# Patient Record
Sex: Male | Born: 1980
Health system: Southern US, Community
[De-identification: ages and names within clinical notes are randomized; demographics above are authoritative.]

## PROBLEM LIST (undated history)

## (undated) DIAGNOSIS — M5020 Other cervical disc displacement, unspecified cervical region: Secondary | ICD-10-CM

## (undated) DIAGNOSIS — E119 Type 2 diabetes mellitus without complications: Secondary | ICD-10-CM

## (undated) DIAGNOSIS — G959 Disease of spinal cord, unspecified: Secondary | ICD-10-CM

## (undated) DIAGNOSIS — L409 Psoriasis, unspecified: Secondary | ICD-10-CM

## (undated) DIAGNOSIS — E782 Mixed hyperlipidemia: Secondary | ICD-10-CM

## (undated) DIAGNOSIS — K219 Gastro-esophageal reflux disease without esophagitis: Secondary | ICD-10-CM

## (undated) DIAGNOSIS — J329 Chronic sinusitis, unspecified: Secondary | ICD-10-CM

## (undated) DIAGNOSIS — I10 Essential (primary) hypertension: Secondary | ICD-10-CM

## (undated) DIAGNOSIS — R519 Headache, unspecified: Secondary | ICD-10-CM

## (undated) DIAGNOSIS — M1990 Unspecified osteoarthritis, unspecified site: Secondary | ICD-10-CM

## (undated) DIAGNOSIS — J189 Pneumonia, unspecified organism: Secondary | ICD-10-CM

## (undated) DIAGNOSIS — A159 Respiratory tuberculosis unspecified: Secondary | ICD-10-CM

## (undated) DIAGNOSIS — F1010 Alcohol abuse, uncomplicated: Secondary | ICD-10-CM

## (undated) DIAGNOSIS — E1165 Type 2 diabetes mellitus with hyperglycemia: Secondary | ICD-10-CM

## (undated) DIAGNOSIS — R51 Headache: Secondary | ICD-10-CM

## (undated) DIAGNOSIS — I152 Hypertension secondary to endocrine disorders: Secondary | ICD-10-CM

## (undated) DIAGNOSIS — E1159 Type 2 diabetes mellitus with other circulatory complications: Secondary | ICD-10-CM

## (undated) HISTORY — DX: Type 2 diabetes mellitus without complications: E11.9

## (undated) HISTORY — PX: NECK SURGERY: SHX720

## (undated) HISTORY — DX: Essential (primary) hypertension: I10

## (undated) HISTORY — PX: CERVICAL SPINE SURGERY: SHX589

---

## 2017-08-26 ENCOUNTER — Ambulatory Visit (INDEPENDENT_AMBULATORY_CARE_PROVIDER_SITE_OTHER): Payer: Medicare Other | Admitting: Family Medicine

## 2017-08-26 ENCOUNTER — Encounter: Payer: Self-pay | Admitting: Family Medicine

## 2017-08-26 VITALS — BP 152/98 | HR 84 | Ht 68.0 in | Wt 213.0 lb

## 2017-08-26 DIAGNOSIS — I10 Essential (primary) hypertension: Secondary | ICD-10-CM | POA: Diagnosis not present

## 2017-08-26 DIAGNOSIS — Z789 Other specified health status: Secondary | ICD-10-CM | POA: Insufficient documentation

## 2017-08-26 DIAGNOSIS — Z9109 Other allergy status, other than to drugs and biological substances: Secondary | ICD-10-CM | POA: Insufficient documentation

## 2017-08-26 DIAGNOSIS — F1599 Other stimulant use, unspecified with unspecified stimulant-induced disorder: Secondary | ICD-10-CM | POA: Diagnosis not present

## 2017-08-26 DIAGNOSIS — G9050 Complex regional pain syndrome I, unspecified: Secondary | ICD-10-CM | POA: Insufficient documentation

## 2017-08-26 DIAGNOSIS — N529 Male erectile dysfunction, unspecified: Secondary | ICD-10-CM | POA: Insufficient documentation

## 2017-08-26 DIAGNOSIS — E669 Obesity, unspecified: Secondary | ICD-10-CM

## 2017-08-26 DIAGNOSIS — H1013 Acute atopic conjunctivitis, bilateral: Secondary | ICD-10-CM

## 2017-08-26 DIAGNOSIS — Z9889 Other specified postprocedural states: Secondary | ICD-10-CM | POA: Insufficient documentation

## 2017-08-26 DIAGNOSIS — E781 Pure hyperglyceridemia: Secondary | ICD-10-CM | POA: Diagnosis not present

## 2017-08-26 DIAGNOSIS — M5416 Radiculopathy, lumbar region: Secondary | ICD-10-CM

## 2017-08-26 DIAGNOSIS — G8929 Other chronic pain: Secondary | ICD-10-CM | POA: Insufficient documentation

## 2017-08-26 DIAGNOSIS — L409 Psoriasis, unspecified: Secondary | ICD-10-CM

## 2017-08-26 DIAGNOSIS — Z791 Long term (current) use of non-steroidal anti-inflammatories (NSAID): Secondary | ICD-10-CM | POA: Insufficient documentation

## 2017-08-26 DIAGNOSIS — L400 Psoriasis vulgaris: Secondary | ICD-10-CM

## 2017-08-26 DIAGNOSIS — J3089 Other allergic rhinitis: Secondary | ICD-10-CM | POA: Insufficient documentation

## 2017-08-26 DIAGNOSIS — F39 Unspecified mood [affective] disorder: Secondary | ICD-10-CM

## 2017-08-26 DIAGNOSIS — F17210 Nicotine dependence, cigarettes, uncomplicated: Secondary | ICD-10-CM

## 2017-08-26 DIAGNOSIS — R5382 Chronic fatigue, unspecified: Secondary | ICD-10-CM

## 2017-08-26 DIAGNOSIS — G4700 Insomnia, unspecified: Secondary | ICD-10-CM | POA: Insufficient documentation

## 2017-08-26 DIAGNOSIS — E1159 Type 2 diabetes mellitus with other circulatory complications: Secondary | ICD-10-CM | POA: Insufficient documentation

## 2017-08-26 DIAGNOSIS — E66811 Obesity, class 1: Secondary | ICD-10-CM | POA: Insufficient documentation

## 2017-08-26 DIAGNOSIS — G63 Polyneuropathy in diseases classified elsewhere: Secondary | ICD-10-CM | POA: Diagnosis not present

## 2017-08-26 DIAGNOSIS — R5383 Other fatigue: Secondary | ICD-10-CM | POA: Insufficient documentation

## 2017-08-26 DIAGNOSIS — J309 Allergic rhinitis, unspecified: Secondary | ICD-10-CM

## 2017-08-26 MED ORDER — FEXOFENADINE HCL 180 MG PO TABS: 180.0000 mg | ORAL_TABLET | Freq: Every day | ORAL | 3 refills | Status: DC

## 2017-08-26 MED ORDER — OLOPATADINE HCL 0.1 % OP SOLN: 1.0000 [drp] | Freq: Two times a day (BID) | OPHTHALMIC | 11 refills | Status: DC

## 2017-08-26 MED ORDER — SILDENAFIL CITRATE 100 MG PO TABS: ORAL_TABLET | ORAL | 1 refills | Status: DC

## 2017-08-26 NOTE — Patient Instructions (Addendum)
-Please check your blood pressure every morning after sitting quietly 1520 minutes and possibly one more time during the day.  Bring that in so I can see what her blood pressures been running at home over the next several weeks so we can go over it.  If it remains above goal of 140/90 or less then you will need medicines next office visit  -Please stop the over-the-counter medicines you are taking to help with your erections please try to wean down off of the excess caffeine as this could be.  Causing exacerbation of your pain symptoms as well as the hyperreflexia etc.  -Today we sent you to dermatology as well as neurosurgery.  If you have not heard from either of them within a week please call and speak with Dorothea Ogle our front desk as he is our referral clerk.  --- I will go over the labs with you in person at our next follow-up office visit in 3 to 4 weeks or so.  I want you to have enough time that you get your blood pressures from home and have adequate amounts of time to monitor that so we know exactly what is running     You can use over-the-counter afrin nasal spray for up to 3 days (NO longer than that) which will help acutely with nasal drainage/ congestion short term.   Those pills you are taking for ED are also probably alpha constrictors and are probably contributing to your sinus problems.  It is very important you try to stop those.  And although we discussed the risks of Viagra I think it is a better option for you.  Also, sterile saline nasal rinses, such as Milta Deiters med or AYR sinus rinses, can be very helpful and should be done twice daily- especially throughout the allergy season.   Remember you should use distilled water or previously boiled water to do this.  Then you may use over-the-counter Flonase 1 spray each nostril twice daily after sinus rinses.  You can do this in addition to taking any Allegra or Claritin or Zyrtec etc. that you may be taking daily.  If your eyes tend to get an  itchy or irritated feeling when your seasonal allergies get bad, you can use Naphcon-A over-the-counter eyedrops as needed     Please realize, EXERCISE IS MEDICINE!  -  American Heart Association [facility]) guidelines for exercise : If you are in good health, without any medical conditions, you should engage in 150 minutes of moderate intensity aerobic activity per week.  This means you should be huffing and puffing throughout your workout.   Engaging in regular exercise will improve brain function and memory, as well as improve mood, boost immune system and help with weight management.  As well as the other, more well-known effects of exercise such as decreasing blood sugar levels, decreasing blood pressure,  and decreasing bad cholesterol levels/ increasing good cholesterol levels.     -  The AHA strongly endorses consumption of a diet that contains a variety of foods from all the food categories with an emphasis on fruits and vegetables; fat-free and low-fat dairy products; cereal and grain products; legumes and nuts; and fish, poultry, and/or extra lean meats.    Excessive food intake, especially of foods high in saturated and trans fats, sugar, and salt, should be avoided.    Adequate water intake of roughly 1/2 of your weight in pounds, should equal the ounces of water per day you should drink.  So for instance, if you're 200 pounds, that would be 100 ounces of water per day.         Mediterranean Diet  Why follow it? Research shows. . Those who follow the Mediterranean diet have a reduced risk of heart disease  . The diet is associated with a reduced incidence of Parkinson's and Alzheimer's diseases . People following the diet may have longer life expectancies and lower rates of chronic diseases  . The Dietary Guidelines for Americans recommends the Mediterranean diet as an eating plan to promote health and prevent disease  What Is the Mediterranean Diet?  . Healthy eating plan based on  typical foods and recipes of Mediterranean-style cooking . The diet is primarily a plant based diet; these foods should make up a majority of meals   Starches - Plant based foods should make up a majority of meals - They are an important sources of vitamins, minerals, energy, antioxidants, and fiber - Choose whole grains, foods high in fiber and minimally processed items  - Typical grain sources include wheat, oats, barley, corn, brown rice, bulgar, farro, millet, polenta, couscous  - Various types of beans include chickpeas, lentils, fava beans, black beans, white beans   Fruits  Veggies - Large quantities of antioxidant rich fruits & veggies; 6 or more servings  - Vegetables can be eaten raw or lightly drizzled with oil and cooked  - Vegetables common to the traditional Mediterranean Diet include: artichokes, arugula, beets, broccoli, brussel sprouts, cabbage, carrots, celery, collard greens, cucumbers, eggplant, kale, leeks, lemons, lettuce, mushrooms, okra, onions, peas, peppers, potatoes, pumpkin, radishes, rutabaga, shallots, spinach, sweet potatoes, turnips, zucchini - Fruits common to the Mediterranean Diet include: apples, apricots, avocados, cherries, clementines, dates, figs, grapefruits, grapes, melons, nectarines, oranges, peaches, pears, pomegranates, strawberries, tangerines  Fats - Replace butter and margarine with healthy oils, such as olive oil, canola oil, and tahini  - Limit nuts to no more than a handful a day  - Nuts include walnuts, almonds, pecans, pistachios, pine nuts  - Limit or avoid candied, honey roasted or heavily salted nuts - Olives are central to the [employer] - can be eaten whole or used in a variety of dishes   Meats Protein - Limiting red meat: no more than a few times a month - When eating red meat: choose lean cuts and keep the portion to the size of deck of cards - Eggs: approx. 0 to 4 times a week  - Fish and lean poultry: at least 2 a week  -  Healthy protein sources include, chicken, [place], lean beef, lamb - Increase intake of seafood such as tuna, salmon, trout, mackerel, shrimp, scallops - Avoid or limit high fat processed meats such as sausage and bacon  Dairy - Include moderate amounts of low fat dairy products  - Focus on healthy dairy such as fat free yogurt, skim milk, low or reduced fat cheese - Limit dairy products higher in fat such as whole or 2% milk, cheese, ice cream  Alcohol - Moderate amounts of red wine is ok  - No more than 5 oz daily for women (all ages) and men older than age 14  - No more than 10 oz of wine daily for men younger than 64  Other - Limit sweets and other desserts  - Use herbs and spices instead of salt to flavor foods  - Herbs and spices common to the traditional Mediterranean Diet include: basil, bay leaves, chives, cloves, cumin, fennel, garlic,  lavender, marjoram, mint, oregano, parsley, pepper, rosemary, sage, savory, sumac, tarragon, thyme   It's not just a diet, it's a lifestyle:  . The Mediterranean diet includes lifestyle factors typical of those in the region  . Foods, drinks and meals are best eaten with others and savored . Daily physical activity is important for overall good health . This could be strenuous exercise like running and aerobics . This could also be more leisurely activities such as walking, housework, yard-work, or taking the stairs . Moderation is the key; a balanced and healthy diet accommodates most foods and drinks . Consider portion sizes and frequency of consumption of certain foods   Meal Ideas & Options:  . Breakfast:  o Whole wheat toast or whole wheat English muffins with peanut butter & hard boiled egg o Steel cut oats topped with apples & cinnamon and skim milk  o Fresh fruit: banana, strawberries, melon, berries, peaches  o Smoothies: strawberries, bananas, greek yogurt, peanut butter o Low fat greek yogurt with blueberries and granola  o Egg white  omelet with spinach and mushrooms o Breakfast couscous: whole wheat couscous, apricots, skim milk, cranberries  . Sandwiches:  o Hummus and grilled vegetables (peppers, zucchini, squash) on whole wheat bread   o Grilled chicken on whole wheat pita with lettuce, tomatoes, cucumbers or tzatziki  o Tuna salad on whole wheat bread: tuna salad made with greek yogurt, olives, red peppers, capers, green onions o Garlic rosemary lamb pita: lamb sauted with garlic, rosemary, salt & pepper; add lettuce, cucumber, greek yogurt to pita - flavor with lemon juice and black pepper  . Seafood:  o Mediterranean grilled salmon, seasoned with garlic, basil, parsley, lemon juice and black pepper o Shrimp, lemon, and spinach whole-grain pasta salad made with low fat greek yogurt  o Seared scallops with lemon orzo  o Seared tuna steaks seasoned salt, pepper, coriander topped with tomato mixture of olives, tomatoes, olive oil, minced garlic, parsley, green onions and cappers  . Meats:  o Herbed greek chicken salad with kalamata olives, cucumber, feta  o Red bell peppers stuffed with spinach, bulgur, lean ground beef (or lentils) & topped with feta   o Kebabs: skewers of chicken, tomatoes, onions, zucchini, squash  o [place] burgers: made with red onions, mint, dill, lemon juice, feta cheese topped with roasted red peppers . Vegetarian o Cucumber salad: cucumbers, artichoke hearts, celery, red onion, feta cheese, tossed in olive oil & lemon juice  o Hummus and whole grain pita points with a greek salad (lettuce, tomato, feta, olives, cucumbers, red onion) o Lentil soup with celery, carrots made with vegetable broth, garlic, salt and pepper  o Tabouli salad: parsley, bulgur, mint, scallions, cucumbers, tomato, radishes, lemon juice, olive oil, salt and pepper.

## 2017-08-26 NOTE — Progress Notes (Signed)
New patient office visit note:  Impression and Recommendations:    1. HTN   2. Plaque psoriasis- onset age 36 has needed Cosentyx in past has failed Enbrel and Humira   3. Psoriasis- onset age 3 has needed Cosentyx in past has failed Enbrel and Humira   4. Chronic radicular low back pain- R sided   5. 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 Neuropathy due to medical condition ([place])   7. RSD (reflex sympathetic dystrophy)- right entire leg painful to non-noxious stimuli   8. Cigarette smoker- 3 ppd for 6 yrs, then 1ppd for 9 yrs- 27 pk yr hx   9. Drinks beer- 6-8 beers/d on ave ( 10 Yrs or so)   10. NSAID long-term use-4 ibuprofen 3 times daily times at least 5 years   11. 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 Chronic fatigue   13. Mood disorder ([place])- secondary to chronic pain syndrome   14. Insomnia, unspecified type- drinks 5-6 beers just to fall asleep and calm nerves   15. Environmental and seasonal allergies   16. Allergic conjunctivitis of both eyes and rhinitis-  failed OTC Naphcon-A and others   17. Erectile dysfunction, unspecified erectile dysfunction type   18. Obesity, Class I, BMI 30-34.9    1. Hypertension - Between now and next OV, ambulatory BP monitoring encouraged.  Keep log and bring in next OV. - If it remains above goal of 140/90 or less, patient will need medicine next visit.  2. Ambulatory Referral to Dermatology - Plaque Psoriasis Plaque psoriasis- onset age 9. - Treatment with Cosentyx in past; has failed Enbrel and Humira . - Currently untreated; no treatment over 3 years.  3. Ambulatory Referral to Spine Specialist - Chronic radicular low back pain - R sided  - History of excision of lamina of cervical vertebra for decompression of sp- C4-6inal cord  - Neuropathy due to medical condition (HCC)  - RSD (reflex sympathetic dystrophy)- right entire leg painful to  non-noxious stimuli   - Encouraged patient to send in records from his old providers, especially as he reports history of surgery, various CT and MRI's obtained in the past.  - Patient knows to ask neurosurgery if seeing a pain specialist would be beneficial for management.  - Patient should wean down off of excess caffeine as this can exacerbate hyperreflexia.  4. Seasonal & Environmental Allergies Allergic conjunctivitis of both eyes and rhinitis-  failed OTC Naphcon-A and others   - Prescription provided today for allergic conjunctivitis.  One drop in the morning and one drop in the evening, in both eyes.  - Continue taking one 24-hour Allegra tablet daily.   - Advised patient to begin Flonase.  5. Erectile Dysfunction - STRONGLY advised patient to discontinue taking assorted OTC "gas station pills" for ED as they are vasoconstrictors and could be exacerbating his other health conditions.  Noted that associated side effects could be adversely affecting his sinuses, causing his dizziness, and possibly other undesirable effects.  - Sildenafil prescribed today.  Risks and benefits of use discussed with the patient at length.  6. General Health Maintenance Obesity, Class I, BMI 30-34.9   Explained to patient what BMI refers to, and what it means medically.    Told patient to think about it as a "medical risk stratification measurement" and how increasing BMI is associated with increasing risk/ or worsening state of  various diseases such as hypertension, hyperlipidemia, diabetes, premature OA, depression etc.  American Heart Association guidelines for healthy diet, basically Mediterranean diet, and exercise guidelines of 30 minutes 5 days per week or more discussed in detail.  Health counseling performed.  All questions answered.  - Advised patient to continue working toward exercising to improve health.    - Patient will begin with 15 minutes of physical activity daily as he can  tolerate.  Recommended that the patient eventually strive for at least 150 minutes of cardio per week according to the [facility].   - Healthy dietary habits encouraged, including low-carb, and high amounts of lean protein in diet.   - Patient should also consume adequate amounts of water - half of body weight in oz of water per day.  Smoking Cessation Counseling Cigarette smoker- 3 ppd for 6 yrs, then 1ppd for 9 yrs- 27 pk yr hx  - Smoking cessation counseling and materials provided today. - Advised patient that smoking has also exacerbated his health problems and stopping is essential.  EtOH Cessation Counseling Drinks beer- 6-8 beers/d on ave (10 years or so)  Drinks 5-6 beers nightly just to fall asleep and calm nerves  - Advised patient on importance of cutting back his alcohol consumption.  Chronic NSAID Use  - NSAID long-term use-4 ibuprofen 3 times daily times at least 5 years   Caffeine Disorder 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  - Advised patient on the importance of weaning down his caffeine use.  Mood - Mood disorder ([place])- secondary to chronic pain syndrome. - Patient knows that he can contact us if he desires additional assistance with his mood.  7. Follow-Up - Several labs ordered today to establish baseline. - Patient will return at his convenience to obtain FBW.  Discuss at next OV in 3-4 weeks.   - If he has not heard from dermatology or neurosurgery in a week, he will call Dorothea Ogle at our front desk for follow-up.   Education and routine counseling performed. Handouts provided.  Orders Placed This Encounter  Procedures  . CBC with Differential/Platelet  . Comprehensive metabolic panel  . Hemoglobin A1c  . TSH  . VITAMIN D 25 Hydroxy (Vit-D Deficiency, Fractures)  . Lipid panel  . T4, free  . B12 and Folate Panel  . Ambulatory referral to Dermatology  . Ambulatory referral to Neurosurgery     Meds ordered this encounter  Medications    . olopatadine (PATANOL) 0.1 % ophthalmic solution    Sig: Place 1 drop into both eyes 2 (two) times daily.    Dispense:  5 mL    Refill:  11  . fexofenadine (ALLEGRA) 180 MG tablet    Sig: Take 1 tablet (180 mg total) by mouth daily.    Dispense:  90 tablet    Refill:  3  . sildenafil (VIAGRA) 100 MG tablet    Sig: 1/2-1 tablet 30 minutes prior to intercourse as needed    Dispense:  30 tablet    Refill:  1    Gross side effects, risk and benefits, and alternatives of medications discussed with patient.  Patient is aware that all medications have potential side effects and we are unable to predict every side effect or drug-drug interaction that may occur.  Expresses verbal understanding and consents to current therapy plan and treatment regimen.  Return for 3-4 wks, f/up BP, new meds- discuss labs.  Please see AVS handed out to patient at the end of  our visit for further patient instructions/ counseling done pertaining to today's office visit.    Note: This document was prepared using Dragon voice recognition software and may include unintentional dictation errors.  This document serves as a record of services personally performed by Mellody Dance, DO. It was created on her behalf by Toni Amend, a trained medical scribe. The creation of this record is based on the scribe's personal observations and the provider's statements to them.   I have reviewed the above medical documentation for accuracy and completeness and I concur.  Mellody Dance 08/26/17 5:27 PM    -------------------------------------------------------------------------------------------------------------------   Subjective:    Chief complaint:   Chief Complaint  Patient presents with  . Establish Care    HPI: Louis Carney is a pleasant 37 y.o. male who presents to [place] at [facility] today to review their medical history with me and establish care.   I asked the patient to  review their chronic problem list with me to ensure everything was updated and accurate.    All recent office visits with other providers, any medical records that patient brought in etc  - I reviewed today.     We asked pt to get [place] their medical records from [facility] providers/ specialists that they had seen within the past 3-5 years- if they are in private practice and/or do not work for [employer], [facility], [place], [place] or [employer] owned practice.  Told them to call their specialists to clarify this if they are not sure.   Patient hasn't seen a doctor in 3-4 years.  Social History Married to wife Brittney who we also see. Is sexually active with wife.  Has three kids, age 21, 64 and 25.  - Tobacco Use - 14 years. Currently smokes one (1) pack per day. Used to smoke 3 ppd for 6-7 years, plus one (1) ppd for about nine years. 27 pack-year history  - EtOH Use Drinks 6-8 beers per day on average for about 10 years. Uses this to help numb his pain, and help with his insomnia.  - Activity Level No exercise - can't walk, can't get up and down the steps. Notes that his pain prevents him from much activity.  - Chronic NSAID Use Takes 4 ibuprofen 3 times daily, for the past 5 years. Takes 2 antacids with them every time too (6 daily).  - Caffeine Use Takes two 5-hour energy shots per day.  Admits mood disorder due to chronic pain syndrome.  Does not know his biological father.  Family History Unspecified alcoholism, cancer, high cholesterol, and high blood pressure.  Surgical History Neck surgery without fusion - maybe spinal decompression. Surgery on C4, C5, and C6.  Past Medical History Never been told he has HLD, DM, or Prediabetes.  Has been told he has HTN "every time he visits the doctor."  Patient notes that he has chronic fatigue.  Has been on disability for 2 years due to severe back pain.  - Chronic Back Pain & Associated Neuropathy  Lower back, all  right-sided. Radiates down right leg.  Right entire leg painful to non-noxious stimuli. Starts at his buttock and radiates down to his toes. Patient notes bilateral foot numbness but emphasizes the numbness in his right lower extremity. Notes numbness also in ring and pinky fingers bilaterally.   - History of Spinal Decompression Saw neurology in the past.  3 years ago did CT's, MRI's. Has been to pain management, did cortisone shots. Had  neck surgery on C4, C5, and C6.  - Psoriasis Onset at age 72.  Failed Enbrel and Humira. Notes that cosentyx worked until he lost his job due to his back, and then lost his insurance.  Psoriasis won't stop - currently untreated for 3 years.  - Mood Disorder Secondary to chronic pain.  - Insomnia Sleeps on the couch on his side because he can't sleep with his wife or he'd keep her awake.  - Seasonal & Environmental Allergies Notes that his allergies are bad.  Takes nothing for this. Tried Zyrtec, Claritin, Benadryl.  "You name it, I've tried it; nothing works." Currently taking Allegra. Failed eye allergy drops OTC.  - Sexual Performance Issues Secondary to Back/Nerve Pain Gets sexual performance pills at the gas station. Alpha Man, Spanish Fly, etc., cannot remember all the names.  Notes that he has some dizziness about an hour after taking these tablets. Also notes that these pills "really aggravate his sinuses."   Wt Readings from Last 3 Encounters:  08/26/17 213 lb (96.6 kg)   BP Readings from Last 3 Encounters:  08/26/17 (!) 152/98   Pulse Readings from Last 3 Encounters:  08/26/17 84   BMI Readings from Last 3 Encounters:  08/26/17 32.39 kg/m    Patient Care Team    Relationship Specialty Notifications Start End  Mellody Dance, DO PCP - General Family Medicine  08/26/17     Patient Active Problem List   Diagnosis Date Noted  . Psoriasis-onset age 47 has needed Cosentyx in past has failed Enbrel and Humira 08/26/2017   . Chronic radicular low back pain- R sided 08/26/2017  . History of excision of lamina of cervical vertebra for decompression of sp- C4-6inal cord 08/26/2017  . 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 RSD (reflex sympathetic dystrophy)- right entire leg painful to non-noxious stimuli 08/26/2017  . Cigarette smoker- 3 ppd for 6 yrs, then 1ppd for 9 yrs- 27 pk yr hx 08/26/2017  . Drinks beer- 6-8 beers/d on ave ( 10 Yrs or so) 08/26/2017  . HTN 08/26/2017  . NSAID long-term use-4 ibuprofen 3 times daily times at least 5 years 08/26/2017  . 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 Chronic fatigue 08/26/2017  . Mood disorder ([place])- secondary to chronic pain syndrome 08/26/2017  . Insomnia 08/26/2017  . Environmental and seasonal allergies 08/26/2017  . ED (erectile dysfunction) 08/26/2017  . Allergic conjunctivitis of both eyes and rhinitis 08/26/2017  . Obesity, Class I, BMI 30-34.9 08/26/2017  . Plaque psoriasis 08/26/2017     Past Medical History:  Diagnosis Date  . Hypertension      Past Medical History:  Diagnosis Date  . Hypertension      Family History  Problem Relation Age of Onset  . Cancer Mother        LUNG  . Alcohol abuse Mother   . Hypertension Mother   . Alcohol abuse Father   . Hypertension Father      Social History   Substance and Sexual Activity  Drug Use Never     Social History   Substance and Sexual Activity  Alcohol Use Yes  . Alcohol/week: 14.4 oz  . Types: 24 Standard drinks or equivalent per week     Social History   Tobacco Use  Smoking Status Current Every Day Smoker  . Packs/day: 1.00  . Years: 15.00  . Pack years: 15.00  . Types: Cigarettes  Smokeless  Tobacco Never Used     Current Meds  Medication Sig  . Cimetidine (EQ ACID REDUCER PO) Take 6 tablets by mouth 3 (three) times daily.  Marland Kitchen ibuprofen (ADVIL,MOTRIN) 200 MG  tablet Take 800 mg by mouth 3 (three) times daily.    Allergies: Patient has no known allergies.   Review of Systems  Constitutional: Positive for malaise/fatigue (chronic). Negative for chills, diaphoresis, fever and weight loss.  HENT: Negative for congestion, sore throat and tinnitus.   Eyes: Negative for blurred vision, double vision and photophobia.  Respiratory: Positive for cough (chronic) and wheezing (chronic).   Cardiovascular: Negative for chest pain and palpitations.  Gastrointestinal: Positive for heartburn (acid reflux, chronic). Negative for blood in stool, diarrhea, nausea and vomiting.  Genitourinary: Negative for dysuria, frequency and urgency.  Musculoskeletal: Positive for back pain (chronic), joint pain (chronic) and myalgias (chronic).  Skin: Positive for rash (chronic, psoriasis). Negative for itching.       Chronic changes due to psoriasis  Neurological: Positive for headaches (chronic). Negative for dizziness, focal weakness and weakness.  Endo/Heme/Allergies: Negative for environmental allergies and polydipsia. Does not bruise/bleed easily.  Psychiatric/Behavioral: Positive for depression (chronic). Negative for memory loss. The patient has insomnia (chronic). The patient is not nervous/anxious.     Objective:   Blood pressure (!) 152/98, pulse 84, height 5\' 8"  (1.727 m), weight 213 lb (96.6 kg), SpO2 98 %. Body mass index is 32.39 kg/m. General: Well Developed, well nourished, and in no acute distress.  Neuro: Alert and oriented x3, extra-ocular muscles intact, sensation grossly intact.  HEENT:[place]/AT, PERRLA, neck supple, No carotid bruits Skin: Patches of thickened lichenification of the skin on extensor surfaces of knees, elbows, even large patches on thighs, back, chest, and small patches along periorbital regions. Cardiac: Regular rate and rhythm Respiratory: Essentially clear to auscultation bilaterally. Not using accessory muscles, speaking in full  sentences.  Abdominal: not grossly distended Musculoskeletal: Ambulates w/o diff, FROM * 4 ext.  Hyper reflexive bilaterally more so on the right.  Causalgia and hyperalgesia right LE. Vasc: less 2 sec cap RF, warm and pink  Psych:  No HI/SI, judgement and insight good, Euthymic mood. Full Affect.   No results found for this or any previous visit (from the past 2160 hour(s)).

## 2017-08-27 LAB — CBC WITH DIFFERENTIAL/PLATELET
BASOS: 1 %
Basophils Absolute: 0 10*3/uL (ref 0.0–0.2)
EOS (ABSOLUTE): 0.3 10*3/uL (ref 0.0–0.4)
EOS: 3 %
HEMATOCRIT: 45.7 % (ref 37.5–51.0)
HEMOGLOBIN: 16.4 g/dL (ref 13.0–17.7)
IMMATURE GRANS (ABS): 0 10*3/uL (ref 0.0–0.1)
IMMATURE GRANULOCYTES: 0 %
LYMPHS: 31 %
Lymphocytes Absolute: 2.6 10*3/uL (ref 0.7–3.1)
MCH: 32.5 pg (ref 26.6–33.0)
MCHC: 35.9 g/dL — ABNORMAL HIGH (ref 31.5–35.7)
MCV: 91 fL (ref 79–97)
MONOCYTES: 8 %
Monocytes Absolute: 0.7 10*3/uL (ref 0.1–0.9)
NEUTROS PCT: 57 %
Neutrophils Absolute: 5 10*3/uL (ref 1.4–7.0)
PLATELETS: 279 10*3/uL (ref 150–379)
RBC: 5.04 x10E6/uL (ref 4.14–5.80)
RDW: 12.8 % (ref 12.3–15.4)
WBC: 8.6 10*3/uL (ref 3.4–10.8)

## 2017-08-27 LAB — COMPREHENSIVE METABOLIC PANEL
A/G RATIO: 1.7 (ref 1.2–2.2)
ALBUMIN: 4.6 g/dL (ref 3.5–5.5)
ALT: 37 IU/L (ref 0–44)
AST: 28 IU/L (ref 0–40)
Alkaline Phosphatase: 48 IU/L (ref 39–117)
BILIRUBIN TOTAL: 0.3 mg/dL (ref 0.0–1.2)
BUN / CREAT RATIO: 15 (ref 9–20)
BUN: 10 mg/dL (ref 6–20)
CALCIUM: 9.9 mg/dL (ref 8.7–10.2)
CHLORIDE: 100 mmol/L (ref 96–106)
CO2: 22 mmol/L (ref 20–29)
Creatinine, Ser: 0.68 mg/dL — ABNORMAL LOW (ref 0.76–1.27)
GFR, EST AFRICAN AMERICAN: 141 mL/min/{1.73_m2} (ref >59)
GFR, EST NON AFRICAN AMERICAN: 122 mL/min/{1.73_m2} (ref >59)
GLOBULIN, TOTAL: 2.7 g/dL (ref 1.5–4.5)
Glucose: 86 mg/dL (ref 65–99)
POTASSIUM: 4.7 mmol/L (ref 3.5–5.2)
Sodium: 140 mmol/L (ref 134–144)
TOTAL PROTEIN: 7.3 g/dL (ref 6.0–8.5)

## 2017-08-27 LAB — HEMOGLOBIN A1C
Est. average glucose Bld gHb Est-mCnc: 105 mg/dL
Hgb A1c MFr Bld: 5.3 % (ref 4.8–5.6)

## 2017-08-27 LAB — LIPID PANEL
CHOL/HDL RATIO: 9.2 ratio — AB (ref 0.0–5.0)
Cholesterol, Total: 267 mg/dL — ABNORMAL HIGH (ref 100–199)
HDL: 29 mg/dL — AB (ref >39)
Triglycerides: 845 mg/dL (ref 0–149)

## 2017-08-27 LAB — VITAMIN D 25 HYDROXY (VIT D DEFICIENCY, FRACTURES): Vit D, 25-Hydroxy: 21.3 ng/mL — ABNORMAL LOW (ref 30.0–100.0)

## 2017-08-27 LAB — T4, FREE: Free T4: 0.88 ng/dL (ref 0.82–1.77)

## 2017-08-27 LAB — B12 AND FOLATE PANEL
Folate: 5.7 ng/mL (ref >3.0)
Vitamin B-12: 1023 pg/mL (ref 232–1245)

## 2017-08-27 LAB — TSH: TSH: 1.35 u[IU]/mL (ref 0.450–4.500)

## 2017-08-28 ENCOUNTER — Telehealth: Payer: Self-pay | Admitting: Family Medicine

## 2017-08-28 ENCOUNTER — Other Ambulatory Visit: Payer: Self-pay

## 2017-08-28 DIAGNOSIS — E781 Pure hyperglyceridemia: Secondary | ICD-10-CM

## 2017-08-28 NOTE — Telephone Encounter (Signed)
Pt informed of results.  Pt expressed understanding and is agreeable.  T. Kelsay Haggard, CMA 

## 2017-08-28 NOTE — Telephone Encounter (Signed)
Patient states someone called him left message to call office.  --Forwarding message to medical assistant to contact patient w/ lab results @ [phone].  --glh

## 2017-09-02 LAB — SPECIMEN STATUS REPORT

## 2017-09-02 LAB — LDL CHOLESTEROL, DIRECT: LDL DIRECT: 137 mg/dL — AB (ref 0–99)

## 2017-09-12 ENCOUNTER — Encounter: Payer: Self-pay | Admitting: Family Medicine

## 2017-09-12 ENCOUNTER — Ambulatory Visit (INDEPENDENT_AMBULATORY_CARE_PROVIDER_SITE_OTHER): Payer: Medicare Other | Admitting: Family Medicine

## 2017-09-12 VITALS — BP 159/94 | HR 79 | Ht 68.0 in | Wt 231.0 lb

## 2017-09-12 DIAGNOSIS — E7800 Pure hypercholesterolemia, unspecified: Secondary | ICD-10-CM

## 2017-09-12 DIAGNOSIS — E669 Obesity, unspecified: Secondary | ICD-10-CM | POA: Diagnosis not present

## 2017-09-12 DIAGNOSIS — F1599 Other stimulant use, unspecified with unspecified stimulant-induced disorder: Secondary | ICD-10-CM

## 2017-09-12 DIAGNOSIS — F39 Unspecified mood [affective] disorder: Secondary | ICD-10-CM

## 2017-09-12 DIAGNOSIS — R5382 Chronic fatigue, unspecified: Secondary | ICD-10-CM

## 2017-09-12 DIAGNOSIS — E786 Lipoprotein deficiency: Secondary | ICD-10-CM | POA: Insufficient documentation

## 2017-09-12 DIAGNOSIS — E781 Pure hyperglyceridemia: Secondary | ICD-10-CM | POA: Diagnosis not present

## 2017-09-12 DIAGNOSIS — E559 Vitamin D deficiency, unspecified: Secondary | ICD-10-CM

## 2017-09-12 DIAGNOSIS — Z833 Family history of diabetes mellitus: Secondary | ICD-10-CM | POA: Insufficient documentation

## 2017-09-12 DIAGNOSIS — Z79899 Other long term (current) drug therapy: Secondary | ICD-10-CM | POA: Diagnosis not present

## 2017-09-12 DIAGNOSIS — F17210 Nicotine dependence, cigarettes, uncomplicated: Secondary | ICD-10-CM | POA: Diagnosis not present

## 2017-09-12 DIAGNOSIS — I10 Essential (primary) hypertension: Secondary | ICD-10-CM

## 2017-09-12 DIAGNOSIS — E1169 Type 2 diabetes mellitus with other specified complication: Secondary | ICD-10-CM | POA: Insufficient documentation

## 2017-09-12 DIAGNOSIS — E785 Hyperlipidemia, unspecified: Secondary | ICD-10-CM | POA: Insufficient documentation

## 2017-09-12 MED ORDER — VITAMIN D (ERGOCALCIFEROL) 1.25 MG (50000 UNIT) PO CAPS: ORAL_CAPSULE | ORAL | 10 refills | Status: DC

## 2017-09-12 MED ORDER — OMEGA-3-ACID ETHYL ESTERS 1 G PO CAPS: 2.0000 g | ORAL_CAPSULE | Freq: Two times a day (BID) | ORAL | 3 refills | Status: DC

## 2017-09-12 MED ORDER — OLMESARTAN-AMLODIPINE-HCTZ 20-5-12.5 MG PO TABS: ORAL_TABLET | ORAL | 0 refills | Status: DC

## 2017-09-12 MED ORDER — ATORVASTATIN CALCIUM 80 MG PO TABS: 80.0000 mg | ORAL_TABLET | Freq: Every day | ORAL | 3 refills | Status: DC

## 2017-09-12 NOTE — Patient Instructions (Signed)
Please understand Louis Carney that you cannot continue to take the energy drinks, use over-the-counter erectile dysfunction meds and drink as much caffeine as you drink and think you are going to feel well.  It is extremely important you eat better, do not put any foreign substances in your body and try to move more and exercise daily.  You are very physically active around the house but it is important that you do things for your heart and cardiovascular health and well-being.    Please realize, EXERCISE IS MEDICINE!  -  American Heart Association [facility]) guidelines for exercise : If you are in good health, without any medical conditions, you should engage in 150 minutes of moderate intensity aerobic activity per week.  This means you should be huffing and puffing throughout your workout.   Engaging in regular exercise will improve brain function and memory, as well as improve mood, boost immune system and help with weight management.  As well as the other, more well-known effects of exercise such as decreasing blood sugar levels, decreasing blood pressure,  and decreasing bad cholesterol levels/ increasing good cholesterol levels.     -  The AHA strongly endorses consumption of a diet that contains a variety of foods from all the food categories with an emphasis on fruits and vegetables; fat-free and low-fat dairy products; cereal and grain products; legumes and nuts; and fish, poultry, and/or extra lean meats.    Excessive food intake, especially of foods high in saturated and trans fats, sugar, and salt, should be avoided.    Adequate water intake of roughly 1/2 of your weight in pounds, should equal the ounces of water per day you should drink.  So for instance, if you're 200 pounds, that would be 100 ounces of water per day.         Mediterranean Diet  Why follow it? Research shows. . Those who follow the Mediterranean diet have a reduced risk of heart disease  . The diet is associated with a  reduced incidence of Parkinson's and Alzheimer's diseases . People following the diet may have longer life expectancies and lower rates of chronic diseases  . The Dietary Guidelines for Americans recommends the Mediterranean diet as an eating plan to promote health and prevent disease  What Is the Mediterranean Diet?  . Healthy eating plan based on typical foods and recipes of Mediterranean-style cooking . The diet is primarily a plant based diet; these foods should make up a majority of meals   Starches - Plant based foods should make up a majority of meals - They are an important sources of vitamins, minerals, energy, antioxidants, and fiber - Choose whole grains, foods high in fiber and minimally processed items  - Typical grain sources include wheat, oats, barley, corn, brown rice, bulgar, farro, millet, polenta, couscous  - Various types of beans include chickpeas, lentils, fava beans, black beans, white beans   Fruits  Veggies - Large quantities of antioxidant rich fruits & veggies; 6 or more servings  - Vegetables can be eaten raw or lightly drizzled with oil and cooked  - Vegetables common to the traditional Mediterranean Diet include: artichokes, arugula, beets, broccoli, brussel sprouts, cabbage, carrots, celery, collard greens, cucumbers, eggplant, kale, leeks, lemons, lettuce, mushrooms, okra, onions, peas, peppers, potatoes, pumpkin, radishes, rutabaga, shallots, spinach, sweet potatoes, turnips, zucchini - Fruits common to the Mediterranean Diet include: apples, apricots, avocados, cherries, clementines, dates, figs, grapefruits, grapes, melons, nectarines, oranges, peaches, pears, pomegranates, strawberries, tangerines  Fats -  Replace butter and margarine with healthy oils, such as olive oil, canola oil, and tahini  - Limit nuts to no more than a handful a day  - Nuts include walnuts, almonds, pecans, pistachios, pine nuts  - Limit or avoid candied, honey roasted or heavily salted  nuts - Olives are central to the Mediterranean diet - can be eaten whole or used in a variety of dishes   Meats Protein - Limiting red meat: no more than a few times a month - When eating red meat: choose lean cuts and keep the portion to the size of deck of cards - Eggs: approx. 0 to 4 times a week  - Fish and lean poultry: at least 2 a week  - Healthy protein sources include, chicken, [place], lean beef, lamb - Increase intake of seafood such as tuna, salmon, trout, mackerel, shrimp, scallops - Avoid or limit high fat processed meats such as sausage and bacon  Dairy - Include moderate amounts of low fat dairy products  - Focus on healthy dairy such as fat free yogurt, skim milk, low or reduced fat cheese - Limit dairy products higher in fat such as whole or 2% milk, cheese, ice cream  Alcohol - Moderate amounts of red wine is ok  - No more than 5 oz daily for women (all ages) and men older than age 54  - No more than 10 oz of wine daily for men younger than 40  Other - Limit sweets and other desserts  - Use herbs and spices instead of salt to flavor foods  - Herbs and spices common to the traditional Mediterranean Diet include: basil, bay leaves, chives, cloves, cumin, fennel, garlic, lavender, marjoram, mint, oregano, parsley, pepper, rosemary, sage, savory, sumac, tarragon, thyme   It's not just a diet, it's a lifestyle:  . The Mediterranean diet includes lifestyle factors typical of those in the region  . Foods, drinks and meals are best eaten with others and savored . Daily physical activity is important for overall good health . This could be strenuous exercise like running and aerobics . This could also be more leisurely activities such as walking, housework, yard-work, or taking the stairs . Moderation is the key; a balanced and healthy diet accommodates most foods and drinks . Consider portion sizes and frequency of consumption of certain foods   Meal Ideas & Options:   . Breakfast:  o Whole wheat toast or whole wheat English muffins with peanut butter & hard boiled egg o Steel cut oats topped with apples & cinnamon and skim milk  o Fresh fruit: banana, strawberries, melon, berries, peaches  o Smoothies: strawberries, bananas, greek yogurt, peanut butter o Low fat greek yogurt with blueberries and granola  o Egg white omelet with spinach and mushrooms o Breakfast couscous: whole wheat couscous, apricots, skim milk, cranberries  . Sandwiches:  o Hummus and grilled vegetables (peppers, zucchini, squash) on whole wheat bread   o Grilled chicken on whole wheat pita with lettuce, tomatoes, cucumbers or tzatziki  o Tuna salad on whole wheat bread: tuna salad made with greek yogurt, olives, red peppers, capers, green onions o Garlic rosemary lamb pita: lamb sauted with garlic, rosemary, salt & pepper; add lettuce, cucumber, greek yogurt to pita - flavor with lemon juice and black pepper  . Seafood:  o Mediterranean grilled salmon, seasoned with garlic, basil, parsley, lemon juice and black pepper o Shrimp, lemon, and spinach whole-grain pasta salad made with low fat greek yogurt  o  Seared scallops with lemon orzo  o Seared tuna steaks seasoned salt, pepper, coriander topped with tomato mixture of olives, tomatoes, olive oil, minced garlic, parsley, green onions and cappers  . Meats:  o Herbed greek chicken salad with kalamata olives, cucumber, feta  o Red bell peppers stuffed with spinach, bulgur, lean ground beef (or lentils) & topped with feta   o Kebabs: skewers of chicken, tomatoes, onions, zucchini, squash  o [place] burgers: made with red onions, mint, dill, lemon juice, feta cheese topped with roasted red peppers . Vegetarian o Cucumber salad: cucumbers, artichoke hearts, celery, red onion, feta cheese, tossed in olive oil & lemon juice  o Hummus and whole grain pita points with a greek salad (lettuce, tomato, feta, olives, cucumbers, red onion) o Lentil  soup with celery, carrots made with vegetable broth, garlic, salt and pepper  o Tabouli salad: parsley, bulgur, mint, scallions, cucumbers, tomato, radishes, lemon juice, olive oil, salt and pepper.      You can use over-the-counter afrin nasal spray for up to 3 days (NO longer than that) which will help acutely with nasal drainage/ congestion short term.     Also, sterile saline nasal rinses, such as Milta Deiters med or AYR sinus rinses, can be very helpful and should be done twice daily- especially throughout the allergy season.   Remember you should use distilled water or previously boiled water to do this.  Then you may use over-the-counter Flonase 1 spray each nostril twice daily after sinus rinses.  You can do this in addition to taking any Allegra or Claritin or Zyrtec etc. that you may be taking daily.  If your eyes tend to get an itchy or irritated feeling when your seasonal allergies get bad, you can use Naphcon-A over-the-counter eyedrops as needed

## 2017-09-12 NOTE — Progress Notes (Signed)
Assessment and plan:  1. HTN   2. Cigarette smoker- 3 ppd for 6 yrs, then 1ppd for 9 yrs- 27 pk yr hx   3. Mood disorder ([name])- secondary to chronic pain syndrome   4. Obesity, Class I, BMI 30-34.9   5. Chronic fatigue   6. 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 Hypertriglyceridemia   8. Low level of high density lipoprotein (HDL)   9. Elevated LDL cholesterol level   10. Vitamin D deficiency   11. High risk medication use   12. Family history of diabetes mellitus in father     73. HTN -Start amlodipine. BP is elevated and not at goal.  -check your BP at home and keep a log. Bring this into next OV.   2. Smoking cessation -Encouraged pt to stop smoking. Pt feels ready to quit at this time.   3. Mood -Pt is requesting referral to psychiatry. Referral given.  4. Obesity -Prudent diet and exercise discussed. Reduce intake of fast food.  -encouraged pt to change his lifestyle through health diet and exercise. Pt is defiant and resistant to change, especially his diet.  -discussed using the Lose It or My Fitness Pal apps.  -Referral given to nutritionist.  5. Caffeine -reduce intake of caffeinated beverages, especially 5 hour energy drinks.  -stop spanish fly.   6. HLD/HTG/low HDL -Start lipitor and lovaza.  -reduce intake of saturated and trans fats, and fatty carbohydrates. Handouts and information provided. -Encouraged pt to exercise regularly. AHA exercise and dietary guidelines discussed.   7. Vit D deficiency/Chronic fatigue -Start supplements.   8. High risk medicines -will monitor labs   -Stop spanish fly. Restart viagra.  -use Neti pot or AYR sinus rinses, followed by 1 spray of flonase in each nostril.   -Continue drinking adequate amounts of water per day, equal to half of your wt in oz.      Education and routine counseling performed. Handouts provided.  Orders  Placed This Encounter  Procedures  . ALT  . Ambulatory referral to Psychiatry  . Amb Referral to Nutrition and Diabetic E    Meds ordered this encounter  Medications  . atorvastatin (LIPITOR) 80 MG tablet    Sig: Take 1 tablet (80 mg total) by mouth at bedtime.    Dispense:  90 tablet    Refill:  3  . omega-3 acid ethyl esters (LOVAZA) 1 g capsule    Sig: Take 2 capsules (2 g total) by mouth 2 (two) times daily.    Dispense:  180 capsule    Refill:  3  . Vitamin D, Ergocalciferol, (DRISDOL) 50000 units CAPS capsule    Sig: Take 1 tab Wednesday and Sunday each week.    Dispense:  24 capsule    Refill:  10  . Olmesartan-amLODIPine-HCTZ 20-5-12.5 MG TABS    Sig: Start with one half tab daily for 4 days then increase to 1 tab daily    Dispense:  90 tablet    Refill:  0     Return for Follow-up 6 weeks-started BP meds, Lipitor and Lovaza, needs lab- ALT.   Anticipatory guidance and routine counseling done re: condition, txmnt options and need for follow up. All questions of patient's were answered.   Gross side effects, risk and benefits, and alternatives of medications discussed with patient.  Patient is aware that all medications have potential side effects and we are unable to predict every sideeffect  or drug-drug interaction that may occur.  Expresses verbal understanding and consents to current therapy plan and treatment regiment.  Please see AVS handed out to patient at the end of our visit for additional patient instructions/ counseling done pertaining to today's office visit.  Note: This document was prepared using Dragon voice recognition software and may include unintentional dictation errors.  Pt was in the office today for 36+ minutes, with over 50% time spent in face to face counseling of patients various medical conditions, treatment plans of those medical conditions including medicine management and lifestyle modification, strategies to improve health and well being;  and in coordination of care. SEE ABOVE FOR DETAILS  This document serves as a record of services personally performed by Louis Dance, DO. It was created on her behalf by Louis Carney, a trained medical scribe. The creation of this record is based on the scribe's personal observations and the provider's statements to them.   I have reviewed the above medical documentation for accuracy and completeness and I concur.  Louis Carney 09/12/17 9:18 AM   ----------------------------------------------------------------------------------------------------------------------  Subjective:   CC:   Louis Carney is a 37 y.o. male who presents to [place] [facility] today for review and discussion of recent bloodwork that was done.  1. All recent blood work that we ordered was reviewed with patient today.  Patient was counseled on all abnormalities and we discussed dietary and lifestyle changes that could help those values (also medications when appropriate).  Extensive health counseling performed and all patient's concerns/ questions were addressed.   He reports his father has DM2 and HLD.  Mood Per pt, he has self-diagnosed bipolar and has mood changes regularly. He will have highs and lows frequently. He has never been evaluated for this and never been on meds. He is requesting a referral to psychiatry   Leaky eyes He states he has discharge in his eyes. He has not been using patanol q 12 hours and he cannot put these drops in his eyes.   ED He has been using [employer], natural substances, for his erectile dysfunction. He states this helps his ED symptoms, but "mess his sinuses up".   Diet/exercise He eats a lot of fast foods, burgers, bratwursts, etc. But he only eats once a day. He cannot walk long distances due to a chronic back injury. Per his wife, he is active throughout the day but does not exercise regularly.   He drinks a lot of juice and a fair amount of  water. He does not drink soda.  Vit D He states one week he took 2 of his wife's ergocalciferol which helped his energy and mood tremendously. Otherwise he has not been taking any supplements.  Back He has not been able to schedule an appointment with neurology and needs xrays of his back. He has a previous back injury.  He takes 12 ibuprofen tablets a day for many years.   HTN HPI:  -  His blood pressure has not been controlled at home.  Pt is checking it at home.   - Patient reports good compliance with blood pressure medications  Bps at home have been 154/110.   - Denies medication S-E   - Smoking Status noted- he smokes 0.7-1 ppd.   - He denies new onset of: chest pain, exercise intolerance, shortness of breath, dizziness, visual changes, headache, lower extremity swelling or claudication.  Last 3 blood pressure readings in our office are as follows: BP  Readings from Last 3 Encounters:  09/12/17 (!) 160/100  08/26/17 (!) 152/98    Filed Weights   09/12/17 0819  Weight: 231 lb (104.8 kg)    Wt Readings from Last 3 Encounters:  09/12/17 231 lb (104.8 kg)  08/26/17 213 lb (96.6 kg)   BP Readings from Last 3 Encounters:  09/12/17 (!) 160/100  08/26/17 (!) 152/98   Pulse Readings from Last 3 Encounters:  09/12/17 79  08/26/17 84   BMI Readings from Last 3 Encounters:  09/12/17 35.12 kg/m  08/26/17 32.39 kg/m     Patient Care Team    Relationship Specialty Notifications Start End  Louis Dance, DO PCP - General Family Medicine  08/26/17     Full medical history updated and reviewed in the office today  Patient Active Problem List   Diagnosis Date Noted  . Cigarette smoker- 3 ppd for 6 yrs, then 1ppd for 9 yrs- 27 pk yr hx 08/26/2017    Priority: High  . HTN 08/26/2017    Priority: High  . NSAID long-term use-4 ibuprofen 3 times daily times at least 5 years 08/26/2017    Priority: Medium  . Mood disorder (Louis Carney)- secondary to chronic pain syndrome  08/26/2017    Priority: Medium  . Family history of diabetes mellitus in father 09/12/2017  . High risk medication use 09/12/2017  . Vitamin D deficiency 09/12/2017  . Elevated LDL cholesterol level 09/12/2017  . Low level of high density lipoprotein (HDL) 09/12/2017  . Hypertriglyceridemia 09/12/2017  . Psoriasis-onset age 8 has needed Cosentyx in past has failed Enbrel and Humira 08/26/2017  . Chronic radicular low back pain- R sided 08/26/2017  . History of excision of lamina of cervical vertebra for decompression of sp- C4-6inal cord 08/26/2017  . 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 RSD (reflex sympathetic dystrophy)- right entire leg painful to non-noxious stimuli 08/26/2017  . Drinks beer- 6-8 beers/d on ave ( 10 Yrs or so) 08/26/2017  . 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 Chronic fatigue 08/26/2017  . Insomnia 08/26/2017  . Environmental and seasonal allergies 08/26/2017  . ED (erectile dysfunction) 08/26/2017  . Allergic conjunctivitis of both eyes and rhinitis 08/26/2017  . Obesity, Class I, BMI 30-34.9 08/26/2017  . Plaque psoriasis 08/26/2017    Past Medical History:  Diagnosis Date  . Hypertension      Social History   Tobacco Use  . Smoking status: Current Every Day Smoker    Packs/day: 1.00    Years: 15.00    Pack years: 15.00    Types: Cigarettes  . Smokeless tobacco: Never Used  Substance Use Topics  . Alcohol use: Yes    Alcohol/week: 14.4 oz    Types: 24 Standard drinks or equivalent per week    Family Hx: Family History  Problem Relation Age of Onset  . Cancer Mother        LUNG  . Alcohol abuse Mother   . Hypertension Mother   . Alcohol abuse Father   . Hypertension Father      Medications: Current Outpatient Medications  Medication Sig Dispense Refill  . Cimetidine (EQ ACID REDUCER PO) Take 6 tablets by mouth 3 (three) times  daily.    . fexofenadine (ALLEGRA) 180 MG tablet Take 1 tablet (180 mg total) by mouth daily. 90 tablet 3  . ibuprofen (ADVIL,MOTRIN) 200 MG tablet Take 800 mg by mouth 3 (three) times daily.    Marland Kitchen  olopatadine (PATANOL) 0.1 % ophthalmic solution Place 1 drop into both eyes 2 (two) times daily. 5 mL 11  . atorvastatin (LIPITOR) 80 MG tablet Take 1 tablet (80 mg total) by mouth at bedtime. 90 tablet 3  . Olmesartan-amLODIPine-HCTZ 20-5-12.5 MG TABS Start with one half tab daily for 4 days then increase to 1 tab daily 90 tablet 0  . omega-3 acid ethyl esters (LOVAZA) 1 g capsule Take 2 capsules (2 g total) by mouth 2 (two) times daily. 180 capsule 3  . Vitamin D, Ergocalciferol, (DRISDOL) 50000 units CAPS capsule Take 1 tab Wednesday and Sunday each week. 24 capsule 10   No current facility-administered medications for this visit.     Allergies:  No Known Allergies   Review of Systems: General:   No F/C, wt loss Pulm:   No DIB, SOB, pleuritic chest pain Card:  No CP, palpitations Abd:  No n/v/d or pain Ext:  No inc edema from baseline  Objective:  Blood pressure (!) 160/100, pulse 79, height 5\' 8"  (1.727 m), weight 231 lb (104.8 kg), SpO2 97 %. Body mass index is 35.12 kg/m. Gen:   Well NAD, A and O *3 HEENT:    [place]/AT, EOMI,  MMM Lungs:   Normal work of breathing. CTA B/L, no Wh, rhonchi Heart:   RRR, S1, S2 WNL's, no MRG Abd:   No gross distention Exts:    warm, pink,  Brisk capillary refill, warm and well perfused.  Psych:    No HI/SI, judgement and insight good, Euthymic mood. Full Affect.   Recent Results (from the past 2160 hour(s))  CBC with Differential/Platelet     Status: Abnormal   Collection Time: 08/26/17 12:18 PM  Result Value Ref Range   WBC 8.6 3.4 - 10.8 x10E3/uL   RBC 5.04 4.14 - 5.80 x10E6/uL   Hemoglobin 16.4 13.0 - 17.7 g/dL   Hematocrit 45.7 37.5 - 51.0 %   MCV 91 79 - 97 fL   MCH 32.5 26.6 - 33.0 pg   MCHC 35.9 (H) 31.5 - 35.7 g/dL   RDW 12.8 12.3 -  15.4 %   Platelets 279 150 - 379 x10E3/uL   Neutrophils 57 Not Estab. %   Lymphs 31 Not Estab. %   Monocytes 8 Not Estab. %   Eos 3 Not Estab. %   Basos 1 Not Estab. %   Neutrophils Absolute 5.0 1.4 - 7.0 x10E3/uL   Lymphocytes Absolute 2.6 0.7 - 3.1 x10E3/uL   Monocytes Absolute 0.7 0.1 - 0.9 x10E3/uL   EOS (ABSOLUTE) 0.3 0.0 - 0.4 x10E3/uL   Basophils Absolute 0.0 0.0 - 0.2 x10E3/uL   Immature Granulocytes 0 Not Estab. %   Immature Grans (Abs) 0.0 0.0 - 0.1 x10E3/uL  Comprehensive metabolic panel     Status: Abnormal   Collection Time: 08/26/17 12:18 PM  Result Value Ref Range   Glucose 86 65 - 99 mg/dL   BUN 10 6 - 20 mg/dL   Creatinine, Ser 0.68 (L) 0.76 - 1.27 mg/dL   GFR calc non Af Amer 122 >59 mL/min/1.73   GFR calc Af Amer 141 >59 mL/min/1.73   BUN/Creatinine Ratio 15 9 - 20   Sodium 140 134 - 144 mmol/L   Potassium 4.7 3.5 - 5.2 mmol/L   Chloride 100 96 - 106 mmol/L   CO2 22 20 - 29 mmol/L   Calcium 9.9 8.7 - 10.2 mg/dL   Total Protein 7.3 6.0 - 8.5 g/dL   Albumin 4.6 3.5 -  5.5 g/dL   Globulin, Total 2.7 1.5 - 4.5 g/dL   Albumin/Globulin Ratio 1.7 1.2 - 2.2   Bilirubin Total 0.3 0.0 - 1.2 mg/dL   Alkaline Phosphatase 48 39 - 117 IU/L   AST 28 0 - 40 IU/L   ALT 37 0 - 44 IU/L  Hemoglobin A1c     Status: None   Collection Time: 08/26/17 12:18 PM  Result Value Ref Range   Hgb A1c MFr Bld 5.3 4.8 - 5.6 %    Comment:          Prediabetes: 5.7 - 6.4          Diabetes: >6.4          Glycemic control for adults with diabetes: <7.0    Est. average glucose Bld gHb Est-mCnc 105 mg/dL  TSH     Status: None   Collection Time: 08/26/17 12:18 PM  Result Value Ref Range   TSH 1.350 0.450 - 4.500 uIU/mL  VITAMIN D 25 Hydroxy (Vit-D Deficiency, Fractures)     Status: Abnormal   Collection Time: 08/26/17 12:18 PM  Result Value Ref Range   Vit D, 25-Hydroxy 21.3 (L) 30.0 - 100.0 ng/mL    Comment: Vitamin D deficiency has been defined by the [place] and an  Endocrine Society practice guideline as a level of serum 25-OH vitamin D less than 20 ng/mL (1,2). The Endocrine Society went on to further define vitamin D insufficiency as a level between 21 and 29 ng/mL (2). 1. IOM (Institute of Medicine). 2010. Dietary reference    intakes for calcium and D. [place]: The    [employer]. 2. Holick MF, Binkley Wilmore, Bischoff-Ferrari HA, et al.    Evaluation, treatment, and prevention of vitamin D    deficiency: an Endocrine Society clinical practice    guideline. JCEM. 2011 Jul; 96(7):1911-30.   Lipid panel     Status: Abnormal   Collection Time: 08/26/17 12:18 PM  Result Value Ref Range   Cholesterol, Total 267 (H) 100 - 199 mg/dL   Triglycerides 845 (HH) 0 - 149 mg/dL   HDL 29 (L) >39 mg/dL   VLDL Cholesterol Cal Comment 5 - 40 mg/dL    Comment: The calculation for the VLDL cholesterol is not valid when triglyceride level is >400 mg/dL.    LDL Calculated Comment 0 - 99 mg/dL    Comment: Triglyceride result indicated is too high for an accurate LDL cholesterol estimation.    Chol/HDL Ratio 9.2 (H) 0.0 - 5.0 ratio    Comment:                                   T. Chol/HDL Ratio                                             Men  Women                               1/2 Avg.Risk  3.4    3.3                                   Avg.Risk  5.0  4.4                                2X Avg.Risk  9.6    7.1                                3X Avg.Risk 23.4   11.0   T4, free     Status: None   Collection Time: 08/26/17 12:18 PM  Result Value Ref Range   Free T4 0.88 0.82 - 1.77 ng/dL  B12 and Folate Panel     Status: None   Collection Time: 08/26/17 12:18 PM  Result Value Ref Range   Vitamin B-12 1,023 232 - 1,245 pg/mL   Folate 5.7 >3.0 ng/mL    Comment: A serum folate concentration of less than 3.1 ng/mL is considered to represent clinical deficiency.   LDL cholesterol, direct     Status: Abnormal   Collection Time: 08/26/17 12:18 PM   Result Value Ref Range   LDL Direct 137 (H) 0 - 99 mg/dL  Specimen status report     Status: None   Collection Time: 08/26/17 12:18 PM  Result Value Ref Range   specimen status report Comment     Comment: Written Authorization Written Authorization Written Authorization Received. Authorization received from Rembert 09-02-2017 Logged by Benita Stabile

## 2017-09-19 DIAGNOSIS — D229 Melanocytic nevi, unspecified: Secondary | ICD-10-CM | POA: Diagnosis not present

## 2017-09-19 DIAGNOSIS — L400 Psoriasis vulgaris: Secondary | ICD-10-CM | POA: Diagnosis not present

## 2017-09-19 DIAGNOSIS — Z79899 Other long term (current) drug therapy: Secondary | ICD-10-CM | POA: Diagnosis not present

## 2017-09-19 DIAGNOSIS — L409 Psoriasis, unspecified: Secondary | ICD-10-CM | POA: Diagnosis not present

## 2017-10-01 DIAGNOSIS — I10 Essential (primary) hypertension: Secondary | ICD-10-CM | POA: Diagnosis not present

## 2017-10-01 DIAGNOSIS — Z6834 Body mass index (BMI) 34.0-34.9, adult: Secondary | ICD-10-CM | POA: Diagnosis not present

## 2017-10-01 DIAGNOSIS — M4712 Other spondylosis with myelopathy, cervical region: Secondary | ICD-10-CM | POA: Insufficient documentation

## 2017-10-01 DIAGNOSIS — M48062 Spinal stenosis, lumbar region with neurogenic claudication: Secondary | ICD-10-CM | POA: Diagnosis not present

## 2017-10-01 DIAGNOSIS — G5601 Carpal tunnel syndrome, right upper limb: Secondary | ICD-10-CM | POA: Diagnosis not present

## 2017-10-01 DIAGNOSIS — M48061 Spinal stenosis, lumbar region without neurogenic claudication: Secondary | ICD-10-CM | POA: Insufficient documentation

## 2017-10-01 DIAGNOSIS — G5602 Carpal tunnel syndrome, left upper limb: Secondary | ICD-10-CM | POA: Diagnosis not present

## 2017-10-03 ENCOUNTER — Ambulatory Visit: Payer: Medicare Other | Admitting: Family Medicine

## 2017-10-04 ENCOUNTER — Ambulatory Visit: Payer: Self-pay | Admitting: Infectious Diseases

## 2017-10-09 DIAGNOSIS — M50221 Other cervical disc displacement at C4-C5 level: Secondary | ICD-10-CM | POA: Diagnosis not present

## 2017-10-09 DIAGNOSIS — M4712 Other spondylosis with myelopathy, cervical region: Secondary | ICD-10-CM | POA: Diagnosis not present

## 2017-10-09 DIAGNOSIS — M50223 Other cervical disc displacement at C6-C7 level: Secondary | ICD-10-CM | POA: Diagnosis not present

## 2017-10-09 DIAGNOSIS — M48062 Spinal stenosis, lumbar region with neurogenic claudication: Secondary | ICD-10-CM | POA: Diagnosis not present

## 2017-10-09 DIAGNOSIS — M545 Low back pain: Secondary | ICD-10-CM | POA: Diagnosis not present

## 2017-10-17 ENCOUNTER — Ambulatory Visit: Payer: Self-pay | Admitting: Internal Medicine

## 2017-10-28 ENCOUNTER — Ambulatory Visit: Payer: Medicare Other | Admitting: Family Medicine

## 2017-11-04 DIAGNOSIS — M48062 Spinal stenosis, lumbar region with neurogenic claudication: Secondary | ICD-10-CM | POA: Diagnosis not present

## 2017-11-04 DIAGNOSIS — M4712 Other spondylosis with myelopathy, cervical region: Secondary | ICD-10-CM | POA: Diagnosis not present

## 2017-11-04 DIAGNOSIS — M5127 Other intervertebral disc displacement, lumbosacral region: Secondary | ICD-10-CM | POA: Diagnosis not present

## 2017-11-04 DIAGNOSIS — G5601 Carpal tunnel syndrome, right upper limb: Secondary | ICD-10-CM | POA: Diagnosis not present

## 2017-11-04 DIAGNOSIS — Z6834 Body mass index (BMI) 34.0-34.9, adult: Secondary | ICD-10-CM | POA: Diagnosis not present

## 2017-11-04 DIAGNOSIS — G5602 Carpal tunnel syndrome, left upper limb: Secondary | ICD-10-CM | POA: Diagnosis not present

## 2017-11-04 DIAGNOSIS — I10 Essential (primary) hypertension: Secondary | ICD-10-CM | POA: Diagnosis not present

## 2017-11-06 ENCOUNTER — Ambulatory Visit (INDEPENDENT_AMBULATORY_CARE_PROVIDER_SITE_OTHER): Payer: Medicare Other | Admitting: Internal Medicine

## 2017-11-06 ENCOUNTER — Other Ambulatory Visit: Payer: Self-pay

## 2017-11-06 ENCOUNTER — Ambulatory Visit
Admission: RE | Admit: 2017-11-06 | Discharge: 2017-11-06 | Disposition: A | Discharge status: Home / Self Care | Payer: Medicare Other | Source: Ambulatory Visit | Attending: Internal Medicine | Admitting: Internal Medicine

## 2017-11-06 ENCOUNTER — Encounter: Payer: Self-pay | Admitting: Internal Medicine

## 2017-11-06 DIAGNOSIS — R7611 Nonspecific reaction to tuberculin skin test without active tuberculosis: Secondary | ICD-10-CM | POA: Diagnosis not present

## 2017-11-06 DIAGNOSIS — Z227 Latent tuberculosis: Secondary | ICD-10-CM | POA: Insufficient documentation

## 2017-11-06 HISTORY — DX: Latent tuberculosis: Z22.7

## 2017-11-06 MED ORDER — RIFAPENTINE 150 MG PO TABS: 900.0000 mg | ORAL_TABLET | ORAL | 2 refills | Status: DC

## 2017-11-06 MED ORDER — VITAMIN B-6 25 MG PO TABS: 25.0000 mg | ORAL_TABLET | Freq: Every day | ORAL | 2 refills | Status: DC

## 2017-11-06 MED ORDER — ISONIAZID 300 MG PO TABS: 900.0000 mg | ORAL_TABLET | ORAL | 2 refills | Status: DC

## 2017-11-06 NOTE — Progress Notes (Signed)
[facility]      Reason for Consult: latent Tb    Referring Physician: Crissie Sickles, PA    Patient ID: Louis Carney, male    DOB: [birth year], 37 y.o.   MRN: [id number]  HPI:   Here for evaluation for latent Tb.  He was originally referred in May but no showed x 2 but here today for evaluation.  He has psoriasis and previously was being followed in [place] and had been on humira and enbrel without relief and was on Cosentyx and did well but has not had recent treatment.  Was evaluated by [facility] with intention to restart but Quantiferon Gold now positive.  Previously had been checked regularly per the patient and was always negative.  No history of significant jail stay, no known exposure to someone with Tb, no foreign travel.  No current sob, cough, fever, weight loss.  Previous record reviewed from dermatology as above.   Past Medical History:  Diagnosis Date  . Hypertension   psoriatsis Hyperlipidemia Seasonal allergies ED Chronic pain  Prior to Admission medications   Medication Sig Start Date End Date Taking? Authorizing Provider  atorvastatin (LIPITOR) 80 MG tablet Take 1 tablet (80 mg total) by mouth at bedtime. 09/12/17  Yes Opalski, Deborah, DO  Cholecalciferol (VITAMIN D PO) Take by mouth.   Yes [provider]  Cimetidine (EQ ACID REDUCER PO) Take 6 tablets by mouth 3 (three) times daily.   Yes [provider]  fexofenadine (ALLEGRA) 180 MG tablet Take 1 tablet (180 mg total) by mouth daily. 08/26/17  Yes Opalski, Neoma Laming, DO  ibuprofen (ADVIL,MOTRIN) 200 MG tablet Take 800 mg by mouth 3 (three) times daily.   Yes [provider]  Olmesartan-amLODIPine-HCTZ 20-5-12.5 MG TABS Start with one half tab daily for 4 days then increase to 1 tab daily 09/12/17  Yes Opalski, Deborah, DO  olopatadine (PATANOL) 0.1 % ophthalmic solution Place 1 drop into both eyes 2 (two) times daily. 08/26/17  Yes Opalski, Neoma Laming, DO    omega-3 acid ethyl esters (LOVAZA) 1 g capsule Take 2 capsules (2 g total) by mouth 2 (two) times daily. 09/12/17  Yes Opalski, Neoma Laming, DO  Vitamin D, Ergocalciferol, (DRISDOL) 50000 units CAPS capsule Take 1 tab Wednesday and Sunday each week. 09/12/17  Yes Opalski, Neoma Laming, DO  isoniazid (NYDRAZID) 300 MG tablet Take 3 tablets (900 mg total) by mouth once a week. 11/06/17   Aeron Lheureux, Okey Regal, MD  Rifapentine 150 MG TABS Take 6 tablets (900 mg total) by mouth once a week. 11/06/17   ComerOkey Regal, MD  vitamin B-6 (PYRIDOXINE) 25 MG tablet Take 1 tablet (25 mg total) by mouth daily. 11/06/17   Sherisa Gilvin, Okey Regal, MD    No Known Allergies  Social History   Tobacco Use  . Smoking status: Current Every Day Smoker    Packs/day: 1.00    Years: 15.00    Pack years: 15.00    Types: Cigarettes  . Smokeless tobacco: Never Used  Substance Use Topics  . Alcohol use: Yes    Alcohol/week: 14.4 oz    Types: 24 Standard drinks or equivalent per week  . Drug use: Never    Family History  Problem Relation Age of Onset  . Cancer Mother        LUNG  . Alcohol abuse Mother   . Hypertension Mother   . Alcohol abuse Father   . Hypertension Father     Review  of Systems  Constitutional: negative for fevers, chills, sweats and weight loss Respiratory: negative for cough or sputum All other systems reviewed and are negative    Constitutional: in no apparent distress and well developed and well nourished  Vitals:   11/06/17 1433 11/06/17 1452  BP: (!) 182/132 (!) 183/132  Pulse: 88    EYES: anicteric ENMT: no thrush Cardiovascular: Cor RRR Respiratory: CTA B; normal respiratory effort GI: Bowel sounds are normal, liver is not enlarged, spleen is not enlarged Musculoskeletal: no pedal edema noted Skin: + psoriatic plaques Hematologic: no cervical lad  Labs: Lab Results  Component Value Date   WBC 8.6 08/26/2017   HGB 16.4 08/26/2017   HCT 45.7 08/26/2017   MCV 91 08/26/2017   PLT 279  08/26/2017    Lab Results  Component Value Date   CREATININE 0.68 (L) 08/26/2017   BUN 10 08/26/2017   NA 140 08/26/2017   K 4.7 08/26/2017   CL 100 08/26/2017   CO2 22 08/26/2017    Lab Results  Component Value Date   ALT 37 08/26/2017   AST 28 08/26/2017   ALKPHOS 48 08/26/2017   BILITOT 0.3 08/26/2017     Assessment: latent Tb with need for biologic therapy.  Since he is not on any biologics until LTBI treatment completed, will start him on weekly INH and rifapentin for 3 months for more rapid completion.  B6 daily   He knows not to drink any alcohol during treatment.   Will check CXR to be sure no active opacity. Recent LFTs wnl.   Plan: 1) inh 900 mg weekly 2) rifapentin 900 mg weekly 3) b6 daily 4) cmp, HIV in 4 weeks He will follow up with Pharm D

## 2017-11-22 DIAGNOSIS — G5601 Carpal tunnel syndrome, right upper limb: Secondary | ICD-10-CM | POA: Diagnosis not present

## 2017-11-22 DIAGNOSIS — G5602 Carpal tunnel syndrome, left upper limb: Secondary | ICD-10-CM | POA: Diagnosis not present

## 2017-11-25 DIAGNOSIS — Z6833 Body mass index (BMI) 33.0-33.9, adult: Secondary | ICD-10-CM | POA: Diagnosis not present

## 2017-11-25 DIAGNOSIS — G5601 Carpal tunnel syndrome, right upper limb: Secondary | ICD-10-CM | POA: Diagnosis not present

## 2017-11-25 DIAGNOSIS — I10 Essential (primary) hypertension: Secondary | ICD-10-CM | POA: Diagnosis not present

## 2017-11-29 ENCOUNTER — Telehealth: Payer: Self-pay | Admitting: Pharmacist

## 2017-11-29 ENCOUNTER — Ambulatory Visit (INDEPENDENT_AMBULATORY_CARE_PROVIDER_SITE_OTHER): Payer: Medicare Other | Admitting: Internal Medicine

## 2017-11-29 ENCOUNTER — Encounter: Payer: Self-pay | Admitting: Internal Medicine

## 2017-11-29 VITALS — BP 148/100 | HR 88 | Temp 98.3°F | Ht 68.0 in | Wt 214.0 lb

## 2017-11-29 DIAGNOSIS — L409 Psoriasis, unspecified: Secondary | ICD-10-CM

## 2017-11-29 DIAGNOSIS — Z5181 Encounter for therapeutic drug level monitoring: Secondary | ICD-10-CM | POA: Diagnosis not present

## 2017-11-29 DIAGNOSIS — Z227 Latent tuberculosis: Secondary | ICD-10-CM

## 2017-11-29 DIAGNOSIS — R7611 Nonspecific reaction to tuberculin skin test without active tuberculosis: Secondary | ICD-10-CM | POA: Diagnosis not present

## 2017-11-29 LAB — COMPLETE METABOLIC PANEL WITH GFR
AG Ratio: 1.8 (calc) (ref 1.0–2.5)
ALBUMIN MSPROF: 4.7 g/dL (ref 3.6–5.1)
ALT: 29 U/L (ref 9–46)
AST: 27 U/L (ref 10–40)
Alkaline phosphatase (APISO): 49 U/L (ref 40–115)
BUN: 16 mg/dL (ref 7–25)
CALCIUM: 9.6 mg/dL (ref 8.6–10.3)
CO2: 27 mmol/L (ref 20–32)
CREATININE: 0.79 mg/dL (ref 0.60–1.35)
Chloride: 98 mmol/L (ref 98–110)
GFR, EST NON AFRICAN AMERICAN: 114 mL/min/{1.73_m2} (ref >=60)
GFR, Est African American: 132 mL/min/{1.73_m2} (ref >=60)
Globulin: 2.6 g/dL (calc) (ref 1.9–3.7)
Glucose, Bld: 109 mg/dL — ABNORMAL HIGH (ref 65–99)
Potassium: 4.1 mmol/L (ref 3.5–5.3)
SODIUM: 136 mmol/L (ref 135–146)
Total Bilirubin: 0.7 mg/dL (ref 0.2–1.2)
Total Protein: 7.3 g/dL (ref 6.1–8.1)

## 2017-11-29 LAB — HIV ANTIBODY (ROUTINE TESTING W REFLEX): HIV 1&2 Ab, 4th Generation: NONREACTIVE

## 2017-11-29 NOTE — Progress Notes (Signed)
Patient ID: Louis Carney, male   DOB: [birth year], 37 y.o.   MRN: [id number]

## 2017-11-29 NOTE — Progress Notes (Signed)
   Subjective:    Patient ID: Claudius Mich, male    DOB: May 16, 1980, 37 y.o.   MRN: [id number]  HPI Here for follow up of latent Tb He had a positive Quantiferon as part of evaluation for Cosentyx for psoriasis.  Previously was negative.  CXR negative for active disease and I had him start on weekly therapy with rifapentin and INH.  Unfortunately, the pharmacy did not provide him with rifapentin and he only took Cayce weekly now 4 weeks.  He though is having no side effects including no associated n/v/d.  No rashes.  No new sob or cough.    Review of Systems  Constitutional: Negative for fatigue.  Gastrointestinal: Negative for diarrhea and nausea.  Skin: Negative for rash.  Neurological: Negative for dizziness.       Objective:   Physical Exam  Constitutional: He appears well-developed and well-nourished. No distress.  HENT:  Mouth/Throat: No oropharyngeal exudate.  Eyes: No scleral icterus.  Cardiovascular: Normal rate, regular rhythm and normal heart sounds.  No murmur heard. Pulmonary/Chest: Effort normal and breath sounds normal. No respiratory distress.  Skin: No rash noted.   SH: + tobacco       Assessment & Plan:

## 2017-11-29 NOTE — Assessment & Plan Note (Signed)
>>  ASSESSMENT AND PLAN FOR PSORIASIS-ONSET AGE 37 HAS NEEDED COSENTYX IN PAST HAS FAILED ENBREL AND HUMIRA WRITTEN ON 11/29/2017 11:24 AM BY COMER, Belia Heman, MD  Continuing treatment for latent Tb and will continue with 12 weeks prior to starting medication.

## 2017-11-29 NOTE — Assessment & Plan Note (Signed)
Continuing treatment for latent Tb and will continue with 12 weeks prior to starting medication.

## 2017-11-29 NOTE — Assessment & Plan Note (Signed)
Will check cmp on medication

## 2017-11-29 NOTE — Assessment & Plan Note (Signed)
Tolerating therapy so far. He will start rifampentin today and then continue with weekly INH + rifapentin and B6 daily.   HIV rtc 4 weeks

## 2017-11-29 NOTE — Telephone Encounter (Signed)
Patient only received isoniazid and not rifapentine from [facility] to treat his latent TB. Called [facility] to see what the issue was and they could not tell me why he received one without the other. The only thing I could think of is if they had to order the rifapentine and had INH in stock and the patient had to come back.  Patient has been taking INH alone x 3 weeks. He will start taking the rifapentine with the INH today.

## 2017-12-03 ENCOUNTER — Other Ambulatory Visit: Payer: Self-pay | Admitting: Neurosurgery

## 2017-12-05 ENCOUNTER — Encounter (HOSPITAL_COMMUNITY): Admission: RE | Payer: Self-pay | Source: Ambulatory Visit

## 2017-12-05 ENCOUNTER — Ambulatory Visit (HOSPITAL_COMMUNITY): Admission: RE | Admit: 2017-12-05 | Payer: Medicare Other | Source: Ambulatory Visit | Admitting: Neurosurgery

## 2017-12-05 SURGERY — CARPAL TUNNEL RELEASE
Anesthesia: Monitor Anesthesia Care | Laterality: Right

## 2017-12-11 ENCOUNTER — Other Ambulatory Visit: Payer: Self-pay | Admitting: Neurosurgery

## 2017-12-29 DIAGNOSIS — G5601 Carpal tunnel syndrome, right upper limb: Secondary | ICD-10-CM

## 2017-12-29 HISTORY — DX: Carpal tunnel syndrome, right upper limb: G56.01

## 2017-12-31 ENCOUNTER — Ambulatory Visit (INDEPENDENT_AMBULATORY_CARE_PROVIDER_SITE_OTHER): Payer: Medicare Other | Admitting: Internal Medicine

## 2017-12-31 ENCOUNTER — Encounter: Payer: Self-pay | Admitting: Internal Medicine

## 2017-12-31 DIAGNOSIS — Z79899 Other long term (current) drug therapy: Secondary | ICD-10-CM

## 2017-12-31 DIAGNOSIS — L409 Psoriasis, unspecified: Secondary | ICD-10-CM

## 2017-12-31 DIAGNOSIS — Z227 Latent tuberculosis: Secondary | ICD-10-CM

## 2017-12-31 DIAGNOSIS — R7611 Nonspecific reaction to tuberculin skin test without active tuberculosis: Secondary | ICD-10-CM | POA: Diagnosis not present

## 2017-12-31 MED ORDER — VITAMIN B-6 25 MG PO TABS: 25.0000 mg | ORAL_TABLET | Freq: Every day | ORAL | 1 refills | Status: DC

## 2017-12-31 MED ORDER — RIFAPENTINE 150 MG PO TABS: 900.0000 mg | ORAL_TABLET | ORAL | 1 refills | Status: DC

## 2017-12-31 MED ORDER — ISONIAZID 300 MG PO TABS: 900.0000 mg | ORAL_TABLET | ORAL | 1 refills | Status: DC

## 2017-12-31 NOTE — Assessment & Plan Note (Signed)
>>  ASSESSMENT AND PLAN FOR PSORIASIS-ONSET AGE 37 HAS NEEDED COSENTYX IN PAST HAS FAILED ENBREL AND HUMIRA WRITTEN ON 12/31/2017  9:48 AM BY COMER, Belia Heman, MD  Will complete 8 weeks more for latent Tb as above and if successful will then be cleared from an ID standpoint for other biologics.

## 2017-12-31 NOTE — Assessment & Plan Note (Signed)
Will complete 8 weeks more for latent Tb as above and if successful will then be cleared from an ID standpoint for other biologics.

## 2017-12-31 NOTE — Assessment & Plan Note (Signed)
Will check lfts next visit on medication He is aware to take B6 daily and is on his AVS as a reminder

## 2017-12-31 NOTE — Assessment & Plan Note (Signed)
I again discussed the treatment and at this point, I feel 8 more weeks of the INH + rifapentine is indicated.  I discussed this at length, AVS given to patient with the medications listed.

## 2017-12-31 NOTE — Progress Notes (Signed)
   Subjective:    Patient ID: Louis Carney, male    DOB: [birth year], 37 y.o.   MRN: [id number]  HPI Here for follow up of latent Tb. He initially was prescribed INH + rifapentin with B6 but only took Haugen alone for 4 weeks.  I then saw him and he started INH + rifapentin for 4 weeks but did not return for the refill and also never took the B6.  He has been off over 4 weeks.  No new issues otherwise.  Tolerated the medication well.     Review of Systems  Gastrointestinal: Negative for diarrhea and nausea.  Skin: Negative for rash.       Objective:   Physical Exam  Constitutional: He appears well-developed and well-nourished. No distress.  Eyes: No scleral icterus.  Cardiovascular: Normal rate, regular rhythm and normal heart sounds.  No murmur heard. Pulmonary/Chest: Effort normal and breath sounds normal. No respiratory distress.  Skin: No rash noted.   SH: + tobacco       Assessment & Plan:

## 2018-01-06 NOTE — Pre-Procedure Instructions (Signed)
Louis Carney  01/06/2018      [place], [place] - [address] [place] [address] Phone: [phone] Fax: [phone]    Your procedure is scheduled on September 18  Report to [facility] Admitting at 0700 A.M.  Call this number if you have problems the morning of surgery:  [phone]   Remember:  Do not eat or drink after midnight.    Take these medicines the morning of surgery with A SIP OF WATER  Cimetidine isoniazid (NYDRAZID) Rifapentine  7 days prior to surgery STOP taking any Aspirin(unless otherwise instructed by your surgeon), Aleve, Naproxen, Ibuprofen, Motrin, Advil, Goody's, BC's, all herbal medications, fish oil, and all vitamins    Do not wear jewelry  Do not wear lotions, powders, or cologne, or deodorant.  Men may shave face and neck.  Do not bring valuables to the hospital.  [facility] is not responsible for any belongings or valuables.  Contacts, dentures or bridgework may not be worn into surgery.  Leave your suitcase in the car.  After surgery it may be brought to your room.  For patients admitted to the hospital, discharge time will be determined by your treatment team.  Patients discharged the day of surgery will not be allowed to drive home.    Special instructions:   [facility]- Preparing For Surgery  Before surgery, you can play an important role. Because skin is not sterile, your skin needs to be as free of germs as possible. You can reduce the number of germs on your skin by washing with CHG (chlorahexidine gluconate) Soap before surgery.  CHG is an antiseptic cleaner which kills germs and bonds with the skin to continue killing germs even after washing.    Oral Hygiene is also important to reduce your risk of infection.  Remember - BRUSH YOUR TEETH THE MORNING OF SURGERY WITH YOUR REGULAR TOOTHPASTE  Please do not use if you have an allergy to CHG or antibacterial soaps. If your  skin becomes reddened/irritated stop using the CHG.  Do not shave (including legs and underarms) for at least 48 hours prior to first CHG shower. It is OK to shave your face.  Please follow these instructions carefully.   1. Shower the NIGHT BEFORE SURGERY and the MORNING OF SURGERY with CHG.   2. If you chose to wash your hair, wash your hair first as usual with your normal shampoo.  3. After you shampoo, rinse your hair and body thoroughly to remove the shampoo.  4. Use CHG as you would any other liquid soap. You can apply CHG directly to the skin and wash gently with a scrungie or a clean washcloth.   5. Apply the CHG Soap to your body ONLY FROM THE NECK DOWN.  Do not use on open wounds or open sores. Avoid contact with your eyes, ears, mouth and genitals (private parts). Wash Face and genitals (private parts)  with your normal soap.  6. Wash thoroughly, paying special attention to the area where your surgery will be performed.  7. Thoroughly rinse your body with warm water from the neck down.  8. DO NOT shower/wash with your normal soap after using and rinsing off the CHG Soap.  9. Pat yourself dry with a CLEAN TOWEL.  10. Wear CLEAN PAJAMAS to bed the night before surgery, wear comfortable clothes the morning of surgery  11. Place CLEAN SHEETS on your bed the night of your  first shower and DO NOT SLEEP WITH PETS.    Day of Surgery:  Do not apply any deodorants/lotions.  Please wear clean clothes to the hospital/surgery center.   Remember to brush your teeth WITH YOUR REGULAR TOOTHPASTE.    Please read over the following fact sheets that you were given.

## 2018-01-07 ENCOUNTER — Other Ambulatory Visit: Payer: Self-pay

## 2018-01-07 ENCOUNTER — Encounter (HOSPITAL_COMMUNITY)
Admission: RE | Admit: 2018-01-07 | Discharge: 2018-01-07 | Disposition: A | Discharge status: Home / Self Care | Payer: Medicare Other | Source: Ambulatory Visit | Attending: Neurosurgery | Admitting: Neurosurgery

## 2018-01-07 ENCOUNTER — Encounter (HOSPITAL_COMMUNITY): Payer: Self-pay

## 2018-01-07 DIAGNOSIS — I10 Essential (primary) hypertension: Secondary | ICD-10-CM | POA: Insufficient documentation

## 2018-01-07 DIAGNOSIS — Z79899 Other long term (current) drug therapy: Secondary | ICD-10-CM | POA: Insufficient documentation

## 2018-01-07 DIAGNOSIS — G5601 Carpal tunnel syndrome, right upper limb: Secondary | ICD-10-CM | POA: Diagnosis not present

## 2018-01-07 DIAGNOSIS — Z01818 Encounter for other preprocedural examination: Secondary | ICD-10-CM | POA: Insufficient documentation

## 2018-01-07 HISTORY — DX: Headache: R51

## 2018-01-07 HISTORY — DX: Headache, unspecified: R51.9

## 2018-01-07 HISTORY — DX: Respiratory tuberculosis unspecified: A15.9

## 2018-01-07 HISTORY — DX: Unspecified osteoarthritis, unspecified site: M19.90

## 2018-01-07 HISTORY — DX: Gastro-esophageal reflux disease without esophagitis: K21.9

## 2018-01-07 HISTORY — DX: Psoriasis, unspecified: L40.9

## 2018-01-07 LAB — CBC
HCT: 46 % (ref 39.0–52.0)
Hemoglobin: 15.9 g/dL (ref 13.0–17.0)
MCH: 31.6 pg (ref 26.0–34.0)
MCHC: 34.6 g/dL (ref 30.0–36.0)
MCV: 91.5 fL (ref 78.0–100.0)
PLATELETS: 254 10*3/uL (ref 150–400)
RBC: 5.03 MIL/uL (ref 4.22–5.81)
RDW: 12.1 % (ref 11.5–15.5)
WBC: 9.8 10*3/uL (ref 4.0–10.5)

## 2018-01-07 LAB — COMPREHENSIVE METABOLIC PANEL
ALBUMIN: 4.3 g/dL (ref 3.5–5.0)
ALK PHOS: 41 U/L (ref 38–126)
ALT: 40 U/L (ref 0–44)
ANION GAP: 12 (ref 5–15)
AST: 44 U/L — ABNORMAL HIGH (ref 15–41)
BILIRUBIN TOTAL: 0.8 mg/dL (ref 0.3–1.2)
BUN: 10 mg/dL (ref 6–20)
CALCIUM: 9.9 mg/dL (ref 8.9–10.3)
CO2: 23 mmol/L (ref 22–32)
Chloride: 102 mmol/L (ref 98–111)
Creatinine, Ser: 0.77 mg/dL (ref 0.61–1.24)
GFR calc Af Amer: >60 mL/min (ref >60)
GFR calc non Af Amer: >60 mL/min (ref >60)
GLUCOSE: 109 mg/dL — AB (ref 70–99)
Potassium: 4.7 mmol/L (ref 3.5–5.1)
Sodium: 137 mmol/L (ref 135–145)
TOTAL PROTEIN: 7.6 g/dL (ref 6.5–8.1)

## 2018-01-07 NOTE — Progress Notes (Addendum)
Anesthesia PAT Evaluation:  Case:  [id number] Date/Time:  01/15/18 0845   Procedure:  Right Carpal tunnel release (Right ) - Right Carpal tunnel release   Anesthesia type:  Monitor Anesthesia Care   Pre-op diagnosis:  Carpal tunnel syndrome of right wrist   Location:  [facility] OR ROOM 85 / [place] OR   Surgeon:  Ashok Pall, MD      DISCUSSION: Patient is a 37 year old male scheduled for the above procedure. Procedure was initially scheduled at an out-patient surgical center, but was postponed due to a tooth abscess which was treated and resolved. He was also diagnosed with latent TB by Valma Cava per [facility] (as part of on-going screening for any biologics for psoriasis management) and is currently on treatment which needs to be completed before being cleared to start Cosentyx.   Of note, I spoke with Infection Prevention RN Cheral Marker and have communicated with ID Dr. Linus Salmons regarding upcoming surgery. Dr. Linus Salmons confirmed "Not active disease so no precautions indicated. OK from ID standpoint to proceed with surgery." Negative CXR 10/2017.   Other history includes smoking, HTN, psoriasis (onset age 7), latent TB (current treatment X ~ 7 more weeks), RSD (right leg). BMI is consistent with obesity.   Patient seen at PAT due to elevated BP (161/115) with current treatment for HTN. Contributing factors include: - 1 5-hour energy drinks per day - drinks 6+ beers/day history  - smoking 1ppd - NSAID long-term use (4 ibuprofen TID for at least 5 years) - Chronic pain  He did take Tribenzor and four ibuprofen this morning. He also smoked a cigarette just before PAT. He had chronic back and RLE pain, exacerbated by walking into PAT today.  He denied chest pain, SOB, edema, syncope. He continues to deny cough, hemoptysis, weight loss, night sweats, fever/chills. He will be holding NSAIDS for surgery.   He does check home BP readings, but only before bed and reports results are typically ~  140-150's/90-100. He does not tend to check it in the morning, but reports it is often much higher that his evening readings. He believes his PCP discussed adding another agent if his BP remained elevated. I advised him to contact his PCP to discuss elevated readings and concern for possible case cancellation if BP on the day of surgery is significantly elevated (a DBP of > 100 may lead to case cancellation; most of his SBP have been < 180). We also discussed lifestyle changes that could help improve his HTN control, which has already been reviewed with him by his PCP.   I will leave chart for follow-up regarding additional input from his PCP. I will plan to follow-up with him prior to his surgery to confirm which anti-hypertensives he should take on the day of surgery. I have updated Jessica at Dr. Lacy Duverney office.  ADDENDUM 01/14/18 3:45 PM:  Patient did contact Dr. Hershal Coria office regarding elevated BP readings, but it doesn't look like his message was received until 01/13/18. No specific BP medication changes made until he can get back in to be seen. His reported home BP readings: 01/13/18 2:10 PM 139/93, 01/14/18 1:41 PM 138/89. I discussed with anesthesiologist Suzette Battiest, MD who agrees that patient should take his olmesartan-amlodipine-HCTZ in the morning with sips of water in hopes he will have an acceptable preoperative BP reading. I notified patient. I updated Jessica at Dr. Lacy Duverney office on 01/13/18.   VS: BP (!) 161/115   Pulse 93   Temp 36.7  C (Oral)   Resp 18   Wt 96.4 kg   SpO2 99%   BMI 32.32 kg/m   01/07/18: BP 162/115, repeat 165/111  BP Readings from Last 3 Encounters:  01/07/18 (!) 161/115  12/31/17 (!) 144/100  11/29/17 (!) 148/100  11/06/17 183/132 (ID initial visit) 09/12/17 160/100 (started on antihypertensive medication) 08/26/17 152/98 Exam: Heart RRR, no murmur. Lungs with scattered wheezes bilaterally that mainly cleared with coughing except faint still in  LUL. No recent URI, fever, chills. No LE edema.   PROVIDERSMellody Dance, DO is PCP, newly established 08/26/17 (moved from [place]). BP 152/98 then. Last visit 09/12/17, and patient was started on amlodipine. Smoking cessation and reduction of caffeine recommended.   LABS: Labs reviewed: Acceptable for surgery. (all labs ordered are listed, but only abnormal results are displayed)  Labs Reviewed  COMPREHENSIVE METABOLIC PANEL - Abnormal; Notable for the following components:      Result Value   Glucose, Bld 109 (*)    AST 44 (*)    All other components within normal limits  CBC    IMAGES: CXR 11/06/17: IMPRESSION: No acute cardiopulmonary process.   EKG: 01/07/18: NSR. Insignificant Q waves in inferolateral leads.    CV: N/A  Past Medical History:  Diagnosis Date  . Hypertension   . Tuberculosis    latent TB infection on treatment    Past Surgical History:  Procedure Laterality Date  . NECK SURGERY      MEDICATIONS: . atorvastatin (LIPITOR) 80 MG tablet  . fexofenadine (ALLEGRA) 180 MG tablet  . ibuprofen (ADVIL,MOTRIN) 200 MG tablet  . isoniazid (NYDRAZID) 300 MG tablet  . Olmesartan-amLODIPine-HCTZ 20-5-12.5 MG TABS  . olopatadine (PATANOL) 0.1 % ophthalmic solution  . omega-3 acid ethyl esters (LOVAZA) 1 g capsule  . pyridOXINE (VITAMIN B-6) 100 MG tablet  . ranitidine (ZANTAC) 150 MG tablet  . Rifapentine 150 MG TABS  . vitamin B-6 (PYRIDOXINE) 25 MG tablet  . Vitamin D, Ergocalciferol, (DRISDOL) 50000 units CAPS capsule   No current facility-administered medications for this encounter.    Taking Olmesartan-amlodipine-HCTZ 20.5-12.5 mg 1 tablet daily.   George Hugh [facility] Short Stay Center/Anesthesiology Phone [phone] 01/07/2018 5:26 PM

## 2018-01-07 NOTE — Progress Notes (Addendum)
PCP  Mellody Dance  DO  Pt. Has latent TB on medication the patient. With latent TB are not infectious and cannot spread TB to others.  See article in chart.  Pt. Has no symptoms no cough,nightsweats or coughing up blood.   BP elevated  161/115  Recheck 165/111  Pt. States he is taking a lot of Ibuprofen 4 tablets 4 times a day.He also drinks at least one 5 hr. Energy drink a day.Also has chronic back pain which is between 10-6 daily.  Anesthesia PA-C called to see patient.  Denies any cardiac history or testing,has never seen a cardiologist.

## 2018-01-07 NOTE — Pre-Procedure Instructions (Signed)
Louis Carney  01/07/2018      [place] [address] [place], [place] - [place] [address] [place] [address] Phone: [phone] Fax: [phone]    Your procedure is scheduled on September 18  Report to [facility] Admitting at 0700 A.M.  Call this number if you have problems the morning of surgery:  [phone]   Remember:  Do not eat or drink after midnight.    Take these medicines the morning of surgery with A SIP OF WATER  Cimetidine isoniazid (NYDRAZID) Rifapentine  7 days prior to surgery STOP taking any Aspirin(unless otherwise instructed by your surgeon), Aleve, Naproxen, Ibuprofen, Motrin, Advil, Goody's, BC's, all herbal medications, fish oil, and all vitamins    Do not wear jewelry  Do not wear lotions, powders, or cologne, or deodorant.  Men may shave face and neck.  Do not bring valuables to the hospital.  [facility] is not responsible for any belongings or valuables.  Contacts, dentures or bridgework may not be worn into surgery.  Leave your suitcase in the car.  After surgery it may be brought to your room.  For patients admitted to the hospital, discharge time will be determined by your treatment team.  Patients discharged the day of surgery will not be allowed to drive home.    Special instructions:   [place]- Preparing For Surgery  Before surgery, you can play an important role. Because skin is not sterile, your skin needs to be as free of germs as possible. You can reduce the number of germs on your skin by washing with CHG (chlorahexidine gluconate) Soap before surgery.  CHG is an antiseptic cleaner which kills germs and bonds with the skin to continue killing germs even after washing.    Oral Hygiene is also important to reduce your risk of infection.  Remember - BRUSH YOUR TEETH THE MORNING OF SURGERY WITH YOUR REGULAR TOOTHPASTE  Please do not use if you have an allergy to CHG or antibacterial soaps. If your  skin becomes reddened/irritated stop using the CHG.  Do not shave (including legs and underarms) for at least 48 hours prior to first CHG shower. It is OK to shave your face.  Please follow these instructions carefully.   1. Shower the NIGHT BEFORE SURGERY and the MORNING OF SURGERY with CHG.   2. If you chose to wash your hair, wash your hair first as usual with your normal shampoo.  3. After you shampoo, rinse your hair and body thoroughly to remove the shampoo.  4. Use CHG as you would any other liquid soap. You can apply CHG directly to the skin and wash gently with a scrungie or a clean washcloth.   5. Apply the CHG Soap to your body ONLY FROM THE NECK DOWN.  Do not use on open wounds or open sores. Avoid contact with your eyes, ears, mouth and genitals (private parts). Wash Face and genitals (private parts)  with your normal soap.  6. Wash thoroughly, paying special attention to the area where your surgery will be performed.  7. Thoroughly rinse your body with warm water from the neck down.  8. DO NOT shower/wash with your normal soap after using and rinsing off the CHG Soap.  9. Pat yourself dry with a CLEAN TOWEL.  10. Wear CLEAN PAJAMAS to bed the night before surgery, wear comfortable clothes the morning of surgery  11. Place CLEAN SHEETS on your bed the night of your  first shower and DO NOT SLEEP WITH PETS.    Day of Surgery:  Do not apply any deodorants/lotions.  Please wear clean clothes to the hospital/surgery center.   Remember to brush your teeth WITH YOUR REGULAR TOOTHPASTE.    Please read over the following fact sheets that you were given.

## 2018-01-13 ENCOUNTER — Telehealth: Payer: Self-pay | Admitting: Family Medicine

## 2018-01-13 NOTE — Telephone Encounter (Signed)
Patient called states having surgery Wednesday, Sept 18@7am  ---had pre-op Friday & was told to contact PCP regarding BP-- Patient states normally run 137/90 & it was elevated--- Per patient notes were sent to PCP for review thru EPIC on Friday by Pre-op, they are concerned & wish Dr. Raliegh Scarlet to review & contact them .  -- Forwarding message to medical assistant to share w/ provider. --glh

## 2018-01-14 NOTE — Telephone Encounter (Signed)
Patient was contacted offered first available appointment for clearance and BP follow up - 01/14/2018 @ 3 pm.  Patient states that surgery is in the morning and the surgeon had advised him to come on in for the procedure and that will determine at that time if they need to reschedule due to BP. MPulliam, CMA/RT(R)

## 2018-01-14 NOTE — Telephone Encounter (Signed)
I was not able to find any notes in epic in regards to pre-op testing results or blood pressure elevation at that time. MPulliam, CMA/RT(R)

## 2018-01-14 NOTE — Anesthesia Preprocedure Evaluation (Addendum)
Anesthesia Evaluation  Patient identified by MRN, date of birth, ID band Patient awake    Reviewed: Allergy & Precautions, H&P , NPO status , Patient's Chart, lab work & pertinent test results  Airway Mallampati: II  TM Distance: >3 FB Neck ROM: Full    Dental no notable dental hx. (+) Teeth Intact, Dental Advisory Given   Pulmonary Current Smoker,    Pulmonary exam normal breath sounds clear to auscultation       Cardiovascular Exercise Tolerance: Good hypertension, Pt. on medications  Rhythm:Regular Rate:Normal     Neuro/Psych  Headaches, negative psych ROS   GI/Hepatic Neg liver ROS, GERD  Medicated,  Endo/Other  negative endocrine ROS  Renal/GU negative Renal ROS  negative genitourinary   Musculoskeletal  (+) Arthritis , Osteoarthritis,    Abdominal   Peds  Hematology negative hematology ROS (+)   Anesthesia Other Findings   Reproductive/Obstetrics negative OB ROS                            Anesthesia Physical Anesthesia Plan  ASA: II  Anesthesia Plan: MAC   Post-op Pain Management:    Induction: Intravenous  PONV Risk Score and Plan: 1 and Propofol infusion, Midazolam and Ondansetron  Airway Management Planned: Simple Face Mask  Additional Equipment:   Intra-op Plan:   Post-operative Plan:   Informed Consent: I have reviewed the patients History and Physical, chart, labs and discussed the procedure including the risks, benefits and alternatives for the proposed anesthesia with the patient or authorized representative who has indicated his/her understanding and acceptance.   Dental advisory given  Plan Discussed with: CRNA  Anesthesia Plan Comments:         Anesthesia Quick Evaluation

## 2018-01-14 NOTE — Telephone Encounter (Signed)
° °  Patient called states having surgery Wednesday, Sept 18@7am  ---had pre-op Friday & was told to contact PCP regarding BP-- Patient states normally run 137/90 & it was elevated--- Per patient notes were sent to PCP for review thru EPIC on Friday by Pre-op, they are concerned & wish Dr. Raliegh Scarlet to review & contact them .  -- Forwarding message to medical assistant to share w/ provider. --glh

## 2018-01-15 ENCOUNTER — Ambulatory Visit (HOSPITAL_COMMUNITY)
Admission: RE | Admit: 2018-01-15 | Discharge: 2018-01-15 | Disposition: A | Discharge status: Home / Self Care | Payer: Medicare Other | Source: Ambulatory Visit | Attending: Neurosurgery | Admitting: Neurosurgery

## 2018-01-15 ENCOUNTER — Encounter (HOSPITAL_COMMUNITY): Payer: Self-pay | Admitting: Anesthesiology

## 2018-01-15 ENCOUNTER — Ambulatory Visit (HOSPITAL_COMMUNITY): Payer: Medicare Other | Admitting: Vascular Surgery

## 2018-01-15 ENCOUNTER — Encounter (HOSPITAL_COMMUNITY)
Admission: RE | Disposition: A | Discharge status: Home / Self Care | Payer: Self-pay | Source: Ambulatory Visit | Attending: Neurosurgery

## 2018-01-15 ENCOUNTER — Other Ambulatory Visit: Payer: Self-pay

## 2018-01-15 DIAGNOSIS — F17210 Nicotine dependence, cigarettes, uncomplicated: Secondary | ICD-10-CM | POA: Diagnosis not present

## 2018-01-15 DIAGNOSIS — Z801 Family history of malignant neoplasm of trachea, bronchus and lung: Secondary | ICD-10-CM | POA: Insufficient documentation

## 2018-01-15 DIAGNOSIS — Z811 Family history of alcohol abuse and dependence: Secondary | ICD-10-CM | POA: Insufficient documentation

## 2018-01-15 DIAGNOSIS — G5603 Carpal tunnel syndrome, bilateral upper limbs: Secondary | ICD-10-CM | POA: Insufficient documentation

## 2018-01-15 DIAGNOSIS — I10 Essential (primary) hypertension: Secondary | ICD-10-CM | POA: Diagnosis not present

## 2018-01-15 DIAGNOSIS — M1990 Unspecified osteoarthritis, unspecified site: Secondary | ICD-10-CM | POA: Insufficient documentation

## 2018-01-15 DIAGNOSIS — K219 Gastro-esophageal reflux disease without esophagitis: Secondary | ICD-10-CM | POA: Diagnosis not present

## 2018-01-15 DIAGNOSIS — Z8249 Family history of ischemic heart disease and other diseases of the circulatory system: Secondary | ICD-10-CM | POA: Diagnosis not present

## 2018-01-15 DIAGNOSIS — G5601 Carpal tunnel syndrome, right upper limb: Secondary | ICD-10-CM | POA: Diagnosis not present

## 2018-01-15 DIAGNOSIS — Z79899 Other long term (current) drug therapy: Secondary | ICD-10-CM | POA: Insufficient documentation

## 2018-01-15 DIAGNOSIS — G4700 Insomnia, unspecified: Secondary | ICD-10-CM | POA: Diagnosis not present

## 2018-01-15 DIAGNOSIS — L409 Psoriasis, unspecified: Secondary | ICD-10-CM | POA: Diagnosis not present

## 2018-01-15 DIAGNOSIS — G43909 Migraine, unspecified, not intractable, without status migrainosus: Secondary | ICD-10-CM | POA: Diagnosis not present

## 2018-01-15 DIAGNOSIS — G894 Chronic pain syndrome: Secondary | ICD-10-CM | POA: Diagnosis not present

## 2018-01-15 DIAGNOSIS — Z8611 Personal history of tuberculosis: Secondary | ICD-10-CM | POA: Insufficient documentation

## 2018-01-15 HISTORY — PX: CARPAL TUNNEL RELEASE: SHX101

## 2018-01-15 SURGERY — CARPAL TUNNEL RELEASE
Anesthesia: Monitor Anesthesia Care | Laterality: Right

## 2018-01-15 MED ORDER — FENTANYL CITRATE (PF) 250 MCG/5ML IJ SOLN
INTRAMUSCULAR | Status: DC | PRN
  Administered 2018-01-15 (×5): 50 ug via INTRAVENOUS

## 2018-01-15 MED ORDER — HYDROMORPHONE HCL 1 MG/ML IJ SOLN: 0.2500 mg | INTRAMUSCULAR | Status: DC | PRN

## 2018-01-15 MED ORDER — LACTATED RINGERS IV SOLN
INTRAVENOUS | Status: DC
  Administered 2018-01-15: 08:00:00 via INTRAVENOUS

## 2018-01-15 MED ORDER — PROPOFOL 10 MG/ML IV BOLUS
INTRAVENOUS | Status: DC | PRN
  Administered 2018-01-15 (×2): 30 mg via INTRAVENOUS

## 2018-01-15 MED ORDER — LIDOCAINE-EPINEPHRINE 0.5 %-1:200000 IJ SOLN
INTRAMUSCULAR | Status: AC
  Filled 2018-01-15: qty 1

## 2018-01-15 MED ORDER — CHLORHEXIDINE GLUCONATE CLOTH 2 % EX PADS: 6.0000 | MEDICATED_PAD | Freq: Once | CUTANEOUS | Status: DC

## 2018-01-15 MED ORDER — PROPOFOL 500 MG/50ML IV EMUL
INTRAVENOUS | Status: DC | PRN
  Administered 2018-01-15: 125 ug/kg/min via INTRAVENOUS

## 2018-01-15 MED ORDER — LIDOCAINE HCL (CARDIAC) PF 100 MG/5ML IV SOSY
PREFILLED_SYRINGE | INTRAVENOUS | Status: DC | PRN
  Administered 2018-01-15: 60 mg via INTRATRACHEAL

## 2018-01-15 MED ORDER — PROPOFOL 1000 MG/100ML IV EMUL
INTRAVENOUS | Status: AC
  Filled 2018-01-15: qty 100

## 2018-01-15 MED ORDER — LIDOCAINE 2% (20 MG/ML) 5 ML SYRINGE
INTRAMUSCULAR | Status: AC
  Filled 2018-01-15: qty 5

## 2018-01-15 MED ORDER — CEFAZOLIN SODIUM-DEXTROSE 2-4 GM/100ML-% IV SOLN
2.0000 g | INTRAVENOUS | Status: AC
  Administered 2018-01-15: 2 g via INTRAVENOUS
  Filled 2018-01-15: qty 100

## 2018-01-15 MED ORDER — MIDAZOLAM HCL 2 MG/2ML IJ SOLN
INTRAMUSCULAR | Status: AC
  Filled 2018-01-15: qty 2

## 2018-01-15 MED ORDER — MIDAZOLAM HCL 5 MG/5ML IJ SOLN
INTRAMUSCULAR | Status: DC | PRN
  Administered 2018-01-15 (×2): 2 mg via INTRAVENOUS

## 2018-01-15 MED ORDER — 0.9 % SODIUM CHLORIDE (POUR BTL) OPTIME
TOPICAL | Status: DC | PRN
  Administered 2018-01-15: 1000 mL

## 2018-01-15 MED ORDER — ACETAMINOPHEN-CODEINE #3 300-30 MG PO TABS: 1.0000 | ORAL_TABLET | Freq: Four times a day (QID) | ORAL | 0 refills | Status: DC | PRN

## 2018-01-15 MED ORDER — ROCURONIUM BROMIDE 50 MG/5ML IV SOSY
PREFILLED_SYRINGE | INTRAVENOUS | Status: AC
  Filled 2018-01-15: qty 5

## 2018-01-15 MED ORDER — FENTANYL CITRATE (PF) 250 MCG/5ML IJ SOLN
INTRAMUSCULAR | Status: AC
  Filled 2018-01-15: qty 5

## 2018-01-15 MED ORDER — PROPOFOL 10 MG/ML IV BOLUS
INTRAVENOUS | Status: AC
  Filled 2018-01-15: qty 20

## 2018-01-15 MED ORDER — BACITRACIN ZINC 500 UNIT/GM EX OINT
TOPICAL_OINTMENT | CUTANEOUS | Status: DC | PRN
  Administered 2018-01-15: 1 via TOPICAL

## 2018-01-15 MED ORDER — ONDANSETRON HCL 4 MG/2ML IJ SOLN
INTRAMUSCULAR | Status: DC | PRN
  Administered 2018-01-15: 4 mg via INTRAVENOUS

## 2018-01-15 MED ORDER — LIDOCAINE-EPINEPHRINE 0.5 %-1:200000 IJ SOLN
INTRAMUSCULAR | Status: DC | PRN
  Administered 2018-01-15: 7 mL

## 2018-01-15 SURGICAL SUPPLY — 37 items
BANDAGE ACE 3X5.8 VEL STRL LF (GAUZE/BANDAGES/DRESSINGS) ×3 IMPLANT
BLADE SURG 15 STRL LF DISP TIS (BLADE) ×1 IMPLANT
BLADE SURG 15 STRL SS (BLADE) ×2
CABLE BIPOLOR RESECTION CORD (MISCELLANEOUS) ×3 IMPLANT
DRAPE EXTREMITY T 121X128X90 (DRAPE) ×3 IMPLANT
DRAPE HALF SHEET 40X57 (DRAPES) ×3 IMPLANT
DURAPREP 26ML APPLICATOR (WOUND CARE) ×3 IMPLANT
GAUZE 4X4 16PLY RFD (DISPOSABLE) ×3 IMPLANT
GAUZE SPONGE 4X4 12PLY STRL (GAUZE/BANDAGES/DRESSINGS) ×3 IMPLANT
GLOVE BIOGEL PI IND STRL 6.5 (GLOVE) ×3 IMPLANT
GLOVE BIOGEL PI IND STRL 7.5 (GLOVE) ×1 IMPLANT
GLOVE BIOGEL PI INDICATOR 6.5 (GLOVE) ×6
GLOVE BIOGEL PI INDICATOR 7.5 (GLOVE) ×2
GLOVE ECLIPSE 6.5 STRL STRAW (GLOVE) ×6 IMPLANT
GLOVE SS N UNI LF 6.5 STRL (GLOVE) ×3 IMPLANT
GLOVE SS N UNI LF 7.0 STRL (GLOVE) ×3 IMPLANT
GOWN STRL REUS W/ TWL LRG LVL3 (GOWN DISPOSABLE) ×3 IMPLANT
GOWN STRL REUS W/ TWL XL LVL3 (GOWN DISPOSABLE) ×1 IMPLANT
GOWN STRL REUS W/TWL 2XL LVL3 (GOWN DISPOSABLE) IMPLANT
GOWN STRL REUS W/TWL LRG LVL3 (GOWN DISPOSABLE) ×6
GOWN STRL REUS W/TWL XL LVL3 (GOWN DISPOSABLE) ×2
KIT BASIN OR (CUSTOM PROCEDURE TRAY) ×3 IMPLANT
KIT TURNOVER KIT B (KITS) ×3 IMPLANT
NEEDLE HYPO 25X1 1.5 SAFETY (NEEDLE) ×3 IMPLANT
NS IRRIG 1000ML POUR BTL (IV SOLUTION) ×3 IMPLANT
PACK SURGICAL SETUP 50X90 (CUSTOM PROCEDURE TRAY) ×3 IMPLANT
PAD ARMBOARD 7.5X6 YLW CONV (MISCELLANEOUS) ×9 IMPLANT
STOCKINETTE 4X48 STRL (DRAPES) ×3 IMPLANT
SUT ETHILON 3 0 PS 1 (SUTURE) ×3 IMPLANT
SYR BULB 3OZ (MISCELLANEOUS) ×3 IMPLANT
SYR CONTROL 10ML LL (SYRINGE) ×3 IMPLANT
TOWEL GREEN STERILE (TOWEL DISPOSABLE) ×3 IMPLANT
TOWEL GREEN STERILE FF (TOWEL DISPOSABLE) ×3 IMPLANT
TUBE CONNECTING 12'X1/4 (SUCTIONS) ×1
TUBE CONNECTING 12X1/4 (SUCTIONS) ×2 IMPLANT
UNDERPAD 30X30 (UNDERPADS AND DIAPERS) ×3 IMPLANT
WATER STERILE IRR 1000ML POUR (IV SOLUTION) ×3 IMPLANT

## 2018-01-15 NOTE — Telephone Encounter (Signed)
Tell patient he can go to anesthesiology for medical preop clearance if he prefers that.

## 2018-01-15 NOTE — Op Note (Signed)
   11:12 AM  PATIENT:  Louis Carney  37 y.o. male with bilateral carpal tunnel syndrome. He chose to decompress the right side first as it is more painful.   PRE-OPERATIVE DIAGNOSIS:  right Carpal Tunnel Syndrome  POST-OPERATIVE DIAGNOSIS:  right Carpal Tunnel Syndrome  PROCEDURE:  Procedure(s): Right Carpal tunnel release  SURGEON: Surgeon(s): Ashok Pall, MD  ANESTHESIA:   local and IV sedation  EBL:  Total I/O In: 1000 [I.V.:900; IV Piggyback:100] Out: 10 [Blood:10]  COUNT:per nursing  DICTATION: Emad Brechtel was taken to the operating room, given IV sedation, and positioned on the operating room table. He had his right upper extremity prepped and draped in a sterile manner. I infiltrated Licodcaine  0/9% 3/818,299 strength epinephrine into the planned incision starting at the proximal palmar crease extending into the hand ~ 1.5cm. I opened the skin with a 15 blade and extended the incision through the skin into the subcutaneous tissue. I used the bipolar cautery to control the subcutaneous bleeding. I dissected sharply through the tissue using forceps also to expose the transverse carpal ligament. I divided the transverse carpal ligament sharply with the 15 blade using the forceps to protect the contents of the carpal tunnel. With the scissors I divided the ligament both proximally and distally to decompress the entire carpal tunnel. I used the scissors to dissect into the forearm to create space to divide the ligament to the proximal palmar crease, and distally into the palm.  I irrigated the wound then closed the incision with vertical interrupted vertical mattress sutures. I placed a sterile dressing, then wrapped the proximal hand and distal forearm with an ace wrap.  PLAN OF CARE: Discharge to home after PACU  PATIENT DISPOSITION:  PACU - hemodynamically stable.   Delay start of Pharmacological VTE agent (>24hrs) due to surgical blood loss or risk of bleeding:  yes

## 2018-01-15 NOTE — Anesthesia Postprocedure Evaluation (Signed)
Anesthesia Post Note  Patient: Louis Carney  Procedure(s) Performed: Right Carpal tunnel release (Right )     Patient location during evaluation: PACU Anesthesia Type: MAC Level of consciousness: awake and alert Pain management: pain level controlled Vital Signs Assessment: post-procedure vital signs reviewed and stable Respiratory status: spontaneous breathing, nonlabored ventilation and respiratory function stable Cardiovascular status: stable and blood pressure returned to baseline Postop Assessment: no apparent nausea or vomiting Anesthetic complications: no    Last Vitals:  Vitals:   01/15/18 1055 01/15/18 1110  BP: 128/79 129/78  Pulse: 94 87  Resp: 15 16  Temp: (!) 36.4 C 36.7 C  SpO2: 96% 99%    Last Pain:  Vitals:   01/15/18 1110  TempSrc:   PainSc: 0-No pain                 Damion Kant,W. EDMOND

## 2018-01-15 NOTE — H&P (Signed)
BP (!) 152/90   Pulse 89   Temp 97.6 F (36.4 C) (Oral)   Resp 20   Ht 5\' 8"  (1.727 m)   Wt 96.4 kg   SpO2 100%   BMI 32.31 kg/m  Louis Carney is a 37 y.o. male Whom presents with emg proven carpal tunnel syndrome bilaterally. He has opted for operative decompression of the right carpal tunnel. No Known Allergies Past Medical History:  Diagnosis Date  . Arthritis   . GERD (gastroesophageal reflux disease)   . Headache    migraines  . Hypertension   . Psoriasis    since age 45 years  . Tuberculosis    latent TB infection on treatment   Past Surgical History:  Procedure Laterality Date  . CERVICAL SPINE SURGERY    . NECK SURGERY     Family History  Problem Relation Age of Onset  . Cancer Mother        LUNG  . Alcohol abuse Mother   . Hypertension Mother   . Alcohol abuse Father   . Hypertension Father    Social History   Socioeconomic History  . Marital status: Married    Spouse name: Not on file  . Number of children: Not on file  . Years of education: Not on file  . Highest education level: Not on file  Occupational History  . Not on file  Social Needs  . Financial resource strain: Not on file  . Food insecurity:    Worry: Not on file    Inability: Not on file  . Transportation needs:    Medical: Not on file    Non-medical: Not on file  Tobacco Use  . Smoking status: Current Every Day Smoker    Packs/day: 1.00    Years: 15.00    Pack years: 15.00    Types: Cigarettes  . Smokeless tobacco: Never Used  Substance and Sexual Activity  . Alcohol use: Yes    Alcohol/week: 24.0 standard drinks    Types: 24 Standard drinks or equivalent per week  . Drug use: Never  . Sexual activity: Yes    Birth control/protection: None  Lifestyle  . Physical activity:    Days per week: Not on file    Minutes per session: Not on file  . Stress: Not on file  Relationships  . Social connections:    Talks on phone: Not on file    Gets together: Not on file   Attends religious service: Not on file    Active member of club or organization: Not on file    Attends meetings of clubs or organizations: Not on file    Relationship status: Not on file  . Intimate partner violence:    Fear of current or ex partner: Not on file    Emotionally abused: Not on file    Physically abused: Not on file    Forced sexual activity: Not on file  Other Topics Concern  . Not on file  Social History Narrative  . Not on file   Prior to Admission medications   Medication Sig Start Date End Date Taking? Authorizing Provider  atorvastatin (LIPITOR) 80 MG tablet Take 1 tablet (80 mg total) by mouth at bedtime. 09/12/17  Yes Opalski, Neoma Laming, DO  fexofenadine (ALLEGRA) 180 MG tablet Take 1 tablet (180 mg total) by mouth daily. Patient taking differently: Take 180 mg by mouth daily as needed for allergies.  08/26/17  Yes Opalski, Deborah, DO  ibuprofen (ADVIL,MOTRIN) 200 MG  tablet Take 800 mg by mouth 2 (two) times daily as needed for moderate pain.    Yes [provider]  isoniazid (NYDRAZID) 300 MG tablet Take 3 tablets (900 mg total) by mouth once a week. 12/31/17  Yes Comer, Okey Regal, MD  Olmesartan-amLODIPine-HCTZ 20-5-12.5 MG TABS Start with one half tab daily for 4 days then increase to 1 tab daily Patient taking differently: Take 1 tablet by mouth daily.  09/12/17  Yes Opalski, Neoma Laming, DO  olopatadine (PATANOL) 0.1 % ophthalmic solution Place 1 drop into both eyes 2 (two) times daily. Patient taking differently: Place 2 drops into the left eye 2 (two) times daily.  08/26/17  Yes Opalski, Deborah, DO  pyridOXINE (VITAMIN B-6) 100 MG tablet Take 100 mg by mouth daily.   Yes [provider]  ranitidine (ZANTAC) 150 MG tablet Take 300 mg by mouth 2 (two) times daily.   Yes [provider]  Rifapentine 150 MG TABS Take 6 tablets (900 mg total) by mouth once a week. 12/31/17  Yes Comer, Okey Regal, MD  omega-3 acid ethyl esters (LOVAZA) 1 g capsule Take  2 capsules (2 g total) by mouth 2 (two) times daily. Patient not taking: Reported on 12/31/2017 09/12/17   Mellody Dance, DO  vitamin B-6 (PYRIDOXINE) 25 MG tablet Take 1 tablet (25 mg total) by mouth daily. Patient not taking: Reported on 01/06/2018 12/31/17 02/25/18  Thayer Headings, MD  Vitamin D, Ergocalciferol, (DRISDOL) 50000 units CAPS capsule Take 1 tab Wednesday and Sunday each week. Patient not taking: Reported on 12/31/2017 09/12/17   Mellody Dance, DO   Physical Exam  Constitutional: He is oriented to person, place, and time. He appears well-developed and well-nourished. No distress.  HENT:  Head: Normocephalic and atraumatic.  Right Ear: External ear normal.  Left Ear: External ear normal.  Mouth/Throat: Oropharynx is clear and moist.  Eyes: Pupils are equal, round, and reactive to light. Conjunctivae and EOM are normal.  Neck: Normal range of motion. Neck supple.  Cardiovascular: Normal rate, regular rhythm, normal heart sounds and intact distal pulses.  Pulmonary/Chest: Effort normal.  Abdominal: Soft.  Musculoskeletal: Normal range of motion.  Neurological: He is alert and oriented to person, place, and time. He displays normal reflexes. A sensory deficit is present. No cranial nerve deficit. He exhibits normal muscle tone. Coordination normal.  Positive tinels sign bilaterally over transverse carpal ligament   A/P Or for right carpal tunnel release. Risks including nerve damage, weakness, inability to use thumb, infection, bleeding, were discussed. He understands and wishes to proceed with carpal tunnel release on the right side.

## 2018-01-15 NOTE — Telephone Encounter (Signed)
Patient has surgery this morning, he had to arrive early for them to access his BP.  Procedure was done and patient called office to make follow and states that he is doing well. MPulliam, CMA/RT(R)

## 2018-01-15 NOTE — Transfer of Care (Signed)
Immediate Anesthesia Transfer of Care Note  Patient: Louis Carney  Procedure(s) Performed: Right Carpal tunnel release (Right )  Patient Location: PACU  Anesthesia Type:MAC  Level of Consciousness: awake, alert , oriented and patient cooperative  Airway & Oxygen Therapy: Patient Spontanous Breathing and Patient connected to nasal cannula oxygen  Post-op Assessment: Report given to RN and Post -op Vital signs reviewed and stable  Post vital signs: Reviewed and stable  Last Vitals:  Vitals Value Taken Time  BP    Temp    Pulse    Resp    SpO2      Last Pain:  Vitals:   01/15/18 0755  TempSrc:   PainSc: 6       Patients Stated Pain Goal: 4 (57/97/28 2060)  Complications: No apparent anesthesia complications

## 2018-01-16 ENCOUNTER — Encounter (HOSPITAL_COMMUNITY): Payer: Self-pay | Admitting: Neurosurgery

## 2018-01-27 ENCOUNTER — Ambulatory Visit (INDEPENDENT_AMBULATORY_CARE_PROVIDER_SITE_OTHER): Payer: Medicare Other | Admitting: Family Medicine

## 2018-01-27 ENCOUNTER — Encounter: Payer: Self-pay | Admitting: Family Medicine

## 2018-01-27 VITALS — BP 164/119 | HR 96 | Ht 68.0 in | Wt 216.3 lb

## 2018-01-27 DIAGNOSIS — E7800 Pure hypercholesterolemia, unspecified: Secondary | ICD-10-CM

## 2018-01-27 DIAGNOSIS — F1599 Other stimulant use, unspecified with unspecified stimulant-induced disorder: Secondary | ICD-10-CM

## 2018-01-27 DIAGNOSIS — G4701 Insomnia due to medical condition: Secondary | ICD-10-CM

## 2018-01-27 DIAGNOSIS — E786 Lipoprotein deficiency: Secondary | ICD-10-CM

## 2018-01-27 DIAGNOSIS — F17210 Nicotine dependence, cigarettes, uncomplicated: Secondary | ICD-10-CM

## 2018-01-27 DIAGNOSIS — I10 Essential (primary) hypertension: Secondary | ICD-10-CM

## 2018-01-27 DIAGNOSIS — Z79899 Other long term (current) drug therapy: Secondary | ICD-10-CM

## 2018-01-27 DIAGNOSIS — E559 Vitamin D deficiency, unspecified: Secondary | ICD-10-CM

## 2018-01-27 DIAGNOSIS — E781 Pure hyperglyceridemia: Secondary | ICD-10-CM

## 2018-01-27 DIAGNOSIS — Z789 Other specified health status: Secondary | ICD-10-CM | POA: Diagnosis not present

## 2018-01-27 MED ORDER — OLMESARTAN-AMLODIPINE-HCTZ 20-5-12.5 MG PO TABS: ORAL_TABLET | ORAL | 0 refills | Status: DC

## 2018-01-27 MED ORDER — AMITRIPTYLINE HCL 25 MG PO TABS: 50.0000 mg | ORAL_TABLET | Freq: Every day | ORAL | 0 refills | Status: DC

## 2018-01-27 NOTE — Progress Notes (Signed)
Impression and Recommendations:    1. HTN   2. 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 Drinks beer- 6-8 beers/d on ave ( 10 Yrs or so)   4. Elevated LDL cholesterol level   5. Low level of high density lipoprotein (HDL)   6. Hypertriglyceridemia   7. Cigarette smoker- 3 ppd for 6 yrs, then 1ppd for 9 yrs- 27 pk yr hx   8. Insomnia due to medical condition   9. Vitamin D deficiency   10. High risk medication use    HTN -Doubled olmesartan dosage -Bp poorly controlled at home -Instructed pt to call with bp logs within the next week -Strongly encouraged pt to make lifestyle and dietary changes to help decrease his bp -Reviewed medication instructions from last appointment -Instructed pt to bring in a bp log with next appointment -Discussed negative impact of excessive drinking on blood pressure -Discussed healthy goal bp levels for patients at his age -Discussed the negative long term impacts of uncontrolled bp on the heart including CHF -Discussed possible further treatment if bp can not come under control with increasing medications -Explained how metoprolol can help with pulse rate as well HTN  Caffeine Abuse -Discussed the negative impact of caffeine on HTN  -Encouraged pt to substantially decrease his daily intake of caffeine due to negative impacts on HTN  Alcohol Abuse -Strongly encouraged pt to decrease the amount of alcohol consumption -Encouraged pt to decrease to 1-2 beers per night -Discussed the negative impact of excessive alcohol use on HTN  HLD -Described positive impact of lovaza on management of cholesterol -Discussed negative impact of elevated cholesterol levels in conjunction with HTN -Discussed negative impact of low HLD on cardiovascular health -Discussed what foods impact triglycerides -Discussed how fish oil can help reduce triglycerides  -Encouraged pt to consider making dietary changes and increasing daily exercise to help  with controlling cholesterol levels  Tobacco Abuse -Discussed negative impact of tobacco abuse on overall health -Encouraged pt to quit or decrease daily tobacco consumption -Discussed negative impact on bp and cardiovascular health  Insomnia -Prescribed elovil -Encouraged pt to stop sleeping on the couch and move back into his bed -Discussed how poor sleep environment can contribute to poor sleep quality at night -Discussed how poor sleep surface can trigger and worsen chronic back pain -Discussed possible sleep medications to help with struggles  Follow up -Return for cholesterol and blood work  -Instructed pt to return with Bp log   Education and routine counseling performed. Handouts provided.   Medications Discontinued During This Encounter  Medication Reason  . Olmesartan-amLODIPine-HCTZ 20-5-12.5 MG TABS      The patient was counseled, risk factors were discussed, anticipatory guidance given.  Gross side effects, risk and benefits, and alternatives of medications discussed with patient.  Patient is aware that all medications have potential side effects and we are unable to predict every side effect or drug-drug interaction that may occur.  Expresses verbal understanding and consents to current therapy plan and treatment regimen.  Return for Follow-up 6 weeks with fasting blood work 5 days prior.  Please see AVS handed out to patient at the end of our visit for further patient instructions/ counseling done pertaining to today's office visit.    Note:  This document was prepared using Dragon voice recognition software and may include unintentional dictation errors.  This document serves as a record of services personally performed by Mellody Dance, MD. It was created on  her behalf by Georga Bora, a trained medical scribe. The creation of this record is based on the scribe's personal observations and the provider's statements to them.   I have reviewed the above  medical documentation for accuracy and completeness and I concur.  Mellody Dance, D.O.      Subjective:    HPI: Louis Carney is a 37 y.o. male who presents to [place] [facility] today for follow up of Louis Carney.    Alcohol Abuse -Pt still drinks roughly 6-8 beers a night -States he no longer drinks shots or liquor -States he's noticed now he is super tired after a 6 pack and just wants to go to sleep -Pt states he drinks because he cannot sleep without the alcohol  -States it takes him over 2 hrs to fall asleep if he is not drinking -States he's tried OTC medications like tylenol PM but he is very groggy in the morning -States his wife gets upset when he won't get alcohol or drink a lot with her  Tobacco abuse -Pt states he uses slightly less than a pack a day  Back Pain -Pt states his neurologist wants to do his carpal tunnel surgery next instead of his back surgery -Pt says he wants to exercise and used to be very active but he can't do anything due to his back pain -States he "can't even walk through the grocery store much less work out" with the amount of pain he's in -Pt says he misses working out, cardio and lifting weights and feels he uses alcohol and tobacco to cope with his situation -Feels guilt about smoking and drinking and wants to get surgery "I can be a better example to my kids"  Sleep -Pt states he drinks in order to sleep at night -States he cannot sleep without alcohol and has difficulty sleeping because he is on the couch -Pt says he may need a medication to help with sleeping so he can decrease his alcohol intake  HTN: -157/96 today in OV -  His blood pressure has not been controlled at home.   -Pt states he is taking his bp medication as directed   -Pt has been checking it regularly and states his blood pressure has still been high  -States he's quit drinking energy drinks as well -Says his diastolic pressure is usually  in the 100's  -States he has never been on metoprolol and has never been on bp medication aside from what's prescribed -Pt drinks 4-5 cups of coffee each morning  - Patient reports good compliance with blood pressure medications - Denies medication S-E   - Smoking Status noted  - He denies new onset of: chest pain, exercise intolerance, shortness of breath, dizziness, visual changes, headache, lower extremity swelling or claudication.   Last 3 blood pressure readings in our office are as follows: BP Readings from Last 3 Encounters:  03/07/18 105/85  02/27/18 138/90  02/26/18 (!) 147/108    Pulse Readings from Last 3 Encounters:  03/07/18 80  02/27/18 100  02/26/18 99    CHOL HPI:  -  He  is currently managed with lipitor and lovaza -Pt states he hasn't been taking the lovaza because it makes his stomach hurt even if he takes the medication on a full stomach  Patient reports very little compliance with low chol/ saturated and trans fat diet.  No exercise  RUQ pain- no  Muscle aches- no No other s-e  Last lipid panel  as follows:  Lab Results  Component Value Date   CHOL 345 (H) 03/06/2018   HDL 31 (L) 03/06/2018   LDLCALC Comment 03/06/2018   LDLDIRECT 137 (H) 08/26/2017   TRIG 1,099 (HH) 03/06/2018   CHOLHDL 11.1 (H) 03/06/2018    Hepatic Function Latest Ref Rng & Units 03/06/2018 02/27/2018 01/29/2018  Total Protein 6.0 - 8.5 g/dL 6.8 6.9 7.3  Albumin 3.5 - 5.5 g/dL 4.5 4.3 -  AST 0 - 40 IU/L 40 80(H) 49(H)  ALT 0 - 44 IU/L 57(H) 35 36  Alk Phosphatase 39 - 117 IU/L 39 39 -  Total Bilirubin 0.0 - 1.2 mg/dL 0.2 2.3(H) 0.5    Latent TB -States he's been dealing with      Patient Care Team    Relationship Specialty Notifications Start End  Mellody Dance, DO PCP - General Family Medicine  08/26/17      Lab Results  Component Value Date   CREATININE 0.80 03/06/2018   BUN 8 03/06/2018   NA 135 03/06/2018   K 4.6 03/06/2018   CL 96 03/06/2018   CO2 22  03/06/2018    Lab Results  Component Value Date   CHOL 345 (H) 03/06/2018   CHOL 267 (H) 08/26/2017    Lab Results  Component Value Date   HDL 31 (L) 03/06/2018   HDL 29 (L) 08/26/2017    Lab Results  Component Value Date   LDLCALC Comment 03/06/2018   [place] Comment 08/26/2017    Lab Results  Component Value Date   TRIG 1,099 ([place]) 03/06/2018   TRIG 845 (HH) 08/26/2017    Lab Results  Component Value Date   CHOLHDL 11.1 (H) 03/06/2018   CHOLHDL 9.2 (H) 08/26/2017    Lab Results  Component Value Date   LDLDIRECT 137 (H) 08/26/2017   ===================================================================   Patient Active Problem List   Diagnosis Date Noted  . Cigarette smoker- 3 ppd for 6 yrs, then 1ppd for 9 yrs- 27 pk yr hx 08/26/2017    Priority: High  . HTN 08/26/2017    Priority: High  . NSAID long-term use-4 ibuprofen 3 times daily times at least 5 years 08/26/2017    Priority: Medium  . Mood disorder ([place])- secondary to chronic pain syndrome 08/26/2017    Priority: Medium  . HNP (herniated nucleus pulposus), lumbar 03/07/2018  . Transaminitis 01/29/2018  . Medication monitoring encounter 11/29/2017  . TB lung, latent 11/06/2017  . Family history of diabetes mellitus in father 09/12/2017  . High risk medication use 09/12/2017  . Vitamin D deficiency 09/12/2017  . Elevated LDL cholesterol level 09/12/2017  . Low level of high density lipoprotein (HDL) 09/12/2017  . Hypertriglyceridemia 09/12/2017  . Psoriasis-onset age 49 has needed Cosentyx in past has failed Enbrel and Humira 08/26/2017  . Chronic radicular low back pain- R sided 08/26/2017  . History of excision of lamina of cervical vertebra for decompression of sp- C4-6inal cord 08/26/2017  . 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 RSD (reflex sympathetic dystrophy)- right entire leg painful to non-noxious stimuli 08/26/2017  .  Drinks beer- 6-8 beers/d on ave ( 10 Yrs or so) 08/26/2017  . 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 Chronic fatigue 08/26/2017  . Insomnia 08/26/2017  . Environmental and seasonal allergies 08/26/2017  . ED (erectile dysfunction) 08/26/2017  . Allergic conjunctivitis of both eyes and rhinitis 08/26/2017  . Obesity, Class I, BMI 30-34.9 08/26/2017  .  Plaque psoriasis 08/26/2017     Past Medical History:  Diagnosis Date  . Arthritis   . GERD (gastroesophageal reflux disease)   . Headache    migraines  . Hypertension   . Psoriasis    since age 42 years  . Tuberculosis    latent TB infection on treatment     Past Surgical History:  Procedure Laterality Date  . CARPAL TUNNEL RELEASE Right 01/15/2018   Procedure: Right Carpal tunnel release;  Surgeon: Ashok Pall, MD;  Location: [place];  Service: Neurosurgery;  Laterality: Right;  Right Carpal tunnel release  . CERVICAL SPINE SURGERY    . NECK SURGERY       Family History  Problem Relation Age of Onset  . Cancer Mother        LUNG  . Alcohol abuse Mother   . Hypertension Mother   . Alcohol abuse Father   . Hypertension Father      Social History   Substance and Sexual Activity  Drug Use Never  ,  Social History   Substance and Sexual Activity  Alcohol Use Yes  . Alcohol/week: 24.0 standard drinks  . Types: 24 Standard drinks or equivalent per week  ,  Social History   Tobacco Use  Smoking Status Current Every Day Smoker  . Packs/day: 1.00  . Years: 15.00  . Pack years: 15.00  . Types: Cigarettes  Smokeless Tobacco Never Used  ,    No current facility-administered medications on file prior to visit.    Current Outpatient Medications on File Prior to Visit  Medication Sig Dispense Refill  . acetaminophen-codeine (TYLENOL #3) 300-30 MG tablet Take 1 tablet by mouth every 6 (six) hours as needed for moderate pain. 30 tablet 0  . atorvastatin (LIPITOR) 80 MG tablet Take  1 tablet (80 mg total) by mouth at bedtime. 90 tablet 3  . fexofenadine (ALLEGRA) 180 MG tablet Take 1 tablet (180 mg total) by mouth daily. (Patient taking differently: Take 180 mg by mouth daily as needed for allergies. ) 90 tablet 3  . ibuprofen (ADVIL,MOTRIN) 200 MG tablet Take 800 mg by mouth 3 (three) times daily as needed for moderate pain.     Marland Kitchen isoniazid (NYDRAZID) 300 MG tablet Take 3 tablets (900 mg total) by mouth once a week. (Patient taking differently: Take 900 mg by mouth every Wednesday. ) 12 tablet 1  . olopatadine (PATANOL) 0.1 % ophthalmic solution Place 1 drop into both eyes 2 (two) times daily. 5 mL 11  . omega-3 acid ethyl esters (LOVAZA) 1 g capsule Take 2 capsules (2 g total) by mouth 2 (two) times daily. 180 capsule 3  . pyridOXINE (VITAMIN B-6) 100 MG tablet Take 100 mg by mouth daily.    . ranitidine (ZANTAC) 150 MG tablet Take 300 mg by mouth 2 (two) times daily.    . Rifapentine 150 MG TABS Take 6 tablets (900 mg total) by mouth once a week. (Patient taking differently: Take 900 mg by mouth every Wednesday. ) 24 each 1  . Vitamin D, Ergocalciferol, (DRISDOL) 50000 units CAPS capsule Take 1 tab Wednesday and Sunday each week. 24 capsule 10     No Known Allergies   Review of Systems:   General:  Denies fever, chills Optho/Auditory:   Denies visual changes, blurred vision Respiratory:   Denies SOB, cough, wheeze, DIB  Cardiovascular:   Denies chest pain, palpitations, painful respirations Gastrointestinal:   Denies nausea, vomiting, diarrhea.  Endocrine:  Denies new hot or cold intolerance Musculoskeletal:  Denies joint swelling, gait issues, or new unexplained myalgias/ arthralgias Skin:  Denies rash, suspicious lesions  Neurological:    Denies dizziness, unexplained weakness, numbness  Psychiatric/Behavioral:   Denies mood changes  Objective:    Blood pressure (!) 164/119, pulse 96, height '5\' 8"'  (1.727 m), weight 216 lb 4.8 oz (98.1 kg), SpO2 98  %.  Body mass index is 32.89 kg/m.  General: Well Developed, well nourished, and in no acute distress.  HEENT: Normocephalic, atraumatic, pupils equal round reactive to light, neck supple, No carotid bruits, no JVD Skin: Warm and dry, cap RF less 2 sec Cardiac: Regular rate and rhythm, S1, S2 WNL's, no murmurs rubs or gallops Respiratory: ECTA B/L, Not using accessory muscles, speaking in full sentences. NeuroM-Sk: Ambulates w/o assistance, moves ext * 4 w/o difficulty, sensation grossly intact.  Ext: scant edema b/l lower ext Psych: No HI/SI, judgement and insight good, Euthymic mood. Full Affect.

## 2018-01-27 NOTE — Patient Instructions (Addendum)
-Bring in Blood pressure log to next appointment -please record blood pressures as well as heart rates.  -You are going to double your current blood pressure medication to 2 pills daily.  We will need to obtain blood work 5 days prior to a follow-up office visit with me in 6 weeks to see how you are doing on the new medicines.  Also am starting you on amitriptyline\Elavil at night to help with your sleep  -Please, please cut back on the cigarettes and/or quit.  Also no more than 1-2 caffeinated beverages per day and no more than 1-2 beers max per day.  These are all negatively affecting your blood pressure, and your well-being and you just need to be better to your body.      Hypertension Hypertension, commonly called high blood pressure, is when the force of blood pumping through the arteries is too strong. The arteries are the blood vessels that carry blood from the heart throughout the body. Hypertension forces the heart to work harder to pump blood and may cause arteries to become narrow or stiff. Having untreated or uncontrolled hypertension can cause heart attacks, strokes, kidney disease, and other problems. A blood pressure reading consists of a higher number over a lower number. Ideally, your blood pressure should be below 120/80. The first ("top") number is called the systolic pressure. It is a measure of the pressure in your arteries as your heart beats. The second ("bottom") number is called the diastolic pressure. It is a measure of the pressure in your arteries as the heart relaxes. What are the causes? The cause of this condition is not known. What increases the risk? Some risk factors for high blood pressure are under your control. Others are not. Factors you can change  Smoking.  Having type 2 diabetes mellitus, high cholesterol, or both.  Not getting enough exercise or physical activity.  Being overweight.  Having too much fat, sugar, calories, or salt (sodium) in your  diet.  Drinking too much alcohol. Factors that are difficult or impossible to change  Having chronic kidney disease.  Having a family history of high blood pressure.  Age. Risk increases with age.  Race. You may be at higher risk if you are African-American.  Gender. Men are at higher risk than women before age 39. After age 51, women are at higher risk than men.  Having obstructive sleep apnea.  Stress. What are the signs or symptoms? Extremely high blood pressure (hypertensive crisis) may cause:  Headache.  Anxiety.  Shortness of breath.  Nosebleed.  Nausea and vomiting.  Severe chest pain.  Jerky movements you cannot control (seizures).  How is this diagnosed? This condition is diagnosed by measuring your blood pressure while you are seated, with your arm resting on a surface. The cuff of the blood pressure monitor will be placed directly against the skin of your upper arm at the level of your heart. It should be measured at least twice using the same arm. Certain conditions can cause a difference in blood pressure between your right and left arms. Certain factors can cause blood pressure readings to be lower or higher than normal (elevated) for a short period of time:  When your blood pressure is higher when you are in a health care provider's office than when you are at home, this is called white coat hypertension. Most people with this condition do not need medicines.  When your blood pressure is higher at home than when you are in  a health care provider's office, this is called masked hypertension. Most people with this condition may need medicines to control blood pressure.  If you have a high blood pressure reading during one visit or you have normal blood pressure with other risk factors:  You may be asked to return on a different day to have your blood pressure checked again.  You may be asked to monitor your blood pressure at home for 1 week or longer.  If  you are diagnosed with hypertension, you may have other blood or imaging tests to help your health care provider understand your overall risk for other conditions. How is this treated? This condition is treated by making healthy lifestyle changes, such as eating healthy foods, exercising more, and reducing your alcohol intake. Your health care provider may prescribe medicine if lifestyle changes are not enough to get your blood pressure under control, and if:  Your systolic blood pressure is above 130.  Your diastolic blood pressure is above 80.  Your personal target blood pressure may vary depending on your medical conditions, your age, and other factors. Follow these instructions at home: Eating and drinking  Eat a diet that is high in fiber and potassium, and low in sodium, added sugar, and fat. An example eating plan is called the DASH (Dietary Approaches to Stop Hypertension) diet. To eat this way: ? Eat plenty of fresh fruits and vegetables. Try to fill half of your plate at each meal with fruits and vegetables. ? Eat whole grains, such as whole wheat pasta, brown rice, or whole grain bread. Fill about one quarter of your plate with whole grains. ? Eat or drink low-fat dairy products, such as skim milk or low-fat yogurt. ? Avoid fatty cuts of meat, processed or cured meats, and poultry with skin. Fill about one quarter of your plate with lean proteins, such as fish, chicken without skin, beans, eggs, and tofu. ? Avoid premade and processed foods. These tend to be higher in sodium, added sugar, and fat.  Reduce your daily sodium intake. Most people with hypertension should eat less than 1,500 mg of sodium a day.  Limit alcohol intake to no more than 1 drink a day for nonpregnant women and 2 drinks a day for men. One drink equals 12 oz of beer, 5 oz of wine, or 1 oz of hard liquor. Lifestyle  Work with your health care provider to maintain a healthy body weight or to lose weight. Ask  what an ideal weight is for you.  Get at least 30 minutes of exercise that causes your heart to beat faster (aerobic exercise) most days of the week. Activities may include walking, swimming, or biking.  Include exercise to strengthen your muscles (resistance exercise), such as pilates or lifting weights, as part of your weekly exercise routine. Try to do these types of exercises for 30 minutes at least 3 days a week.  Do not use any products that contain nicotine or tobacco, such as cigarettes and e-cigarettes. If you need help quitting, ask your health care provider.  Monitor your blood pressure at home as told by your health care provider.  Keep all follow-up visits as told by your health care provider. This is important. Medicines  Take over-the-counter and prescription medicines only as told by your health care provider. Follow directions carefully. Blood pressure medicines must be taken as prescribed.  Do not skip doses of blood pressure medicine. Doing this puts you at risk for problems and can make  the medicine less effective.  Ask your health care provider about side effects or reactions to medicines that you should watch for. Contact a health care provider if:  You think you are having a reaction to a medicine you are taking.  You have headaches that keep coming back (recurring).  You feel dizzy.  You have swelling in your ankles.  You have trouble with your vision. Get help right away if:  You develop a severe headache or confusion.  You have unusual weakness or numbness.  You feel faint.  You have severe pain in your chest or abdomen.  You vomit repeatedly.  You have trouble breathing. Summary  Hypertension is when the force of blood pumping through your arteries is too strong. If this condition is not controlled, it may put you at risk for serious complications.  Your personal target blood pressure may vary depending on your medical conditions, your age, and  other factors. For most people, a normal blood pressure is less than 120/80.  Hypertension is treated with lifestyle changes, medicines, or a combination of both. Lifestyle changes include weight loss, eating a healthy, low-sodium diet, exercising more, and limiting alcohol. This information is not intended to replace advice given to you by your health care provider. Make sure you discuss any questions you have with your health care provider. Document Released: 04/16/2005 Document Revised: 03/14/2016 Document Reviewed: 03/14/2016 Elsevier Interactive Patient Education  2018 [place] out the DASH diet = 1.5 Gram Low Sodium Diet   A 1.5 gram sodium diet restricts the amount of sodium in the diet to no more than 1.5 g or 1500 mg daily.  The American Heart Association recommends Americans over the age of 49 to consume no more than 1500 mg of sodium each day to reduce the risk of developing high blood pressure.  Research also shows that limiting sodium may reduce heart attack and stroke risk.  Many foods contain sodium for flavor and sometimes as a preservative.  When the amount of sodium in a diet needs to be low, it is important to know what to look for when choosing foods and drinks.  The following includes some information and guidelines to help make it easier for you to adapt to a low sodium diet.    QUICK TIPS  Do not add salt to food.  Avoid convenience items and fast food.  Choose unsalted snack foods.  Buy lower sodium products, often labeled as "lower sodium" or "no salt added."  Check food labels to learn how much sodium is in 1 serving.  When eating at a restaurant, ask that your food be prepared with less salt or none, if possible.    READING FOOD LABELS FOR SODIUM INFORMATION  The nutrition facts label is a good place to find how much sodium is in foods. Look for products with no more than 400 mg of sodium per serving.  Remember that 1.5 g = 1500 mg.  The food  label may also list foods as:  Sodium-free: Less than 5 mg in a serving.  Very low sodium: 35 mg or less in a serving.  Low-sodium: 140 mg or less in a serving.  Light in sodium: 50% less sodium in a serving. For example, if a food that usually has 300 mg of sodium is changed to become light in sodium, it will have 150 mg of sodium.  Reduced sodium: 25% less sodium in a serving. For example, if a  food that usually has 400 mg of sodium is changed to reduced sodium, it will have 300 mg of sodium.    CHOOSING FOODS  Grains  Avoid: Salted crackers and snack items. Some cereals, including instant hot cereals. Bread stuffing and biscuit mixes. Seasoned rice or pasta mixes.  Choose: Unsalted snack items. Low-sodium cereals, oats, puffed wheat and rice, shredded wheat. English muffins and bread. Pasta.  Meats  Avoid: Salted, canned, smoked, spiced, pickled meats, including fish and poultry. Bacon, ham, sausage, cold cuts, hot dogs, anchovies.  Choose: Low-sodium canned tuna and salmon. Fresh or frozen meat, poultry, and fish.  Dairy  Avoid: Processed cheese and spreads. Cottage cheese. Buttermilk and condensed milk. Regular cheese.  Choose: Milk. Low-sodium cottage cheese. Yogurt. Sour cream. Low-sodium cheese.  Fruits and Vegetables  Avoid: Regular canned vegetables. Regular canned tomato sauce and paste. Frozen vegetables in sauces. Olives. Angie Fava. Relishes. Sauerkraut.  Choose: Low-sodium canned vegetables. Low-sodium tomato sauce and paste. Frozen or fresh vegetables. Fresh and frozen fruit.  Condiments  Avoid: Canned and packaged gravies. Worcestershire sauce. Tartar sauce. Barbecue sauce. Soy sauce. Steak sauce. Ketchup. Onion, garlic, and table salt. Meat flavorings and tenderizers.  Choose: Fresh and dried herbs and spices. Low-sodium varieties of mustard and ketchup. Lemon juice. Tabasco sauce. Horseradish.    SAMPLE 1.5 GRAM SODIUM MEAL PLAN:   Breakfast / Sodium (mg)  1 cup low-fat  milk / 143 mg  1 whole-wheat English muffin / 240 mg  1 tbs heart-healthy margarine / 153 mg  1 hard-boiled egg / 139 mg  1 small orange / 0 mg  Lunch / Sodium (mg)  1 cup raw carrots / 76 mg  2 tbs no salt added peanut butter / 5 mg  2 slices whole-wheat bread / 270 mg  1 tbs jelly / 6 mg   cup red grapes / 2 mg  Dinner / Sodium (mg)  1 cup whole-wheat pasta / 2 mg  1 cup low-sodium tomato sauce / 73 mg  3 oz lean ground beef / 57 mg  1 small side salad (1 cup raw spinach leaves,  cup cucumber,  cup yellow bell pepper) with 1 tsp olive oil and 1 tsp red wine vinegar / 25 mg  Snack / Sodium (mg)  1 container low-fat vanilla yogurt / 107 mg  3 graham cracker squares / 127 mg  Nutrient Analysis  Calories: 1745  Protein: 75 g  Carbohydrate: 237 g  Fat: 57 g  Sodium: 1425 mg  Document Released: 04/16/2005 Document Revised: 12/27/2010 Document Reviewed: 07/18/2009  [facility] Patient Information 2012 [place], [place].

## 2018-01-29 ENCOUNTER — Ambulatory Visit (INDEPENDENT_AMBULATORY_CARE_PROVIDER_SITE_OTHER): Payer: Medicare Other | Admitting: Internal Medicine

## 2018-01-29 ENCOUNTER — Encounter: Payer: Self-pay | Admitting: Internal Medicine

## 2018-01-29 VITALS — BP 143/99 | HR 89 | Temp 98.0°F | Wt 215.8 lb

## 2018-01-29 DIAGNOSIS — R740 Nonspecific elevation of levels of transaminase and lactic acid dehydrogenase [LDH]: Secondary | ICD-10-CM | POA: Diagnosis not present

## 2018-01-29 DIAGNOSIS — R7401 Elevation of levels of liver transaminase levels: Secondary | ICD-10-CM

## 2018-01-29 DIAGNOSIS — R7611 Nonspecific reaction to tuberculin skin test without active tuberculosis: Secondary | ICD-10-CM

## 2018-01-29 DIAGNOSIS — Z227 Latent tuberculosis: Secondary | ICD-10-CM

## 2018-01-29 HISTORY — DX: Elevation of levels of liver transaminase levels: R74.01

## 2018-01-29 LAB — COMPLETE METABOLIC PANEL WITH GFR
AG Ratio: 1.8 (calc) (ref 1.0–2.5)
ALKALINE PHOSPHATASE (APISO): 39 U/L — AB (ref 40–115)
ALT: 36 U/L (ref 9–46)
AST: 49 U/L — ABNORMAL HIGH (ref 10–40)
Albumin: 4.7 g/dL (ref 3.6–5.1)
BUN: 10 mg/dL (ref 7–25)
CHLORIDE: 96 mmol/L — AB (ref 98–110)
CO2: 25 mmol/L (ref 20–32)
Calcium: 9.5 mg/dL (ref 8.6–10.3)
Creat: 0.72 mg/dL (ref 0.60–1.35)
GFR, Est African American: 137 mL/min/{1.73_m2} (ref >=60)
GFR, Est Non African American: 118 mL/min/{1.73_m2} (ref >=60)
GLUCOSE: 94 mg/dL (ref 65–99)
Globulin: 2.6 g/dL (calc) (ref 1.9–3.7)
Potassium: 4.4 mmol/L (ref 3.5–5.3)
Sodium: 133 mmol/L — ABNORMAL LOW (ref 135–146)
TOTAL PROTEIN: 7.3 g/dL (ref 6.1–8.1)
Total Bilirubin: 0.5 mg/dL (ref 0.2–1.2)

## 2018-01-29 NOTE — Assessment & Plan Note (Signed)
Mild AST elevation.  Will recheck today

## 2018-01-29 NOTE — Assessment & Plan Note (Signed)
Doing well and will complete 4 more weeks, getting refill soon.  rtc 4 weeks

## 2018-01-29 NOTE — Progress Notes (Signed)
   Subjective:    Patient ID: Louis Carney, male    DOB: 04/30/1981, 37 y.o.   MRN: [id number]  HPI Here for follow up of latent Tb. He initially was prescribed INH + rifapentin with B6 but only took [place] alone for 4 weeks.  I then saw him and he started INH + rifapentin for 4 weeks but did not return for the refill and also never took the B6.  He has been off over 4 weeks.  I then decided to put him back on for 8 more weeks to complete the course.  Now on his last refill.  Some mild transaminitis recently.    Review of Systems  Gastrointestinal: Negative for diarrhea and nausea.  Skin: Negative for rash.       Objective:   Physical Exam  Constitutional: He appears well-developed and well-nourished. No distress.  Eyes: No scleral icterus.  Cardiovascular: Normal rate, regular rhythm and normal heart sounds.  No murmur heard. Pulmonary/Chest: Effort normal and breath sounds normal. No respiratory distress.  Skin: No rash noted.   SH: + tobacco       Assessment & Plan:

## 2018-02-02 ENCOUNTER — Other Ambulatory Visit: Payer: Self-pay | Admitting: Family Medicine

## 2018-02-02 DIAGNOSIS — I10 Essential (primary) hypertension: Secondary | ICD-10-CM

## 2018-02-03 NOTE — Telephone Encounter (Signed)
Patient was seen 01/27/2018.  Per pharmacy insurance will not cover blood medication for 2 tablets daily.  Please review and advise. MPulliam, CMA/RT(R)

## 2018-02-04 MED ORDER — OLMESARTAN-AMLODIPINE-HCTZ 40-10-25 MG PO TABS: ORAL_TABLET | ORAL | 0 refills | Status: DC

## 2018-02-04 NOTE — Addendum Note (Signed)
Addended by: Lanier Prude D on: 02/04/2018 10:23 AM   Modules accepted: Orders

## 2018-02-11 ENCOUNTER — Other Ambulatory Visit: Payer: Self-pay | Admitting: Neurosurgery

## 2018-02-26 ENCOUNTER — Encounter (HOSPITAL_COMMUNITY): Payer: Self-pay

## 2018-02-26 ENCOUNTER — Ambulatory Visit (INDEPENDENT_AMBULATORY_CARE_PROVIDER_SITE_OTHER): Payer: Medicare Other | Admitting: Internal Medicine

## 2018-02-26 ENCOUNTER — Encounter: Payer: Self-pay | Admitting: Internal Medicine

## 2018-02-26 DIAGNOSIS — R740 Nonspecific elevation of levels of transaminase and lactic acid dehydrogenase [LDH]: Secondary | ICD-10-CM | POA: Diagnosis not present

## 2018-02-26 DIAGNOSIS — Z227 Latent tuberculosis: Secondary | ICD-10-CM | POA: Diagnosis not present

## 2018-02-26 DIAGNOSIS — R7401 Elevation of levels of liver transaminase levels: Secondary | ICD-10-CM

## 2018-02-26 NOTE — Progress Notes (Addendum)
PCP: Mellody Dance, DO  Cardiologist: pt denies  EKG: 01/07/18 in EPIC  Stress test: pt denies  ECHO: pt denies  Cardiac Cath:  Pt denies  Chest x-ray: 7/04/30/17 in Epic  Patient has psoriasis and has been unable to use Cosentyx while receiving treatment for latent TB.  His treatment is complete and he has some psoriasis on his back.  He is concerned as it is close to surgical site.  I advised patient to call Dr. Lacy Duverney office and inform him of this ASAP as I am uncertain if it could interfere with his surgery but I know his surgeon needs to know. He verbalized understanding and said he would call Dr. Lacy Duverney office today.

## 2018-02-26 NOTE — Assessment & Plan Note (Signed)
Mild elevation from the treatment.  Now off of medications so no further issues.

## 2018-02-26 NOTE — Progress Notes (Signed)
   Subjective:    Patient ID: Houa Ackert, male    DOB: [birth year], 37 y.o.   MRN: [id number]  HPI Here for follow up of latent Tb. He initially was prescribed INH + rifapentin with B6 but only took [place] alone for 4 weeks.  I then saw him and he started INH + rifapentin for 4 weeks but did not return for the refill and also never took the B6.  I then decided to put him back on for 8 more weeks to complete the course which he has completed.  Some mild transaminitis recently.    Review of Systems  Gastrointestinal: Negative for diarrhea and nausea.  Skin: Negative for rash.       Objective:   Physical Exam  Constitutional: He appears well-developed and well-nourished. No distress.  Eyes: No scleral icterus.  Cardiovascular: Normal rate, regular rhythm and normal heart sounds.  No murmur heard. Pulmonary/Chest: Effort normal and breath sounds normal. No respiratory distress.  Skin: No rash noted.   SH: + tobacco       Assessment & Plan:

## 2018-02-26 NOTE — Assessment & Plan Note (Addendum)
He has now completed treatment.  OK from ID standpoint to proceed with appropriate biologic/Cosyntx treatment as indicated.   Return as needed

## 2018-02-26 NOTE — Pre-Procedure Instructions (Signed)
Mitzie Na  02/26/2018      [facility] [place], [place] - [address] [address] Phone: [phone] Fax: [phone]    Your procedure is scheduled on March 07, 2018.  Report to [facility] Admitting at 1000 AM.  Call this number if you have problems the morning of surgery:  [phone]   Remember:  Do not eat or drink after midnight.    Take these medicines the morning of surgery with A SIP OF WATER  Eye drops Tylenol with codeine-if needed for pain  7 days prior to surgery STOP taking any Aspirin (unless otherwise instructed by your surgeon), Aleve, Naproxen, Ibuprofen, Motrin, Advil, Goody's, BC's, all herbal medications, fish oil, and all vitamins    Do not wear jewelry  Do not wear lotions, powders, or colognes, or deodorant.  Men may shave face and neck.  Do not bring valuables to the hospital.  [facility] is not responsible for any belongings or valuables.  Contacts, dentures or bridgework may not be worn into surgery.  Leave your suitcase in the car.  After surgery it may be brought to your room.  For patients admitted to the hospital, discharge time will be determined by your treatment team.  Patients discharged the day of surgery will not be allowed to drive home.   - Preparing For Surgery  Before surgery, you can play an important role. Because skin is not sterile, your skin needs to be as free of germs as possible. You can reduce the number of germs on your skin by washing with CHG (chlorahexidine gluconate) Soap before surgery.  CHG is an antiseptic cleaner which kills germs and bonds with the skin to continue killing germs even after washing.    Oral Hygiene is also important to reduce your risk of infection.  Remember - BRUSH YOUR TEETH THE MORNING OF SURGERY WITH YOUR REGULAR TOOTHPASTE  Please do not use if you have an allergy to CHG or antibacterial soaps. If your skin becomes  reddened/irritated stop using the CHG.  Do not shave (including legs and underarms) for at least 48 hours prior to first CHG shower. It is OK to shave your face.  Please follow these instructions carefully.   1. Shower the NIGHT BEFORE SURGERY and the MORNING OF SURGERY with CHG.   2. If you chose to wash your hair, wash your hair first as usual with your normal shampoo.  3. After you shampoo, rinse your hair and body thoroughly to remove the shampoo.  4. Use CHG as you would any other liquid soap. You can apply CHG directly to the skin and wash gently with a scrungie or a clean washcloth.   5. Apply the CHG Soap to your body ONLY FROM THE NECK DOWN.  Do not use on open wounds or open sores. Avoid contact with your eyes, ears, mouth and genitals (private parts). Wash Face and genitals (private parts)  with your normal soap.  6. Wash thoroughly, paying special attention to the area where your surgery will be performed.  7. Thoroughly rinse your body with warm water from the neck down.  8. DO NOT shower/wash with your normal soap after using and rinsing off the CHG Soap.  9. Pat yourself dry with a CLEAN TOWEL.  10. Wear CLEAN PAJAMAS to bed the night before surgery, wear comfortable clothes the morning of surgery  11. Place CLEAN SHEETS on your bed the night of  your first shower and DO NOT SLEEP WITH PETS.    Day of Surgery:  Do not apply any deodorants/lotions.  Please wear clean clothes to the hospital/surgery center.   Remember to brush your teeth WITH YOUR REGULAR TOOTHPASTE.   Please read over the fact sheets that you were given.

## 2018-02-27 ENCOUNTER — Encounter (HOSPITAL_COMMUNITY): Payer: Self-pay

## 2018-02-27 ENCOUNTER — Encounter (HOSPITAL_COMMUNITY)
Admission: RE | Admit: 2018-02-27 | Discharge: 2018-02-27 | Disposition: A | Discharge status: Home / Self Care | Payer: Medicare Other | Source: Ambulatory Visit | Attending: Neurosurgery | Admitting: Neurosurgery

## 2018-02-27 ENCOUNTER — Other Ambulatory Visit: Payer: Self-pay

## 2018-02-27 DIAGNOSIS — Z01812 Encounter for preprocedural laboratory examination: Secondary | ICD-10-CM | POA: Diagnosis not present

## 2018-02-27 LAB — COMPREHENSIVE METABOLIC PANEL
ALT: 35 U/L (ref 0–44)
ANION GAP: 13 (ref 5–15)
AST: 80 U/L — ABNORMAL HIGH (ref 15–41)
Albumin: 4.3 g/dL (ref 3.5–5.0)
Alkaline Phosphatase: 39 U/L (ref 38–126)
BUN: 12 mg/dL (ref 6–20)
CO2: 20 mmol/L — AB (ref 22–32)
Calcium: 9 mg/dL (ref 8.9–10.3)
Chloride: 98 mmol/L (ref 98–111)
Creatinine, Ser: 0.82 mg/dL (ref 0.61–1.24)
Glucose, Bld: 108 mg/dL — ABNORMAL HIGH (ref 70–99)
Potassium: 5 mmol/L (ref 3.5–5.1)
Sodium: 131 mmol/L — ABNORMAL LOW (ref 135–145)
TOTAL PROTEIN: 6.9 g/dL (ref 6.5–8.1)
Total Bilirubin: 2.3 mg/dL — ABNORMAL HIGH (ref 0.3–1.2)

## 2018-02-27 LAB — CBC
HEMATOCRIT: 45.9 % (ref 39.0–52.0)
Hemoglobin: 15.9 g/dL (ref 13.0–17.0)
MCH: 30.9 pg (ref 26.0–34.0)
MCHC: 34.6 g/dL (ref 30.0–36.0)
MCV: 89.1 fL (ref 80.0–100.0)
NRBC: 0 % (ref 0.0–0.2)
PLATELETS: 300 10*3/uL (ref 150–400)
RBC: 5.15 MIL/uL (ref 4.22–5.81)
RDW: 11.6 % (ref 11.5–15.5)
WBC: 7.9 10*3/uL (ref 4.0–10.5)

## 2018-02-27 LAB — SURGICAL PCR SCREEN
MRSA, PCR: NEGATIVE
Staphylococcus aureus: NEGATIVE

## 2018-02-27 MED ORDER — CHLORHEXIDINE GLUCONATE CLOTH 2 % EX PADS: 6.0000 | MEDICATED_PAD | Freq: Once | CUTANEOUS | Status: DC

## 2018-02-28 DIAGNOSIS — M5126 Other intervertebral disc displacement, lumbar region: Secondary | ICD-10-CM

## 2018-02-28 HISTORY — DX: Other intervertebral disc displacement, lumbar region: M51.26

## 2018-02-28 NOTE — Anesthesia Preprocedure Evaluation (Addendum)
Anesthesia Evaluation  Patient identified by MRN, date of birth, ID band Patient awake    Reviewed: Allergy & Precautions, NPO status , Patient's Chart, lab work & pertinent test results  History of Anesthesia Complications Negative for: history of anesthetic complications  Airway Mallampati: IV  TM Distance: >3 FB Neck ROM: Full    Dental no notable dental hx.    Pulmonary neg pulmonary ROS, Current Smoker,    Pulmonary exam normal        Cardiovascular hypertension, Normal cardiovascular exam     Neuro/Psych negative neurological ROS  negative psych ROS   GI/Hepatic Neg liver ROS, GERD  Medicated and Controlled,  Endo/Other  negative endocrine ROS  Renal/GU negative Renal ROS  negative genitourinary   Musculoskeletal negative musculoskeletal ROS (+)   Abdominal (+) + obese,   Peds  Hematology negative hematology ROS (+)   Anesthesia Other Findings Latent TB infection- never had any symptoms. Now treated.  Reproductive/Obstetrics                            Anesthesia Physical Anesthesia Plan  ASA: II  Anesthesia Plan: General   Post-op Pain Management:    Induction: Intravenous  PONV Risk Score and Plan: 1 and Ondansetron, Dexamethasone, Midazolam and Treatment may vary due to age or medical condition  Airway Management Planned: Oral ETT  Additional Equipment: None  Intra-op Plan:   Post-operative Plan: Extubation in OR  Informed Consent: I have reviewed the patients History and Physical, chart, labs and discussed the procedure including the risks, benefits and alternatives for the proposed anesthesia with the patient or authorized representative who has indicated his/her understanding and acceptance.     Plan Discussed with:   Anesthesia Plan Comments: (Just completed treatment for latent TB. Mild transaminitis due to treatment. Dr. Linus Salmons following.)        Anesthesia Quick Evaluation

## 2018-03-06 ENCOUNTER — Other Ambulatory Visit: Payer: Medicare Other

## 2018-03-06 DIAGNOSIS — Z789 Other specified health status: Secondary | ICD-10-CM | POA: Diagnosis not present

## 2018-03-06 DIAGNOSIS — I10 Essential (primary) hypertension: Secondary | ICD-10-CM

## 2018-03-06 DIAGNOSIS — E781 Pure hyperglyceridemia: Secondary | ICD-10-CM

## 2018-03-06 DIAGNOSIS — E559 Vitamin D deficiency, unspecified: Secondary | ICD-10-CM | POA: Diagnosis not present

## 2018-03-06 DIAGNOSIS — E7800 Pure hypercholesterolemia, unspecified: Secondary | ICD-10-CM

## 2018-03-06 DIAGNOSIS — E786 Lipoprotein deficiency: Secondary | ICD-10-CM

## 2018-03-06 DIAGNOSIS — Z79899 Other long term (current) drug therapy: Secondary | ICD-10-CM

## 2018-03-07 ENCOUNTER — Other Ambulatory Visit: Payer: Self-pay

## 2018-03-07 ENCOUNTER — Ambulatory Visit (HOSPITAL_COMMUNITY)
Admission: RE | Admit: 2018-03-07 | Discharge: 2018-03-07 | Disposition: A | Discharge status: Home / Self Care | Payer: Medicare Other | Source: Ambulatory Visit | Attending: Neurosurgery | Admitting: Neurosurgery

## 2018-03-07 ENCOUNTER — Encounter (HOSPITAL_COMMUNITY)
Admission: RE | Disposition: A | Discharge status: Home / Self Care | Payer: Self-pay | Source: Ambulatory Visit | Attending: Neurosurgery

## 2018-03-07 ENCOUNTER — Ambulatory Visit (HOSPITAL_COMMUNITY): Payer: Medicare Other | Admitting: Physician Assistant

## 2018-03-07 ENCOUNTER — Ambulatory Visit (HOSPITAL_COMMUNITY): Payer: Medicare Other | Admitting: Certified Registered Nurse Anesthetist

## 2018-03-07 ENCOUNTER — Ambulatory Visit (HOSPITAL_COMMUNITY): Payer: Medicare Other

## 2018-03-07 ENCOUNTER — Encounter (HOSPITAL_COMMUNITY): Payer: Self-pay

## 2018-03-07 DIAGNOSIS — Z79899 Other long term (current) drug therapy: Secondary | ICD-10-CM | POA: Insufficient documentation

## 2018-03-07 DIAGNOSIS — M5127 Other intervertebral disc displacement, lumbosacral region: Secondary | ICD-10-CM | POA: Diagnosis not present

## 2018-03-07 DIAGNOSIS — F17210 Nicotine dependence, cigarettes, uncomplicated: Secondary | ICD-10-CM | POA: Insufficient documentation

## 2018-03-07 DIAGNOSIS — Z8249 Family history of ischemic heart disease and other diseases of the circulatory system: Secondary | ICD-10-CM | POA: Insufficient documentation

## 2018-03-07 DIAGNOSIS — G5603 Carpal tunnel syndrome, bilateral upper limbs: Secondary | ICD-10-CM | POA: Diagnosis not present

## 2018-03-07 DIAGNOSIS — K219 Gastro-esophageal reflux disease without esophagitis: Secondary | ICD-10-CM | POA: Diagnosis not present

## 2018-03-07 DIAGNOSIS — M5126 Other intervertebral disc displacement, lumbar region: Secondary | ICD-10-CM | POA: Diagnosis not present

## 2018-03-07 DIAGNOSIS — I10 Essential (primary) hypertension: Secondary | ICD-10-CM | POA: Insufficient documentation

## 2018-03-07 HISTORY — PX: LUMBAR LAMINECTOMY/DECOMPRESSION MICRODISCECTOMY: SHX5026

## 2018-03-07 LAB — LIPID PANEL
CHOLESTEROL TOTAL: 345 mg/dL — AB (ref 100–199)
Chol/HDL Ratio: 11.1 ratio — ABNORMAL HIGH (ref 0.0–5.0)
HDL: 31 mg/dL — ABNORMAL LOW (ref >39)
TRIGLYCERIDES: 1099 mg/dL — AB (ref 0–149)

## 2018-03-07 LAB — COMPREHENSIVE METABOLIC PANEL
A/G RATIO: 2 (ref 1.2–2.2)
ALT: 57 IU/L — ABNORMAL HIGH (ref 0–44)
AST: 40 IU/L (ref 0–40)
Albumin: 4.5 g/dL (ref 3.5–5.5)
Alkaline Phosphatase: 39 IU/L (ref 39–117)
BUN/Creatinine Ratio: 10 (ref 9–20)
BUN: 8 mg/dL (ref 6–20)
Bilirubin Total: 0.2 mg/dL (ref 0.0–1.2)
CALCIUM: 9.3 mg/dL (ref 8.7–10.2)
CO2: 22 mmol/L (ref 20–29)
Chloride: 96 mmol/L (ref 96–106)
Creatinine, Ser: 0.8 mg/dL (ref 0.76–1.27)
GFR, EST AFRICAN AMERICAN: 131 mL/min/{1.73_m2} (ref >59)
GFR, EST NON AFRICAN AMERICAN: 113 mL/min/{1.73_m2} (ref >59)
Globulin, Total: 2.3 g/dL (ref 1.5–4.5)
Glucose: 104 mg/dL — ABNORMAL HIGH (ref 65–99)
POTASSIUM: 4.6 mmol/L (ref 3.5–5.2)
Sodium: 135 mmol/L (ref 134–144)
TOTAL PROTEIN: 6.8 g/dL (ref 6.0–8.5)

## 2018-03-07 LAB — TSH: TSH: 1.09 u[IU]/mL (ref 0.450–4.500)

## 2018-03-07 LAB — VITAMIN D 25 HYDROXY (VIT D DEFICIENCY, FRACTURES): Vit D, 25-Hydroxy: 15.1 ng/mL — ABNORMAL LOW (ref 30.0–100.0)

## 2018-03-07 LAB — MAGNESIUM: MAGNESIUM: 1.9 mg/dL (ref 1.6–2.3)

## 2018-03-07 LAB — HEMOGLOBIN A1C
Est. average glucose Bld gHb Est-mCnc: 114 mg/dL
Hgb A1c MFr Bld: 5.6 % (ref 4.8–5.6)

## 2018-03-07 LAB — T4, FREE: Free T4: 0.95 ng/dL (ref 0.82–1.77)

## 2018-03-07 LAB — VITAMIN B12: Vitamin B-12: 861 pg/mL (ref 232–1245)

## 2018-03-07 SURGERY — LUMBAR LAMINECTOMY/DECOMPRESSION MICRODISCECTOMY 1 LEVEL
Anesthesia: General | Laterality: Right

## 2018-03-07 MED ORDER — ONDANSETRON HCL 4 MG/2ML IJ SOLN
INTRAMUSCULAR | Status: DC | PRN
  Administered 2018-03-07: 4 mg via INTRAVENOUS

## 2018-03-07 MED ORDER — MENTHOL 3 MG MT LOZG: 1.0000 | LOZENGE | OROMUCOSAL | Status: DC | PRN

## 2018-03-07 MED ORDER — AMLODIPINE BESYLATE 5 MG PO TABS: 10.0000 mg | ORAL_TABLET | Freq: Every day | ORAL | Status: DC

## 2018-03-07 MED ORDER — MIDAZOLAM HCL 2 MG/2ML IJ SOLN
INTRAMUSCULAR | Status: AC
  Filled 2018-03-07: qty 2

## 2018-03-07 MED ORDER — PHENOL 1.4 % MT LIQD: 1.0000 | OROMUCOSAL | Status: DC | PRN

## 2018-03-07 MED ORDER — THROMBIN 5000 UNITS EX SOLR
CUTANEOUS | Status: DC | PRN
  Administered 2018-03-07 (×2): 5000 [IU] via TOPICAL

## 2018-03-07 MED ORDER — KETOROLAC TROMETHAMINE 15 MG/ML IJ SOLN: 15.0000 mg | Freq: Four times a day (QID) | INTRAMUSCULAR | Status: DC

## 2018-03-07 MED ORDER — LIDOCAINE 2% (20 MG/ML) 5 ML SYRINGE
INTRAMUSCULAR | Status: AC
  Filled 2018-03-07: qty 5

## 2018-03-07 MED ORDER — DIAZEPAM 5 MG PO TABS
5.0000 mg | ORAL_TABLET | Freq: Four times a day (QID) | ORAL | Status: DC | PRN
  Administered 2018-03-07: 5 mg via ORAL

## 2018-03-07 MED ORDER — IRBESARTAN 300 MG PO TABS: 300.0000 mg | ORAL_TABLET | Freq: Every day | ORAL | Status: DC

## 2018-03-07 MED ORDER — OMEGA-3-ACID ETHYL ESTERS 1 G PO CAPS
2.0000 g | ORAL_CAPSULE | Freq: Two times a day (BID) | ORAL | Status: DC
  Filled 2018-03-07: qty 2

## 2018-03-07 MED ORDER — ACETAMINOPHEN 325 MG PO TABS: 650.0000 mg | ORAL_TABLET | ORAL | Status: DC | PRN

## 2018-03-07 MED ORDER — HYDROCODONE-ACETAMINOPHEN 7.5-325 MG PO TABS: 1.0000 | ORAL_TABLET | Freq: Four times a day (QID) | ORAL | Status: DC

## 2018-03-07 MED ORDER — POTASSIUM CHLORIDE IN NACL 20-0.9 MEQ/L-% IV SOLN: INTRAVENOUS | Status: DC

## 2018-03-07 MED ORDER — HEMOSTATIC AGENTS (NO CHARGE) OPTIME
TOPICAL | Status: DC | PRN
  Administered 2018-03-07: 1 via TOPICAL

## 2018-03-07 MED ORDER — 0.9 % SODIUM CHLORIDE (POUR BTL) OPTIME
TOPICAL | Status: DC | PRN
  Administered 2018-03-07: 1000 mL

## 2018-03-07 MED ORDER — THROMBIN 5000 UNITS EX SOLR
CUTANEOUS | Status: AC
  Filled 2018-03-07: qty 10000

## 2018-03-07 MED ORDER — HYDROCODONE-ACETAMINOPHEN 5-325 MG PO TABS: 1.0000 | ORAL_TABLET | Freq: Four times a day (QID) | ORAL | 0 refills | Status: DC | PRN

## 2018-03-07 MED ORDER — TIZANIDINE HCL 4 MG PO TABS: 4.0000 mg | ORAL_TABLET | Freq: Four times a day (QID) | ORAL | 0 refills | Status: DC | PRN

## 2018-03-07 MED ORDER — ONDANSETRON HCL 4 MG PO TABS: 4.0000 mg | ORAL_TABLET | Freq: Four times a day (QID) | ORAL | Status: DC | PRN

## 2018-03-07 MED ORDER — SUGAMMADEX SODIUM 200 MG/2ML IV SOLN
INTRAVENOUS | Status: AC
  Filled 2018-03-07: qty 2

## 2018-03-07 MED ORDER — OLMESARTAN-AMLODIPINE-HCTZ 40-10-25 MG PO TABS: 1.0000 | ORAL_TABLET | Freq: Every day | ORAL | Status: DC

## 2018-03-07 MED ORDER — LORATADINE 10 MG PO TABS: 10.0000 mg | ORAL_TABLET | Freq: Every day | ORAL | Status: DC

## 2018-03-07 MED ORDER — DEXAMETHASONE SODIUM PHOSPHATE 10 MG/ML IJ SOLN
INTRAMUSCULAR | Status: AC
  Filled 2018-03-07: qty 1

## 2018-03-07 MED ORDER — ONDANSETRON HCL 4 MG/2ML IJ SOLN
INTRAMUSCULAR | Status: AC
  Filled 2018-03-07: qty 2

## 2018-03-07 MED ORDER — PROPOFOL 10 MG/ML IV BOLUS
INTRAVENOUS | Status: DC | PRN
  Administered 2018-03-07: 200 mg via INTRAVENOUS
  Administered 2018-03-07: 20 mg via INTRAVENOUS
  Administered 2018-03-07: 30 mg via INTRAVENOUS

## 2018-03-07 MED ORDER — LIDOCAINE-EPINEPHRINE 0.5 %-1:200000 IJ SOLN
INTRAMUSCULAR | Status: DC | PRN
  Administered 2018-03-07: 10 mL

## 2018-03-07 MED ORDER — SODIUM CHLORIDE 0.9 % IV SOLN
INTRAVENOUS | Status: DC | PRN
  Administered 2018-03-07: 25 ug/min via INTRAVENOUS

## 2018-03-07 MED ORDER — ROCURONIUM BROMIDE 50 MG/5ML IV SOSY
PREFILLED_SYRINGE | INTRAVENOUS | Status: AC
  Filled 2018-03-07: qty 20

## 2018-03-07 MED ORDER — OXYCODONE HCL 5 MG PO TABS
5.0000 mg | ORAL_TABLET | Freq: Once | ORAL | Status: AC | PRN
  Administered 2018-03-07: 5 mg via ORAL

## 2018-03-07 MED ORDER — MIDAZOLAM HCL 5 MG/5ML IJ SOLN
INTRAMUSCULAR | Status: DC | PRN
  Administered 2018-03-07 (×2): 2 mg via INTRAVENOUS

## 2018-03-07 MED ORDER — VITAMIN B-6 100 MG PO TABS: 100.0000 mg | ORAL_TABLET | Freq: Every day | ORAL | Status: DC

## 2018-03-07 MED ORDER — ATORVASTATIN CALCIUM 20 MG PO TABS: 80.0000 mg | ORAL_TABLET | Freq: Every day | ORAL | Status: DC

## 2018-03-07 MED ORDER — LACTATED RINGERS IV SOLN
INTRAVENOUS | Status: DC
  Administered 2018-03-07 (×2): via INTRAVENOUS

## 2018-03-07 MED ORDER — HYDROCODONE-ACETAMINOPHEN 10-325 MG PO TABS: 2.0000 | ORAL_TABLET | ORAL | Status: DC | PRN

## 2018-03-07 MED ORDER — CEFAZOLIN SODIUM-DEXTROSE 2-4 GM/100ML-% IV SOLN
2.0000 g | INTRAVENOUS | Status: AC
  Administered 2018-03-07: 2 g via INTRAVENOUS
  Filled 2018-03-07: qty 100

## 2018-03-07 MED ORDER — HYDROCODONE-ACETAMINOPHEN 5-325 MG PO TABS: 1.0000 | ORAL_TABLET | ORAL | Status: DC | PRN

## 2018-03-07 MED ORDER — HYDROCHLOROTHIAZIDE 25 MG PO TABS: 25.0000 mg | ORAL_TABLET | Freq: Every day | ORAL | Status: DC

## 2018-03-07 MED ORDER — SUGAMMADEX SODIUM 200 MG/2ML IV SOLN
INTRAVENOUS | Status: DC | PRN
  Administered 2018-03-07: 150 mg via INTRAVENOUS

## 2018-03-07 MED ORDER — FENTANYL CITRATE (PF) 100 MCG/2ML IJ SOLN
INTRAMUSCULAR | Status: AC
  Filled 2018-03-07: qty 2

## 2018-03-07 MED ORDER — PROPOFOL 10 MG/ML IV BOLUS
INTRAVENOUS | Status: AC
  Filled 2018-03-07: qty 20

## 2018-03-07 MED ORDER — SODIUM CHLORIDE 0.9 % IV SOLN: 250.0000 mL | INTRAVENOUS | Status: DC

## 2018-03-07 MED ORDER — ISONIAZID 300 MG PO TABS: 900.0000 mg | ORAL_TABLET | ORAL | Status: DC

## 2018-03-07 MED ORDER — LIDOCAINE-EPINEPHRINE 0.5 %-1:200000 IJ SOLN
INTRAMUSCULAR | Status: AC
  Filled 2018-03-07: qty 1

## 2018-03-07 MED ORDER — ONDANSETRON HCL 4 MG/2ML IJ SOLN: 4.0000 mg | Freq: Four times a day (QID) | INTRAMUSCULAR | Status: DC | PRN

## 2018-03-07 MED ORDER — FENTANYL CITRATE (PF) 100 MCG/2ML IJ SOLN
INTRAMUSCULAR | Status: DC | PRN
  Administered 2018-03-07: 50 ug via INTRAVENOUS
  Administered 2018-03-07 (×2): 100 ug via INTRAVENOUS
  Administered 2018-03-07: 50 ug via INTRAVENOUS
  Administered 2018-03-07: 100 ug via INTRAVENOUS

## 2018-03-07 MED ORDER — ONDANSETRON HCL 4 MG/2ML IJ SOLN: 4.0000 mg | Freq: Once | INTRAMUSCULAR | Status: DC | PRN

## 2018-03-07 MED ORDER — ROCURONIUM BROMIDE 10 MG/ML (PF) SYRINGE
PREFILLED_SYRINGE | INTRAVENOUS | Status: DC | PRN
  Administered 2018-03-07: 20 mg via INTRAVENOUS
  Administered 2018-03-07: 30 mg via INTRAVENOUS
  Administered 2018-03-07: 50 mg via INTRAVENOUS

## 2018-03-07 MED ORDER — SODIUM CHLORIDE 0.9% FLUSH: 3.0000 mL | INTRAVENOUS | Status: DC | PRN

## 2018-03-07 MED ORDER — SODIUM CHLORIDE 0.9% FLUSH: 3.0000 mL | Freq: Two times a day (BID) | INTRAVENOUS | Status: DC

## 2018-03-07 MED ORDER — OXYCODONE HCL 5 MG PO TABS
ORAL_TABLET | ORAL | Status: AC
  Filled 2018-03-07: qty 1

## 2018-03-07 MED ORDER — ACETAMINOPHEN 650 MG RE SUPP: 650.0000 mg | RECTAL | Status: DC | PRN

## 2018-03-07 MED ORDER — FENTANYL CITRATE (PF) 100 MCG/2ML IJ SOLN: 25.0000 ug | INTRAMUSCULAR | Status: DC | PRN

## 2018-03-07 MED ORDER — DIAZEPAM 5 MG PO TABS
ORAL_TABLET | ORAL | Status: AC
  Filled 2018-03-07: qty 1

## 2018-03-07 MED ORDER — DEXAMETHASONE SODIUM PHOSPHATE 10 MG/ML IJ SOLN
INTRAMUSCULAR | Status: DC | PRN
  Administered 2018-03-07: 10 mg via INTRAVENOUS

## 2018-03-07 MED ORDER — EPHEDRINE SULFATE-NACL 50-0.9 MG/10ML-% IV SOSY
PREFILLED_SYRINGE | INTRAVENOUS | Status: DC | PRN
  Administered 2018-03-07: 5 mg via INTRAVENOUS

## 2018-03-07 MED ORDER — VITAMIN D (ERGOCALCIFEROL) 1.25 MG (50000 UNIT) PO CAPS: 50000.0000 [IU] | ORAL_CAPSULE | ORAL | Status: DC

## 2018-03-07 MED ORDER — FENTANYL CITRATE (PF) 250 MCG/5ML IJ SOLN
INTRAMUSCULAR | Status: AC
  Filled 2018-03-07: qty 5

## 2018-03-07 MED ORDER — OXYCODONE HCL 5 MG/5ML PO SOLN: 5.0000 mg | Freq: Once | ORAL | Status: AC | PRN

## 2018-03-07 MED ORDER — DEXMEDETOMIDINE HCL 200 MCG/2ML IV SOLN
INTRAVENOUS | Status: DC | PRN
  Administered 2018-03-07: 8 ug via INTRAVENOUS
  Administered 2018-03-07 (×3): 4 ug via INTRAVENOUS

## 2018-03-07 MED ORDER — RIFAPENTINE 150 MG PO TABS: 900.0000 mg | ORAL_TABLET | ORAL | Status: DC

## 2018-03-07 MED ORDER — LIDOCAINE 20MG/ML (2%) 15 ML SYRINGE OPTIME
INTRAMUSCULAR | Status: DC | PRN
  Administered 2018-03-07: 100 mg via INTRAVENOUS

## 2018-03-07 MED ORDER — GABAPENTIN 300 MG PO CAPS: 300.0000 mg | ORAL_CAPSULE | Freq: Three times a day (TID) | ORAL | Status: DC

## 2018-03-07 MED ORDER — OLOPATADINE HCL 0.1 % OP SOLN
1.0000 [drp] | Freq: Two times a day (BID) | OPHTHALMIC | Status: DC
  Filled 2018-03-07: qty 5

## 2018-03-07 MED ORDER — AMITRIPTYLINE HCL 50 MG PO TABS
50.0000 mg | ORAL_TABLET | Freq: Every day | ORAL | Status: DC
  Filled 2018-03-07: qty 1

## 2018-03-07 SURGICAL SUPPLY — 46 items
BENZOIN TINCTURE PRP APPL 2/3 (GAUZE/BANDAGES/DRESSINGS) IMPLANT
BLADE CLIPPER SURG (BLADE) IMPLANT
BUR MATCHSTICK NEURO 3.0 LAGG (BURR) ×3 IMPLANT
BUR PRECISION FLUTE 5.0 (BURR) IMPLANT
CANISTER SUCT 3000ML PPV (MISCELLANEOUS) ×3 IMPLANT
CARTRIDGE OIL MAESTRO DRILL (MISCELLANEOUS) ×1 IMPLANT
CLOSURE WOUND 1/2 X4 (GAUZE/BANDAGES/DRESSINGS)
COVER WAND RF STERILE (DRAPES) ×3 IMPLANT
DECANTER SPIKE VIAL GLASS SM (MISCELLANEOUS) ×3 IMPLANT
DERMABOND ADVANCED (GAUZE/BANDAGES/DRESSINGS) ×2
DERMABOND ADVANCED .7 DNX12 (GAUZE/BANDAGES/DRESSINGS) ×1 IMPLANT
DIFFUSER DRILL AIR PNEUMATIC (MISCELLANEOUS) ×3 IMPLANT
DRAPE LAPAROTOMY 100X72X124 (DRAPES) ×3 IMPLANT
DRAPE MICROSCOPE LEICA (MISCELLANEOUS) ×3 IMPLANT
DRAPE SURG 17X23 STRL (DRAPES) ×3 IMPLANT
DURAPREP 26ML APPLICATOR (WOUND CARE) ×3 IMPLANT
ELECT REM PT RETURN 9FT ADLT (ELECTROSURGICAL) ×3
ELECTRODE REM PT RTRN 9FT ADLT (ELECTROSURGICAL) ×1 IMPLANT
GAUZE 4X4 16PLY RFD (DISPOSABLE) IMPLANT
GAUZE SPONGE 4X4 12PLY STRL (GAUZE/BANDAGES/DRESSINGS) IMPLANT
GLOVE ECLIPSE 6.5 STRL STRAW (GLOVE) ×3 IMPLANT
GLOVE EXAM NITRILE XL STR (GLOVE) IMPLANT
GOWN STRL REUS W/ TWL LRG LVL3 (GOWN DISPOSABLE) ×2 IMPLANT
GOWN STRL REUS W/ TWL XL LVL3 (GOWN DISPOSABLE) IMPLANT
GOWN STRL REUS W/TWL 2XL LVL3 (GOWN DISPOSABLE) IMPLANT
GOWN STRL REUS W/TWL LRG LVL3 (GOWN DISPOSABLE) ×4
GOWN STRL REUS W/TWL XL LVL3 (GOWN DISPOSABLE)
KIT BASIN OR (CUSTOM PROCEDURE TRAY) ×3 IMPLANT
KIT TURNOVER KIT B (KITS) ×3 IMPLANT
NEEDLE HYPO 25X1 1.5 SAFETY (NEEDLE) ×3 IMPLANT
NEEDLE SPNL 18GX3.5 QUINCKE PK (NEEDLE) IMPLANT
NS IRRIG 1000ML POUR BTL (IV SOLUTION) ×3 IMPLANT
OIL CARTRIDGE MAESTRO DRILL (MISCELLANEOUS) ×3
PACK LAMINECTOMY NEURO (CUSTOM PROCEDURE TRAY) ×3 IMPLANT
PAD ARMBOARD 7.5X6 YLW CONV (MISCELLANEOUS) ×9 IMPLANT
RUBBERBAND STERILE (MISCELLANEOUS) ×6 IMPLANT
SPONGE LAP 4X18 RFD (DISPOSABLE) IMPLANT
SPONGE SURGIFOAM ABS GEL SZ50 (HEMOSTASIS) ×3 IMPLANT
STRIP CLOSURE SKIN 1/2X4 (GAUZE/BANDAGES/DRESSINGS) IMPLANT
SUT VIC AB 0 CT1 18XCR BRD8 (SUTURE) ×1 IMPLANT
SUT VIC AB 0 CT1 8-18 (SUTURE) ×2
SUT VIC AB 2-0 CT1 18 (SUTURE) ×3 IMPLANT
SUT VIC AB 3-0 SH 8-18 (SUTURE) ×3 IMPLANT
TOWEL GREEN STERILE (TOWEL DISPOSABLE) ×3 IMPLANT
TOWEL GREEN STERILE FF (TOWEL DISPOSABLE) ×3 IMPLANT
WATER STERILE IRR 1000ML POUR (IV SOLUTION) ×3 IMPLANT

## 2018-03-07 NOTE — Discharge Summary (Signed)
Physician Discharge Summary  Patient ID: Louis Carney MRN: [id number] DOB/AGE: 1981/04/27 37 y.o.  Admit date: 03/07/2018 Discharge date: 03/07/2018  Admission Diagnoses:HNP Right L5/S!  Discharge Diagnoses: same Active Problems:   HNP (herniated nucleus pulposus), lumbar   Discharged Condition: good  Hospital Course: Louis Carney was taken to the operating room and underwent an uncomplicated lumbar discetomy at L5/S1 on the right side. Post op he is ambulating, voiding, and tolerating a regular diet. His wound is clean, dry, and without signs of infection at discharge  Treatments: surgery: as above  Discharge Exam: Blood pressure 105/85, pulse 80, temperature (!) 97.4 F (36.3 C), temperature source Oral, resp. rate 18, height 5\' 8"  (1.727 m), weight 98.1 kg, SpO2 97 %. General appearance: alert, cooperative, appears stated age and no distress Neurologic: Alert and oriented X 3, normal strength and tone. Normal symmetric reflexes. Normal coordination and gait  Disposition: Discharge disposition: 01-Home or Self Care      Displacement of lumbosacral intervertebral disc  Allergies as of 03/07/2018   No Known Allergies     Medication List    TAKE these medications   acetaminophen-codeine 300-30 MG tablet Commonly known as:  TYLENOL #3 Take 1 tablet by mouth every 6 (six) hours as needed for moderate pain.   amitriptyline 25 MG tablet Commonly known as:  ELAVIL Take 2 tablets (50 mg total) by mouth at bedtime. For sleep as needed.   atorvastatin 80 MG tablet Commonly known as:  LIPITOR Take 1 tablet (80 mg total) by mouth at bedtime.   fexofenadine 180 MG tablet Commonly known as:  ALLEGRA Take 1 tablet (180 mg total) by mouth daily. What changed:    when to take this  reasons to take this   HYDROcodone-acetaminophen 5-325 MG tablet Commonly known as:  NORCO/VICODIN Take 1 tablet by mouth every 6 (six) hours as needed for moderate pain.   ibuprofen 200 MG  tablet Commonly known as:  ADVIL,MOTRIN Take 800 mg by mouth 3 (three) times daily as needed for moderate pain.   isoniazid 300 MG tablet Commonly known as:  NYDRAZID Take 3 tablets (900 mg total) by mouth once a week. What changed:  when to take this   Olmesartan-amLODIPine-HCTZ 40-10-25 MG Tabs 1QD What changed:    how much to take  how to take this  when to take this  additional instructions   olopatadine 0.1 % ophthalmic solution Commonly known as:  PATANOL Place 1 drop into both eyes 2 (two) times daily.   omega-3 acid ethyl esters 1 g capsule Commonly known as:  LOVAZA Take 2 capsules (2 g total) by mouth 2 (two) times daily.   pyridOXINE 100 MG tablet Commonly known as:  VITAMIN B-6 Take 100 mg by mouth daily.   ranitidine 150 MG tablet Commonly known as:  ZANTAC Take 300 mg by mouth 2 (two) times daily.   Rifapentine 150 MG Tabs Take 6 tablets (900 mg total) by mouth once a week. What changed:  when to take this   tiZANidine 4 MG tablet Commonly known as:  ZANAFLEX Take 1 tablet (4 mg total) by mouth every 6 (six) hours as needed for muscle spasms.   Vitamin D (Ergocalciferol) 1.25 MG (50000 UT) Caps capsule Commonly known as:  DRISDOL Take 1 tab Wednesday and Sunday each week.      Follow-up Information    Ashok Pall, MD Follow up in 3 week(s).   Specialty:  Neurosurgery Why:  please call the office to  make an appointment Contact information: [place]. [address] [place] [address] [phone]           Signed: Winfield Cunas 03/07/2018, 4:45 PM

## 2018-03-07 NOTE — Progress Notes (Signed)
Patient is discharged from room 3C11 at this time. Alert and in stable condition. IV site d/c'd and instructions read to patient and spouse with understanding verbalized. Left unit via wheelchair with all belongings at side. 

## 2018-03-07 NOTE — Anesthesia Postprocedure Evaluation (Signed)
Anesthesia Post Note  Patient: Louis Carney  Procedure(s) Performed: Right Lumbar 5 Sacral 1 Microdiscectomy (Right )     Patient location during evaluation: PACU Anesthesia Type: General Level of consciousness: awake Pain management: pain level controlled Vital Signs Assessment: post-procedure vital signs reviewed and stable Respiratory status: spontaneous breathing Cardiovascular status: stable Postop Assessment: no apparent nausea or vomiting Anesthetic complications: no    Last Vitals:  Vitals:   03/07/18 1100 03/07/18 1456  BP: 132/87   Pulse: 80   Resp: 18   Temp: 37.1 C 37 C  SpO2: 97%     Last Pain:  Vitals:   03/07/18 1100  TempSrc: Oral  PainSc:                  Mukhtar Shams

## 2018-03-07 NOTE — Transfer of Care (Signed)
Immediate Anesthesia Transfer of Care Note  Patient: Louis Carney  Procedure(s) Performed: Right Lumbar 5 Sacral 1 Microdiscectomy (Right )  Patient Location: PACU  Anesthesia Type:General  Level of Consciousness: drowsy and patient cooperative  Airway & Oxygen Therapy: Patient Spontanous Breathing and Patient connected to face mask oxygen  Post-op Assessment: Report given to RN and Post -op Vital signs reviewed and stable  Post vital signs: Reviewed and stable  Last Vitals:  Vitals Value Taken Time  BP 99/68 03/07/2018  2:52 PM  Temp    Pulse 88 03/07/2018  2:55 PM  Resp 14 03/07/2018  2:55 PM  SpO2 92 % 03/07/2018  2:55 PM  Vitals shown include unvalidated device data.  Last Pain:  Vitals:   03/07/18 1100  TempSrc: Oral  PainSc:       Patients Stated Pain Goal: 3 (23/34/35 6861)  Complications: No apparent anesthesia complications

## 2018-03-07 NOTE — H&P (Signed)
BP 132/87   Pulse 80   Temp 98.7 F (37.1 C) (Oral)   Resp 18   Ht 5\' 8"  (1.727 m)   Wt 98.1 kg   SpO2 97%   BMI 32.87 kg/m  Louis Carney is a 37 year old gentleman, currently on disability, who has been treated for cervical myelopathy via cervical arthroplasty at C5-6 while in [place].  He had that operation approximately 3 years ago.  He says this symptomatic presentation started in January of 2016.  He has burning and numbness on the right side of his body.  He has profound numbness in his hands and he shakes them all the time.  He has very poor coordination in the lower extremities.  He is still able to button his shirt, though.  He does feel slightly weak.  He says he cannot run because his coordination is not good enough and he fell the last time he tried to run.  He is unable to work anymore.  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  He has plaque psoriasis, for which he is also being seen and evaluated.  He says that he cannot walk unassisted.  He has to be pushing on a shopping cart or leaning on something.  He can barely get through without extreme burning in the lower extremities bilaterally the longer he stands or walks.  He has urinary urgency.  He has sexual dysfunction; unable to maintain an erection.  These are all ongoing problems.   SOCIAL HISTORY: He does smoke; has a 27-pack-year history.  He does drink alcohol daily.  He says he has no history of substance abuse.   PAST MEDICAL HISTORY: He does have hypertension.   REVIEW OF SYSTEMS: Positive for tinnitus, balance problems, anosmia, sinus problem, hypercholesterolemia, swelling in the feet, leg pain with walking, difficulty with urinary stream, neck pain, arm weakness, leg weakness, back pain, arm pain, leg pain, joint pain, problems with coordination in the lower extremities.  He has headaches.  He has had multiple episodes of  urinary incontinence.   FAMILY HISTORY: Mother 54, is in good health. Father 19, is in good health.  Diabetes and hypertension present in the family history.   PHYSICAL EXAMINATION: Vital signs:  He is 5 feet 6 inches.  He weighs 214 pounds.  Blood pressure is 160/118, pulse is 78, temperature is 98.1.  Musculoskeletal:  On exam, he has 2+ reflexes at the knees and ankles.  Some mild clonus.  He has clonus detected in both lower extremities.  In the upper extremities, he does have spread from his finger flexors from brachioradialis reflex.  Biceps reflex, triceps reflex both intact about 2+.  Romberg is negative.  He is unable to tandem walk.    Neurologic:  Pupils equal, round, react to light.  Full extraocular movements.  Full visual fields.  Hearing intact to voice.  Uvula elevates midline.  Shoulder shrug is normal.  Tongue protrudes in midline.  He has multiple plaques all over his hands, knees and elbows.  He has been seen by a dermatologist for that.   DATA: He has absolutely no studies.   ASSESSMENT AND PLAN: I will send him for an MRI of the cervical spine, lumbar spine, plain x-rays of the cervical spine and lumbar spine, an EMG of both the upper and lower extremities.  I also believe he has  carpal tunnel bilaterally.  He has had markedly positive Tinel sign in both wrists.  Louis Carney returns today for an EMG nerve conduction study.  He has severe carpal tunnel syndrome in his right hand.  I do believe this is the reason for the ongoing problem he is having there.  He did not have an ulnar neuropathy.  He would like to proceed with carpal tunnel release.  We went over the risks of the operation, damage to the bone, damage to the hand, no relief of pain.  I showed him a diagram and explained the procedure.  He understands and wishes to proceed.  We will try to get him scheduled as quickly as possible.      Vital signs:  He weighs 212 pounds, temperature is 98.6, blood pressure 152/89,  pulse 78, pain is 6/10.

## 2018-03-07 NOTE — Anesthesia Procedure Notes (Signed)
Procedure Name: Intubation Date/Time: 03/07/2018 1:05 PM Performed by: Gwyndolyn Saxon, CRNA Pre-anesthesia Checklist: Patient identified, Emergency Drugs available, Suction available and Patient being monitored Patient Re-evaluated:Patient Re-evaluated prior to induction Oxygen Delivery Method: Circle system utilized Preoxygenation: Pre-oxygenation with 100% oxygen Induction Type: IV induction Ventilation: Mask ventilation without difficulty and Oral airway inserted - appropriate to patient size Laryngoscope Size: Mac and 3 Grade View: Grade I Tube type: Oral Tube size: 7.5 mm Number of attempts: 1 Airway Equipment and Method: Stylet Placement Confirmation: ETT inserted through vocal cords under direct vision,  positive ETCO2 and breath sounds checked- equal and bilateral Secured at: 23 cm Tube secured with: Tape Dental Injury: Teeth and Oropharynx as per pre-operative assessment

## 2018-03-07 NOTE — Op Note (Signed)
03/07/2018  3:00 PM  PATIENT:  Louis Carney  37 y.o. male  PRE-OPERATIVE DIAGNOSIS:  Displacement of lumbosacral intervertebral disc L5/S1, right  POST-OPERATIVE DIAGNOSIS:  displacement of lumbosacral intervertebral disc L5/S1, right  PROCEDURE:  Procedure(s): Right Lumbar 5 Sacral 1 Microdiscectomy  SURGEON:   Surgeon(s): Ashok Pall, MD  ASSISTANTS:none  ANESTHESIA:   general  EBL:  Total I/O In: 1400 [I.V.:1400] Out: 25 [Blood:25]  BLOOD ADMINISTERED:none    COUNT:per nursing  DRAINS: none   SPECIMEN:  No Specimen  DICTATION: [name] was taken to the operating room, intubated and placed under a general anesthetic without difficulty. He  was positioned prone on a Wilson frame with all pressure points padded. His back was prepped and draped in a sterile manner. I opened the skin with a 10 blade and carried the dissection down to the thoracolumbar fascia. I used both sharp dissection and the monopolar cautery to expose the lamina of L5, and S1. I confirmed my location with an intraoperative xray.  I used the drill, Kerrison punches, and curettes to perform a semihemilaminectomy of L5. I used the punches to remove the ligamentum flavum to expose the thecal sac. I brought the microscope into the operative field and  started the decompression of the spinal canal, thecal sac and S1 root(s). I cauterized epidural veins overlying the disc space then divided them sharply. I opened the disc space with a 15 blade and proceeded with the discectomy. I used pituitary rongeurs, curettes, and other instruments to remove disc material. After the discectomy was completed i inspected the S1 nerve root and felt it was well decompressed. I explored rostrally, laterally, medially, and caudally and was satisfied with the decompression. I irrigated the wound, then closed in layers. I approximated the thoracolumbar fascia, subcutaneous, and subcuticular planes with vicryl sutures. I used dermabond  for a sterile dressing.   PLAN OF CARE: Admit for overnight observation  PATIENT DISPOSITION:  PACU - hemodynamically stable.   Delay start of Pharmacological VTE agent (>24hrs) due to surgical blood loss or risk of bleeding:  yes

## 2018-03-08 ENCOUNTER — Encounter (HOSPITAL_COMMUNITY): Payer: Self-pay | Admitting: Neurosurgery

## 2018-03-10 ENCOUNTER — Telehealth: Payer: Self-pay

## 2018-03-10 MED FILL — Thrombin For Soln 5000 Unit: CUTANEOUS | Qty: 2 | Status: AC

## 2018-03-10 NOTE — Telephone Encounter (Signed)
Zantac was only recalled at certain doses and certain lot numbers etc.  -I recommend, if patient agrees to it, we send in ranitidine 300 mg prescription.  Sig: Take 1/2 tablet twice daily.  Dispense 90, 1 refill  -Also, tell patient to speak with the pharmacist about which medicines were affected by the recall of Zantac.  Please let me know if this is not okay with patient otherwise have him follow-up as previously discussed

## 2018-03-10 NOTE — Telephone Encounter (Signed)
Patient has been using Zantac for his reflux until the recall.  Patient has switched to [facility] patient states that using this has caused abd bloating and would like to know what else he should try or if there is anything to take to decrease the bloating. LOV 01/27/2018.  Please review and advise. MPulliam, CMA/RT(R)

## 2018-03-11 MED ORDER — RANITIDINE HCL 300 MG PO TABS: 150.0000 mg | ORAL_TABLET | Freq: Two times a day (BID) | ORAL | 1 refills | Status: DC

## 2018-03-11 NOTE — Telephone Encounter (Signed)
Called left message for patient to call the office. MPulliam, CMA/RT(R)  

## 2018-03-11 NOTE — Telephone Encounter (Signed)
Sent in RX per note and patient notified. MPulliam, CMA/RT(R)

## 2018-03-12 ENCOUNTER — Telehealth: Payer: Self-pay

## 2018-03-12 ENCOUNTER — Ambulatory Visit: Payer: Medicare Other | Admitting: Family Medicine

## 2018-03-12 NOTE — Telephone Encounter (Signed)
Pharmacy is unable to fill Zantac 300 mg per backorder due to the recall.  Requesting a change in medication. Please review and advise. MPulliam, CMA/RT(R)

## 2018-03-13 NOTE — Telephone Encounter (Signed)
Recommend pt take OTC pepcid. Take 1 tab 45 min prior to two largest meals of the day.  Max 2 tabs/ day

## 2018-03-13 NOTE — Telephone Encounter (Signed)
Patient notified. MPulliam, CMA/RT(R)  

## 2018-03-18 LAB — LDL CHOLESTEROL, DIRECT: LDL Direct: 137 mg/dL — ABNORMAL HIGH (ref 0–99)

## 2018-03-18 LAB — SPECIMEN STATUS REPORT

## 2018-03-31 ENCOUNTER — Ambulatory Visit (INDEPENDENT_AMBULATORY_CARE_PROVIDER_SITE_OTHER): Payer: Medicare Other | Admitting: Family Medicine

## 2018-03-31 ENCOUNTER — Encounter: Payer: Self-pay | Admitting: Family Medicine

## 2018-03-31 VITALS — BP 156/100 | HR 84 | Temp 98.6°F | Ht 68.0 in | Wt 224.0 lb

## 2018-03-31 DIAGNOSIS — E781 Pure hyperglyceridemia: Secondary | ICD-10-CM

## 2018-03-31 DIAGNOSIS — M5416 Radiculopathy, lumbar region: Secondary | ICD-10-CM

## 2018-03-31 DIAGNOSIS — E559 Vitamin D deficiency, unspecified: Secondary | ICD-10-CM

## 2018-03-31 DIAGNOSIS — E7800 Pure hypercholesterolemia, unspecified: Secondary | ICD-10-CM | POA: Diagnosis not present

## 2018-03-31 DIAGNOSIS — Z789 Other specified health status: Secondary | ICD-10-CM

## 2018-03-31 DIAGNOSIS — Z791 Long term (current) use of non-steroidal anti-inflammatories (NSAID): Secondary | ICD-10-CM

## 2018-03-31 DIAGNOSIS — F39 Unspecified mood [affective] disorder: Secondary | ICD-10-CM

## 2018-03-31 DIAGNOSIS — G9050 Complex regional pain syndrome I, unspecified: Secondary | ICD-10-CM

## 2018-03-31 DIAGNOSIS — G4700 Insomnia, unspecified: Secondary | ICD-10-CM

## 2018-03-31 DIAGNOSIS — E786 Lipoprotein deficiency: Secondary | ICD-10-CM

## 2018-03-31 DIAGNOSIS — R748 Abnormal levels of other serum enzymes: Secondary | ICD-10-CM

## 2018-03-31 DIAGNOSIS — F1599 Other stimulant use, unspecified with unspecified stimulant-induced disorder: Secondary | ICD-10-CM

## 2018-03-31 DIAGNOSIS — I10 Essential (primary) hypertension: Secondary | ICD-10-CM

## 2018-03-31 DIAGNOSIS — G8929 Other chronic pain: Secondary | ICD-10-CM

## 2018-03-31 MED ORDER — OMEGA-3-ACID ETHYL ESTERS 1 G PO CAPS: 2.0000 g | ORAL_CAPSULE | Freq: Two times a day (BID) | ORAL | 3 refills | Status: DC

## 2018-03-31 MED ORDER — VITAMIN D (ERGOCALCIFEROL) 1.25 MG (50000 UNIT) PO CAPS: ORAL_CAPSULE | ORAL | 10 refills | Status: DC

## 2018-03-31 NOTE — Patient Instructions (Addendum)
Louis Carney, please make sure you are checking your blood pressure at home, bring your BP log\what your blood pressures are running at home next office visit for our review.    -Also, if you are taking medications differently than what is printed on your medication list at the end of this summary, please call us and let us know so we can make the appropriate changes in your chart.  -Also it is important you try to cut back on the cigarettes, caffeine and spicy foods etc. to help with your reflux.    Steps to Quit Smoking Smoking tobacco can be bad for your health. It can also affect almost every organ in your body. Smoking puts you and people around you at risk for many serious long-lasting (chronic) diseases. Quitting smoking is hard, but it is one of the best things that you can do for your health. It is never too late to quit. What are the benefits of quitting smoking? When you quit smoking, you lower your risk for getting serious diseases and conditions. They can include:  Lung cancer or lung disease.  Heart disease.  Stroke.  Heart attack.  Not being able to have children (infertility).  Weak bones (osteoporosis) and broken bones (fractures).  If you have coughing, wheezing, and shortness of breath, those symptoms may get better when you quit. You may also get sick less often. If you are pregnant, quitting smoking can help to lower your chances of having a baby of low birth weight. What can I do to help me quit smoking? Talk with your doctor about what can help you quit smoking. Some things you can do (strategies) include:  Quitting smoking totally, instead of slowly cutting back how much you smoke over a period of time.  Going to in-person counseling. You are more likely to quit if you go to many counseling sessions.  Using resources and support systems, such as: ? Database administrator with a Social worker. ? Phone quitlines. ? Careers information officer. ? Support groups or group  counseling. ? Text messaging programs. ? Mobile phone apps or applications.  Taking medicines. Some of these medicines may have nicotine in them. If you are pregnant or breastfeeding, do not take any medicines to quit smoking unless your doctor says it is okay. Talk with your doctor about counseling or other things that can help you.  Talk with your doctor about using more than one strategy at the same time, such as taking medicines while you are also going to in-person counseling. This can help make quitting easier. What things can I do to make it easier to quit? Quitting smoking might feel very hard at first, but there is a lot that you can do to make it easier. Take these steps:  Talk to your family and friends. Ask them to support and encourage you.  Call phone quitlines, reach out to support groups, or work with a Social worker.  Ask people who smoke to not smoke around you.  Avoid places that make you want (trigger) to smoke, such as: ? Bars. ? Parties. ? Smoke-break areas at work.  Spend time with people who do not smoke.  Lower the stress in your life. Stress can make you want to smoke. Try these things to help your stress: ? Getting regular exercise. ? Deep-breathing exercises. ? Yoga. ? Meditating. ? Doing a body scan. To do this, close your eyes, focus on one area of your body at a time from head to toe, and notice  which parts of your body are tense. Try to relax the muscles in those areas.  Download or buy apps on your mobile phone or tablet that can help you stick to your quit plan. There are many free apps, such as QuitGuide from the [employer] Office manager for Disease Control and Prevention). You can find more support from smokefree.gov and other websites.  This information is not intended to replace advice given to you by your health care provider. Make sure you discuss any questions you have with your health care provider. Document Released: 02/10/2009 Document Revised: 12/13/2015  Document Reviewed: 08/31/2014 Elsevier Interactive Patient Education  2018 [place] for a Low Cholesterol, Low Saturated Fat Diet  Fats - Limit total intake of fats and oils. - Avoid butter, stick margarine, shortening, lard, palm and coconut oils. - Limit mayonnaise, salad dressings, gravies and sauces, unless they are homemade with low-fat ingredients. - Limit chocolate. - Choose low-fat and nonfat products, such as low-fat mayonnaise, low-fat or non-hydrogenated peanut butter, low-fat or fat-free salad dressings and nonfat gravy. - Use vegetable oil, such as canola or olive oil. - Look for margarine that does not contain trans fatty acids. - Use nuts in moderate amounts. - Read ingredient labels carefully to determine both amount and type of fat present in foods. Limit saturated and trans fats! - Avoid high-fat processed and convenience foods.  Meats and Meat Alternatives - Choose fish, chicken, [place] and lean meats. - Use dried beans, peas, lentils and tofu. - Limit egg yolks to three to four per week. - If you eat red meat, limit to no more than three servings per week and choose loin or round cuts. - Avoid fatty meats, such as bacon, sausage, franks, luncheon meats and ribs. - Avoid all organ meats, including liver.  Dairy - Choose nonfat or low-fat milk, yogurt and cottage cheese. - Most cheeses are high in fat. Choose cheeses made from non-fat milk, such as mozzarella and ricotta cheese. - Choose light or fat-free cream cheese and sour cream. - Avoid cream and sauces made with cream.  Fruits and Vegetables - Eat a wide variety of fruits and vegetables. - Use lemon juice, vinegar or "mist" olive oil on vegetables. - Avoid adding sauces, fat or oil to vegetables.  Breads, Cereals and Grains - Choose whole-grain breads, cereals, pastas and rice. - Avoid high-fat snack foods, such as granola, cookies, pies, pastries, doughnuts and  croissants.  Cooking Tips - Avoid deep fried foods. - Trim visible fat off meats and remove skin from poultry before cooking. - Bake, broil, boil, poach or roast poultry, fish and lean meats. - Drain and discard fat that drains out of meat as you cook it. - Add little or no fat to foods. - Use vegetable oil sprays to grease pans for cooking or baking. - Steam vegetables. - Use herbs or no-oil marinades to flavor foods.

## 2018-03-31 NOTE — Progress Notes (Signed)
Assessment and plan:  1. poorly controlled HTN   2. Elevated LDL cholesterol level   3. Hypertriglyceridemia   4. Mood disorder ([place])- secondary to chronic pain syndrome   5. 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 NSAID long-term use-4 ibuprofen 3 times daily times at least 5 years   7. Chronic radicular low back pain- R sided   8. Drinks beer- 6-8 beers/d on ave ( 10 Yrs or so)   9. Insomnia, unspecified type   10. RSD (reflex sympathetic dystrophy)- right entire leg painful to non-noxious stimuli   11. Elevated liver enzymes   12. Vitamin D deficiency   13. Low level of high density lipoprotein (HDL)     1. Reviewed recent lab work (03/06/2018) in depth with patient today.  All lab work within normal limits unless otherwise noted.  Educated patient extensively today.  - Patient has not been compliant with his Lipitor or Vitamin D supplementation.  He never filled his Lovaza prescription.  - Educated patient extensively about critical importance of taking all medications as prescribed.  - Encouraged patient to begin multivitamin.  2. Vitamin D Deficiency = 15.1 last check - Please begin supplementation as prescribed.  - Discussed that Vitamin D deficiency can contribute to general fatigue and feelings of aching in the joints.  - Reviewed that patient needs to take Vitamin D daily. - Re-check in six months, after taking medication every day.  3. Elevated Liver Enzymes - AST down to 40 from 80 last check. - ALT elevated to 57. - Need for re-check as recommended.  Will continue to monitor.  4. Poorly Controlled HTN - Blood pressure elevated on intake today. - Per patient, his blood pressure is always elevated when he is in pain, but he has been compliant with BP meds.  - Continue treatment plan as prescribed.  See med list below. - Patient tolerating meds well without complication.   Denies S-E  - Lifestyle changes such as dash diet and engaging in a regular exercise program discussed with patient.  Educational handouts provided  - Ambulatory BP monitoring encouraged. Keep log and bring in next OV.  5. Insomnia, Mood disorder ([place])- secondary to chronic pain syndrome - Managed well on Elavil.  Stable at this time. - Continue treatment plan as prescribed, one tablet of Elavil per night.  See med list below.  - Discussed that patient may take his Elavil slightly earlier if needed to prevent grogginess.  - Patient tolerating meds well without complication.  Denies S-E.  - Reviewed the "spokes of the wheel" of mood and health management.  Stressed the importance of ongoing prudent habits, including regular exercise, appropriate sleep hygiene, healthful dietary habits, and prayer/meditation to relax.  6. Hyperlipidemia Lipid Panel: Triglycerides = 1,099, extremely elevated. HDL = 31, low. LDL = 137, elevated.  - LDL is not at goal, remains elevated at 137. - Discussed critical need to resume medication as prescribed.  See med list below. - Take Lipitor and Lovaza as prescribed.  - Discussed critical importance of compliance with treatment plan, along with improved lifestyle habits and prudent dietary choices.  - Dietary changes such as low saturated & trans fat and low carb/ ketogenic diets discussed with patient.  Encouraged regular exercise and weight loss when appropriate.   - Educational handouts provided at patient's desire.  7. GERD - Formerly managed on Zantac. - Advised patient to use famotidine instead of ranitidine, as  recommended.  - Encouraged patient to discontinue omeprazole and begin Pepcid OTC.  8. Constipation (due to pain meds) - Encouraged patient to continue metamucil for relief.  9. Specialty Care - Dermatology, Lumbar Surgery - Discussed need for patient to follow up with specialists as prescribed and recommended.  - Advised patient to  make sure that he informs his specialists about the results of his recent lab work.  10. BMI Counseling Explained to patient what BMI refers to, and what it means medically.    Told patient to think about it as a "medical risk stratification measurement" and how increasing BMI is associated with increasing risk/ or worsening state of various diseases such as hypertension, hyperlipidemia, diabetes, premature OA, depression etc.  American Heart Association guidelines for healthy diet, basically Mediterranean diet, and exercise guidelines of 30 minutes 5 days per week or more discussed in detail.  Health counseling performed.  All questions answered.  11. Lifestyle & Preventative Health Maintenance - Advised patient to continue working toward exercising to improve overall mental, physical, and emotional health.    - Encouraged patient to engage in daily physical activity, especially a formal exercise routine.  Recommended that the patient eventually strive for at least 150 minutes of moderate cardiovascular activity per week according to guidelines established by the [facility].   - Healthy dietary habits encouraged, including low-carb, and high amounts of lean protein in diet.   - Patient should also consume adequate amounts of water.   Education and routine counseling performed. Handouts provided.  Pt was in the office today for 32.5+ minutes, with over 50% time spent in face to face counseling of patients various medical conditions, treatment plans of those medical conditions including medicine management and lifestyle modification, strategies to improve health and well being; and in coordination of care. SEE ABOVE TREATMENT PLAN FOR DETAILS   Orders Placed This Encounter  Procedures  . Lipid panel  . VITAMIN D 25 Hydroxy (Vit-D Deficiency, Fractures)  . ALT  . AST    Meds ordered this encounter  Medications  . omega-3 acid ethyl esters (LOVAZA) 1 g capsule    Sig: Take 2 capsules (2 g  total) by mouth 2 (two) times daily.    Dispense:  180 capsule    Refill:  3  . Vitamin D, Ergocalciferol, (DRISDOL) 1.25 MG (50000 UT) CAPS capsule    Sig: Take 1 tab Wednesday and Sunday each week.    Dispense:  24 capsule    Refill:  10    Medications Discontinued During This Encounter  Medication Reason  . acetaminophen-codeine (TYLENOL #3) 300-30 MG tablet Completed Course  . omega-3 acid ethyl esters (LOVAZA) 1 g capsule Reorder  . Vitamin D, Ergocalciferol, (DRISDOL) 50000 units CAPS capsule Reorder      Return for f/up 72mo FBW, then 2) OV with me 3-5d later; BRING BP LOG.   Anticipatory guidance and routine counseling done re: condition, txmnt options and need for follow up. All questions of patient's were answered.   Gross side effects, risk and benefits, and alternatives of medications discussed with patient.  Patient is aware that all medications have potential side effects and we are unable to predict every sideeffect or drug-drug interaction that may occur.  Expresses verbal understanding and consents to current therapy plan and treatment regiment.  Please see AVS handed out to patient at the end of our visit for additional patient instructions/ counseling done pertaining to today's office visit.  Note:  This document was prepared  using Systems analyst and may include unintentional dictation errors.    This document serves as a record of services personally performed by Mellody Dance, DO. It was created on her behalf by Toni Amend, a trained medical scribe. The creation of this record is based on the scribe's personal observations and the provider's statements to them.   I have reviewed the above medical documentation for accuracy and completeness and I concur.  Mellody Dance, DO 04/09/2018 6:42 AM      -----------------------------------------------------------------------------------  Subjective:   CC:   Louis Carney is a 37  y.o. male who presents to [place] at [facility] today for review and discussion of recent bloodwork that was done in addition to f/up on chronic conditions we are managing for pt.  1. All recent blood work that we ordered was reviewed with patient today.  Patient was counseled on all abnormalities and we discussed dietary and lifestyle changes that could help those values (also medications when appropriate).  Extensive health counseling performed and all patient's concerns/ questions were addressed.  See labs below and also plan for more details of these abnormalities  H/o Vitamin B Supplementation Patient was taking B12 and B6 in the past, but discontinued due to stomach upset.  Decreased Alcohol Consumption Is now drinking about 3 beers every other night.  Zantac Use Notes Zantac helps with his heartburn and "flushing him out."  He used to take six ranitidine and twelve ibuprofen daily.  Notes that he felt less bloated on the Zantac.  He is now taking omeprazole.  He has felt more constipated off of Zantac, and has been taking metamucil to help alleviate his constipation.  Feels his pain meds have been constipating him and remarks that he's gained 15 lbs since his last visit.  Caffeine Use/Energy Drinks He has cut out [employer].    Now he's using "Bangs."  Vitamin D Supplementation Patient has not been taking Vitamin D.  Insomnia Was started on Elavil for insomnia last appointment.  This helped with insomnia and chronic pain.  States that it helped "too much."  It kept him groggy until 12 the next day.  Notes he reduced to 1.  Still groggy in the morning, but sleeping through the night.  Was up every 3-4 hours before starting the Elavil.  Patient feels less groggy on one tablet nightly.  Lumbar Spine Surgery Recently had surgery.  Patient is still having radicular pain even though his specialist liked the way the surgery healed.  Patient states that he was put on  a "new prescription pill" to help with nerve pain.  Notes "it's brand new" but cannot remember the name of medication.  Takes one painkiller (Vicodin, hydrocodone-acetaminophen) every six hours.  States "it works to an extent, but I don't want to overdo it."  1. HTN HPI:  -  His blood pressure has been controlled at home, per patient's report when he is not in pain.  Pt is sometimes checking it at home.   States that his blood pressure was perfect a couple of days ago.  He was not in pain then, but now experiencing pain every day.  His BP was 124/83 the other night at 9 o'clock PM, prior to his surgery.  He was not having pain at that time.  Now with healing his pain has gotten worse.  He feels that his blood pressure is always worse when he is in pain.  - Patient reports good compliance  with blood pressure medications.  Notes he has been taking his blood pressure medication every morning.  - Denies medication S-E   - Smoking Status noted   - He denies new onset of: chest pain, exercise intolerance, shortness of breath, dizziness, visual changes, headache, lower extremity swelling or claudication.   Last 3 blood pressure readings in our office are as follows: BP Readings from Last 3 Encounters:  03/31/18 (!) 156/100  03/07/18 105/85  02/27/18 138/90    Filed Weights   03/31/18 1552  Weight: 224 lb (101.6 kg)    2. 37 y.o. male here for cholesterol follow-up.   Has been missing his Lipitor.  Reports that he takes them 3-4 times per week.  Feels he misses taking them about 50% of the time.  Denies medication S-E.  He never filled his Lovaza prescription and has not been taking it.   - Smoking Status noted   - He denies new onset of: chest pain, exercise intolerance, shortness of breath, dizziness, visual changes, headache, lower extremity swelling or claudication.   Continues having radicular neuropathy.  The cholesterol last visit was:  Lab Results  Component Value Date     CHOL 345 (H) 03/06/2018   HDL 31 (L) 03/06/2018   LDLCALC Comment 03/06/2018   LDLDIRECT 137 (H) 03/06/2018   TRIG 1,099 (HH) 03/06/2018   CHOLHDL 11.1 (H) 03/06/2018    Hepatic Function Latest Ref Rng & Units 03/06/2018 02/27/2018 01/29/2018  Total Protein 6.0 - 8.5 g/dL 6.8 6.9 7.3  Albumin 3.5 - 5.5 g/dL 4.5 4.3 -  AST 0 - 40 IU/L 40 80(H) 49(H)  ALT 0 - 44 IU/L 57(H) 35 36  Alk Phosphatase 39 - 117 IU/L 39 39 -  Total Bilirubin 0.0 - 1.2 mg/dL 0.2 2.3(H) 0.5     Wt Readings from Last 3 Encounters:  03/31/18 224 lb (101.6 kg)  03/07/18 216 lb 3.2 oz (98.1 kg)  02/27/18 216 lb 3.2 oz (98.1 kg)   BP Readings from Last 3 Encounters:  03/31/18 (!) 156/100  03/07/18 105/85  02/27/18 138/90   Pulse Readings from Last 3 Encounters:  03/31/18 84  03/07/18 80  02/27/18 100   BMI Readings from Last 3 Encounters:  03/31/18 34.06 kg/m  03/07/18 32.87 kg/m  02/27/18 32.87 kg/m     Patient Care Team    Relationship Specialty Notifications Start End  Mellody Dance, DO PCP - General Family Medicine  08/26/17     Full medical history updated and reviewed in the office today  Patient Active Problem List   Diagnosis Date Noted  . Elevated LDL cholesterol level 09/12/2017    Priority: High  . Hypertriglyceridemia 09/12/2017    Priority: High  . Cigarette smoker- 3 ppd for 6 yrs, then 1ppd for 9 yrs- 27 pk yr hx 08/26/2017    Priority: High  . HTN 08/26/2017    Priority: High  . RSD (reflex sympathetic dystrophy)- right entire leg painful to non-noxious stimuli 08/26/2017    Priority: Medium  . NSAID long-term use-4 ibuprofen 3 times daily times at least 5 years 08/26/2017    Priority: Medium  . Mood disorder ([place])- secondary to chronic pain syndrome 08/26/2017    Priority: Medium  . Insomnia 08/26/2017    Priority: Medium  . Family history of diabetes mellitus in father 09/12/2017    Priority: Low  . Psoriasis-onset age 6 has needed Cosentyx in past has failed  Enbrel and Humira 08/26/2017    Priority: Low  .  HNP (herniated nucleus pulposus), lumbar 03/07/2018  . Transaminitis 01/29/2018  . Medication monitoring encounter 11/29/2017  . TB lung, latent 11/06/2017  . High risk medication use 09/12/2017  . Vitamin D deficiency 09/12/2017  . Low level of high density lipoprotein (HDL) 09/12/2017  . Chronic radicular low back pain- R sided 08/26/2017  . History of excision of lamina of cervical vertebra for decompression of sp- C4-6inal cord 08/26/2017  . 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 Drinks beer- 6-8 beers/d on ave ( 10 Yrs or so) 08/26/2017  . 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 Chronic fatigue 08/26/2017  . Environmental and seasonal allergies 08/26/2017  . ED (erectile dysfunction) 08/26/2017  . Allergic conjunctivitis of both eyes and rhinitis 08/26/2017  . Obesity, Class I, BMI 30-34.9 08/26/2017  . Plaque psoriasis 08/26/2017    Past Medical History:  Diagnosis Date  . Arthritis   . GERD (gastroesophageal reflux disease)   . Headache    migraines  . Hypertension   . Psoriasis    since age 64 years  . Tuberculosis    latent TB infection on treatment    Past Surgical History:  Procedure Laterality Date  . CARPAL TUNNEL RELEASE Right 01/15/2018   Procedure: Right Carpal tunnel release;  Surgeon: Ashok Pall, MD;  Location: [facility];  Service: Neurosurgery;  Laterality: Right;  Right Carpal tunnel release  . CERVICAL SPINE SURGERY    . LUMBAR LAMINECTOMY/DECOMPRESSION MICRODISCECTOMY Right 03/07/2018   Procedure: Right Lumbar 5 Sacral 1 Microdiscectomy;  Surgeon: Ashok Pall, MD;  Location: [facility];  Service: Neurosurgery;  Laterality: Right;  Right Lumbar 5 Sacral 1 Microdiscectomy  . NECK SURGERY      Social History   Tobacco Use  . Smoking status: Current Every Day Smoker    Packs/day: 1.00    Years: 15.00    Pack  years: 15.00    Types: Cigarettes  . Smokeless tobacco: Never Used  Substance Use Topics  . Alcohol use: Yes    Alcohol/week: 24.0 standard drinks    Types: 24 Standard drinks or equivalent per week    Family Hx: Family History  Problem Relation Age of Onset  . Cancer Mother        LUNG  . Alcohol abuse Mother   . Hypertension Mother   . Alcohol abuse Father   . Hypertension Father      Medications: Current Outpatient Medications  Medication Sig Dispense Refill  . amitriptyline (ELAVIL) 25 MG tablet Take 2 tablets (50 mg total) by mouth at bedtime. For sleep as needed. (Patient taking differently: Take 25 mg by mouth at bedtime. For sleep as needed.) 180 tablet 0  . atorvastatin (LIPITOR) 80 MG tablet Take 1 tablet (80 mg total) by mouth at bedtime. 90 tablet 3  . fexofenadine (ALLEGRA) 180 MG tablet Take 1 tablet (180 mg total) by mouth daily. (Patient taking differently: Take 180 mg by mouth daily as needed for allergies. ) 90 tablet 3  . HYDROcodone-acetaminophen (NORCO/VICODIN) 5-325 MG tablet Take 1 tablet by mouth every 6 (six) hours as needed for moderate pain. 30 tablet 0  . ibuprofen (ADVIL,MOTRIN) 200 MG tablet Take 800 mg by mouth 3 (three) times daily as needed for moderate pain.     Marland Kitchen isoniazid (NYDRAZID) 300 MG tablet Take 3 tablets (900 mg total) by mouth once a week. (Patient taking differently: Take 900 mg by mouth every Wednesday. ) 12  tablet 1  . Olmesartan-amLODIPine-HCTZ 40-10-25 MG TABS 1QD (Patient taking differently: Take 1 tablet by mouth daily. ) 90 tablet 0  . olopatadine (PATANOL) 0.1 % ophthalmic solution Place 1 drop into both eyes 2 (two) times daily. 5 mL 11  . omeprazole (PRILOSEC) 20 MG capsule Take 20 mg by mouth daily.    Marland Kitchen pyridOXINE (VITAMIN B-6) 100 MG tablet Take 100 mg by mouth daily.    . ranitidine (ZANTAC) 300 MG tablet Take 0.5 tablets (150 mg total) by mouth 2 (two) times daily. 90 tablet 1  . Rifapentine 150 MG TABS Take 6 tablets (900  mg total) by mouth once a week. (Patient taking differently: Take 900 mg by mouth every Wednesday. ) 24 each 1  . tiZANidine (ZANAFLEX) 4 MG tablet Take 1 tablet (4 mg total) by mouth every 6 (six) hours as needed for muscle spasms. 60 tablet 0  . omega-3 acid ethyl esters (LOVAZA) 1 g capsule Take 2 capsules (2 g total) by mouth 2 (two) times daily. 180 capsule 3  . Vitamin D, Ergocalciferol, (DRISDOL) 1.25 MG (50000 UT) CAPS capsule Take 1 tab Wednesday and Sunday each week. 24 capsule 10   No current facility-administered medications for this visit.     Allergies:  No Known Allergies   Review of Systems: General:   No F/C, wt loss Pulm:   No DIB, SOB, pleuritic chest pain Card:  No CP, palpitations Abd:  No n/v/d or pain Ext:  No inc edema from baseline  Objective:  Blood pressure (!) 156/100, pulse 84, temperature 98.6 F (37 C), height _0  (1.727 m), weight 224 lb (101.6 kg), SpO2 100 %. Body mass index is 34.06 kg/m. Gen:   Well NAD, A and O *3 HEENT:    [place]/AT, EOMI,  MMM Lungs:   Normal work of breathing. CTA B/L, no Wh, rhonchi Heart:   RRR, S1, S2 WNL's, no MRG Abd:   No gross distention Exts:    warm, pink,  Brisk capillary refill, warm and well perfused.  Psych:    No HI/SI, judgement and insight good, Euthymic mood. Full Affect.   Recent Results (from the past 2160 hour(s))  COMPLETE METABOLIC PANEL WITH GFR     Status: Abnormal   Collection Time: 01/29/18  9:34 AM  Result Value Ref Range   Glucose, Bld 94 65 - 99 mg/dL    Comment: .            Fasting reference interval .    BUN 10 7 - 25 mg/dL   Creat 0.72 0.60 - 1.35 mg/dL   GFR, Est Non African American 118 > OR = 60 mL/min/1.78m   GFR, Est African American 137 > OR = 60 mL/min/1.786m  BUN/Creatinine Ratio NOT APPLICABLE 6 - 22 (calc)   Sodium 133 (L) 135 - 146 mmol/L   Potassium 4.4 3.5 - 5.3 mmol/L   Chloride 96 (L) 98 - 110 mmol/L   CO2 25 20 - 32 mmol/L   Calcium 9.5 8.6 - 10.3 mg/dL   Total  Protein 7.3 6.1 - 8.1 g/dL   Albumin 4.7 3.6 - 5.1 g/dL   Globulin 2.6 1.9 - 3.7 g/dL (calc)   AG Ratio 1.8 1.0 - 2.5 (calc)   Total Bilirubin 0.5 0.2 - 1.2 mg/dL   Alkaline phosphatase (APISO) 39 (L) 40 - 115 U/L   AST 49 (H) 10 - 40 U/L   ALT 36 9 - 46 U/L  Surgical pcr screen  Status: None   Collection Time: 02/27/18 10:55 AM  Result Value Ref Range   MRSA, PCR NEGATIVE NEGATIVE   Staphylococcus aureus NEGATIVE NEGATIVE    Comment: (NOTE) The Xpert SA Assay (FDA approved for NASAL specimens in patients 48 years of age and older), is one component of a comprehensive surveillance program. It is not intended to diagnose infection nor to guide or monitor treatment. Performed at [facility], [place] [address] [place] [address]   CBC     Status: None   Collection Time: 02/27/18 10:55 AM  Result Value Ref Range   WBC 7.9 4.0 - 10.5 K/uL   RBC 5.15 4.22 - 5.81 MIL/uL   Hemoglobin 15.9 13.0 - 17.0 g/dL   HCT 45.9 39.0 - 52.0 %   MCV 89.1 80.0 - 100.0 fL   MCH 30.9 26.0 - 34.0 pg   MCHC 34.6 30.0 - 36.0 g/dL   RDW 11.6 11.5 - 15.5 %   Platelets 300 150 - 400 K/uL   nRBC 0.0 0.0 - 0.2 %    Comment: Performed at [facility], [place] [address], [place], [place] [address]  Comprehensive metabolic panel     Status: Abnormal   Collection Time: 02/27/18 10:55 AM  Result Value Ref Range   Sodium 131 (L) 135 - 145 mmol/L    Comment: POST-ULTRACENTRIFUGATION HEMOLYSIS AT THIS LEVEL MAY AFFECT RESULT    Potassium 5.0 3.5 - 5.1 mmol/L    Comment: POST-ULTRACENTRIFUGATION HEMOLYSIS AT THIS LEVEL MAY AFFECT RESULT    Chloride 98 98 - 111 mmol/L    Comment: POST-ULTRACENTRIFUGATION HEMOLYSIS AT THIS LEVEL MAY AFFECT RESULT    CO2 20 (L) 22 - 32 mmol/L    Comment: POST-ULTRACENTRIFUGATION HEMOLYSIS AT THIS LEVEL MAY AFFECT RESULT    Glucose, Bld 108 (H) 70 - 99 mg/dL    Comment: POST-ULTRACENTRIFUGATION HEMOLYSIS AT THIS LEVEL MAY AFFECT RESULT    BUN 12 6 - 20  mg/dL   Creatinine, Ser 0.82 0.61 - 1.24 mg/dL    Comment: POST-ULTRACENTRIFUGATION HEMOLYSIS AT THIS LEVEL MAY AFFECT RESULT    Calcium 9.0 8.9 - 10.3 mg/dL    Comment: POST-ULTRACENTRIFUGATION   Total Protein 6.9 6.5 - 8.1 g/dL    Comment: POST-ULTRACENTRIFUGATION   Albumin 4.3 3.5 - 5.0 g/dL    Comment: POST-ULTRACENTRIFUGATION   AST 80 (H) 15 - 41 U/L    Comment: POST-ULTRACENTRIFUGATION HEMOLYSIS AT THIS LEVEL MAY AFFECT RESULT    ALT 35 0 - 44 U/L    Comment: POST-ULTRACENTRIFUGATION HEMOLYSIS AT THIS LEVEL MAY AFFECT RESULT    Alkaline Phosphatase 39 38 - 126 U/L    Comment: HEMOLYSIS AT THIS LEVEL MAY AFFECT RESULT   Total Bilirubin 2.3 (H) 0.3 - 1.2 mg/dL    Comment: POST-ULTRACENTRIFUGATION HEMOLYSIS AT THIS LEVEL MAY AFFECT RESULT    GFR calc non Af Amer >60 >60 mL/min   GFR calc Af Amer >60 >60 mL/min    Comment: (NOTE) The eGFR has been calculated using the CKD EPI equation. This calculation has not been validated in all clinical situations. eGFR's persistently <60 mL/min signify possible Chronic Kidney Disease.    Anion gap 13 5 - 15    Comment: Performed at [facility] [address], [place], [place] [address]  VITAMIN D 25 Hydroxy (Vit-D Deficiency, Fractures)     Status: Abnormal   Collection Time: 03/06/18  9:24 AM  Result Value Ref Range   Vit D, 25-Hydroxy 15.1 (  L) 30.0 - 100.0 ng/mL    Comment: Vitamin D deficiency has been defined by the [employer] practice guideline as a level of serum 25-OH vitamin D less than 20 ng/mL (1,2). The Endocrine Society went on to further define vitamin D insufficiency as a level between 21 and 29 ng/mL (2). 1. IOM (Institute of Medicine). 2010. Dietary reference    intakes for calcium and D. [place]: The    [employer]. 2. Holick MF, Binkley Ball, Bischoff-Ferrari HA, et al.    Evaluation, treatment, and prevention of vitamin D    deficiency: an Endocrine  Society clinical practice    guideline. JCEM. 2011 Jul; 96(7):1911-30.   TSH     Status: None   Collection Time: 03/06/18  9:24 AM  Result Value Ref Range   TSH 1.090 0.450 - 4.500 uIU/mL  T4, free     Status: None   Collection Time: 03/06/18  9:24 AM  Result Value Ref Range   Free T4 0.95 0.82 - 1.77 ng/dL  Vitamin B12     Status: None   Collection Time: 03/06/18  9:24 AM  Result Value Ref Range   Vitamin B-12 861 232 - 1,245 pg/mL  Magnesium     Status: None   Collection Time: 03/06/18  9:24 AM  Result Value Ref Range   Magnesium 1.9 1.6 - 2.3 mg/dL  Lipid panel     Status: Abnormal   Collection Time: 03/06/18  9:24 AM  Result Value Ref Range   Cholesterol, Total 345 (H) 100 - 199 mg/dL   Triglycerides 1,099 (HH) 0 - 149 mg/dL    Comment: Results confirmed on dilution.    HDL 31 (L) >39 mg/dL   VLDL Cholesterol Cal Comment 5 - 40 mg/dL    Comment: The calculation for the VLDL cholesterol is not valid when triglyceride level is >400 mg/dL.    LDL Calculated Comment 0 - 99 mg/dL    Comment: Triglyceride result indicated is too high for an accurate LDL cholesterol estimation.    Chol/HDL Ratio 11.1 (H) 0.0 - 5.0 ratio    Comment:                                   T. Chol/HDL Ratio                                             Men  Women                               1/2 Avg.Risk  3.4    3.3                                   Avg.Risk  5.0    4.4                                2X Avg.Risk  9.6    7.1  3X Avg.Risk 23.4   11.0   Hemoglobin A1c     Status: None   Collection Time: 03/06/18  9:24 AM  Result Value Ref Range   Hgb A1c MFr Bld 5.6 4.8 - 5.6 %    Comment:          Prediabetes: 5.7 - 6.4          Diabetes: >6.4          Glycemic control for adults with diabetes: <7.0    Est. average glucose Bld gHb Est-mCnc 114 mg/dL  Comprehensive metabolic panel     Status: Abnormal   Collection Time: 03/06/18  9:24 AM  Result Value Ref Range    Glucose 104 (H) 65 - 99 mg/dL   BUN 8 6 - 20 mg/dL   Creatinine, Ser 0.80 0.76 - 1.27 mg/dL   GFR calc non Af Amer 113 >59 mL/min/1.73   GFR calc Af Amer 131 >59 mL/min/1.73   BUN/Creatinine Ratio 10 9 - 20   Sodium 135 134 - 144 mmol/L   Potassium 4.6 3.5 - 5.2 mmol/L   Chloride 96 96 - 106 mmol/L   CO2 22 20 - 29 mmol/L   Calcium 9.3 8.7 - 10.2 mg/dL   Total Protein 6.8 6.0 - 8.5 g/dL   Albumin 4.5 3.5 - 5.5 g/dL   Globulin, Total 2.3 1.5 - 4.5 g/dL   Albumin/Globulin Ratio 2.0 1.2 - 2.2   Bilirubin Total 0.2 0.0 - 1.2 mg/dL   Alkaline Phosphatase 39 39 - 117 IU/L   AST 40 0 - 40 IU/L   ALT 57 (H) 0 - 44 IU/L  LDL cholesterol, direct     Status: Abnormal   Collection Time: 03/06/18  9:24 AM  Result Value Ref Range   LDL Direct 137 (H) 0 - 99 mg/dL  Specimen status report     Status: None   Collection Time: 03/06/18  9:24 AM  Result Value Ref Range   specimen status report Comment     Comment: Written Authorization Written Authorization Written Authorization Received. Authorization received from [place] 03-17-2018 Logged by Susie Cassette

## 2018-04-07 DIAGNOSIS — L409 Psoriasis, unspecified: Secondary | ICD-10-CM | POA: Diagnosis not present

## 2018-05-06 ENCOUNTER — Other Ambulatory Visit: Payer: Self-pay | Admitting: Neurosurgery

## 2018-05-06 DIAGNOSIS — M5127 Other intervertebral disc displacement, lumbosacral region: Secondary | ICD-10-CM

## 2018-05-15 ENCOUNTER — Ambulatory Visit
Admission: RE | Admit: 2018-05-15 | Discharge: 2018-05-15 | Disposition: A | Discharge status: Home / Self Care | Payer: Medicare Other | Source: Ambulatory Visit | Attending: Neurosurgery | Admitting: Neurosurgery

## 2018-05-15 DIAGNOSIS — M5127 Other intervertebral disc displacement, lumbosacral region: Secondary | ICD-10-CM

## 2018-05-15 DIAGNOSIS — M5126 Other intervertebral disc displacement, lumbar region: Secondary | ICD-10-CM | POA: Diagnosis not present

## 2018-05-15 DIAGNOSIS — M48061 Spinal stenosis, lumbar region without neurogenic claudication: Secondary | ICD-10-CM | POA: Diagnosis not present

## 2018-05-15 MED ORDER — GADOBENATE DIMEGLUMINE 529 MG/ML IV SOLN
20.0000 mL | Freq: Once | INTRAVENOUS | Status: AC | PRN
  Administered 2018-05-15: 20 mL via INTRAVENOUS

## 2018-06-03 DIAGNOSIS — G5621 Lesion of ulnar nerve, right upper limb: Secondary | ICD-10-CM | POA: Diagnosis not present

## 2018-06-03 DIAGNOSIS — G5601 Carpal tunnel syndrome, right upper limb: Secondary | ICD-10-CM | POA: Diagnosis not present

## 2018-06-03 DIAGNOSIS — G5620 Lesion of ulnar nerve, unspecified upper limb: Secondary | ICD-10-CM | POA: Insufficient documentation

## 2018-07-07 DIAGNOSIS — L409 Psoriasis, unspecified: Secondary | ICD-10-CM | POA: Diagnosis not present

## 2018-07-13 IMAGING — DX DG CHEST 2V
2 series · 2 of 2 positions shown · non-contrast
Comparison: None.

CLINICAL DATA: Patient with positive TB test.

EXAM:
CHEST - 2 VIEW

[dg chest 2 view (1 of 2)]
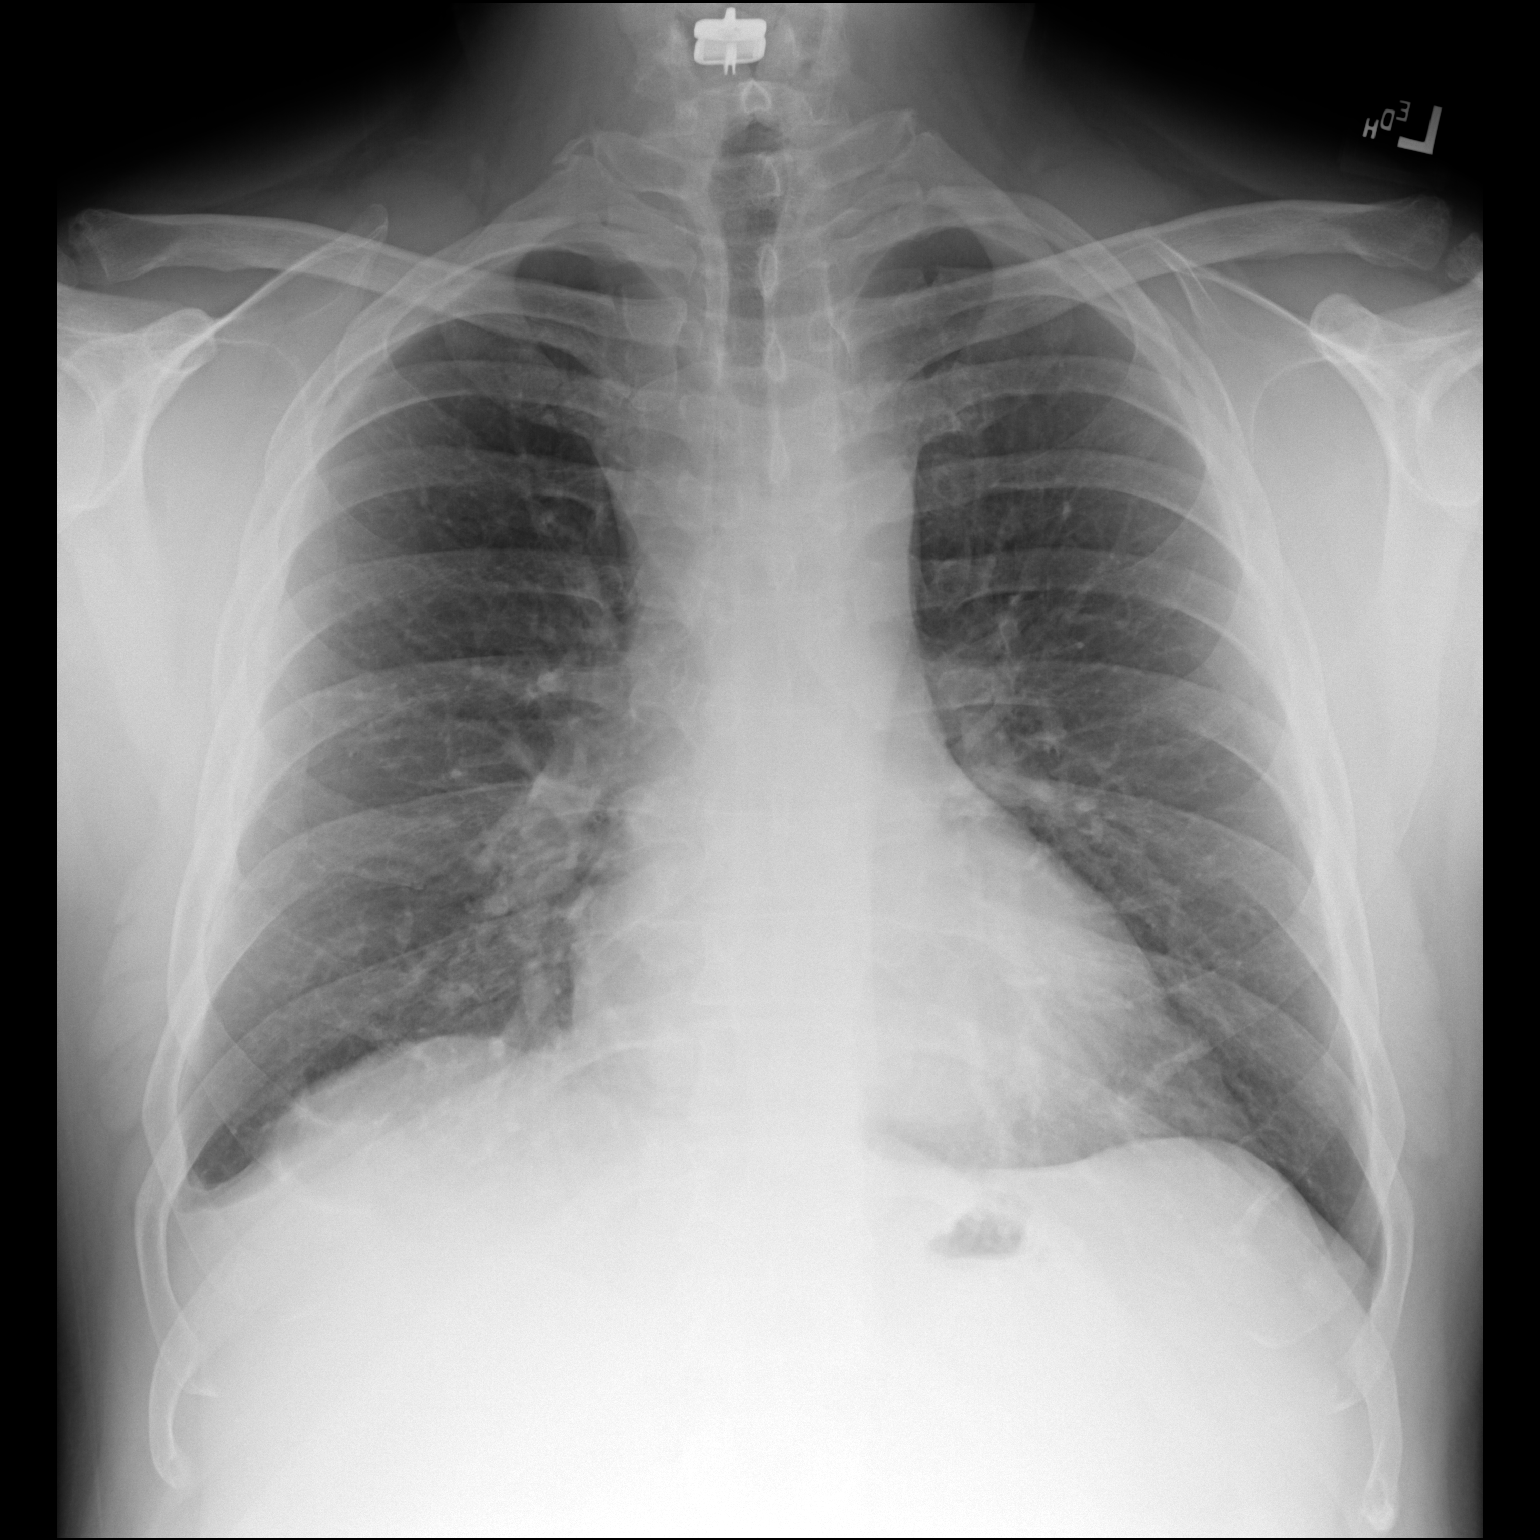

[dg chest 2 view (2 of 2)]
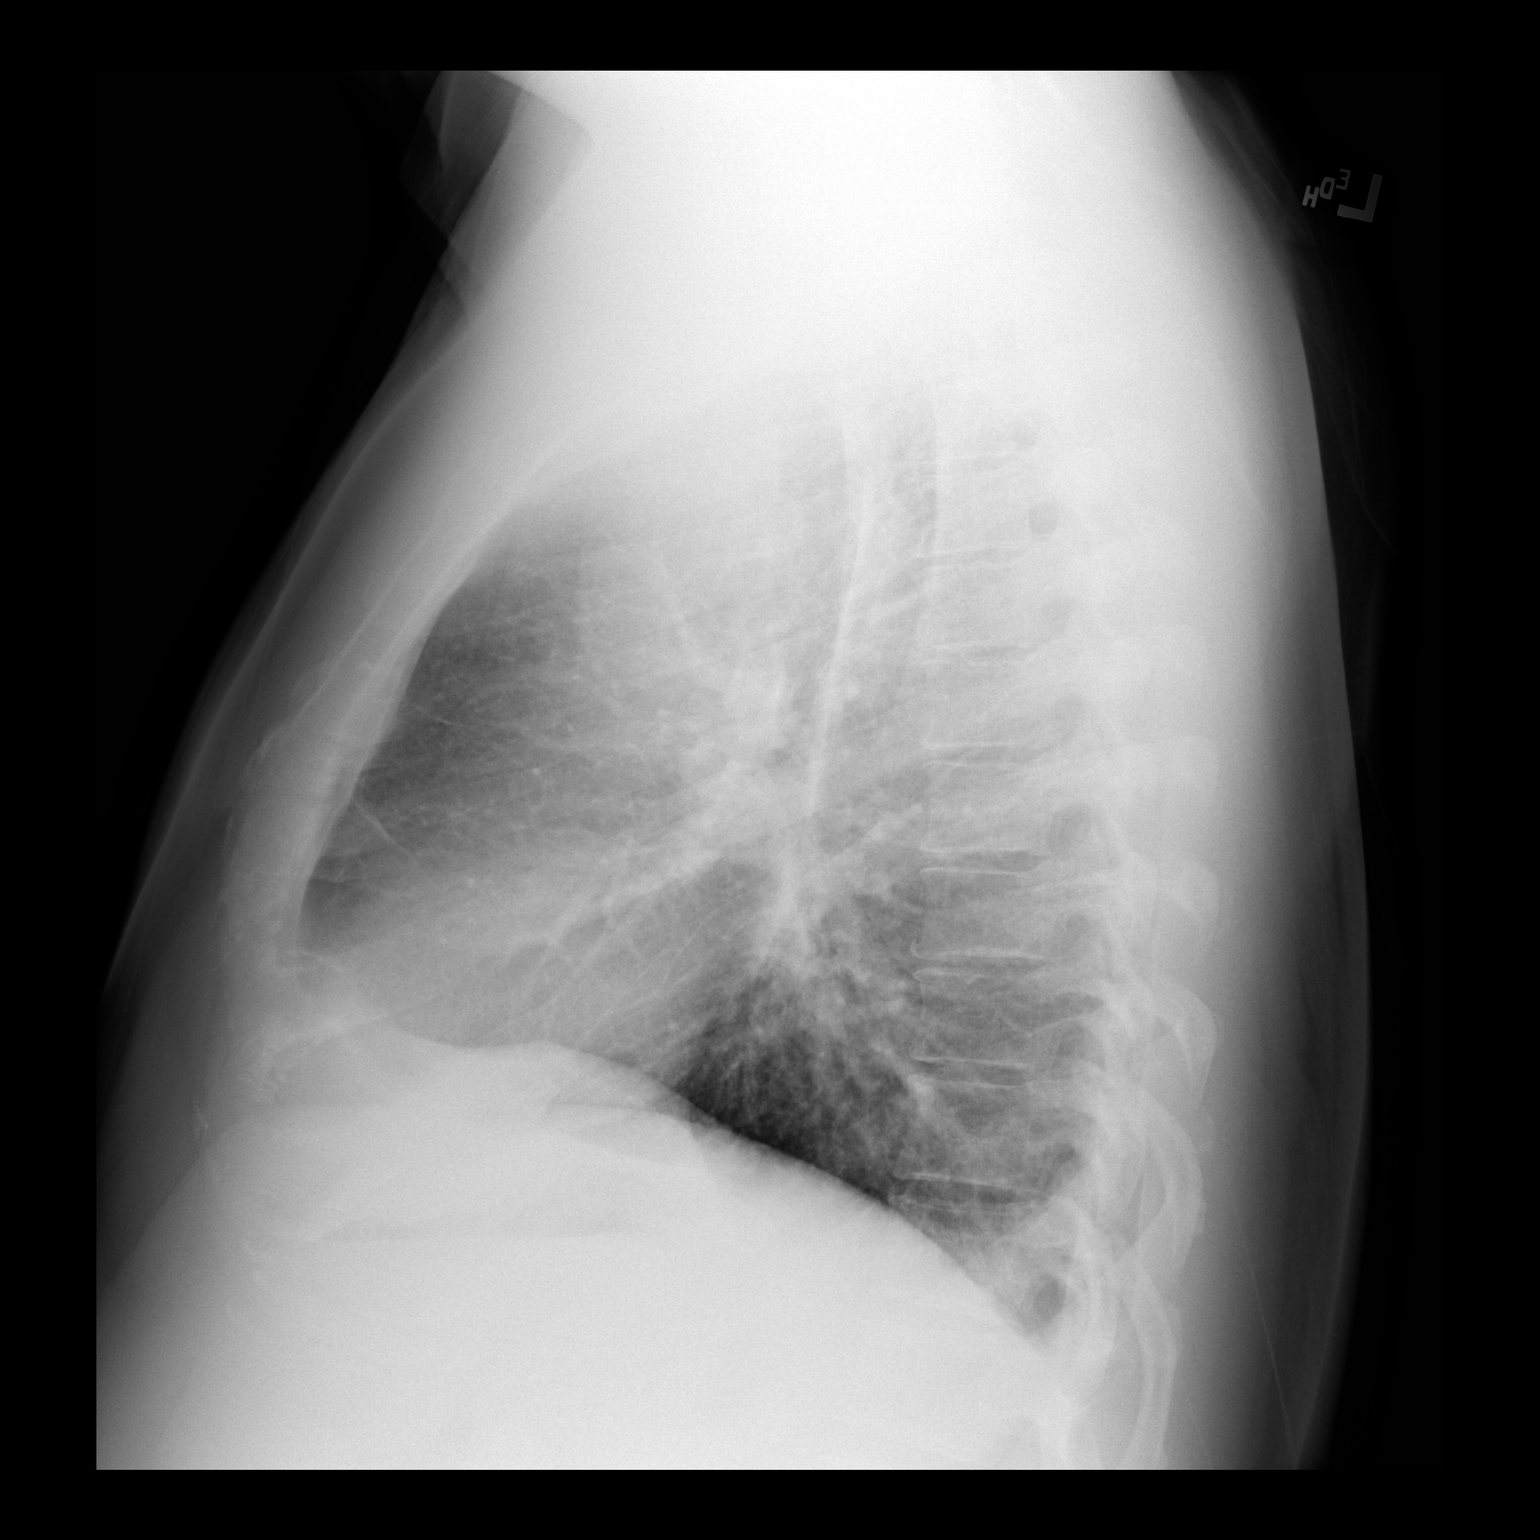

[2 of 2 positions shown; findings below may reference images not displayed]

FINDINGS: Normal cardiac and mediastinal contours. No consolidative pulmonary
opacities. No pleural effusion or pneumothorax. Regional skeleton is
unremarkable.
IMPRESSION: No acute cardiopulmonary process.

## 2018-07-28 ENCOUNTER — Other Ambulatory Visit: Payer: Self-pay

## 2018-07-28 ENCOUNTER — Other Ambulatory Visit: Payer: Medicare Other

## 2018-07-28 DIAGNOSIS — E559 Vitamin D deficiency, unspecified: Secondary | ICD-10-CM | POA: Diagnosis not present

## 2018-07-28 DIAGNOSIS — E7800 Pure hypercholesterolemia, unspecified: Secondary | ICD-10-CM

## 2018-07-28 DIAGNOSIS — Z789 Other specified health status: Secondary | ICD-10-CM | POA: Diagnosis not present

## 2018-07-28 DIAGNOSIS — R748 Abnormal levels of other serum enzymes: Secondary | ICD-10-CM | POA: Diagnosis not present

## 2018-07-28 DIAGNOSIS — E781 Pure hyperglyceridemia: Secondary | ICD-10-CM | POA: Diagnosis not present

## 2018-07-28 DIAGNOSIS — G5621 Lesion of ulnar nerve, right upper limb: Secondary | ICD-10-CM | POA: Diagnosis not present

## 2018-07-29 LAB — LIPID PANEL
Chol/HDL Ratio: 13.5 ratio — ABNORMAL HIGH (ref 0.0–5.0)
Cholesterol, Total: 324 mg/dL — ABNORMAL HIGH (ref 100–199)
HDL: 24 mg/dL — ABNORMAL LOW (ref >39)
TRIGLYCERIDES: 1363 mg/dL — AB (ref 0–149)

## 2018-07-29 LAB — AST: AST: 111 IU/L — AB (ref 0–40)

## 2018-07-29 LAB — VITAMIN D 25 HYDROXY (VIT D DEFICIENCY, FRACTURES): Vit D, 25-Hydroxy: 13.1 ng/mL — ABNORMAL LOW (ref 30.0–100.0)

## 2018-07-29 LAB — ALT: ALT: 103 IU/L — AB (ref 0–44)

## 2018-07-31 ENCOUNTER — Telehealth: Payer: Self-pay | Admitting: Family Medicine

## 2018-07-31 ENCOUNTER — Ambulatory Visit (INDEPENDENT_AMBULATORY_CARE_PROVIDER_SITE_OTHER): Payer: Medicare Other | Admitting: Family Medicine

## 2018-07-31 ENCOUNTER — Other Ambulatory Visit: Payer: Self-pay

## 2018-07-31 ENCOUNTER — Encounter: Payer: Self-pay | Admitting: Family Medicine

## 2018-07-31 VITALS — BP 176/99 | HR 110 | Temp 99.1°F | Ht 68.0 in | Wt 225.0 lb

## 2018-07-31 DIAGNOSIS — F1599 Other stimulant use, unspecified with unspecified stimulant-induced disorder: Secondary | ICD-10-CM

## 2018-07-31 DIAGNOSIS — Z91199 Patient's noncompliance with other medical treatment and regimen due to unspecified reason: Secondary | ICD-10-CM

## 2018-07-31 DIAGNOSIS — Z9119 Patient's noncompliance with other medical treatment and regimen: Secondary | ICD-10-CM

## 2018-07-31 DIAGNOSIS — G4700 Insomnia, unspecified: Secondary | ICD-10-CM

## 2018-07-31 DIAGNOSIS — F17210 Nicotine dependence, cigarettes, uncomplicated: Secondary | ICD-10-CM

## 2018-07-31 DIAGNOSIS — F339 Major depressive disorder, recurrent, unspecified: Secondary | ICD-10-CM | POA: Diagnosis not present

## 2018-07-31 DIAGNOSIS — F39 Unspecified mood [affective] disorder: Secondary | ICD-10-CM

## 2018-07-31 DIAGNOSIS — E781 Pure hyperglyceridemia: Secondary | ICD-10-CM

## 2018-07-31 DIAGNOSIS — R748 Abnormal levels of other serum enzymes: Secondary | ICD-10-CM

## 2018-07-31 DIAGNOSIS — M5416 Radiculopathy, lumbar region: Secondary | ICD-10-CM

## 2018-07-31 DIAGNOSIS — I10 Essential (primary) hypertension: Secondary | ICD-10-CM | POA: Diagnosis not present

## 2018-07-31 DIAGNOSIS — Z791 Long term (current) use of non-steroidal anti-inflammatories (NSAID): Secondary | ICD-10-CM

## 2018-07-31 DIAGNOSIS — G8929 Other chronic pain: Secondary | ICD-10-CM

## 2018-07-31 DIAGNOSIS — E7800 Pure hypercholesterolemia, unspecified: Secondary | ICD-10-CM

## 2018-07-31 DIAGNOSIS — Z79899 Other long term (current) drug therapy: Secondary | ICD-10-CM

## 2018-07-31 MED ORDER — FLUOXETINE HCL 20 MG PO CAPS: 20.0000 mg | ORAL_CAPSULE | Freq: Every day | ORAL | 0 refills | Status: DC

## 2018-07-31 NOTE — Telephone Encounter (Signed)
It is extrememly important that pt's demratologist---> WHICH I Bethany a copy of the most recent labs of this pt so they can be aware of his current liver fx tests since they have him on a med that can drastically effect it.   Please call to confirm their receipt and also write on the labs that pt was told by [place] / his pcp to stop lipitor and lovaza due to such a hike in LFT's   Ask then to please call us with further questions they may have

## 2018-07-31 NOTE — Telephone Encounter (Signed)
Per your verbal request to send copy of labs to GI, I contacted pt to obtain the physician's name.  Pt states that he DOES NOT have a GI specialist.  Charyl Bigger, CMA

## 2018-07-31 NOTE — Telephone Encounter (Signed)
Added Dr. Linus Salmons to team list. And labs faxed to them. MPulliam, CMA/RT(R)

## 2018-07-31 NOTE — Progress Notes (Signed)
Virtual Visit via Telephone Note for [employer], D.O- Primary Care Physician at [facility]   I connected with current patient today by telephone and verified that I am speaking with the correct person using two identifiers.   Because of federal recommendations of social distancing due to the current novel COVID-19 outbreak, an audio/video telehealth visit is felt to be most appropriate for this patient at this time.  My staff members also discussed with the patient that there may be a patient charge related to this service.   The patient expressed understanding, and agreed to proceed.     History of Present Illness:   Patient office visit today was extensive.  He is very upset as he says his neurosurgeon- Who is Dr. Ashok Pall, is not just doing back surgery but now he wants to do upper extremity carpal tunnel release.   He had done another release on his other wrist prior.  Patient is upset as his back is killing him and he does not feel he is getting any better and not getting the proper treatment.  He is upset his neurosurgeon keeps on focusing on other areas of his body other than his back.   Also states that his neurosurgeon told him that he will need to have surgery on his upper extremity at the elbow for impingement of a nerve as well.    Complete medical noncompliance: Secondly patient is not taking any of his medicines as prescribed.  He is rarely taking his blood pressure medicines, rarely taking his cholesterol medicines and as such these are both very poorly controlled.      -Hyperlipidemia: LDL went up from 137 6 months ago to 146 2 days ago, he has not been taking his Lipitor or Lovaza-maybe takes 1 every week or less.      Hypertension: Patient is not checking his blood pressures at home and whenever he is at another doctor's office it is running 150s-160s over 90s-100s.    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    Patient has terribly bad psoriasis which affects his mood and makes him feel very bad about himself.  He is treated by [place] [facility] apparently a Lars Pinks who is a Journalist, newspaper.  They started him on Skyrizi-and per patient the last time they checked any liver enzymes on him was over 6 months ago.  Last office visit no I have for them was back in September which I went to the chart and found again myself.  This was handwritten and difficult to make sense of.  There were no labs attached.    Also of note patient is a latent TB patient who was recently treated by Dr. Linus Salmons with gastroenterology.  Last seen 01/29/2018.  He was prescribed INH plus rifampin with B6.  He only took the Shaft alone for 4 weeks at that time.  Dr. Gerri Spore then saw him back and started INH plus rifampin for 4 weeks but did not return for refills so he was lost to follow-up.  Then, Dr. Everlene Balls saw him back and decided to have a heart-to-heart with him and patient did agree to INH plus rifampin plus B6 for 8 weeks and completed this course.  This did cause elevation in liver enzymes during that time.  Apparently, Dr. Arelia Longest patient's gastroenterologist did clear him to have the skyRisi due to his  liver though    He is drinking a lot of alcohol to help him sleep at night    Wt Readings from Last 3 Encounters:  07/31/18 225 lb (102.1 kg)  03/31/18 224 lb (101.6 kg)  03/07/18 216 lb 3.2 oz (98.1 kg)    BP Readings from Last 3 Encounters:  07/31/18 (!) 176/99  03/31/18 (!) 156/100  03/07/18 105/85    Pulse Readings from Last 3 Encounters:  07/31/18 (!) 110  03/31/18 84  03/07/18 80    BMI Readings from Last 3 Encounters:  07/31/18 34.21 kg/m  03/31/18 34.06 kg/m  03/07/18 32.87 kg/m      -Vitals obtained; Medications, allergies reconciled;  personal medical, social, Sx etc. etc. histories were updated by Lanier Prude the medical assistant today and  are reflected in below chart  Patient Care Team    Relationship Specialty Notifications Start End  Mellody Dance, DO PCP - General Family Medicine  08/26/17   Arlyss Gandy, PA-C  Dermatology  07/31/18    Comment: Rocco Serene, MD Consulting Physician Neurosurgery  07/31/18   Thayer Headings, MD Consulting Physician Infectious Diseases  07/31/18      Patient Active Problem List   Diagnosis Date Noted  . Elevated LDL cholesterol level 09/12/2017    Priority: High  . Hypertriglyceridemia 09/12/2017    Priority: High  . Cigarette smoker- 3 ppd for 6 yrs, then 1ppd for 9 yrs- 27 pk yr hx 08/26/2017    Priority: High  . HTN 08/26/2017    Priority: High  . RSD (reflex sympathetic dystrophy)- right entire leg painful to non-noxious stimuli 08/26/2017    Priority: Medium  . NSAID long-term use-4 ibuprofen 3 times daily times at least 5 years 08/26/2017    Priority: Medium  . Mood disorder ([place])- secondary to chronic pain syndrome 08/26/2017    Priority: Medium  . Insomnia 08/26/2017    Priority: Medium  . Family history of diabetes mellitus in father 09/12/2017    Priority: Low  . Psoriasis-onset age 4 has needed Cosentyx in past has failed Enbrel and Humira 08/26/2017    Priority: Low  . HNP (herniated nucleus pulposus), lumbar 03/07/2018  . Transaminitis 01/29/2018  . Medication monitoring encounter 11/29/2017  . TB lung, latent 11/06/2017  . High risk medication use 09/12/2017  . Vitamin D deficiency 09/12/2017  . Low level of high density lipoprotein (HDL) 09/12/2017  . Chronic radicular low back pain- R sided 08/26/2017  . History of excision of lamina of cervical vertebra for decompression of sp- C4-6inal cord 08/26/2017  . 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 Drinks beer- 6-8 beers/d on ave ( 10 Yrs or so) 08/26/2017  . 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 Chronic fatigue 08/26/2017  . Environmental and seasonal allergies 08/26/2017  . ED (erectile dysfunction) 08/26/2017  . Allergic conjunctivitis of both eyes and rhinitis 08/26/2017  . Obesity, Class I, BMI 30-34.9 08/26/2017  . Plaque psoriasis 08/26/2017     Current Meds  Medication Sig  . esomeprazole (NEXIUM) 20 MG capsule Take 20 mg by mouth daily at 12 noon.  . gabapentin (NEURONTIN) 300 MG capsule Take 2 capsules by mouth 2 (two) times daily.  Marland Kitchen isoniazid (NYDRAZID) 300 MG tablet Take 3 tablets (900 mg total) by mouth once a week. (Patient taking differently: Take 900 mg by mouth every Wednesday. )  . tiZANidine (ZANAFLEX) 4 MG tablet Take  1 tablet (4 mg total) by mouth every 6 (six) hours as needed for muscle spasms.  . [DISCONTINUED] ibuprofen (ADVIL,MOTRIN) 200 MG tablet Take 800 mg by mouth 3 (three) times daily as needed for moderate pain.      Allergies:  No Known Allergies   ROS:  See above HPI for pertinent positives and negatives   Objective:   Blood pressure (!) 176/99, pulse (!) 110, temperature 99.1 F (37.3 C), height 5\' 8"  (1.727 m), weight 225 lb (102.1 kg). General: sounds in no acute distress.  Skin: Pt confirms warm and dry  extremities and pink fingertips Respiratory: speaking in full sentences, no conversational dyspnea Psych: A and O *3, appears insight good, mood- full      Impression and Recommendations:    MOOD d/o/ depression- recurrent:  - Counseled patient on pathophysiology of disease and discussed various treatment options, which often includes dietary and lifestyle modifications as first line, in addition to discussing the risks and benefits of various medications.   - Importance of healthy eating, getting adequate sleep, daily exercise and seeking the help of a professional counselor discussed.  Pt encouraged to call one for an appt for counseling.   - Meditation and relaxation techniques discussed with patient.   Deep breathing/  Square breathing exercises reviewed.   - Encouraged daily meditation and regular exercise  - pt will start on prozac for depression/ MOOD d/o  - Anticipatory guidance given and pt was encouraged to return to clinic or call the office with any further questions or concerns.  Non-compliance:  Risks associated with noncompliant behavior with my treatment regimen or treatment plan reviewed with patient.   Pt denies significant barriers of education, money etc, did admit to depressed mood.  Explained many significant risks associated with these disease processes that if they remain poorly controlled, can even lead to death.  Advised pt that if he is going to continue to NOT follow my advice/ direction, it is best he stop coming to our clinic as to not waste my time or their money/time.    Caffeine abuse--> contributing to many conditions -->  tachycardia, elevated bld press, inc anxiety, insomnia  - ween down off all EXTENSIVE Caffeine/ energy drinks- SLOWLY; d/c pt w/d sx etc.   HTN:  ---> Restart BP meds; must check at homne  Vit D Def:   restart vit d   HLD:  DO not restart your lipitor or lovaza FOR NOW DUE TO ELEVATED LIVER ENZYMES- this is likely due to the meds given to you by your Dermatologist for your skin condition.  Compounding things is the recent txmnt you received from the specialist re: latent Tb.-->  He initially was prescribed INH + rifapentin with B6   High risk med use- mgt by [facility] --> I asked my CMA to please send [facility] all your most recent labs results and also get their notes for our review.   As part of my medical decision making, I reviewed the following data within the [facility] History obtained from pt/family, CMA notes reviewed and incorporated, Labs reviewed, Radiograph/ tests reviewed if applicable and OV notes from prior OV's with me, as well as other specialists he has seen since seeing me last, were all reviewed and used in my medical  decision making process today. Additionally, discussion had with patient regarding txmnt plan, their biases about that plan etc were used in my medical decision making today.  I discussed the assessment and treatment plan  with the patient. The patient was provided an opportunity to ask questions and all were answered.   - The patient agreed with the plan and demonstrated an understanding of the instructions.   No barriers to understanding were identified.  Red flag symptoms and signs discussed in detail.  Patient expressed understanding regarding what to do in case of emergency\urgent symptoms   The patient was advised to call back or seek an in-person evaluation if the symptoms worsen or if the condition fails to improve as anticipated.   Return for 6wks- f/up to see how BP is running, as well as started prozac, ween off caffeine.     Meds ordered this encounter  Medications  . FLUoxetine (PROZAC) 20 MG capsule    Sig: Take 1 capsule (20 mg total) by mouth daily.    Dispense:  90 capsule    Refill:  0     Gross side effects, risk and benefits, and alternatives of medications and treatment plan in general discussed with patient.  Patient is aware that all medications have potential side effects and we are unable to predict every side effect or drug-drug interaction that may occur.   Patient was strongly encouraged to call with any questions or concerns they may have concerns.     I provided 38+ minutes of non-face-to-face time during this encounter.   Mellody Dance, DO

## 2018-07-31 NOTE — Telephone Encounter (Signed)
Her honey- see my note- I wrote his name in there- maybe I misunderstood about the specific specialty sorry, but it was [facility] or something with a C- last seen pt for INH, rifampin and TB txmnt.Marland Kitchenswelling of the ankles might have been ID actually-  Sorry for confusion and thnx for f/up!!!

## 2018-08-02 ENCOUNTER — Other Ambulatory Visit: Payer: Self-pay | Admitting: Family Medicine

## 2018-08-05 LAB — SPECIMEN STATUS REPORT

## 2018-08-05 LAB — LDL CHOLESTEROL, DIRECT: LDL DIRECT: 146 mg/dL — AB (ref 0–99)

## 2018-09-09 ENCOUNTER — Other Ambulatory Visit: Payer: Self-pay

## 2018-09-09 ENCOUNTER — Encounter: Payer: Self-pay | Admitting: Family Medicine

## 2018-09-09 ENCOUNTER — Ambulatory Visit (INDEPENDENT_AMBULATORY_CARE_PROVIDER_SITE_OTHER): Payer: Medicare Other | Admitting: Family Medicine

## 2018-09-09 VITALS — BP 131/89 | Temp 98.9°F | Ht 68.0 in | Wt 230.0 lb

## 2018-09-09 DIAGNOSIS — F39 Unspecified mood [affective] disorder: Secondary | ICD-10-CM | POA: Diagnosis not present

## 2018-09-09 DIAGNOSIS — I10 Essential (primary) hypertension: Secondary | ICD-10-CM

## 2018-09-09 DIAGNOSIS — E786 Lipoprotein deficiency: Secondary | ICD-10-CM | POA: Diagnosis not present

## 2018-09-09 DIAGNOSIS — F1010 Alcohol abuse, uncomplicated: Secondary | ICD-10-CM

## 2018-09-09 DIAGNOSIS — R740 Nonspecific elevation of levels of transaminase and lactic acid dehydrogenase [LDH]: Secondary | ICD-10-CM

## 2018-09-09 DIAGNOSIS — E559 Vitamin D deficiency, unspecified: Secondary | ICD-10-CM

## 2018-09-09 DIAGNOSIS — E781 Pure hyperglyceridemia: Secondary | ICD-10-CM

## 2018-09-09 DIAGNOSIS — E7800 Pure hypercholesterolemia, unspecified: Secondary | ICD-10-CM

## 2018-09-09 DIAGNOSIS — M5126 Other intervertebral disc displacement, lumbar region: Secondary | ICD-10-CM

## 2018-09-09 DIAGNOSIS — R7401 Elevation of levels of liver transaminase levels: Secondary | ICD-10-CM

## 2018-09-09 DIAGNOSIS — G4700 Insomnia, unspecified: Secondary | ICD-10-CM

## 2018-09-09 MED ORDER — OMEGA-3-ACID ETHYL ESTERS 1 G PO CAPS: 2.0000 g | ORAL_CAPSULE | Freq: Two times a day (BID) | ORAL | 3 refills | Status: DC

## 2018-09-09 NOTE — Progress Notes (Signed)
Telehealth office visit note for Louis Carney, D.O- at [facility]   I connected with current patient today and verified that I am speaking with the correct person using two identifiers.    Location of the patient: Home  Location of the provider: Office Only the patient (+/- their family members at pt's discretion) and myself were participating in the encounter    - This visit type was conducted due to national recommendations for restrictions regarding the COVID-19 Pandemic (e.g. social distancing) in an effort to limit this patient's exposure and mitigate transmission in our community.  This format is felt to be most appropriate for this patient at this time.   - The patient did not have access to video technology or had technical difficulties with video requiring transitioning to audio format only. - No physical exam could be performed with this format, beyond that communicated to [place] by the patient/ family members as noted.   - Additionally my office staff/ schedulers discussed with the patient that there may be a monetary charge related to this service, depending on their medical insurance.   The patient expressed understanding, and agreed to proceed.       History of Present Illness:  Last seen on 07/31/18 for a VERY long OV: hpi and A/P below in blue  History of Present Illness:   Patient office visit today was extensive.  He is very upset as he says his neurosurgeon- Who is Dr. Ashok Pall, is not just doing back surgery but now he wants to do upper extremity carpal tunnel release.   He had done another release on his other wrist prior.  Patient is upset as his back is killing him and he does not feel he is getting any better and not getting the proper treatment.  He is upset his neurosurgeon keeps on focusing on other areas of his body other than his back.   Also states that his neurosurgeon told him that he will need to have surgery on his upper extremity  at the elbow for impingement of a nerve as well.    Complete medical noncompliance: Secondly patient is not taking any of his medicines as prescribed.  He is rarely taking his blood pressure medicines, rarely taking his cholesterol medicines and as such these are both very poorly controlled.      -Hyperlipidemia: LDL went up from 137 6 months ago to 146 2 days ago, he has not been taking his Lipitor or Lovaza-maybe takes 1 every week or less.      Hypertension: Patient is not checking his blood pressures at home and whenever he is at another doctor's office it is running 150s-160s over 90s-100s.    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    Patient has terribly bad psoriasis which affects his mood and makes him feel very bad about himself.  He is treated by [place] [facility] apparently a Lars Pinks who is a Journalist, [employer].  They started him on Skyrizi-and per patient the last time they checked any liver enzymes on him was over 6 months ago.  Last office visit no I have for them was back in September which I went to the chart and found again myself.  This was handwritten and difficult to make sense of.  There were no labs attached.    Also of note patient is a  latent TB patient who was recently treated by Dr. Linus Salmons with gastroenterology.  Last seen 01/29/2018.  He was prescribed INH plus rifampin with B6.  He only took the [place] alone for 4 weeks at that time.  Dr. Gerri Spore then saw him back and started INH plus rifampin for 4 weeks but did not return for refills so he was lost to follow-up.  Then, Dr. Everlene Balls saw him back and decided to have a heart-to-heart with him and patient did agree to INH plus rifampin plus B6 for 8 weeks and completed this course.  This did cause elevation in liver enzymes during that time.  Apparently, Dr. Arelia Longest patient's gastroenterologist did clear him to have the  skyRisi due to his liver though    He is drinking a lot of alcohol to help him sleep at night  A/P:  MOOD d/o/ depression- recurrent:  - Counseled patient on pathophysiology of disease and discussed various treatment options, which often includes dietary and lifestyle modifications as first line, in addition to discussing the risks and benefits of various medications.   - Importance of healthy eating, getting adequate sleep, daily exercise and seeking the help of a professional counselor discussed.  Pt encouraged to call one for an appt for counseling.   - Meditation and relaxation techniques discussed with patient.   Deep breathing/ Square breathing exercises reviewed.  - Encouraged daily meditation and regular exercise  - pt will start on prozac for depression/ MOOD d/o  - Anticipatory guidance given and pt was encouraged to return to clinic or call the office with any further questions or concerns.  Non-compliance:  Risks associated with noncompliant behavior with my treatment regimen or treatment plan reviewed with patient.   Pt denies significant barriers of education, money etc, did admit to depressed mood.  Explained many significant risks associated with these disease processes that if they remain poorly controlled, can even lead to death.  Advised pt that if he is going to continue to NOT follow my advice/ direction, it is best he stop coming to our clinic as to not waste my time or their money/time.    Caffeine abuse--> contributing to many conditions -->  tachycardia, elevated bld press, inc anxiety, insomnia  - ween down off all EXTENSIVE Caffeine/ energy drinks- SLOWLY; d/c pt w/d sx etc.   HTN:  ---> Restart BP meds; must check at homne  Vit D Def:   restart vit d   HLD:  DO not restart your lipitor or lovaza FOR NOW DUE TO ELEVATED LIVER ENZYMES- this is likely due to the meds given to you by your Dermatologist for your skin condition.   Compounding things is the recent txmnt you received from the specialist re: latent Tb.-->  He initially was prescribed INH + rifapentin with B6   High risk med use- mgt by DER[facility] --> I asked my CMA to please send [facility] all your most recent labs results and also get their notes for our review.  Return for 6wks- f/up to see how BP is running, as well as started prozac, ween off caffeine.    Today:  - Was told Return for 6wks- f/up to see how BP is running, as well as started prozac, ween off caffeine.   HTN:    - Checks it once or twice a week.   133/85 , 131/89- and anywhere in btwn.   When he has back pain in the morning he tries not to check his blood pressure as, this  is when his blood pressure is very high when his pain is the worst.  NO Sx at all.  - 2 wks ago- quit all the energy drinks!    Pt doesn't feel any different - so not sure why he took it.   - drinking a lot of POM juice, gatorade and water.   - sleeping a little better - Still drinking 6-8 beers and 2-3 shots per night- bud light and vodka.   Mood:  - started prozac last OV- made him jittery/ shakey and stomach was tuned up- -did not know if he had a do #1 or #2 and felt his stomach was always gurgling.  - emotionally doing much better.   With wife and kids at home now-  - [place] and he are working on conflicts now- better communication now - boys are home, helping with home chores.  He is no longer lonely during the day.  Ortho: -Initially he was scheduled to have surgery tomorrow for the pinched nerve at his elbow but they have rescheduled that.  Patient is afraid or leery about going into the hospital for a nonemergent surgery.   --> pt looking at establishing with Dr Jesse Fall for his chronic back issues.  He has called several   -Vitamin D deficiency:  Patient states that vitamin D was not covered by his insurance and hence it was too expensive.  He is taking 3000 IUs of vitamin D3  over-the-counter.  Hypertriglyceridemia:  Also patient has significantly elevated triglycerides over thousand as well as very poor HDL.  He drinks a ton every night.  We again discussed this today and patient wishes to take over-the-counter fish oil.  He says he felt better when taking the fish oil.  Hence, told him he can go get his Lovaza refilled and start taking that again.  He understands it can negatively affect his liver enzymes and we will need to monitor this closely.  -Heavy alcohol consumption -Patient still drinks over 6 drinks per night or more.  He understands how horrible this is for his liver and body.  I explained this is a depressant, slows the mind, alters the mood, interrupts sleep etc. etc.  Told him if he drinks this much is no way I can help him feel well.  Impression and Recommendations:    1. HTN   2. Mood disorder ([place])- secondary to chronic pain syndrome   3. Hypertriglyceridemia   4. Low level of high density lipoprotein (HDL)   5. Elevated LDL cholesterol level   6. Alcohol abuse   7. Transaminitis   8. Insomnia, unspecified type   9. Vitamin D deficiency   10. HNP (herniated nucleus pulposus), lumbar      -Significant hypertriglyceridemia over thousand:  Patient desires to restart fish oil since this made him feel well.  I advised if he is going to take fish oil he might as well take the prescription Lovaza since that was covered by his insurance plan.  We will recheck CMP next office visit in 3-4 months (since patient restarted his Lovaza),   - vitamin D deficiency:  He will start over-the-counter vitamin D3.  Advised 5000 IUs daily.  - alcohol abuse:    I explained alcohol is a depressant, slows the mind, alters the mood, interrupts sleep etc. etc.  Told him if he drinks this much is no way I can help him feel well.  He absolutely needs to cut back to no more than 2/day.  We  should recheck his CMP when he is next in the office in 3 to 4  months.  -Hypertension:  Continue to monitor blood pressure at home.  I would like him to check more often than once per week.  I advised him to not check during times when he is in bad pain since this will falsely elevate it.  Continue to write down his numbers.  -Mood: Patient states his mood is much better and relates that it is due to the loneliness when he is home alone and feels disjointed from his family.  Patient declines restarting another medicine since the Prozac bothered his stomach.  Explained to patient he will need to find other ways to manage his stress as once life post COVID gets back to normal, he will have the same stressors.   -Highly, highly encouraged to get a counselor once again.  Chronic back pain: -Patient will continue to try to contact Dr. Donnella Bi office so he can establish with a neurosurgeon who specializes in the back.  He will await any surgical activities until COVID is over  - As part of my medical decision making, I reviewed the following data within the [place] History obtained from pt /family, CMA notes reviewed and incorporated if applicable, Labs reviewed, Radiograph/ tests reviewed if applicable and OV notes from prior OV's with me, as well as other specialists she/he has seen since seeing me last, were all reviewed and used in my medical decision making process today.   - Additionally, discussion had with patient regarding txmnt plan, and their biases/concerns about that plan were used in my medical decision making today.   - The patient agreed with the plan and demonstrated an understanding of the instructions.   No barriers to understanding were identified.   - Red flag symptoms and signs discussed in detail.  Patient expressed understanding regarding what to do in case of emergency\ urgent symptoms.  The patient was advised to call back or seek an in-person evaluation if the symptoms worsen or if the condition fails to improve as  anticipated.   Return for 3-4 mo HTN, ETOH Abuse, Mood, Chol/TG, started lovaza back.    No orders of the defined types were placed in this encounter.   Meds ordered this encounter  Medications   omega-3 acid ethyl esters (LOVAZA) 1 g capsule    Sig: Take 2 capsules (2 g total) by mouth 2 (two) times daily.    Dispense:  180 capsule    Refill:  3    Medications Discontinued During This Encounter  Medication Reason   isoniazid (NYDRAZID) 300 MG tablet Completed Course      I provided 22 minutes of non-face-to-face time during this encounter,with over 50% of the time in direct counseling on patients medical conditions/ medical concerns.  Additional time was spent with charting and coordination of care after the actual visit commenced.   Note:  This note was prepared with assistance of Dragon voice recognition software. Occasional wrong-word or sound-a-like substitutions may have occurred due to the inherent limitations of voice recognition software.  Louis Dance, DO     Patient Care Team    Relationship Specialty Notifications Start End  Louis Dance, DO PCP - General Family Medicine  08/26/17   Arlyss Gandy, PA-C  Dermatology  07/31/18    Comment: Rocco Serene, MD Consulting Physician Neurosurgery  07/31/18   Thayer Headings, MD Consulting Physician Infectious Diseases  07/31/18      -  Vitals obtained; medications/ allergies reconciled;  personal medical, social, Sx etc.histories were updated by CMA, reviewed by me and are reflected in chart   Patient Active Problem List   Diagnosis Date Noted   Elevated LDL cholesterol level 09/12/2017    Priority: High   Hypertriglyceridemia 09/12/2017    Priority: High   Cigarette smoker- 3 ppd for 6 yrs, then 1ppd for 9 yrs- 27 pk yr hx 08/26/2017    Priority: High   HTN 08/26/2017    Priority: High   RSD (reflex sympathetic dystrophy)- right entire leg painful to non-noxious stimuli 08/26/2017     Priority: Medium   NSAID long-term use-4 ibuprofen 3 times daily times at least 5 years 08/26/2017    Priority: Medium   Mood disorder ([place])- secondary to chronic pain syndrome 08/26/2017    Priority: Medium   Insomnia 08/26/2017    Priority: Medium   Family history of diabetes mellitus in father 09/12/2017    Priority: Low   Psoriasis-onset age 39 has needed Cosentyx in past has failed Enbrel and Humira 08/26/2017    Priority: Low   Alcohol abuse 09/09/2018   HNP (herniated nucleus pulposus), lumbar 03/07/2018   Transaminitis 01/29/2018   Medication monitoring encounter 11/29/2017   TB lung, latent 11/06/2017   High risk medication use 09/12/2017   Vitamin D deficiency 09/12/2017   Low level of high density lipoprotein (HDL) 09/12/2017   Chronic radicular low back pain- R sided 08/26/2017   History of excision of lamina of cervical vertebra for decompression of sp- C4-6inal cord 08/26/2017   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   fexofenadine (ALLEGRA) 180 MG tablet Take 1 tablet (180 mg total) by mouth daily. (Patient taking differently: Take 180 mg by mouth daily as needed for allergies. )   gabapentin (NEURONTIN) 300 MG capsule Take 2 capsules by mouth 2 (two) times daily.   Olmesartan-amLODIPine-HCTZ 40-10-25 MG TABS 1QD (Patient taking differently: Take 1 tablet by mouth daily. )    olopatadine (PATANOL) 0.1 % ophthalmic solution Place 1 drop into both eyes 2 (two) times daily.   sildenafil (VIAGRA) 100 MG tablet TAKE ONE-HALF TO ONE TABLET BY MOUTH 30 MINUTES PRIOR TO INTERCOURSE AS NEEDED   tiZANidine (ZANAFLEX) 4 MG tablet Take 1 tablet (4 mg total) by mouth every 6 (six) hours as needed for muscle spasms.     Allergies:  No Known Allergies   ROS:  See above HPI for pertinent positives and negatives   Objective:   Blood pressure 131/89, temperature 98.9 F (37.2 C), height 5\' 8"  (1.727 m), weight 230 lb (104.3 kg).  (if some vitals are omitted, this means that patient was UNABLE to obtain them even though they were asked to get them prior to OV today.  They were asked to call us at their earliest convenience with these once obtained. )  General: A & O * 3; sounds in no acute distress; in  usual state of health.  Skin: Pt confirms warm and dry extremities and pink fingertips HEENT: Pt confirms lips non-cyanotic Chest: Patient confirms normal chest excursion and movement Respiratory: speaking in full sentences, no conversational dyspnea; patient confirms no use of accessory muscles Psych: insight appears good, mood- appears full

## 2018-10-25 ENCOUNTER — Emergency Department
Admission: EM | Admit: 2018-10-25 | Discharge: 2018-10-25 | Disposition: A | Discharge status: Home / Self Care | Payer: Medicare Other | Attending: Emergency Medicine | Admitting: Emergency Medicine

## 2018-10-25 DIAGNOSIS — Z5321 Procedure and treatment not carried out due to patient leaving prior to being seen by health care provider: Secondary | ICD-10-CM | POA: Insufficient documentation

## 2018-10-25 DIAGNOSIS — R000 Tachycardia, unspecified: Secondary | ICD-10-CM | POA: Diagnosis not present

## 2018-10-25 DIAGNOSIS — E1165 Type 2 diabetes mellitus with hyperglycemia: Secondary | ICD-10-CM | POA: Diagnosis not present

## 2018-10-25 DIAGNOSIS — R531 Weakness: Secondary | ICD-10-CM | POA: Diagnosis not present

## 2018-10-25 DIAGNOSIS — R55 Syncope and collapse: Secondary | ICD-10-CM | POA: Diagnosis not present

## 2018-10-25 DIAGNOSIS — R42 Dizziness and giddiness: Secondary | ICD-10-CM | POA: Diagnosis not present

## 2018-10-25 NOTE — ED Notes (Signed)
Patient to waiting room via wheelchair by EMS.  EMS reports dizziness, increased thirst and urination.  No known history of diabetes.  EMS meter was reading high for glucose. IV via 20 g angio cath to left antecub given 500 ml bolus.  BP - 130/90, pulse oxi - 94% on room air, HR - 106

## 2018-10-25 NOTE — ED Notes (Signed)
Brought pt into triage one to start the triage process; pt says he just moved here from [facility] and he's never waited any longer than 10 or 15 minutes at the ED; says he's not waiting any longer to see the doctor; explained to pt his blood sugar is really high; says he has an appt Monday with his new MD to be seen for they symptoms he's been having; no history of diabetes; says he's "undiagnosed" so he'll be good; has been having these symptoms for weeks "so what's a few more days"; explained the risks to pt, including the possibility of death, to which he replied "well if it's my time, it's my time"; pulled pt's IV and he ambulated out of the triage area without difficulty; says he's already called for a ride home

## 2018-10-27 ENCOUNTER — Ambulatory Visit (INDEPENDENT_AMBULATORY_CARE_PROVIDER_SITE_OTHER): Payer: Medicare Other | Admitting: Family Medicine

## 2018-10-27 ENCOUNTER — Other Ambulatory Visit: Payer: Self-pay

## 2018-10-27 ENCOUNTER — Encounter: Payer: Self-pay | Admitting: Family Medicine

## 2018-10-27 VITALS — BP 136/92 | HR 54 | Temp 97.9°F | Ht 68.0 in | Wt 222.8 lb

## 2018-10-27 DIAGNOSIS — E1049 Type 1 diabetes mellitus with other diabetic neurological complication: Secondary | ICD-10-CM

## 2018-10-27 DIAGNOSIS — R42 Dizziness and giddiness: Secondary | ICD-10-CM

## 2018-10-27 DIAGNOSIS — IMO0002 Reserved for concepts with insufficient information to code with codable children: Secondary | ICD-10-CM

## 2018-10-27 DIAGNOSIS — R110 Nausea: Secondary | ICD-10-CM | POA: Diagnosis not present

## 2018-10-27 DIAGNOSIS — E1065 Type 1 diabetes mellitus with hyperglycemia: Secondary | ICD-10-CM

## 2018-10-27 DIAGNOSIS — E1100 Type 2 diabetes mellitus with hyperosmolarity without nonketotic hyperglycemic-hyperosmolar coma (NKHHC): Secondary | ICD-10-CM | POA: Diagnosis not present

## 2018-10-27 LAB — POCT GLYCOSYLATED HEMOGLOBIN (HGB A1C): Hemoglobin A1C: 9.9 % — AB (ref 4.0–5.6)

## 2018-10-27 MED ORDER — LANTUS SOLOSTAR 100 UNIT/ML ~~LOC~~ SOPN: PEN_INJECTOR | SUBCUTANEOUS | 99 refills | Status: DC

## 2018-10-27 MED ORDER — BLOOD GLUCOSE METER KIT: PACK | 0 refills | Status: DC

## 2018-10-27 NOTE — Progress Notes (Signed)
° ° ° ° °Impression and Recommendations:   ° °1. New onset type 1 diabetes mellitus, uncontrolled (HCC)   °2. Type 1 diabetes mellitus with hyperglycemia (HCC)   °3. Dizziness   °4. Nausea   °5. Other diabetic neurological complication associated with type 1 diabetes mellitus (HCC)   ° ° °-Patient new onset diabetic- high risk- very recent onset; symptomatic with A1c over 9.    A1c almost doubled in a matter 6 months. °- Explained pathophysiology disease to patient that symptoms of nausea, abdominal pain, dizziness etc. are due to hyperglycemia. °-Needs endocrinology consult ASAP so he can have the support needed for new onset diabetic with A1c 9.9 °-Patient will cut back on sugary foods, drinks etc.; extensive counseling with pt done today   °- Diabetic education referral placed °-Explained visual changes are due to hypoglycemia.  -  Ophthalmology referral placed for diabetic eye exam °-Also explained that neuropathic symptoms he is having is likely due to hyperglycemia as well.  This is on top of his already pre-existing neuropathy due to his back. °-Told patient also to talk to his human otologist regarding his Skyrizi to see if this has somehow hasten the onset of diabetes °-Red flag symptoms discussed and told patient that hyperglycemia can cause him to feel sick and nauseous and if he develops this he is to dial 911 go to the emergency room for possible treatment DKA.  ° ° °New onset type 1 diabetes mellitus, uncontrolled (HCC) - Plan: Comprehensive metabolic panel, CBC with Differential/Platelet, Hemoglobin A1c, Insulin, random, Magnesium, Phosphorus, T4, free, TSH, Microalbumin / creatinine urine ratio, Insulin Glargine (LANTUS SOLOSTAR) 100 UNIT/ML Solostar Pen, Lipid Panel w/reflex Direct LDL, Ambulatory referral to diabetic education, Ambulatory referral to Ophthalmology, Ambulatory referral to Endocrinology, blood glucose meter kit and supplies ° °Type 1 diabetes mellitus with hyperglycemia (HCC) -  Plan: Comprehensive metabolic panel, CBC with Differential/Platelet, Hemoglobin A1c, Insulin, random, Magnesium, Phosphorus, T4, free, TSH, Microalbumin / creatinine urine ratio, Insulin Glargine (LANTUS SOLOSTAR) 100 UNIT/ML Solostar Pen, Lipid Panel w/reflex Direct LDL, Ambulatory referral to diabetic education, Ambulatory referral to Ophthalmology, Ambulatory referral to Endocrinology, blood glucose meter kit and supplies ° °Dizziness - Plan: Comprehensive metabolic panel, CBC with Differential/Platelet, Hemoglobin A1c, Insulin, random, Magnesium, Phosphorus, T4, free, TSH, Microalbumin / creatinine urine ratio, Lipid Panel w/reflex Direct LDL, Ambulatory referral to diabetic education, Ambulatory referral to Endocrinology ° °Nausea - Plan: Comprehensive metabolic panel, CBC with Differential/Platelet, Hemoglobin A1c, Insulin, random, Magnesium, Phosphorus, T4, free, TSH, Microalbumin / creatinine urine ratio, Lipid Panel w/reflex Direct LDL, Ambulatory referral to diabetic education, Ambulatory referral to Endocrinology ° °Other diabetic neurological complication associated with type 1 diabetes mellitus (HCC) - Plan: Comprehensive metabolic panel, CBC with Differential/Platelet, Hemoglobin A1c, Insulin, random, Magnesium, Phosphorus, T4, free, TSH, Microalbumin / creatinine urine ratio, Insulin Glargine (LANTUS SOLOSTAR) 100 UNIT/ML Solostar Pen, Lipid Panel w/reflex Direct LDL, Ambulatory referral to diabetic education, Ambulatory referral to Endocrinology ° ° °Meds ordered this encounter  °Medications  °• Insulin Glargine (LANTUS SOLOSTAR) 100 UNIT/ML Solostar Pen  °  Sig: Inject 15 units into the skin twice daily-12 hours apart.  °  Dispense:  5 pen  °  Refill:  PRN  °• blood glucose meter kit and supplies  °  Sig: Dispense based on patient and insurance preference. Use to check glucose level three times daily before meals and 3 times daily 2 hours after meals (6 times daily total). (FOR ICD-10 E10.9, E11.9).   °  Dispense:  1   each  °  Refill:  0  °  Order Specific Question:   Number of strips  °  Answer:   200  °  Order Specific Question:   Number of lancets  °  Answer:   200  ° ° °Medications Discontinued During This Encounter  °Medication Reason  °• Vitamin D, Ergocalciferol, (DRISDOL) 1.25 MG (50000 UT) CAPS capsule Cost of medication  °• olopatadine (PATANOL) 0.1 % ophthalmic solution Completed Course  °• FLUoxetine (PROZAC) 20 MG capsule Patient Preference  °• fexofenadine (ALLEGRA) 180 MG tablet Patient Preference  °  ° °Orders Placed This Encounter  °Procedures  °• Comprehensive metabolic panel  °• CBC with Differential/Platelet  °• Hemoglobin A1c  °• Insulin, random  °• Magnesium  °• Phosphorus  °• T4, free  °• TSH  °• Microalbumin / creatinine urine ratio  °• Lipid Panel w/reflex Direct LDL  °• Ambulatory referral to diabetic education  °• Ambulatory referral to Ophthalmology  °• Ambulatory referral to Endocrinology  ° ° °· Education and routine counseling performed. Handouts provided.  Pt was interviewed and evaluated by me in the clinic today for 32.5+ minutes, with over 50% time spent in face to face counseling of patients various medical conditions, treatment plans of those medical conditions including medicine management and lifestyle modification, strategies to improve health and well being; and in coordination of care. SEE ABOVE TREATMENT PLAN FOR DETAILS ° °Gross side effects, risk and benefits, and alternatives of medications and treatment plan in general discussed with patient.  Patient is aware that all medications have potential side effects and we are unable to predict every side effect or drug-drug interaction that may occur.   Patient will call with any questions prior to using medication if they have concerns.   ° °Expresses verbal understanding and consents to current therapy and treatment regimen.  No barriers to understanding were identified.  Red flag symptoms and signs discussed in detail.   Patient expressed understanding regarding what to do in case of emergency\urgent symptoms ° ° °Please see AVS handed out to patient at the end of our visit for further patient instructions/ counseling done pertaining to today's office visit. ° ° °Return for 2) 1 week for new onset DM- started lantus (unless can be seen by Endo first). °   ° °Note:  This document was prepared occasionally using Dragon voice recognition software and may include unintentional dictation errors in addition to a scribe. ° ° , DO  °10/27/2018 °10:46 AM ° ° ° ° °-------------------------------------------------------------------------------------------------------------------------------------------------------------------------------------------------------------------------------------------- ° ° ° °Subjective:   ° °CC:  °Chief Complaint  °Patient presents with  °• Hyperglycemia  ° ° °HPI: Louis Carney is a 38 y.o. male who presents to [facility] today for issues as discussed below. ° °Pt has been having excessive thirst and urination 7-10 days.    patient had a family friend who checked his blood sugar at home which was this past Saturday.  He actually called EMS to the house because he did not feel well- felt "lightheaded and dizzy" and his body "just did not feel well".   Some visual changes/ blurred a little but no focal neurological findings.   When EMS came to the house they said his blood sugar was over 500 at the time. ° °Patient has had a great improvement in his blood pressures.   °- He has changed his eating habits and drinking habits- most severely in the past couple of day/ weeks.    °-   He also has felt a little dizzy and/or lightheaded almost queasy a little bit if he moves or gets up too quickly..   °- He is drinking more water and eating much less salt and has cut out all his energy drinks.  BP at home have been running in the 130s systolically over 80s diastolically.  This is a  huge improvement for patient. ° ° ° °Lab Results  °Component Value Date  ° HGBA1C 5.6 03/06/2018  ° HGBA1C 5.3 08/26/2017  ° ° ° °Wt Readings from Last 3 Encounters:  °10/27/18 222 lb 12.8 oz (101.1 kg)  °09/09/18 230 lb (104.3 kg)  °07/31/18 225 lb (102.1 kg)  ° °BP Readings from Last 3 Encounters:  °10/27/18 (!) 136/92  °09/09/18 131/89  °07/31/18 (!) 176/99  ° °BMI Readings from Last 3 Encounters:  °10/27/18 33.88 kg/m²  °09/09/18 34.97 kg/m²  °07/31/18 34.21 kg/m²  ° ° ° °Patient Care Team  °  Relationship Specialty Notifications Start End  °, , DO PCP - General Family Medicine  08/26/17   °Clark-Bruning, Jennifer, PA-C  Dermatology  07/31/18   ° Comment: skyrizi  °Cabbell, Kyle, MD Consulting Physician Neurosurgery  07/31/18   °Comer, Robert W, MD Consulting Physician Infectious Diseases  07/31/18   ° ° ° °Patient Active Problem List  ° Diagnosis Date Noted  °• Elevated LDL cholesterol level 09/12/2017  °  Priority: High  °• Hypertriglyceridemia 09/12/2017  °  Priority: High  °• Cigarette smoker- 3 ppd for 6 yrs, then 1ppd for 9 yrs- 27 pk yr hx 08/26/2017  °  Priority: High  °• HTN 08/26/2017  °  Priority: High  °• RSD (reflex sympathetic dystrophy)- right entire leg painful to non-noxious stimuli 08/26/2017  °  Priority: Medium  °• NSAID long-term use-4 ibuprofen 3 times daily times at least 5 years 08/26/2017  °  Priority: Medium  °• Mood disorder (HCC)- secondary to chronic pain syndrome 08/26/2017  °  Priority: Medium  °• Insomnia 08/26/2017  °  Priority: Medium  °• Family history of diabetes mellitus in father 09/12/2017  °  Priority: Low  °• Psoriasis-onset age 26 has needed Cosentyx in past has failed Enbrel and Humira 08/26/2017  °  Priority: Low  °• Alcohol abuse 09/09/2018  °• HNP (herniated nucleus pulposus), lumbar 03/07/2018  °• Transaminitis 01/29/2018  °• Medication monitoring encounter 11/29/2017  °• TB lung, latent 11/06/2017  °• High risk medication use 09/12/2017  °• Vitamin D  deficiency 09/12/2017  °• Low level of high density lipoprotein (HDL) 09/12/2017  °• Chronic radicular low back pain- R sided 08/26/2017  °• History of excision of lamina of cervical vertebra for decompression of sp- C4-6inal cord 08/26/2017  °• Neuropathy due to medical condition (HCC)-C8-T1 nerve distribution bilateral upper extremity, 08/26/2017  °• Drinks beer- 6-8 beers/d on ave ( 10 Yrs or so) 08/26/2017  °• 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 08/26/2017  °• Chronic fatigue 08/26/2017  °• Environmental and seasonal allergies 08/26/2017  °• ED (erectile dysfunction) 08/26/2017  °• Allergic conjunctivitis of both eyes and rhinitis 08/26/2017  °• Obesity, Class I, BMI 30-34.9 08/26/2017  °• Plaque psoriasis 08/26/2017  ° ° ° ° °Past Medical History:  °Diagnosis Date  °• Arthritis   °• GERD (gastroesophageal reflux disease)   °• Headache   ° migraines  °• Hypertension   °• Psoriasis   ° since age 26 years  °• Tuberculosis   ° latent TB infection on treatment  °  ° °  Past Surgical History:  °Procedure Laterality Date  °• CARPAL TUNNEL RELEASE Right 01/15/2018  ° Procedure: Right Carpal tunnel release;  Surgeon: Cabbell, Kyle, MD;  Location: [facility] OR;  Service: Neurosurgery;  Laterality: Right;  Right Carpal tunnel release  °• CERVICAL SPINE SURGERY    °• LUMBAR LAMINECTOMY/DECOMPRESSION MICRODISCECTOMY Right 03/07/2018  ° Procedure: Right Lumbar 5 Sacral 1 Microdiscectomy;  Surgeon: Cabbell, Kyle, MD;  Location: [facility] OR;  Service: Neurosurgery;  Laterality: Right;  Right Lumbar 5 Sacral 1 Microdiscectomy  °• NECK SURGERY    °  ° °Family History  °Problem Relation Age of Onset  °• Cancer Mother   °     LUNG  °• Alcohol abuse Mother   °• Hypertension Mother   °• Alcohol abuse Father   °• Hypertension Father   °  ° °Social History  ° °Socioeconomic History  °• Marital status: Married  °  Spouse name: Not on file  °• Number of children: Not on file  °• Years of education: Not on file  °• Highest education  level: Not on file  °Occupational History  °• Not on file  °Social Needs  °• Financial resource strain: Not on file  °• Food insecurity  °  Worry: Not on file  °  Inability: Not on file  °• Transportation needs  °  Medical: Not on file  °  Non-medical: Not on file  °Tobacco Use  °• Smoking status: Current Every Day Smoker  °  Packs/day: 1.00  °  Years: 15.00  °  Pack years: 15.00  °  Types: Cigarettes  °• Smokeless tobacco: Never Used  °Substance and Sexual Activity  °• Alcohol use: Yes  °  Alcohol/week: 24.0 standard drinks  °  Types: 24 Standard drinks or equivalent per week  °• Drug use: Never  °• Sexual activity: Yes  °  Birth control/protection: None  °Lifestyle  °• Physical activity  °  Days per week: Not on file  °  Minutes per session: Not on file  °• Stress: Not on file  °Relationships  °• Social connections  °  Talks on phone: Not on file  °  Gets together: Not on file  °  Attends religious service: Not on file  °  Active member of club or organization: Not on file  °  Attends meetings of clubs or organizations: Not on file  °  Relationship status: Not on file  °• Intimate partner violence  °  Fear of current or ex partner: Not on file  °  Emotionally abused: Not on file  °  Physically abused: Not on file  °  Forced sexual activity: Not on file  °Other Topics Concern  °• Not on file  °Social History Narrative  °• Not on file  °  ° °Current Meds  °Medication Sig  °• esomeprazole (NEXIUM) 20 MG capsule Take 20 mg by mouth daily at 12 noon.  °• gabapentin (NEURONTIN) 300 MG capsule Take 2 capsules by mouth 2 (two) times daily.  ° ° °Allergies:  °No Known Allergies ° ° °Review of Systems: °General:   Denies fever, chills, unexplained weight loss.  °Optho/Auditory:   Denies visual changes, blurred vision/LOV °Respiratory:   Denies wheeze, DOE more than baseline levels.   °Cardiovascular:   Denies chest pain, palpitations, new onset peripheral edema  °Gastrointestinal:   Denies nausea, vomiting, diarrhea, abd  pain.  °Genitourinary: Denies dysuria, freq/ urgency, flank pain or discharge from genitals.  °Endocrine:       Denies hot or cold intolerance, polyuria, polydipsia. °Musculoskeletal:   Denies unexplained myalgias, joint swelling, unexplained arthralgias, gait problems.  °Skin:  Denies new onset rash, suspicious lesions °Neurological:     Denies dizziness, unexplained weakness, numbness  °Psychiatric/Behavioral:   Denies mood changes, suicidal or homicidal ideations, hallucinations ° ° ° °Objective:   °Blood pressure (!) 136/92, pulse (!) 54, temperature 97.9 °F (36.6 °C), height 5' 8" (1.727 m), weight 222 lb 12.8 oz (101.1 kg), SpO2 98 %. °Body mass index is 33.88 kg/m². °General:  Well Developed, well nourished, appropriate for stated age.  °Neuro:  Alert and oriented,  extra-ocular muscles intact  °HEENT:  Normocephalic, atraumatic, neck supple °Skin:  no gross rash, warm, pink. °Cardiac:  RRR, S1 S2 °Respiratory:  ECTA B/L and A/P, Not using accessory muscles, speaking in full sentences- unlabored. °Vascular:  Ext warm, no cyanosis apprec.; cap RF less 2 sec. °Psych:  No HI/SI, judgement and insight good, Euthymic mood. Full Affect. ° ° °

## 2018-10-27 NOTE — Patient Instructions (Addendum)
Please go to the American diabetes Association at www.diabetes.org and read the information about new onset of type 1 diabetes.  Also there you will see when to check your blood sugars etc.    Insulin Treatment for Diabetes Mellitus Diabetes (diabetes mellitus) is a long-term (chronic) disease. It occurs when the body does not properly use sugar (glucose) that is released from food after digestion. Glucose levels are controlled by a hormone called insulin. Insulin is made in the pancreas, which is an organ behind the stomach.  If you have type 1 diabetes, you must take insulin because your pancreas does not make any.  If you have type 2 diabetes, you might need to take insulin along with other medicines. In type 2 diabetes, one or both of these problems may be present: ? The pancreas does not make enough insulin. ? Cells in the body do not respond properly to insulin that the body makes (insulin resistance). You must use insulin correctly to control your diabetes. You must have some insulin in your body at all times. Insulin treatment varies depending on your type of diabetes, your treatment goals, and your medical history. Ask questions to understand your insulin treatment plan so you can be an active partner in managing your diabetes. How is insulin given? Insulin can only be given through a shot (injection). It is injected using a syringe and needle, an insulin pen, a pump, or a jet injector. Your health care provider will:  Prescribe the type and amount of insulin that you need.  Tell you when you should inject your insulin. Where on the body should insulin be injected? Insulin is injected into a layer of fatty tissue under the skin. Good places to inject insulin include:  Abdomen. Generally, the abdomen is the best place to inject insulin. However, you should avoid any area that is less than 2 inches (5 cm) from the belly button (navel).  Front of thigh.  Upper, outer side of thigh.   Upper, outer side of arm.  Upper, outer part of buttock. It is important to:  Give your injection in a slightly different place each time. This helps to prevent irritation and improve absorption.  Avoid injecting into areas that have scar tissue. Usually, you will give yourself insulin injections. Others can also be taught how to give you injections. You will use a special type of syringe that is made only for insulin. Some people may have an insulin pump that delivers insulin steadily through a tube (cannula) that is placed under the skin. What are the different types of insulin? The following information is a general guide to different types of insulin. Specifics vary depending on the insulin product that your health care provider prescribes.  Rapid-acting insulin: ? Starts working quickly, in as little as 5 minutes. ? Can last for 4-6 hours (or sometimes longer). ? Works well when taken right before a meal to quickly lower your blood glucose.  Short-acting insulin: ? Starts working in about 30 minutes. ? Can last for 6-10 hours. ? Should be taken about 30 minutes before you start eating a meal.  Intermediate-acting insulin: ? Starts working in 1-2 hours. ? Lasts for about 10-18 hours. ? Lowers your blood glucose for a longer period of time, but it is not as effective for lowering blood glucose right after a meal.  Long-acting insulin: ? Mimics the small amount of insulin that your pancreas usually produces throughout the day. ? Should be used one or two times  a day. ? Is usually used in combination with other types of insulin or other medicines.  Concentrated insulin, or U-500 insulin: ? Contains a higher dose of insulin than most rapid-acting insulins. U-500 insulin has 5 times the amount of insulin per 1 mL. ? Should only be used with the special U-500 syringe or U-500 insulin pen. It is dangerous to use the wrong type of syringe with this insulin. What are the side effects  of insulin? Possible side effects of insulin treatment include:  Low blood glucose (hypoglycemia).  Weight gain.  High blood glucose (hyperglycemia).  Skin injury or irritation. Some of these side effects can be caused by using improper injection technique. Be sure to learn how to inject insulin properly. What are common terms associated with insulin treatment? Some terms that you might hear include:  Basal insulin, or basal rate. This is the constant amount of insulin that needs to be present in your body to keep your blood glucose levels stable. People who have type 1 diabetes need basal insulin in a steady (continuous) dose 24 hours a day. ? Usually, intermediate-acting or long-acting insulin is used one or two times a day to manage basal insulin levels. ? Medicines that are taken by mouth may also be recommended to manage basal insulin levels.  Prandial or nutrition insulin. This refers to meal-related insulin. ? Blood glucose rises quickly after a meal (postprandial). Rapid-acting or short-acting insulin can be used right before a meal (preprandial) to quickly lower your blood glucose. ? You may be instructed to adjust the amount of prandial insulin that you take, based on how much carbohydrate (starch) is in your meal.  Corrective insulin. This may also be called a correction dose or supplemental dose. This is a small amount of rapid-acting or short-acting insulin that can be used to lower your blood glucose if it is too high. You may be instructed to check your blood glucose at certain times of the day and use corrective insulin as needed.  Tight control, or intensive therapy. This means keeping your blood glucose as close to your target as possible, and preventing your blood glucose from getting too high after meals. People who have tight control of their diabetes have fewer long-term problems caused by diabetes. Follow these instructions at home: Talk with your health care provider  or pharmacist about the type of insulin you should take and when you should take it. You should know when your insulin goes up the most (peaks) and when it wears off. You need this information so you can plan your meals and exercise. Work with your health care provider to:  Check your blood glucose every day. Your health care provider will tell you how often and when you should do this.  Manage your: ? Weight. ? Blood pressure. ? Cholesterol. ? Stress.  Eat a healthy diet.  Exercise regularly. Summary  Diabetes is a long-term (chronic) disease. It occurs when the body does not properly use sugar (glucose) that is released from food after digestion. Glucose levels are controlled by a hormone called insulin, which is made in an organ behind your stomach (pancreas).  You must use insulin correctly to control your diabetes. You must have some insulin in your body at all times.  Insulin treatment varies depending on your type of diabetes, your treatment goals, and your medical history.  Talk with your health care provider or pharmacist about the type of insulin you should take and when you should take it.  Check your blood glucose every day. Your health care provider will tell you how often and when you should check it. This information is not intended to replace advice given to you by your health care provider. Make sure you discuss any questions you have with your health care provider. Document Released: 07/13/2008 Document Revised: 04/19/2017 Document Reviewed: 05/20/2015 Elsevier Patient Education  2020 [place].    Blood Glucose Monitoring, Adult Monitoring your blood sugar (glucose) is an important part of managing your diabetes (diabetes mellitus). Blood glucose monitoring involves checking your blood glucose as often as directed and keeping a record (log) of your results over time. Checking your blood glucose regularly and keeping a blood glucose log can:  Help you and your  health care provider adjust your diabetes management plan as needed, including your medicines or insulin.  Help you understand how food, exercise, illnesses, and medicines affect your blood glucose.  Let you know what your blood glucose is at any time. You can quickly find out if you have low blood glucose (hypoglycemia) or high blood glucose (hyperglycemia). Your health care provider will set individualized treatment goals for you. Your goals will be based on your age, other medical conditions you have, and how you respond to diabetes treatment. Generally, the goal of treatment is to maintain the following blood glucose levels:  Before meals (preprandial): 80-130 mg/dL (4.4-7.2 mmol/L).  After meals (postprandial): below 180 mg/dL (10 mmol/L).  A1c level: less than 7%. Supplies needed:  Blood glucose meter.  Test strips for your meter. Each meter has its own strips. You must use the strips that came with your meter.  A needle to prick your finger (lancet). Do not use a lancet more than one time.  A device that holds the lancet (lancing device).  A journal or log book to write down your results. How to check your blood glucose  1. Wash your hands with soap and water. 2. Prick the side of your finger (not the tip) with the lancet. Use a different finger each time. 3. Gently rub the finger until a small drop of blood appears. 4. Follow instructions that come with your meter for inserting the test strip, applying blood to the strip, and using your blood glucose meter. 5. Write down your result and any notes. Some meters allow you to use areas of your body other than your finger (alternative sites) to test your blood. The most common alternative sites are:  Forearm.  Thigh.  Palm of the hand. If you think you may have hypoglycemia, or if you have a history of not knowing when your blood glucose is getting low (hypoglycemia unawareness), do not use alternative sites. Use your finger  instead. Alternative sites may not be as accurate as the fingers, because blood flow is slower in these areas. This means that the result you get may be delayed, and it may be different from the result that you would get from your finger. Follow these instructions at home: Blood glucose log   Every time you check your blood glucose, write down your result. Also write down any notes about things that may be affecting your blood glucose, such as your diet and exercise for the day. This information can help you and your health care provider: ? Look for patterns in your blood glucose over time. ? Adjust your diabetes management plan as needed.  Check if your meter allows you to download your records to a computer. Most glucose meters store a  record of glucose readings in the meter. If you have type 1 diabetes:  Check your blood glucose 2 or more times a day.  Also check your blood glucose: ? Before every insulin injection. ? Before and after exercise. ? Before meals. ? 2 hours after a meal. ? Occasionally between 2:00 a.m. and 3:00 a.m., as directed. ? Before potentially dangerous tasks, like driving or using heavy machinery. ? At bedtime.  You may need to check your blood glucose more often, up to 6-10 times a day, if you: ? Use an insulin pump. ? Need multiple daily injections (MDI). ? Have diabetes that is not well-controlled. ? Are ill. ? Have a history of severe hypoglycemia. ? Have hypoglycemia unawareness. If you have type 2 diabetes:  If you take insulin or other diabetes medicines, check your blood glucose 2 or more times a day.  If you are on intensive insulin therapy, check your blood glucose 4 or more times a day. Occasionally, you may also need to check between 2:00 a.m. and 3:00 a.m., as directed.  Also check your blood glucose: ? Before and after exercise. ? Before potentially dangerous tasks, like driving or using heavy machinery.  You may need to check your blood  glucose more often if: ? Your medicine is being adjusted. ? Your diabetes is not well-controlled. ? You are ill. General tips  Always keep your supplies with you.  If you have questions or need help, all blood glucose meters have a 24-hour "hotline" phone number that you can call. You may also contact your health care provider.  After you use a few boxes of test strips, adjust (calibrate) your blood glucose meter by following instructions that came with your meter. Contact a health care provider if:  Your blood glucose is at or above 240 mg/dL (13.3 mmol/L) for 2 days in a row.  You have been sick or have had a fever for 2 days or longer, and you are not getting better.  You have any of the following problems for more than 6 hours: ? You cannot eat or drink. ? You have nausea or vomiting. ? You have diarrhea. Get help right away if:  Your blood glucose is lower than 54 mg/dL (3 mmol/L).  You become confused or you have trouble thinking clearly.  You have difficulty breathing.  You have moderate or large ketone levels in your urine. Summary  Monitoring your blood sugar (glucose) is an important part of managing your diabetes (diabetes mellitus).  Blood glucose monitoring involves checking your blood glucose as often as directed and keeping a record (log) of your results over time.  Your health care provider will set individualized treatment goals for you. Your goals will be based on your age, other medical conditions you have, and how you respond to diabetes treatment.  Every time you check your blood glucose, write down your result. Also write down any notes about things that may be affecting your blood glucose, such as your diet and exercise for the day. This information is not intended to replace advice given to you by your health care provider. Make sure you discuss any questions you have with your health care provider. Document Released: 04/19/2003 Document Revised:  02/07/2018 Document Reviewed: 09/26/2015 Elsevier Patient Education  [place].    Diabetes Mellitus and Nutrition, Adult When you have diabetes (diabetes mellitus), it is very important to have healthy eating habits because your blood sugar (glucose) levels are greatly affected by what you  eat and drink. Eating healthy foods in the appropriate amounts, at about the same times every day, can help you:  Control your blood glucose.  Lower your risk of heart disease.  Improve your blood pressure.  Reach or maintain a healthy weight. Every person with diabetes is different, and each person has different needs for a meal plan. Your health care provider may recommend that you work with a diet and nutrition specialist (dietitian) to make a meal plan that is best for you. Your meal plan may vary depending on factors such as:  The calories you need.  The medicines you take.  Your weight.  Your blood glucose, blood pressure, and cholesterol levels.  Your activity level.  Other health conditions you have, such as heart or kidney disease. How do carbohydrates affect me? Carbohydrates, also called carbs, affect your blood glucose level more than any other type of food. Eating carbs naturally raises the amount of glucose in your blood. Carb counting is a method for keeping track of how many carbs you eat. Counting carbs is important to keep your blood glucose at a healthy level, especially if you use insulin or take certain oral diabetes medicines. It is important to know how many carbs you can safely have in each meal. This is different for every person. Your dietitian can help you calculate how many carbs you should have at each meal and for each snack. Foods that contain carbs include:  Bread, cereal, rice, pasta, and crackers.  Potatoes and corn.  Peas, beans, and lentils.  Milk and yogurt.  Fruit and juice.  Desserts, such as cakes, cookies, ice cream, and candy. How does  alcohol affect me? Alcohol can cause a sudden decrease in blood glucose (hypoglycemia), especially if you use insulin or take certain oral diabetes medicines. Hypoglycemia can be a life-threatening condition. Symptoms of hypoglycemia (sleepiness, dizziness, and confusion) are similar to symptoms of having too much alcohol. If your health care provider says that alcohol is safe for you, follow these guidelines:  Limit alcohol intake to no more than 1 drink per day for nonpregnant women and 2 drinks per day for men. One drink equals 12 oz of beer, 5 oz of wine, or 1 oz of hard liquor.  Do not drink on an empty stomach.  Keep yourself hydrated with water, diet soda, or unsweetened iced tea.  Keep in mind that regular soda, juice, and other mixers may contain a lot of sugar and must be counted as carbs. What are tips for following this plan?  Reading food labels  Start by checking the serving size on the "Nutrition Facts" label of packaged foods and drinks. The amount of calories, carbs, fats, and other nutrients listed on the label is based on one serving of the item. Many items contain more than one serving per package.  Check the total grams (g) of carbs in one serving. You can calculate the number of servings of carbs in one serving by dividing the total carbs by 15. For example, if a food has 30 g of total carbs, it would be equal to 2 servings of carbs.  Check the number of grams (g) of saturated and trans fats in one serving. Choose foods that have low or no amount of these fats.  Check the number of milligrams (mg) of salt (sodium) in one serving. Most people should limit total sodium intake to less than 2,300 mg per day.  Always check the nutrition information of foods labeled as "  low-fat" or "nonfat". These foods may be higher in added sugar or refined carbs and should be avoided.  Talk to your dietitian to identify your daily goals for nutrients listed on the label. Shopping   Avoid buying canned, premade, or processed foods. These foods tend to be high in fat, sodium, and added sugar.  Shop around the outside edge of the grocery store. This includes fresh fruits and vegetables, bulk grains, fresh meats, and fresh dairy. Cooking  Use low-heat cooking methods, such as baking, instead of high-heat cooking methods like deep frying.  Cook using healthy oils, such as olive, canola, or sunflower oil.  Avoid cooking with butter, cream, or high-fat meats. Meal planning  Eat meals and snacks regularly, preferably at the same times every day. Avoid going long periods of time without eating.  Eat foods high in fiber, such as fresh fruits, vegetables, beans, and whole grains. Talk to your dietitian about how many servings of carbs you can eat at each meal.  Eat 4-6 ounces (oz) of lean protein each day, such as lean meat, chicken, fish, eggs, or tofu. One oz of lean protein is equal to: ? 1 oz of meat, chicken, or fish. ? 1 egg. ?  cup of tofu.  Eat some foods each day that contain healthy fats, such as avocado, nuts, seeds, and fish. Lifestyle  Check your blood glucose regularly.  Exercise regularly as told by your health care provider. This may include: ? 150 minutes of moderate-intensity or vigorous-intensity exercise each week. This could be brisk walking, biking, or water aerobics. ? Stretching and doing strength exercises, such as yoga or weightlifting, at least 2 times a week.  Take medicines as told by your health care provider.  Do not use any products that contain nicotine or tobacco, such as cigarettes and e-cigarettes. If you need help quitting, ask your health care provider.  Work with a Social worker or diabetes educator to identify strategies to manage stress and any emotional and social challenges. Questions to ask a health care provider  Do I need to meet with a diabetes educator?  Do I need to meet with a dietitian?  What number can I call if I  have questions?  When are the best times to check my blood glucose? Where to find more information:  American Diabetes Association: diabetes.org  Academy of Nutrition and Dietetics: www.eatright.[employer] of Diabetes and Digestive and Kidney Diseases (NIH): [employer] Summary  A healthy meal plan will help you control your blood glucose and maintain a healthy lifestyle.  Working with a diet and nutrition specialist (dietitian) can help you make a meal plan that is best for you.  Keep in mind that carbohydrates (carbs) and alcohol have immediate effects on your blood glucose levels. It is important to count carbs and to use alcohol carefully. This information is not intended to replace advice given to you by your health care provider. Make sure you discuss any questions you have with your health care provider. Document Released: 01/11/2005 Document Revised: 03/29/2017 Document Reviewed: 05/21/2016 Elsevier Patient Education  2020 [employer].

## 2018-10-28 LAB — COMPREHENSIVE METABOLIC PANEL
ALT: 82 IU/L — ABNORMAL HIGH (ref 0–44)
AST: 105 IU/L — ABNORMAL HIGH (ref 0–40)
Albumin/Globulin Ratio: 1.8 (ref 1.2–2.2)
Albumin: 4.7 g/dL (ref 4.0–5.0)
Alkaline Phosphatase: 97 IU/L (ref 39–117)
BUN/Creatinine Ratio: 24 — ABNORMAL HIGH (ref 9–20)
BUN: 25 mg/dL — ABNORMAL HIGH (ref 6–20)
Bilirubin Total: 0.8 mg/dL (ref 0.0–1.2)
CO2: 20 mmol/L (ref 20–29)
Calcium: 9.8 mg/dL (ref 8.7–10.2)
Chloride: 81 mmol/L — ABNORMAL LOW (ref 96–106)
Creatinine, Ser: 1.06 mg/dL (ref 0.76–1.27)
GFR calc Af Amer: 102 mL/min/{1.73_m2} (ref >59)
GFR calc non Af Amer: 88 mL/min/{1.73_m2} (ref >59)
Globulin, Total: 2.6 g/dL (ref 1.5–4.5)
Glucose: 533 mg/dL (ref 65–99)
Potassium: 4.4 mmol/L (ref 3.5–5.2)
Sodium: 122 mmol/L — ABNORMAL LOW (ref 134–144)
Total Protein: 7.3 g/dL (ref 6.0–8.5)

## 2018-10-28 LAB — CBC WITH DIFFERENTIAL/PLATELET
Basophils Absolute: 0.1 10*3/uL (ref 0.0–0.2)
Basos: 1 %
EOS (ABSOLUTE): 0.3 10*3/uL (ref 0.0–0.4)
Eos: 3 %
Hematocrit: 49.2 % (ref 37.5–51.0)
Hemoglobin: 17.6 g/dL (ref 13.0–17.7)
Immature Grans (Abs): 0 10*3/uL (ref 0.0–0.1)
Immature Granulocytes: 0 %
Lymphocytes Absolute: 2.7 10*3/uL (ref 0.7–3.1)
Lymphs: 28 %
MCH: 32.8 pg (ref 26.6–33.0)
MCHC: 35.8 g/dL — ABNORMAL HIGH (ref 31.5–35.7)
MCV: 92 fL (ref 79–97)
Monocytes Absolute: 0.7 10*3/uL (ref 0.1–0.9)
Monocytes: 7 %
Neutrophils Absolute: 6 10*3/uL (ref 1.4–7.0)
Neutrophils: 61 %
Platelets: 270 10*3/uL (ref 150–450)
RBC: 5.37 x10E6/uL (ref 4.14–5.80)
RDW: 11.8 % (ref 11.6–15.4)
WBC: 9.8 10*3/uL (ref 3.4–10.8)

## 2018-10-28 LAB — LIPID PANEL
Chol/HDL Ratio: 34.2 ratio — ABNORMAL HIGH (ref 0.0–5.0)
Cholesterol, Total: 342 mg/dL — ABNORMAL HIGH (ref 100–199)
HDL: 10 mg/dL — ABNORMAL LOW (ref >39)
Triglycerides: 3032 mg/dL (ref 0–149)

## 2018-10-28 LAB — HEMOGLOBIN A1C
Est. average glucose Bld gHb Est-mCnc: 246 mg/dL
Hgb A1c MFr Bld: 10.2 % — ABNORMAL HIGH (ref 4.8–5.6)

## 2018-10-28 LAB — MAGNESIUM: Magnesium: 2.3 mg/dL (ref 1.6–2.3)

## 2018-10-28 LAB — LDL CHOLESTEROL, DIRECT: LDL Direct: 61 mg/dL (ref 0–99)

## 2018-10-28 LAB — MICROALBUMIN / CREATININE URINE RATIO
Creatinine, Urine: 66.9 mg/dL
Microalb/Creat Ratio: 57 mg/g creat — ABNORMAL HIGH (ref 0–29)
Microalbumin, Urine: 38 ug/mL

## 2018-10-28 LAB — PHOSPHORUS: Phosphorus: 4.1 mg/dL (ref 2.8–4.1)

## 2018-10-28 LAB — T4, FREE: Free T4: 1.15 ng/dL (ref 0.82–1.77)

## 2018-10-28 LAB — TSH: TSH: 1.32 u[IU]/mL (ref 0.450–4.500)

## 2018-10-28 LAB — INSULIN, RANDOM: INSULIN: 25.3 u[IU]/mL — ABNORMAL HIGH (ref 2.6–24.9)

## 2018-10-30 LAB — LIPASE: Lipase: 41 U/L (ref 13–78)

## 2018-10-30 LAB — SPECIMEN STATUS REPORT

## 2018-10-30 LAB — AMYLASE: Amylase: 27 U/L — ABNORMAL LOW (ref 31–110)

## 2018-11-03 ENCOUNTER — Ambulatory Visit: Payer: Medicare Other | Admitting: Family Medicine

## 2018-11-04 ENCOUNTER — Other Ambulatory Visit: Payer: Self-pay

## 2018-11-05 ENCOUNTER — Other Ambulatory Visit: Payer: Medicare Other

## 2018-11-05 ENCOUNTER — Other Ambulatory Visit: Payer: Self-pay

## 2018-11-05 DIAGNOSIS — I10 Essential (primary) hypertension: Secondary | ICD-10-CM

## 2018-11-06 ENCOUNTER — Other Ambulatory Visit: Payer: Medicare Other

## 2018-11-06 ENCOUNTER — Encounter: Payer: Self-pay | Admitting: Internal Medicine

## 2018-11-06 ENCOUNTER — Ambulatory Visit (INDEPENDENT_AMBULATORY_CARE_PROVIDER_SITE_OTHER): Payer: Medicare Other | Admitting: Internal Medicine

## 2018-11-06 ENCOUNTER — Other Ambulatory Visit: Payer: Self-pay

## 2018-11-06 VITALS — BP 150/80 | HR 90 | Ht 68.0 in | Wt 221.0 lb

## 2018-11-06 DIAGNOSIS — I10 Essential (primary) hypertension: Secondary | ICD-10-CM | POA: Diagnosis not present

## 2018-11-06 DIAGNOSIS — E1165 Type 2 diabetes mellitus with hyperglycemia: Secondary | ICD-10-CM

## 2018-11-06 DIAGNOSIS — Z794 Long term (current) use of insulin: Secondary | ICD-10-CM

## 2018-11-06 DIAGNOSIS — L409 Psoriasis, unspecified: Secondary | ICD-10-CM | POA: Diagnosis not present

## 2018-11-06 DIAGNOSIS — B360 Pityriasis versicolor: Secondary | ICD-10-CM | POA: Diagnosis not present

## 2018-11-06 MED ORDER — NOVOLOG FLEXPEN 100 UNIT/ML ~~LOC~~ SOPN: 10.0000 [IU] | PEN_INJECTOR | Freq: Three times a day (TID) | SUBCUTANEOUS | 11 refills | Status: DC

## 2018-11-06 NOTE — Progress Notes (Signed)
Patient ID: Louis Carney, male   DOB: 08-16-1980, 38 y.o.   MRN: [id number]   HPI: Louis Carney is a 38 y.o.-year-old male, referred by his PCP, Dr. Raliegh Scarlet, for management of DM2, dx in 09/2018, insulin-dependent, uncontrolled, without long-term complications.  He had dizziness, blurry vision >> checked sugar with neighbor's meter >> CBG high (HI, then rechecked: 540).  He saw PCP >> HbA1c was high x2 >> added insulin.  He feels much better after adding insulin blood sugars did not improve to goal yet.   Reviewed latest HbA1c levels: Lab Results  Component Value Date   HGBA1C 9.9 (A) 10/27/2018   HGBA1C 10.2 (H) 10/27/2018   HGBA1C 5.6 03/06/2018   HGBA1C 5.3 08/26/2017   Pt is on a regimen of: - Lantus 25 units 2x a day  Pt checks his sugars 4x a day and they are: - am: 202-300 - 2h after b'fast: n/c - before lunch: 300-340 - 2h after lunch: n/c - before dinner: 340 - 2h after dinner: n/c - bedtime: 340 - nighttime: n/c Lowest sugar was 202; no hypoglycemia awareness ?  Highest sugar was 460.  Glucometer:ReliOn  Pt's meals are: - Breakfast: skips - Lunch: [place] sandwich  - Dinner: lasagna, hamburger, steak + potatoes, pasta - Snacks:cookies, chips. He does not like greens. Drinks 4-6 shots of vodka, 6 pack beer EVERY night.  - no history of CKD, last BUN/creatinine:  Lab Results  Component Value Date   BUN 25 (H) 10/27/2018   BUN 8 03/06/2018   CREATININE 1.06 10/27/2018   CREATININE 0.80 03/06/2018  On olmesartan.  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  - no numbness and tingling in his feet.  Pt has FH of DM in father - type 2 DM.  He is  on Risankizumab in 04/2018 for psoriasis >> working. Prev. Embrel, Humira, etc.  ROS: Constitutional: no weight gain, no weight loss, + fatigue, + subjective hyperthermia, no subjective hypothermia, + nocturia, + increased urination, + poor sleep Eyes: + Blurry vision, no xerophthalmia ENT: no sore throat, no nodules palpated in neck, no dysphagia, no odynophagia, no hoarseness, + tinnitus, no hypoacusis Cardiovascular: no CP, no SOB, no palpitations, no leg swelling Respiratory: no cough, no SOB, no wheezing Gastrointestinal: no N, no V, no D, no C, + acid reflux Musculoskeletal: + muscle, no joint aches Skin: no rash, no hair loss Neurological: no tremors, + numbness or tingling/no dizziness/+ hAs Psychiatric: no depression, no anxiety + Low libido  Past Medical History:  Diagnosis Date  . Arthritis   . GERD (gastroesophageal reflux disease)   . Headache    migraines  . Hypertension   . Psoriasis    since age 60 years  . Tuberculosis    latent TB infection on treatment   Past Surgical History:  Procedure Laterality Date  . CARPAL TUNNEL RELEASE Right 01/15/2018   Procedure: Right Carpal tunnel release;  Surgeon: Ashok Pall, MD;  Location: [place];  Service: Neurosurgery;  Laterality: Right;  Right Carpal tunnel release  . CERVICAL SPINE SURGERY    . LUMBAR LAMINECTOMY/DECOMPRESSION MICRODISCECTOMY Right 03/07/2018  Procedure: Right Lumbar 5 Sacral 1 Microdiscectomy;  Surgeon: Ashok Pall, MD;  Location: [facility];  Service: Neurosurgery;  Laterality: Right;  Right Lumbar 5 Sacral 1 Microdiscectomy  . NECK SURGERY     Social History   Socioeconomic History  . Marital status: Married    Spouse name: Not on file  . Number of children: Not on file  . Years of education: Not on file  . Highest education level: Not on file  Occupational History  . Not on file  Social Needs  . Financial resource strain: Not on file  . Food insecurity    Worry: Not on file    Inability: Not  on file  . Transportation needs    Medical: Not on file    Non-medical: Not on file  Tobacco Use  . Smoking status: Current Every Day Smoker    Packs/day: 1.00    Years: 15.00    Pack years: 15.00    Types: Cigarettes  . Smokeless tobacco: Never Used  Substance and Sexual Activity  . Alcohol use: Yes    Alcohol/week: 24.0 standard drinks    Types: 24 Standard drinks or equivalent per week  . Drug use: Never  . Sexual activity: Yes    Birth control/protection: None  Lifestyle  . Physical activity    Days per week: Not on file    Minutes per session: Not on file  . Stress: Not on file  Relationships  . Social Herbalist on phone: Not on file    Gets together: Not on file    Attends religious service: Not on file    Active member of club or organization: Not on file    Attends meetings of clubs or organizations: Not on file    Relationship status: Not on file  . Intimate partner violence    Fear of current or ex partner: Not on file    Emotionally abused: Not on file    Physically abused: Not on file    Forced sexual activity: Not on file  Other Topics Concern  . Not on file  Social History Narrative  . Not on file   Current Outpatient Medications on File Prior to Visit  Medication Sig Dispense Refill  . blood glucose meter kit and supplies Dispense based on patient and insurance preference. Use to check glucose level three times daily before meals and 3 times daily 2 hours after meals (6 times daily total). (FOR ICD-10 E10.9, E11.9). 1 each 0  . esomeprazole (NEXIUM) 20 MG capsule Take 20 mg by mouth daily at 12 noon.    . gabapentin (NEURONTIN) 300 MG capsule Take 2 capsules by mouth 2 (two) times daily.    . Insulin Glargine (LANTUS SOLOSTAR) 100 UNIT/ML Solostar Pen Inject 15 units into the skin twice daily-12 hours apart. 5 pen PRN  . Olmesartan-amLODIPine-HCTZ 40-10-25 MG TABS 1QD (Patient taking differently: Take 1 tablet by mouth daily. ) 90 tablet 0  .  omega-3 acid ethyl esters (LOVAZA) 1 g capsule Take 2 capsules (2 g total) by mouth 2 (two) times daily. 180 capsule 3  . Risankizumab-rzaa,150 MG Dose, (SKYRIZI, 150 MG DOSE,) 75 MG/0.83ML PSKT Inject 300 mg into the skin every 3 (three) months. 150 mg (1 injection) in each leg every 3 months    . sildenafil (VIAGRA) 100 MG tablet TAKE ONE-HALF TO ONE TABLET BY MOUTH 30 MINUTES PRIOR TO INTERCOURSE AS NEEDED 30 tablet 0  . tiZANidine (ZANAFLEX) 4 MG  tablet Take 1 tablet (4 mg total) by mouth every 6 (six) hours as needed for muscle spasms. 60 tablet 0   No current facility-administered medications on file prior to visit.    No Known Allergies Family History  Problem Relation Age of Onset  . Cancer Mother        LUNG  . Alcohol abuse Mother   . Hypertension Mother   . Alcohol abuse Father   . Hypertension Father     PE: BP (!) 150/80   Pulse 90   Ht 5' 8" (1.727 m)   Wt 221 lb (100.2 kg)   SpO2 99%   BMI 33.60 kg/m  Wt Readings from Last 3 Encounters:  11/06/18 221 lb (100.2 kg)  10/27/18 222 lb 12.8 oz (101.1 kg)  09/09/18 230 lb (104.3 kg)   Constitutional: Obese-truncal distribution, in NAD Eyes: PERRLA, EOMI, no exophthalmos ENT: moist mucous membranes, no thyromegaly, no cervical lymphadenopathy Cardiovascular: RRR, No MRG Respiratory: CTA B Gastrointestinal: abdomen soft, NT, ND, BS+ Musculoskeletal: no deformities, strength intact in all 4 Skin: moist, warm, no rashes Neurological: no tremor with outstretched hands, DTR normal in all 4  ASSESSMENT: 1. DM, insulin-dependent, newly diagnosed  PLAN:  1. Patient with new diagnosis of diabetes, insulin-dependent.  She was recently found to have an HbA1c of 10.2% 2 weeks ago.  Long-acting insulin was added.  Since sugars remain high, the dose of Lantus was increased to 50 units a day split into doses.  Even on this regimen, his sugars remain very high.  Upon questioning, he is having a very poor diet, with low fiber, and  also has a sweet drinks in his diet and, more importantly, he is drinking a lot of alcohol.  We discussed about the fact that this may have affected his pancreas and could be the cause of his diabetes.  I am not sure whether his Risankizumab, is playing a role. -We discussed about the importance of cutting down and ultimately stopping drinking.  He is receptive to this although I did acknowledge that this will be a difficult task for him to do.  I also suggested to cut out sweet drinks and discussed about healthier alternatives especially by reducing fatty foods and also to eliminate cereals with whole milk.  He agrees a referral to nutrition. -In the meantime, we will continue the current dose of Lantus insulin and a twice daily dosage.  However, I advised him to also add NovoLog before meals.  We discussed about correct injection techniques.  He is currently injecting Lantus in his leg and I advised him to try to inject in his abdomen. -At next visit, I plan to check her for type 1 diabetes - I suggested to:  Patient Instructions  Please schedule an appt with Laura Jobe with nutrition.  Please continue: - Lantus 25 units 2x a day  Add: - Novolog 10-14 units 15 min before lunch and dinner  STOP MILK and CEREALS.  REDUCE EGGS.  REDUCE ALCOHOL.  Please return in 2 months with your sugar log.   - Strongly advised him to start checking sugars at different times of the day - check 2-3x a day, rotating checks - discussed about CBG targets for treatment: 80-130 mg/dL before meals and <180 mg/dL after meals; target HbA1c <7%. - given sugar log and advised how to fill it and to bring it at next appt  - given foot care handout and explained the principles  - given instructions for hypoglycemia   management "15-15 rule"  - advised for yearly eye exams  - Return to clinic in 3 mo with sugar log   Philemon Kingdom, MD PhD [facility] Endocrinology

## 2018-11-06 NOTE — Patient Instructions (Signed)
Please schedule an appt with Louis Carney with nutrition.  Please continue: - Lantus 25 units 2x a day  Add: - Novolog 10-14 units 15 min before lunch and dinner  STOP MILK and CEREALS.  REDUCE EGGS.  REDUCE ALCOHOL.  Please return in 2 months with your sugar log.   PATIENT INSTRUCTIONS FOR TYPE 2 DIABETES:  DIET AND EXERCISE Diet and exercise is an important part of diabetic treatment.  We recommended aerobic exercise in the form of brisk walking (working between 40-60% of maximal aerobic capacity, similar to brisk walking) for 150 minutes per week (such as 30 minutes five days per week) along with 3 times per week performing 'resistance' training (using various gauge rubber tubes with handles) 5-10 exercises involving the major muscle groups (upper body, lower body and core) performing 10-15 repetitions (or near fatigue) each exercise. Start at half the above goal but build slowly to reach the above goals. If limited by weight, joint pain, or disability, we recommend daily walking in a swimming pool with water up to waist to reduce pressure from joints while allow for adequate exercise.    BLOOD GLUCOSES Monitoring your blood glucoses is important for continued management of your diabetes. Please check your blood glucoses 2-4 times a day: fasting, before meals and at bedtime (you can rotate these measurements - e.g. one day check before the 3 meals, the next day check before 2 of the meals and before bedtime, etc.).   HYPOGLYCEMIA (low blood sugar) Hypoglycemia is usually a reaction to not eating, exercising, or taking too much insulin/ other diabetes drugs.  Symptoms include tremors, sweating, hunger, confusion, headache, etc. Treat IMMEDIATELY with 15 grams of Carbs: . 4 glucose tablets .  cup regular juice/soda . 2 tablespoons raisins . 4 teaspoons sugar . 1 tablespoon honey Recheck blood glucose in 15 mins and repeat above if still symptomatic/blood glucose  <100.  RECOMMENDATIONS TO REDUCE YOUR RISK OF DIABETIC COMPLICATIONS: * Take your prescribed MEDICATION(S) * Follow a DIABETIC diet: Complex carbs, fiber rich foods, (monounsaturated and polyunsaturated) fats * AVOID saturated/trans fats, high fat foods, >2,300 mg salt per day. * EXERCISE at least 5 times a week for 30 minutes or preferably daily.  * DO NOT SMOKE OR DRINK more than 1 drink a day. * Check your FEET every day. Do not wear tightfitting shoes. Contact us if you develop an ulcer * See your EYE doctor once a year or more if needed * Get a FLU shot once a year * Get a PNEUMONIA vaccine once before and once after age 66 years  GOALS:  * Your Hemoglobin A1c of <7%  * fasting sugars need to be <130 * after meals sugars need to be <180 (2h after you start eating) * Your Systolic BP should be 295 or lower  * Your Diastolic BP should be 80 or lower  * Your HDL (Good Cholesterol) should be 40 or higher  * Your LDL (Bad Cholesterol) should be 100 or lower. * Your Triglycerides should be 150 or lower  * Your Urine microalbumin (kidney function) should be <30 * Your Body Mass Index should be 25 or lower    Please consider the following ways to cut down carbs and fat and increase fiber and micronutrients in your diet: - substitute whole grain for white bread or pasta - substitute brown rice for white rice - substitute 90-calorie flat bread pieces for slices of bread when possible - substitute sweet potatoes or yams for white  potatoes - substitute humus for margarine - substitute tofu for cheese when possible - substitute almond or rice milk for regular milk (would not drink soy milk daily due to concern for soy estrogen influence on breast cancer risk) - substitute dark chocolate for other sweets when possible - substitute water - can add lemon or orange slices for taste - for diet sodas (artificial sweeteners will trick your body that you can eat sweets without getting calories and  will lead you to overeating and weight gain in the long run) - do not skip breakfast or other meals (this will slow down the metabolism and will result in more weight gain over time)  - can try smoothies made from fruit and almond/rice milk in am instead of regular breakfast - can also try old-fashioned (not instant) oatmeal made with almond/rice milk in am - order the dressing on the side when eating salad at a restaurant (pour less than half of the dressing on the salad) - eat as little meat as possible - can try juicing, but should not forget that juicing will get rid of the fiber, so would alternate with eating raw veg./fruits or drinking smoothies - use as little oil as possible, even when using olive oil - can dress a salad with a mix of balsamic vinegar and lemon juice, for e.g. - use agave nectar, stevia sugar, or regular sugar rather than artificial sweateners - steam or broil/roast veggies  - snack on veggies/fruit/nuts (unsalted, preferably) when possible, rather than processed foods - reduce or eliminate aspartame in diet (it is in diet sodas, chewing gum, etc) Read the labels!  Try to read Dr. Janene Harvey book: "Program for Reversing Diabetes" for other ideas for healthy eating.

## 2018-11-07 ENCOUNTER — Other Ambulatory Visit: Payer: Self-pay | Admitting: Family Medicine

## 2018-11-07 DIAGNOSIS — IMO0002 Reserved for concepts with insufficient information to code with codable children: Secondary | ICD-10-CM | POA: Insufficient documentation

## 2018-11-07 DIAGNOSIS — R110 Nausea: Secondary | ICD-10-CM | POA: Insufficient documentation

## 2018-11-07 DIAGNOSIS — R7989 Other specified abnormal findings of blood chemistry: Secondary | ICD-10-CM | POA: Insufficient documentation

## 2018-11-07 DIAGNOSIS — E781 Pure hyperglyceridemia: Secondary | ICD-10-CM

## 2018-11-07 DIAGNOSIS — K769 Liver disease, unspecified: Secondary | ICD-10-CM

## 2018-11-07 DIAGNOSIS — F1010 Alcohol abuse, uncomplicated: Secondary | ICD-10-CM

## 2018-11-07 DIAGNOSIS — E1065 Type 1 diabetes mellitus with hyperglycemia: Secondary | ICD-10-CM | POA: Insufficient documentation

## 2018-11-07 LAB — COMPREHENSIVE METABOLIC PANEL
ALT: 394 IU/L — ABNORMAL HIGH (ref 0–44)
AST: 220 IU/L — ABNORMAL HIGH (ref 0–40)
Albumin/Globulin Ratio: 1.8 (ref 1.2–2.2)
Albumin: 4.5 g/dL (ref 4.0–5.0)
Alkaline Phosphatase: 85 IU/L (ref 39–117)
BUN/Creatinine Ratio: 10 (ref 9–20)
BUN: 8 mg/dL (ref 6–20)
Bilirubin Total: 0.5 mg/dL (ref 0.0–1.2)
CO2: 20 mmol/L (ref 20–29)
Calcium: 9.9 mg/dL (ref 8.7–10.2)
Chloride: 97 mmol/L (ref 96–106)
Creatinine, Ser: 0.79 mg/dL (ref 0.76–1.27)
GFR calc Af Amer: 131 mL/min/{1.73_m2} (ref >59)
GFR calc non Af Amer: 113 mL/min/{1.73_m2} (ref >59)
Globulin, Total: 2.5 g/dL (ref 1.5–4.5)
Glucose: 455 mg/dL — ABNORMAL HIGH (ref 65–99)
Potassium: 4.8 mmol/L (ref 3.5–5.2)
Sodium: 135 mmol/L (ref 134–144)
Total Protein: 7 g/dL (ref 6.0–8.5)

## 2018-11-07 NOTE — Progress Notes (Signed)
Told CMA to please call pt and let him know he needs to come in for additional lab draw if labs cannot be added on to recent ones- doubtful due to amnt etc.  Orders Placed This Encounter  Procedures  . US LIVER DOPPLER  . Transferrin Saturation  . Iron and TIBC  . Hepatitis panel, acute  . Lactate dehydrogenase  . Bilirubin, fractionated(tot/dir/indir)  . Alpha-1-antitrypsin  . Anti-smooth muscle antibody, IgG  . Ceruloplasmin  . AntiMicrosomal Ab-Liver / Kidney  . ANA  . Ambulatory referral to Gastroenterology   Also tell pt that his liver enzymes really took a jump- this is not good and warrents an evaluation by a gastroenterologist, an [place] of his liver and additional labs.   - Tell him to stop tylenol and NSAIDS, stop ETOH completely, stop all herbs/ OTC supplements that may be having affect on his liver.   Good news is, his sodium levels have corrected- which was the most worrisome prior.

## 2018-11-11 ENCOUNTER — Other Ambulatory Visit: Payer: Medicare Other

## 2018-11-17 ENCOUNTER — Other Ambulatory Visit: Payer: Self-pay

## 2018-11-17 DIAGNOSIS — I10 Essential (primary) hypertension: Secondary | ICD-10-CM

## 2018-11-17 MED ORDER — OLMESARTAN-AMLODIPINE-HCTZ 40-10-25 MG PO TABS: 1.0000 | ORAL_TABLET | Freq: Every day | ORAL | 0 refills | Status: DC

## 2018-11-21 ENCOUNTER — Other Ambulatory Visit: Payer: Self-pay | Admitting: Family Medicine

## 2018-11-21 DIAGNOSIS — E1065 Type 1 diabetes mellitus with hyperglycemia: Secondary | ICD-10-CM

## 2018-11-21 DIAGNOSIS — R110 Nausea: Secondary | ICD-10-CM

## 2018-11-21 DIAGNOSIS — F1010 Alcohol abuse, uncomplicated: Secondary | ICD-10-CM

## 2018-11-21 DIAGNOSIS — K769 Liver disease, unspecified: Secondary | ICD-10-CM

## 2018-11-21 DIAGNOSIS — IMO0002 Reserved for concepts with insufficient information to code with codable children: Secondary | ICD-10-CM

## 2018-11-21 DIAGNOSIS — R7989 Other specified abnormal findings of blood chemistry: Secondary | ICD-10-CM

## 2018-11-25 ENCOUNTER — Telehealth: Payer: Self-pay | Admitting: Family Medicine

## 2018-11-25 NOTE — Telephone Encounter (Signed)
US Liver Doppler was ordered 11/07/2018 due to results of labs done on 11/06/2018 - elevated liver enzymes.  Per imaging radiologist wants to include [place] Abd per protocol . - they need you to sign off on additional orders. MPulliam, CMA/RT(R)

## 2018-11-25 NOTE — Telephone Encounter (Signed)
Order has been sent to you they just need you to sign the orser. MPulliam, CMA/RT(R)

## 2018-11-25 NOTE — Telephone Encounter (Signed)
Melody @ [facility] needs provider signature for US abdomen & US doppler for patient.   Forwarding message to medical asst. To call [phone] if any questions otherwise just have Dr. Jenetta Downer go to her in basket & sign order.  --Thanks Baker Janus

## 2018-11-25 NOTE — Telephone Encounter (Signed)
What is he getting an abdominal ultrasound and Doppler for?  Was this something that we ordered?  Please advise me on the history

## 2018-11-25 NOTE — Telephone Encounter (Signed)
That is fine.  Thank you for letting me know.  I will sign off on that now - Patient does have an appointment 12/10/2018 with Dr. Carlean Purl of gastroenterology.

## 2018-11-28 ENCOUNTER — Other Ambulatory Visit: Payer: Self-pay | Admitting: Family Medicine

## 2018-12-01 ENCOUNTER — Telehealth: Payer: Self-pay | Admitting: Family Medicine

## 2018-12-02 ENCOUNTER — Ambulatory Visit: Payer: Medicare Other

## 2018-12-09 ENCOUNTER — Ambulatory Visit: Payer: Medicare Other | Admitting: Dietician

## 2018-12-09 ENCOUNTER — Ambulatory Visit: Payer: Medicare Other

## 2018-12-10 ENCOUNTER — Ambulatory Visit: Payer: Medicare Other | Admitting: Internal Medicine

## 2018-12-15 ENCOUNTER — Other Ambulatory Visit: Payer: Self-pay | Admitting: Family Medicine

## 2018-12-15 DIAGNOSIS — IMO0002 Reserved for concepts with insufficient information to code with codable children: Secondary | ICD-10-CM

## 2018-12-15 DIAGNOSIS — F1010 Alcohol abuse, uncomplicated: Secondary | ICD-10-CM

## 2018-12-15 DIAGNOSIS — K769 Liver disease, unspecified: Secondary | ICD-10-CM

## 2018-12-15 DIAGNOSIS — R110 Nausea: Secondary | ICD-10-CM

## 2018-12-15 DIAGNOSIS — R7989 Other specified abnormal findings of blood chemistry: Secondary | ICD-10-CM

## 2018-12-15 DIAGNOSIS — E1065 Type 1 diabetes mellitus with hyperglycemia: Secondary | ICD-10-CM

## 2018-12-16 ENCOUNTER — Ambulatory Visit: Payer: Medicare Other

## 2018-12-17 ENCOUNTER — Other Ambulatory Visit: Payer: Medicare Other

## 2018-12-18 ENCOUNTER — Ambulatory Visit: Payer: Medicare Other | Admitting: Registered"

## 2018-12-25 ENCOUNTER — Ambulatory Visit
Admission: RE | Admit: 2018-12-25 | Discharge: 2018-12-25 | Disposition: A | Discharge status: Home / Self Care | Payer: Medicare Other | Source: Ambulatory Visit | Attending: Family Medicine | Admitting: Family Medicine

## 2018-12-25 DIAGNOSIS — R7989 Other specified abnormal findings of blood chemistry: Secondary | ICD-10-CM

## 2018-12-25 DIAGNOSIS — E1065 Type 1 diabetes mellitus with hyperglycemia: Secondary | ICD-10-CM

## 2018-12-25 DIAGNOSIS — F1010 Alcohol abuse, uncomplicated: Secondary | ICD-10-CM

## 2018-12-25 DIAGNOSIS — IMO0002 Reserved for concepts with insufficient information to code with codable children: Secondary | ICD-10-CM

## 2018-12-25 DIAGNOSIS — N200 Calculus of kidney: Secondary | ICD-10-CM | POA: Diagnosis not present

## 2018-12-25 DIAGNOSIS — R110 Nausea: Secondary | ICD-10-CM

## 2018-12-25 DIAGNOSIS — K760 Fatty (change of) liver, not elsewhere classified: Secondary | ICD-10-CM | POA: Diagnosis not present

## 2018-12-25 DIAGNOSIS — K769 Liver disease, unspecified: Secondary | ICD-10-CM

## 2019-01-01 ENCOUNTER — Other Ambulatory Visit: Payer: Self-pay | Admitting: Family Medicine

## 2019-01-01 MED ORDER — SILDENAFIL CITRATE 100 MG PO TABS: ORAL_TABLET | ORAL | 0 refills | Status: DC

## 2019-01-01 NOTE — Telephone Encounter (Signed)
Patient called states he is completely out of :    sildenafil (VIAGRA) 100 MG tablet [id number]   Order Details Dose, Route, Frequency: As Directed  Dispense Quantity: 30 tablet Refills: 0 Fills remaining: --        Sig: TAKE 1/2 (ONE-HALF) TO 1 TABLET BY MOUTH 30 MINUTES PRIOR TO INTERCOURSE AS NEEDED.     ---Forwarding request to medical assistant that if approved send refill order to :    [facility] [address], [place] - [place] [phone] (Phone) [phone] (Fax)   --glh

## 2019-01-06 ENCOUNTER — Telehealth: Payer: Self-pay | Admitting: Family Medicine

## 2019-01-06 NOTE — Telephone Encounter (Signed)
Pt's wife called states Dr. Jenetta Downer increased his insulin injection but not Rx ( Pharmacy will not give them a refill because its now time)---forwarding request to medical assistant see provider notes from chart below:    Insulin Glargine (LANTUS SOLOSTAR) 100 UNIT/ML Solostar Pen    Sig: Inject 15 units into the skin twice daily-12 hours apart.    Dispense:  5 pen    Refill:  PRN   blood glucose meter kit and supplies    Sig: Dispense based on patient and insurance preference. Use to check glucose level three times daily before meals and 3 times daily 2 hours after meals (6 times daily total). (FOR ICD-10 E10.9, E11.9).    Dispense:  1 each   --forwarding request to send refill order to :   [facility] [address], [place] - [place] [phone] (Phone) [phone] (Fax)  ---glh

## 2019-01-07 NOTE — Telephone Encounter (Signed)
Pt is now seeing Dr. Letta Median.  Advised pt to call endocrinologist, Dr. Letta Median, for refills of medications and any other needs related to his DM.  Pt expressed understanding and is agreeable.  Charyl Bigger, CMA

## 2019-01-14 ENCOUNTER — Ambulatory Visit: Payer: Medicare Other | Admitting: Family Medicine

## 2019-01-14 ENCOUNTER — Other Ambulatory Visit: Payer: Self-pay

## 2019-01-15 ENCOUNTER — Encounter: Payer: Medicare Other | Attending: Internal Medicine | Admitting: *Deleted

## 2019-01-15 DIAGNOSIS — IMO0002 Reserved for concepts with insufficient information to code with codable children: Secondary | ICD-10-CM

## 2019-01-15 DIAGNOSIS — E1065 Type 1 diabetes mellitus with hyperglycemia: Secondary | ICD-10-CM | POA: Diagnosis not present

## 2019-01-15 NOTE — Progress Notes (Signed)
Diabetes Self-Management Education  Visit Type: First/Initial  Appt. Start Time: 1530 Appt. End Time: 1700  01/15/2019  [name], identified by name and date of birth, is a 38 y.o. male with a diagnosis of Diabetes: Type 2. Patient is newly diagnosed with diabetes, the type is to be determined with his next visit with his endocrinologist. He is already disabled with severe back pain and numbness on the whole right side of his body. He is taking his insulin and checking his blood sugars as directed but would like more information on nutrition guidelines for diabetes. Also of note, he was drinking several shots of Vodka a night as well as multiple six packs of beer. He states he has stopped the Vodka completely and is down to a couple of Bud Lights at night now. He states the beer helps him sleep as he is awake every 2 hours moving his body in pain throughout the night.   ASSESSMENT  There were no vitals taken for this visit. There is no height or weight on file to calculate BMI.  Diabetes Self-Management Education - 01/15/19 1536      Visit Information   Visit Type  First/Initial      Initial Visit   Diabetes Type  Type 2    Are you currently following a meal plan?  No    Are you taking your medications as prescribed?  Yes    Date Diagnosed  11/2018      Health Coping   How would you rate your overall health?  Fair      Psychosocial Assessment   Patient Belief/Attitude about Diabetes  Motivated to manage diabetes    Self-care barriers  Debilitated state due to current medical condition    Self-management support  Family    Other persons present  Patient    Patient Concerns  Nutrition/Meal planning;Glycemic Control    Special Needs  None    Preferred Learning Style  Auditory;Visual;Hands on    Parachute in progress    How often do you need to have someone help you when you read instructions, pamphlets, or other written materials from your doctor or  pharmacy?  1 - Never    What is the last grade level you completed in school?  11th      Pre-Education Assessment   Patient understands the diabetes disease and treatment process.  Needs Instruction    Patient understands incorporating nutritional management into lifestyle.  Needs Instruction    Patient undertands incorporating physical activity into lifestyle.  Needs Instruction    Patient understands using medications safely.  Needs Review    Patient understands monitoring blood glucose, interpreting and using results  Needs Instruction    Patient understands prevention, detection, and treatment of acute complications.  Needs Instruction    Patient understands prevention, detection, and treatment of chronic complications.  Needs Instruction    Patient understands how to develop strategies to address psychosocial issues.  Needs Instruction    Patient understands how to develop strategies to promote health/change behavior.  Needs Instruction      Complications   Last HgB A1C per patient/outside source  9.9 %    How often do you check your blood sugar?  > 4 times/day    Fasting Blood glucose range (mg/dL)  70-129    Postprandial Blood glucose range (mg/dL)  70-129;130-179    Have you had a dilated eye exam in the past 12 months?  No  Have you had a dental exam in the past 12 months?  Yes    Are you checking your feet?  Yes    How many days per week are you checking your feet?  7      Dietary Intake   Breakfast  skips    Lunch  sandwich, salami, cheese, bread, occasionally chips    Snack (afternoon)  no    Dinner  wife cooks most nights: meat, starch (pasta), he doesn't care for vegetables    Snack (evening)  no    Beverage(s)  almost every night: couple beers, has stopped liquor (Vodka), water all day, occasionally with lemon, G2 Gatorade (no soda)      Exercise   Exercise Type  ADL's   disabled with severe back pain   How many days per week to you exercise?  0    How many  minutes per day do you exercise?  0    Total minutes per week of exercise  0      Patient Education   Previous Diabetes Education  No    Disease state   Definition of diabetes, type 1 and 2, and the diagnosis of diabetes;Factors that contribute to the development of diabetes;Explored patient's options for treatment of their diabetes    Nutrition management   Role of diet in the treatment of diabetes and the relationship between the three main macronutrients and blood glucose level;Food label reading, portion sizes and measuring food.;Carbohydrate counting;Reviewed blood glucose goals for pre and post meals and how to evaluate the patients' food intake on their blood glucose level.;Effects of alcohol on blood glucose and safety factors with consumption of alcohol.    Physical activity and exercise   Helped patient identify appropriate exercises in relation to his/her diabetes, diabetes complications and other health issue.    Monitoring  Identified appropriate SMBG and/or A1C goals.    Acute complications  Taught treatment of hypoglycemia - the 15 rule.      Individualized Goals (developed by patient)   Nutrition  Follow meal plan discussed    Physical Activity  Exercise 3-5 times per week   Arm Chair Exercises   Medications  take my medication as prescribed    Monitoring   test blood glucose pre and post meals as discussed      Post-Education Assessment   Patient understands incorporating nutritional management into lifestyle.  Demonstrates understanding / competency    Patient undertands incorporating physical activity into lifestyle.  Demonstrates understanding / competency    Patient understands using medications safely.  Demonstrates understanding / competency    Patient understands monitoring blood glucose, interpreting and using results  Demonstrates understanding / competency    Patient understands prevention, detection, and treatment of acute complications.  Demonstrates understanding  / competency    Patient understands prevention, detection, and treatment of chronic complications.  Demonstrates understanding / competency    Patient understands how to develop strategies to address psychosocial issues.  Demonstrates understanding / competency    Patient understands how to develop strategies to promote health/change behavior.  Demonstrates understanding / competency      Outcomes   Expected Outcomes  Demonstrated interest in learning. Expect positive outcomes    Future DMSE  PRN    Program Status  Not Completed       Individualized Plan for Diabetes Self-Management Training:   Learning Objective:  Patient will have a greater understanding of diabetes self-management. Patient education plan is to attend individual and/or group  sessions per assessed needs and concerns.   Plan:   Patient Instructions  Plan:  Aim for 3-4 Carb Choices per meal (45-60 grams)  Aim for 0-2 Carbs per snack if hungry  Include protein in moderation with your meals and snacks Consider reading food labels for Total Carbohydrate of foods Consider  increasing your activity level by arm chair exercises for 15-30 minutes daily as tolerated Consider checking BG at alternate times per day  Continue taking medication as directed by MD  Expected Outcomes:  Demonstrated interest in learning. Expect positive outcomes  Education material provided: Food label handouts, A1C conversion sheet, Meal plan card and Carbohydrate counting sheet  If problems or questions, patient to contact team via:  Phone  Future DSME appointment: PRN

## 2019-01-15 NOTE — Patient Instructions (Signed)
Plan:  Aim for 3-4 Carb Choices per meal (45-60 grams)  Aim for 0-2 Carbs per snack if hungry  Include protein in moderation with your meals and snacks Consider reading food labels for Total Carbohydrate of foods Consider  increasing your activity level by arm chair exercises for 15-30 minutes daily as tolerated Consider checking BG at alternate times per day  Continue taking medication as directed by MD

## 2019-01-16 ENCOUNTER — Ambulatory Visit (INDEPENDENT_AMBULATORY_CARE_PROVIDER_SITE_OTHER): Payer: Medicare Other | Admitting: Internal Medicine

## 2019-01-16 ENCOUNTER — Encounter: Payer: Self-pay | Admitting: Internal Medicine

## 2019-01-16 VITALS — Ht 68.0 in | Wt 215.0 lb

## 2019-01-16 DIAGNOSIS — F1010 Alcohol abuse, uncomplicated: Secondary | ICD-10-CM | POA: Diagnosis not present

## 2019-01-16 DIAGNOSIS — Z800 Family history of malignant neoplasm of digestive organs: Secondary | ICD-10-CM

## 2019-01-16 DIAGNOSIS — K760 Fatty (change of) liver, not elsewhere classified: Secondary | ICD-10-CM

## 2019-01-16 DIAGNOSIS — R748 Abnormal levels of other serum enzymes: Secondary | ICD-10-CM | POA: Diagnosis not present

## 2019-01-16 DIAGNOSIS — E782 Mixed hyperlipidemia: Secondary | ICD-10-CM

## 2019-01-16 DIAGNOSIS — Z7960 Long term (current) use of unspecified immunomodulators and immunosuppressants: Secondary | ICD-10-CM

## 2019-01-16 DIAGNOSIS — Z79899 Other long term (current) drug therapy: Secondary | ICD-10-CM

## 2019-01-16 NOTE — Progress Notes (Signed)
TELEHEALTH ENCOUNTER IN SETTING OF COVID-19 PANDEMIC - REQUESTED BY PATIENT SERVICE PROVIDED BY TELEMEDECINE - TYPE: [facility] AVPATIENT LOCATION: Home PATIENT HAS CONSENTED TO TELEHEALTH VISIT PROVIDER LOCATION: OFFICE REFERRING PROVIDER:Dr. Opalski PARTICIPANTS OTHER THAN PATIENT:None TIME SPENT ON CALL:12 mins 50 sec    Louis Carney 38 y.o. 10-09-1980 [id number] Referred by: Mellody Dance, DO  Assessment & Plan:   Encounter Diagnoses  Name Primary?  . Abnormal transaminases Yes  . Fatty liver   . Alcohol abuse   . Family history of colon cancer in father - late 9's   . Long-term use of immunosuppressant medication     Most likely cause of his problems would be excessive alcohol use.  The contribution of the Skyrizi, uncontrolled diabetes, not clear but certainly possible.  It does not seem that [name] is linked abnormal transaminases per my investigation however.  It looks like he had orders looking for inherited and acquired problems but did not complete those so I will have him come to my lab to recheck his liver tests and also do an autoimmune and infectious work-up.  Also inherited disease work-up.  He needs to stop drinking he has made strides and eliminated liquor and I want him to start cutting down on the excessive beer consumption.  Further plans pending these results.  He will be 39 next year and that would be a good time to have a colonoscopy given his father's history of colon cancer in his late 4s.  I appreciate the opportunity to care for this patient. CC: Mellody Dance, DO Jennifer Clark-Bruning PA-C  Subjective:   Chief Complaint: Abnormal liver tests  HPI Patient is a 38 year old white man with a history of psoriasis on biologic therapy, referred for abnormal transaminases which were discovered recently.  He was discovered to have diabetes out of control and new diagnosis and is been started on therapy.  AST 220 ALT 394 albumin 4.5, globulin  fraction 1.8, normal bilirubin normal alk phos.  November 06, 2018 ultrasound demonstrates fatty liver and some question of cirrhosis.  The patient is a regular user of alcohol he recently stopped "hard liquor" but drinks 8 beers approximately plus or minus a few every night for many years.  He is a disabled Ecologist, disabled due to back pain and he is about to have another surgery for that he says.  He is never had known liver problems though both of his parents have had alcohol issues.  Recently met his father or saw him for the first time in many years and was informed that his father is about to have surgery for colon cancer his father is in his late 63s.  The patient has not had any signs or symptoms of jaundice.  He has never been told he had liver problems before.  Triglycerides have been markedly elevated in the past.  They are now as well.  He has psoriasis for many years and has been on multiple agents including Enbrel and Cosentyx.  Followed by dermatology.  Now taking a once every 75-monthSkyrizi injection.(risankizumab).  He is a bit concerned about the possibility of being able to stay on this due to his abnormal liver tests.   He is on Nexium which controls heartburn  He is on Nexium which controls heartburn. GI review of systems appears to be negative otherwise.No Known Allergies Current Meds  Medication Sig  . blood glucose meter kit and supplies Dispense based on patient and insurance preference. Use to  check glucose level three times daily before meals and 3 times daily 2 hours after meals (6 times daily total). (FOR ICD-10 E10.9, E11.9).  Marland Kitchen esomeprazole (NEXIUM) 20 MG capsule Take 20 mg by mouth daily at 12 noon.  . gabapentin (NEURONTIN) 300 MG capsule Take 2 capsules by mouth 2 (two) times daily.  Marland Kitchen ibuprofen (ADVIL) 200 MG tablet Take 800 mg by mouth 2 (two) times daily.  . insulin aspart (NOVOLOG FLEXPEN) 100 UNIT/ML FlexPen Inject 10-14 Units into the skin 3  (three) times daily with meals.  . Insulin Glargine (LANTUS SOLOSTAR) 100 UNIT/ML Solostar Pen Inject 15 units into the skin twice daily-12 hours apart.  . Olmesartan-amLODIPine-HCTZ 40-10-25 MG TABS Take 1 tablet by mouth daily. 1QD  . Risankizumab-rzaa,150 MG Dose, (SKYRIZI, 150 MG DOSE,) 75 MG/0.83ML PSKT Inject 300 mg into the skin every 3 (three) months. 150 mg (1 injection) in each leg every 3 months  . sildenafil (VIAGRA) 100 MG tablet TAKE 1/2 (ONE-HALF) TO 1 TABLET BY MOUTH 30 MINUTES PRIOR TO INTERCOURSE AS NEEDED.   Past Medical History:  Diagnosis Date  . Arthritis   . Diabetes ([place])    type 1  . GERD (gastroesophageal reflux disease)   . Headache    migraines  . Hypertension   . Psoriasis    since age 38 years  . Tuberculosis    latent TB infection on treatment   Past Surgical History:  Procedure Laterality Date  . CARPAL TUNNEL RELEASE Right 01/15/2018   Procedure: Right Carpal tunnel release;  Surgeon: Ashok Pall, MD;  Location: [facility];  Service: Neurosurgery;  Laterality: Right;  Right Carpal tunnel release  . CERVICAL SPINE SURGERY    . LUMBAR LAMINECTOMY/DECOMPRESSION MICRODISCECTOMY Right 03/07/2018   Procedure: Right Lumbar 5 Sacral 1 Microdiscectomy;  Surgeon: Ashok Pall, MD;  Location: [facility];  Service: Neurosurgery;  Laterality: Right;  Right Lumbar 5 Sacral 1 Microdiscectomy  . 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   Review of Systems As per HPI.  All other review of systems appear negative.   Limited physical exam through the video link shows him to be a  well-developed well-nourished man with an appropriate mood and affect.  Eyes appear anicteric.

## 2019-01-16 NOTE — Patient Instructions (Signed)
As we discussed please come to the lab in the basement of my building at [address] next week and do blood work to further evaluate your abnormal liver tests.   I am looking to see if there is any evidence of current or prior infection with hepatitis A B or C, autoimmune diseases and possibly acquired or inherited problems that can cause liver problems.   I do think the most likely problem is the excessive alcohol consumption and please continue to work on reducing that.   Once I see the results I will let you know and we will arrange some follow-up.  I appreciate the opportunity to care for you.  Gatha Mayer, MD, [facility]

## 2019-01-19 ENCOUNTER — Ambulatory Visit: Payer: Medicare Other | Admitting: Internal Medicine

## 2019-01-20 ENCOUNTER — Ambulatory Visit (INDEPENDENT_AMBULATORY_CARE_PROVIDER_SITE_OTHER): Payer: Medicare Other | Admitting: Internal Medicine

## 2019-01-20 ENCOUNTER — Encounter: Payer: Self-pay | Admitting: Internal Medicine

## 2019-01-20 ENCOUNTER — Other Ambulatory Visit (INDEPENDENT_AMBULATORY_CARE_PROVIDER_SITE_OTHER): Payer: Medicare Other

## 2019-01-20 ENCOUNTER — Other Ambulatory Visit: Payer: Self-pay

## 2019-01-20 VITALS — BP 128/80 | HR 88 | Ht 68.0 in | Wt 225.0 lb

## 2019-01-20 DIAGNOSIS — Z794 Long term (current) use of insulin: Secondary | ICD-10-CM

## 2019-01-20 DIAGNOSIS — E781 Pure hyperglyceridemia: Secondary | ICD-10-CM

## 2019-01-20 DIAGNOSIS — R748 Abnormal levels of other serum enzymes: Secondary | ICD-10-CM

## 2019-01-20 DIAGNOSIS — E669 Obesity, unspecified: Secondary | ICD-10-CM

## 2019-01-20 DIAGNOSIS — E1165 Type 2 diabetes mellitus with hyperglycemia: Secondary | ICD-10-CM

## 2019-01-20 DIAGNOSIS — E66811 Obesity, class 1: Secondary | ICD-10-CM

## 2019-01-20 LAB — LIPID PANEL
Cholesterol: 267 mg/dL — ABNORMAL HIGH (ref 0–200)
HDL: 32.9 mg/dL — ABNORMAL LOW (ref >39.00)
NonHDL: 233.64
Total CHOL/HDL Ratio: 8
Triglycerides: 386 mg/dL — ABNORMAL HIGH (ref 0.0–149.0)
VLDL: 77.2 mg/dL — ABNORMAL HIGH (ref 0.0–40.0)

## 2019-01-20 LAB — HEPATIC FUNCTION PANEL
ALT: 102 U/L — ABNORMAL HIGH (ref 0–53)
AST: 119 U/L — ABNORMAL HIGH (ref 0–37)
Albumin: 4.4 g/dL (ref 3.5–5.2)
Alkaline Phosphatase: 50 U/L (ref 39–117)
Bilirubin, Direct: 0.1 mg/dL (ref 0.0–0.3)
Total Bilirubin: 0.7 mg/dL (ref 0.2–1.2)
Total Protein: 7.5 g/dL (ref 6.0–8.3)

## 2019-01-20 LAB — POCT GLYCOSYLATED HEMOGLOBIN (HGB A1C): Hemoglobin A1C: 6 % — AB (ref 4.0–5.6)

## 2019-01-20 LAB — LDL CHOLESTEROL, DIRECT: Direct LDL: 184 mg/dL

## 2019-01-20 LAB — FERRITIN: Ferritin: 177.6 ng/mL (ref 22.0–322.0)

## 2019-01-20 LAB — GLUCOSE, POCT (MANUAL RESULT ENTRY): POC Glucose: 141 mg/dl — AB (ref 70–99)

## 2019-01-20 NOTE — Progress Notes (Addendum)
Patient ID: Louis Carney, male   DOB: [birth year], 38 y.o.   MRN: [id number]   HPI: Louis Carney is a 38 y.o.-year-old male, initially referred by his PCP, Dr. Raliegh Scarlet, returning for follow-up for DM2, dx in 09/2018, insulin-dependent, uncontrolled, without long-term complications.  Since last visit, he reduced his alcohol drinking (stopped liquor).  He also saw the nutritionist. He quit snacks, starches, fast food, sodas. Sugars improved significantly.  Reviewed history: He had dizziness, blurry vision >> checked sugar with neighbor's meter >> CBG high (HI, then rechecked: 540).  He saw PCP >> HbA1c was high x2 >> added insulin.  He felt much better after adding insulin.  However, sugars were still extremely high at last visit so we added rapid acting insulin and I made specific suggestions about his diet and alcohol intake.  Reviewed HbA1c levels: Lab Results  Component Value Date   HGBA1C 9.9 (A) 10/27/2018   HGBA1C 10.2 (H) 10/27/2018   HGBA1C 5.6 03/06/2018   HGBA1C 5.3 08/26/2017   Pt is on a regimen of: - Lantus 25 units 2x a day - Novolog 12-14 units 15 min before lunch and dinner - added 10/2018  Pt checks his sugars 4 times a day: - am: 202-300 >> 105-132 - 2h after b'fast: n/c - before lunch: 300-340 >> <130 - 2h after lunch: n/c - before dinner: 340 >> 128-132 - 2h after dinner: n/c - bedtime: 340 >> 130s - nighttime: n/c Lowest sugar was 202 >> 105; unclear at which level he has hypoglycemia awareness. Highest sugar was 460 >> 141 (today).  Glucometer:ReliOn  Pt's meals are: - Breakfast: skips - Lunch: [place] sandwich  - Dinner: lasagna, hamburger, steak + potatoes, pasta - Snacks:cookies, chips. He does not like greens. At last visit he was drinking 4-6 shots of vodka, 6 pack beer EVERY night >> now ~5 Bud light beers a night.  -No CKD.  Last BUN/creatinine:  Lab Results  Component Value Date   BUN 8 11/06/2018   BUN 25 (H) 10/27/2018   CREATININE 0.79  11/06/2018   CREATININE 1.06 10/27/2018  On olmesartan.  -+ Significant hypertriglyceridemia and very low HDL; last set of lipids: Lab Results  Component Value Date   CHOL 342 (H) 10/27/2018   HDL 10 (L) 10/27/2018   LDLCALC Comment 10/27/2018   LDLDIRECT 61 10/27/2018   TRIG 3,032 (HH) 10/27/2018   CHOLHDL 34.2 (H) 10/27/2018  Off omega 3 FAs during the investigation for transaminitis.  He started to see Dr. Carlean Purl with [facility].  Comprehensive liver tests are pending -he will have them drawn this week.  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  Pt has FH of DM in father - type 2 DM. + significant alcoholism FH.  He is on Risankizumab in 04/2018 for psoriasis >> working. Prev. Embrel, Humira, etc.  ROS: Constitutional: + weight gain/+ weight loss, no fatigue, no subjective hyperthermia, no subjective hypothermia Eyes: no blurry vision, no xerophthalmia ENT: no sore throat, no nodules palpated in neck, no dysphagia, no odynophagia, no hoarseness Cardiovascular: no CP/no SOB/no palpitations/no leg swelling Respiratory: no cough/no SOB/no wheezing Gastrointestinal: no N/no V/no D/no C/no acid reflux Musculoskeletal: no muscle aches/no joint aches Skin: no rashes, no hair loss Neurological: no tremors/no numbness/no tingling/no dizziness  I reviewed pt's medications, allergies, PMH, social hx, family hx, and changes were documented in the history of present illness. Otherwise, unchanged  from my initial visit note.  Past Medical History:  Diagnosis Date  . Arthritis   . Diabetes ([place])    type 1  . GERD (gastroesophageal reflux disease)   . Headache    migraines  . Hypertension   . Psoriasis    since age 53 years  . Tuberculosis    latent TB infection on treatment   Past Surgical History:  Procedure Laterality Date  . CARPAL TUNNEL RELEASE Right 01/15/2018   Procedure: Right Carpal tunnel  release;  Surgeon: Ashok Pall, MD;  Location: [facility];  Service: Neurosurgery;  Laterality: Right;  Right Carpal tunnel release  . CERVICAL SPINE SURGERY    . LUMBAR LAMINECTOMY/DECOMPRESSION MICRODISCECTOMY Right 03/07/2018   Procedure: Right Lumbar 5 Sacral 1 Microdiscectomy;  Surgeon: Ashok Pall, MD;  Location: [facility];  Service: Neurosurgery;  Laterality: Right;  Right Lumbar 5 Sacral 1 Microdiscectomy  . NECK SURGERY     Social History   Socioeconomic History  . Marital status: Married    Spouse name: Not on file  . Number of children: 5  . Years of education: Not on file  . Highest education level: Not on file  Occupational History  . Occupation: disabled  Social Needs  . Financial resource strain: Not on file  . Food insecurity    Worry: Not on file    Inability: Not on file  . Transportation needs    Medical: Not on file    Non-medical: Not on file  Tobacco Use  . Smoking status: Current Every Day Smoker    Packs/day: 1.00    Years: 15.00    Pack years: 15.00    Types: Cigarettes  . Smokeless tobacco: Never Used  Substance and Sexual Activity  . Alcohol use: Yes    Alcohol/week: 24.0 standard drinks    Types: 24 Standard drinks or equivalent per week  . Drug use: Never  . Sexual activity: Yes    Partners: Female    Birth control/protection: None  Lifestyle  . Physical activity    Days per week: Not on file    Minutes per session: Not on file  . Stress: Not on file  Relationships  . Social Herbalist on phone: Not on file    Gets together: Not on file    Attends religious service: Not on file    Active member of club or organization: Not on file    Attends meetings of clubs or organizations: Not on file    Relationship status: Not on file  . Intimate partner violence    Fear of current or ex partner: Not on file    Emotionally abused: Not on file    Physically abused: Not on file    Forced sexual activity: Not on file  Other Topics Concern   . 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 blood glucose meter kit and supplies Dispense based on patient and insurance preference. Use to check glucose level three times daily before meals and 3 times daily 2 hours after meals (6 times daily total). (FOR ICD-10 E10.9, E11.9). 1 each 0  . esomeprazole ([place])  20 MG capsule Take 20 mg by mouth daily at 12 noon.    . gabapentin (NEURONTIN) 300 MG capsule Take 2 capsules by mouth 2 (two) times daily.    Marland Kitchen ibuprofen (ADVIL) 200 MG tablet Take 800 mg by mouth 2 (two) times daily.    . insulin aspart (NOVOLOG FLEXPEN) 100 UNIT/ML FlexPen Inject 10-14 Units into the skin 3 (three) times daily with meals. 15 mL 11  . Insulin Glargine (LANTUS SOLOSTAR) 100 UNIT/ML Solostar Pen Inject 15 units into the skin twice daily-12 hours apart. 5 pen PRN  . Olmesartan-amLODIPine-HCTZ 40-10-25 MG TABS Take 1 tablet by mouth daily. 1QD 90 tablet 0  . Risankizumab-rzaa,150 MG Dose, (SKYRIZI, 150 MG DOSE,) 75 MG/0.83ML PSKT Inject 300 mg into the skin every 3 (three) months. 150 mg (1 injection) in each leg every 3 months    . sildenafil (VIAGRA) 100 MG tablet TAKE 1/2 (ONE-HALF) TO 1 TABLET BY MOUTH 30 MINUTES PRIOR TO INTERCOURSE AS NEEDED. 30 tablet 0   No current facility-administered medications on file prior to visit.    No Known Allergies Family History  Problem Relation Age of Onset  . Alcohol abuse Mother   . Hypertension Mother   . Lung cancer Mother   . Alcohol abuse Father   . Hypertension Father   . Colon cancer Father        dx in his late 1's  . Esophageal cancer Neg Hx   . Rectal cancer Neg Hx     PE: BP 128/80   Pulse 88   Ht _0   (1.727 m)   Wt 225 lb (102.1 kg)   SpO2 96%   BMI 34.21 kg/m  Wt Readings from Last 3 Encounters:  01/20/19 225 lb (102.1 kg)  01/16/19 215 lb (97.5 kg)  11/06/18 221 lb (100.2 kg)   Constitutional: overweight, in NAD Eyes: PERRLA, EOMI, no exophthalmos ENT: moist mucous membranes, no thyromegaly, no cervical lymphadenopathy Cardiovascular: RRR, No MRG Respiratory: CTA B Gastrointestinal: abdomen soft, NT, ND, BS+ Musculoskeletal: no deformities, strength intact in all 4 Skin: moist, warm, no rashes Neurological: no tremor with outstretched hands, DTR normal in all 4  ASSESSMENT: 1. DM, insulin-dependent, with recent diagnosis  2. HTG  3.  Obesity  PLAN:  1. Patient with a recently diagnosed diabetes, insulin-dependent.  At last visit, he was on Lantus blood sugars were still in the 300-400 range.  We discussed about the absolute need to improve his diet and I suggested specific changes.  I also referred him to nutrition and he recently had this appointment.  Since last visit, he improved his diet significantly by cutting out fast foods, starches, and snacks.  We also discussed at last visit about the absolute need to cut down and then ultimately stop alcohol.  This is difficult for him to do since his wife is also drinking liquor along with him.  He was able to stop liquor but he is still drinking several light beers a night.  At last visit we also added NovoLog before every meal.  He is using this diligently.  His sugars improved significantly.  No low blood sugars. -We will not change his regimen for now, but I am hoping we can stop at least NovoLog in the near future -At this visit, we will check him for type 1 diabetes - I suggested to:  Patient Instructions  Please continue: - Lantus 25 units 2x a day - Novolog 10-14 units 15 min before lunch and  dinner  Please stop at the lab.  Please return in 3 months with your sugar log.   - we checked his HbA1c: 6.0% [facility]  [facility]!) - advised to check sugars at different times of the day - 2-3x a day, rotating check times - advised for yearly eye exams >> he is not UTD but coming up at the end of the month - return to clinic in 3-4 months  2. HTG - Reviewed latest lipid panel from 09/2018: Triglycerides in the pancreatitis range, HDL very low, LDL at goal Lab Results  Component Value Date   CHOL 342 (H) 10/27/2018   HDL 10 (L) 10/27/2018   LDLCALC Comment 10/27/2018   LDLDIRECT 61 10/27/2018   TRIG 3,032 ([place]) 10/27/2018   CHOLHDL 34.2 (H) 10/27/2018  -Previously on omega-3 fatty acids but now off during investigation of his liver dysfunction - Intensifying his insulin treatment and stopping alcohol will definitely help >> we again discussed that he absolutely needs to stop alcohol to avoid pancreatitis -We will recheck his lipid panel today.  This morning, he had a banana and peanut butter.  3.  Obesity -She had just this is diet by cutting out starches, fast food and snacks -He initially lost a little weight but gained 10 pounds recently  . Cholesterol 01/20/2019 267* 0 - 200 mg/dL Final   ATP III Classification       Desirable:  < 200 mg/dL               Borderline High:  200 - 239 mg/dL          High:  > = 240 mg/dL  . Triglycerides 01/20/2019 386.0* 0.0 - 149.0 mg/dL Final   Normal:  <150 mg/dLBorderline High:  150 - 199 mg/dL  . HDL 01/20/2019 32.90* >39.00 mg/dL Final  . VLDL 01/20/2019 77.2* 0.0 - 40.0 mg/dL Final  . Total CHOL/HDL Ratio 01/20/2019 8   Final                  Men          Women1/2 Average Risk     3.4          3.3Average Risk          5.0          4.42X Average Risk          9.6          7.13X Average Risk          15.0          11.0                      . NonHDL 01/20/2019 233.64   Final   NOTE:  Non-HDL goal should be 30 mg/dL higher than patient's LDL goal (i.e. LDL goal of < 70 mg/dL, would have non-HDL goal of < 100 mg/dL)  . Direct LDL 01/20/2019 184.0  mg/dL Final    Optimal:  <100 mg/dLNear or Above Optimal:  100-129 mg/dLBorderline High:  130-159 mg/dLHigh:  160-189 mg/dLVery High:  >190 mg/dL   Msg sent: Dear Mr. Leider, I am sorry for the delay in getting you the results.  One of the pancreas antibody results are not back.  I will send this to you as soon as it returns.  However, as of now, relapse point towards type II, rather than type 1 diabetes, which is good news.  Your insulin production appears robust. Regarding the  rest of the tests: Your triglycerides level is high, but it decreased dramatically since 3 months ago, which is excellent news!  However, LDL cholesterol is quite high, at 184.  This is most likely due to the conversion of the triglycerides.  I would recommend a statin, but not before your liver investigation is complete. Sincerely, Philemon Kingdom MD  Component     Latest Ref Rng & Units 01/20/2019          POC Glucose     70 - 99 mg/dl 141 (A)  Glucose, Plasma     65 - 99 mg/dL 111 (H)  C-Peptide     0.80 - 3.85 ng/mL 2.43  Islet Cell Ab     Neg:<1:1 Negative  ZNT8 Antibodies     U/mL <15  Glutamic Acid Decarb Ab     <5 IU/mL <5     Philemon Kingdom, MD PhD [facility] Endocrinology

## 2019-01-20 NOTE — Patient Instructions (Signed)
Please continue: - Lantus 25 units 2x a day - Novolog 10-14 units 15 min before lunch and dinner  Please stop at the lab.  Please return in 3 months with your sugar log.

## 2019-01-23 LAB — GLUCOSE, FASTING: Glucose, Plasma: 111 mg/dL — ABNORMAL HIGH (ref 65–99)

## 2019-01-23 LAB — HEPATITIS C ANTIBODY
Hepatitis C Ab: NONREACTIVE
SIGNAL TO CUT-OFF: 0.01 (ref <1.00)

## 2019-01-23 LAB — HEPATITIS B CORE ANTIBODY, TOTAL: Hep B Core Total Ab: NONREACTIVE

## 2019-01-23 LAB — CERULOPLASMIN: Ceruloplasmin: 38 mg/dL — ABNORMAL HIGH (ref 18–36)

## 2019-01-23 LAB — MITOCHONDRIAL ANTIBODIES: Mitochondrial M2 Ab, IgG: <=20 U

## 2019-01-23 LAB — GLUTAMIC ACID DECARBOXYLASE AUTO ABS: Glutamic Acid Decarb Ab: <5 IU/mL (ref <5)

## 2019-01-23 LAB — HEPATITIS B SURFACE ANTIBODY,QUALITATIVE: Hep B S Ab: NONREACTIVE

## 2019-01-23 LAB — HEPATITIS B SURFACE ANTIGEN: Hepatitis B Surface Ag: NONREACTIVE

## 2019-01-23 LAB — C-PEPTIDE: C-Peptide: 2.43 ng/mL (ref 0.80–3.85)

## 2019-01-23 LAB — ANTI-SMOOTH MUSCLE ANTIBODY, IGG: Actin (Smooth Muscle) Antibody (IGG): <20 U (ref <20)

## 2019-01-23 LAB — ANA: Anti Nuclear Antibody (ANA): NEGATIVE

## 2019-01-23 LAB — ALPHA-1-ANTITRYPSIN: A-1 Antitrypsin, Ser: 168 mg/dL (ref 83–199)

## 2019-01-23 LAB — HEPATITIS A ANTIBODY, TOTAL: Hepatitis A AB,Total: NONREACTIVE

## 2019-01-23 NOTE — Progress Notes (Signed)
Only one test remaining but it is looking like alcohol is the problem as we thought.  Keep working on reducing and eliminating alcohol.  If you are willing we can refer you to some programs that can help you with this.  I think that would be more effective than trying on your own.  It is critical for your health that you stop alcohol.  Let me and/or Dr. Willy Eddy know.  Best regards,  Gatha Mayer, MD, [facility]

## 2019-01-27 NOTE — Progress Notes (Signed)
All tests negative so conclusion is that alcohol is the problem Please set up f/u visit me for early November - approx

## 2019-01-28 DIAGNOSIS — E119 Type 2 diabetes mellitus without complications: Secondary | ICD-10-CM | POA: Diagnosis not present

## 2019-01-28 LAB — HM DIABETES EYE EXAM

## 2019-01-29 ENCOUNTER — Telehealth: Payer: Self-pay

## 2019-01-29 NOTE — Telephone Encounter (Signed)
Patient qualifies for a CGM under Medicare, it was sent in to a supplier, patient is unable to afford the 20% co-insurance not covered by Medicare so he is declining at this time.

## 2019-02-01 LAB — ZNT8 ANTIBODIES: ZNT8 Antibodies: <15 U/mL

## 2019-02-01 LAB — ANTI-ISLET CELL ANTIBODY: Islet Cell Ab: NEGATIVE

## 2019-02-05 ENCOUNTER — Other Ambulatory Visit: Payer: Self-pay

## 2019-02-05 ENCOUNTER — Ambulatory Visit (INDEPENDENT_AMBULATORY_CARE_PROVIDER_SITE_OTHER): Payer: Medicare Other | Admitting: Family Medicine

## 2019-02-05 ENCOUNTER — Encounter: Payer: Self-pay | Admitting: Family Medicine

## 2019-02-05 VITALS — BP 176/109 | HR 101 | Ht 68.0 in | Wt 231.0 lb

## 2019-02-05 DIAGNOSIS — F4323 Adjustment disorder with mixed anxiety and depressed mood: Secondary | ICD-10-CM

## 2019-02-05 DIAGNOSIS — I152 Hypertension secondary to endocrine disorders: Secondary | ICD-10-CM

## 2019-02-05 DIAGNOSIS — K759 Inflammatory liver disease, unspecified: Secondary | ICD-10-CM | POA: Insufficient documentation

## 2019-02-05 DIAGNOSIS — Z789 Other specified health status: Secondary | ICD-10-CM

## 2019-02-05 DIAGNOSIS — IMO0002 Reserved for concepts with insufficient information to code with codable children: Secondary | ICD-10-CM

## 2019-02-05 DIAGNOSIS — E1159 Type 2 diabetes mellitus with other circulatory complications: Secondary | ICD-10-CM

## 2019-02-05 DIAGNOSIS — E785 Hyperlipidemia, unspecified: Secondary | ICD-10-CM

## 2019-02-05 DIAGNOSIS — I10 Essential (primary) hypertension: Secondary | ICD-10-CM

## 2019-02-05 DIAGNOSIS — E1065 Type 1 diabetes mellitus with hyperglycemia: Secondary | ICD-10-CM | POA: Diagnosis not present

## 2019-02-05 DIAGNOSIS — E1169 Type 2 diabetes mellitus with other specified complication: Secondary | ICD-10-CM | POA: Diagnosis not present

## 2019-02-05 DIAGNOSIS — F1019 Alcohol abuse with unspecified alcohol-induced disorder: Secondary | ICD-10-CM

## 2019-02-05 NOTE — Progress Notes (Signed)
Impression and Recommendations:    1. New onset type 1 diabetes mellitus, uncontrolled ([place])   2. Hypertension associated with diabetes ([place])   3. Hyperlipidemia associated with type 2 diabetes mellitus ([place])   4. Alcohol abuse with alcohol-induced disorder ([place])   5. Drinks beer- 6-8 beers/d on ave ( 10 Yrs or so)   6. Hepatitis   7. Adjustment disorder with mixed anxiety and depressed mood     New Onset Type 1 Diabetes Mellitus, Uncontrolled - Followed by Dr. Letta Median of Endocrinology. - Per pt, A1c down to 6.0 now.  - Advised patient to continue management as established by endocrinology. - Patient tolerating treatment plan well.   - Will continue to monitor in addition to Endo doc   Adjustment Disorder w/ Mixed Anxiety & Depressed Mood - Prescription intervention declined today. - Discussed importance of mood management with patient today. - Patient agrees to ask if he desires assistance in the future. -Recommend patient seek counseling which he possibly agrees to today - REFERRAL to psychology placed to begin counseling -Recommend he discuss with wife going to couples counseling since a lot of his stress is her/ their relationship - Will continue to monitor.   Hypertension associated with DM - Blood pressure elevated in office today. - Reviewed goal blood pressure with patient today.  - Patient will continue current treatment regimen for now.  See med list.  - Counseled patient on pathophysiology of disease and discussed various treatment options, which always includes dietary and lifestyle modification as first line.   - Lifestyle changes such as dash and heart healthy diets and engaging in a regular exercise program discussed extensively with patient.   - Ambulatory blood pressure monitoring encouraged at least 3 times weekly.  Keep log and bring in every office visit.  Reminded patient that if they ever feel poorly in any way, to check their blood pressure  and pulse.  - Handouts provided at patient's desire and/or told to go online at the [facility] website for further information  - We will continue to monitor.    Hyperlipidemia, Hypertriglyceridemia - Cholesterol elevated at this time.  Triglycerides = 386.0, down from 3,032 prior. HDL = still low, but improved to 32.90 from 10 last check. LDL = increased to 184 from 146 six months ago.  - Cholesterol levels remain elevated. - Recommended resuming management on statin.  However, given elevated AST and ALT enzymes, discussed that patient cannot begin statin again until he has eliminated use of alcohol and liver enzymes normalized.  - Ongoing prudent dietary changes such as low saturated & trans fat diets for hyperlipidemia and low carb/ ketogenic diets for hypertriglyceridemia discussed with patient.    - Encouraged patient to follow AHA guidelines for regular exercise and also engage in weight loss if BMI above 25.   - Educational handouts provided at patient's desire and/ or told to look online at the [facility] website for further information.  - We will continue to monitor    Drinks Beer - 6-8 beers per day on average (10 years or so) - Per patient, has reduced intake to "couple light beers every now and then." - On further questioning, pt confirms continues to drink around 6-8 beers per day, more on wkends.  - Discussed importance of eliminating alcohol entirely.  - Very extensive discussion had with pt regarding his risks associated with his alcoholism.  - Strongly advised stopping all use of alcohol.  Explained to pt he will have MMP from his excess ETOH, early death etc    Elevated Liver Enzymes, Hepatitis - NEEDS Alcohol Cessation - Last Hepatic Function Panel: Elevated AST and ALT. - AST improved to 119 from 220 prior. - ALT improved to 102 from 394 prior.  - Reviewed results of recent ultrasound with patient today. - Discussed  that labs and imaging may indicate early cirrhosis of the liver from alcohol use. - Pt currently followed by Dr. Carlean Purl of GI.  - Extensive education provided to patient today.  All questions answered.  - Goal: one month without drinking any alcohol. - Re-check hepatic function after one month without alcohol.  - Handouts provided today regarding support for alcohol cessation.  - Will continue to monitor.    BMI Counseling - Body mass index is 35.12 kg/m Explained to patient what BMI refers to, and what it means medically.    Told patient to think about it as a "medical risk stratification measurement" and how increasing BMI is associated with increasing risk/ or worsening state of various diseases such as hypertension, hyperlipidemia, diabetes, premature OA, depression etc.  American Heart Association guidelines for healthy diet, basically Mediterranean diet, and exercise guidelines of 30 minutes 5 days per week or more discussed in detail.  Health counseling performed.  All questions answered.   Lifestyle & Preventative Health Maintenance - Advised patient to continue working toward exercising to improve overall mental, physical, and emotional health.    - Reviewed the "spokes of the wheel" of mood and health management.  Stressed the importance of ongoing prudent habits, including regular exercise, appropriate sleep hygiene, healthful dietary habits, and prayer/meditation to relax.  - Encouraged patient to engage in daily physical activity, especially a formal exercise routine.  Recommended that the patient eventually strive for at least 150 minutes of moderate cardiovascular activity per week according to guidelines established by the [facility].   - Healthy dietary habits encouraged, including low-carb, and high amounts of lean protein in diet.   - Patient should also consume adequate amounts of water.    Education and routine counseling performed. Handouts provided.   Recommendations - Blood work last obtained on 01/20/2019. - Patient knows to call in with any acute concerns or desire to address mood management.    Orders Placed This Encounter  Procedures  . Hepatic function panel  . Ambulatory referral to Psychology   The patient was counseled, risk factors were discussed, anticipatory guidance given.  Gross side effects, risk and benefits, and alternatives of medications discussed with patient.  Patient is aware that all medications have potential side effects and we are unable to predict every side effect or drug-drug interaction that may occur.  Expresses verbal understanding and consents to current therapy plan and treatment regimen.   Return for one month of sobriety from alcohol- obtain lab only-Hepatitis panel; o/w 71mof/up.   Please see AVS handed out to patient at the end of our visit for further patient instructions/ counseling done pertaining to today's office visit.    Note:  This document was prepared using Dragon voice recognition software and may include unintentional dictation errors.   This document serves as a record of services personally performed by DMellody Dance DO. It was created on her behalf by KToni Amend a trained medical scribe. The creation of this record is based on the scribe's personal observations and the provider's statements to them.   I have reviewed the above medical documentation for accuracy and  completeness and I concur.  Mellody Dance, DO 02/05/2019 5:32 PM      Subjective:    Chief Complaint  Patient presents with  . Hypertension  . Diabetes     Louis Carney is a 38 y.o. male who presents to [place] [facility] today for Diabetes Management.    Has been to several specialists; notes "did the ultrasound."  Notes his wife "can't believe it," and his child states "me neither, actually!"  - [place] Notes was unable to establish with psychiatry due  to lack of coverage.  Notes "doesn't want to see another doctor for something he can handle himself."  - Chronic Heavy Alcohol Use; Elevated Liver Enzymes Notes drinking "a couple light beers here and there, but not like I used to, not every night." Confirms he continues to drink about 6-8 beers per night.  Per patient, has been to GI for assessment.  Says "I can stop [drinking]; it's not like it's an issue."  Says "if I eat something, there's no way I'll drink."  Notes he will begin to eat at 6 PM instead of drinking, because "I can't eat and drink."  States has been to AA, has been to church classes about it, has been to anger management, "I've done it all" in the past.  DM HPI: Followed by Endocrinology -  He has been working on diet and exercise for diabetes. His A1c is down to 6.0 now and notes endocrinology "wanted to make sure I still needed the insulin.  Notes not drinking liquor and "no more bread."  Pt is currently maintained on the following medications for diabetes:   see med list today Medication compliance - continues on insulin as managed by endo.  Home glucose readings range: says he's pricking his fingers constantly.   Denies polyuria/polydipsia. Denies hypo/ hyperglycemia symptoms - He denies new onset of: chest pain, exercise intolerance, shortness of breath, dizziness, visual changes, headache, lower extremity swelling or claudication.   Last diabetic eye exam was No results found for: HMDIABEYEEXA  Foot exam- UTD  Last A1C in the office was:  Lab Results  Component Value Date   HGBA1C 6.0 (A) 01/20/2019   HGBA1C 9.9 (A) 10/27/2018   HGBA1C 10.2 (H) 10/27/2018    Lab Results  Component Value Date   [place] Comment 10/27/2018   CREATININE 0.79 11/06/2018    HPI:  Hypertension:  -  His blood pressure at home has been running: 120-something over 80-something.  At home running 130/88 typically, and not running anywhere near 140/90 per patient.  Notes  "good some days and not others."  Says "bottom number hasn't been 90, and it hasn't been over 130 or 140."  - Patient reports good compliance with medication and/or lifestyle modification  - His denies acute concerns or problems related to treatment plan  - He denies new onset of: chest pain, exercise intolerance, shortness of breath, dizziness, visual changes, headache, lower extremity swelling or claudication.   Last 3 blood pressure readings in our office are as follows: BP Readings from Last 3 Encounters:  02/05/19 (!) 176/109  01/20/19 128/80  11/06/18 (!) 150/80   Filed Weights   02/05/19 1343  Weight: 231 lb (104.8 kg)       Last 3 blood pressure readings in our office are as follows: BP Readings from Last 3 Encounters:  02/05/19 (!) 176/109  01/20/19 128/80  11/06/18 (!) 150/80    BMI Readings from Last 3 Encounters:  02/05/19 35.12 kg/m  01/20/19 34.21 kg/m  01/16/19 32.69 kg/m     Problem  Alcohol Abuse With Alcohol-Induced Disorder (Hcc)  Adjustment Disorder With Mixed Anxiety and Depressed Mood  Hypertriglyceridemia  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       Patient Care Team    Relationship Specialty Notifications Start End  Mellody Dance, DO PCP - General Family Medicine  08/26/17   Arlyss Gandy, PA-C  Dermatology  07/31/18    Comment: Rocco Serene, MD Consulting Physician Neurosurgery  07/31/18   Thayer Headings, MD Consulting Physician Infectious Diseases  07/31/18   Philemon Kingdom, MD Consulting Physician Endocrinology   12/25/18      Patient Active Problem List   Diagnosis Date Noted  . Alcohol abuse with alcohol-induced disorder ([place]) 02/07/2019    Priority: High  . New onset type 1 diabetes mellitus, uncontrolled ([place]) 11/07/2018    Priority: High  . Hyperlipidemia associated with type 2 diabetes mellitus ([place]) 09/12/2017    Priority: High  . Cigarette smoker- 3 ppd for 6 yrs, then 1ppd for 9 yrs- 27 pk yr hx 08/26/2017    Priority: High  . Hypertension associated with diabetes ([place]) 08/26/2017    Priority: High  . Adjustment disorder with mixed anxiety and depressed mood 02/05/2019    Priority: Medium  . Hypertriglyceridemia 09/12/2017    Priority: Medium  . 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 RSD (reflex sympathetic dystrophy)- right entire leg painful to non-noxious stimuli 08/26/2017    Priority: Medium  . Drinks beer- 6-8 beers/d on ave ( 10 Yrs or so) 08/26/2017    Priority: Medium  . NSAID long-term use-4 ibuprofen 3 times daily times at least 5 years 08/26/2017    Priority: Medium  . 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 Mood disorder ([place])- secondary to chronic pain syndrome 08/26/2017    Priority: Medium  . Insomnia 08/26/2017    Priority: Medium  . Elevated liver function tests 11/07/2018    Priority: Low  . Family history of diabetes mellitus in father 09/12/2017    Priority: Low  . Vitamin D deficiency 09/12/2017    Priority: Low  . Psoriasis-onset age 30 has needed Cosentyx in past has failed Enbrel and Humira 08/26/2017    Priority: Low  . ED (erectile dysfunction) 08/26/2017    Priority: Low  . Hepatitis 02/05/2019  . Hepatocellular damage 11/07/2018  . Nausea 11/07/2018  . Alcohol abuse 09/09/2018  . HNP (herniated nucleus pulposus), lumbar 03/07/2018  . Transaminitis 01/29/2018  . Medication monitoring encounter  11/29/2017  . TB lung, latent 11/06/2017  . High risk medication use 09/12/2017  . Low level of high density lipoprotein (HDL) 09/12/2017  . Chronic radicular low back pain- R sided 08/26/2017  . History of excision of lamina of cervical vertebra for decompression of sp- C4-6inal cord 08/26/2017  . Chronic fatigue 08/26/2017  . Environmental and seasonal allergies 08/26/2017  . Allergic conjunctivitis of both eyes and rhinitis  08/26/2017  . Class 1 obesity 08/26/2017  . Plaque psoriasis 08/26/2017     Past Medical History:  Diagnosis Date  . Arthritis   . Diabetes ([place])    type 1  . GERD (gastroesophageal reflux disease)   . Headache    migraines  . Hypertension   . Psoriasis    since age 78 years  . Tuberculosis    latent TB infection on treatment     Past Surgical History:  Procedure Laterality Date  . CARPAL TUNNEL RELEASE Right 01/15/2018   Procedure: Right Carpal tunnel release;  Surgeon: Ashok Pall, MD;  Location: [facility];  Service: Neurosurgery;  Laterality: Right;  Right Carpal tunnel release  . CERVICAL SPINE SURGERY    . LUMBAR LAMINECTOMY/DECOMPRESSION MICRODISCECTOMY Right 03/07/2018   Procedure: Right Lumbar 5 Sacral 1 Microdiscectomy;  Surgeon: Ashok Pall, MD;  Location: [facility];  Service: Neurosurgery;  Laterality: Right;  Right Lumbar 5 Sacral 1 Microdiscectomy  . NECK SURGERY       Family History  Problem Relation Age of Onset  . Alcohol abuse Mother   . Hypertension Mother   . Lung cancer Mother   . Alcohol abuse Father   . Hypertension Father   . Colon cancer Father        dx in his late 25's  . Esophageal cancer Neg Hx   . Rectal cancer Neg Hx      Social History   Substance and Sexual Activity  Drug Use Never  ,  Social History   Substance and Sexual Activity  Alcohol Use Yes  . Alcohol/week: 24.0 standard drinks  . Types: 24 Standard drinks or equivalent per week  ,  Social History   Tobacco Use  Smoking Status Current Every  Day Smoker  . Packs/day: 1.00  . Years: 15.00  . Pack years: 15.00  . Types: Cigarettes  Smokeless Tobacco Never Used  ,    Current Outpatient Medications on File Prior to Visit  Medication Sig Dispense Refill  . blood glucose meter kit and supplies Dispense based on patient and insurance preference. Use to check glucose level three times daily before meals and 3 times daily 2 hours after meals (6 times daily total). (FOR ICD-10 E10.9, E11.9). 1 each 0  . esomeprazole (NEXIUM) 20 MG capsule Take 20 mg by mouth daily at 12 noon.    . gabapentin (NEURONTIN) 300 MG capsule Take 2 capsules by mouth 2 (two) times daily.    Marland Kitchen ibuprofen (ADVIL) 200 MG tablet Take 800 mg by mouth 2 (two) times daily.    . insulin aspart (NOVOLOG FLEXPEN) 100 UNIT/ML FlexPen Inject 10-14 Units into the skin 3 (three) times daily with meals. 15 mL 11  . Insulin Glargine (LANTUS SOLOSTAR) 100 UNIT/ML Solostar Pen Inject 15 units into the skin twice daily-12 hours apart. 5 pen PRN  . Olmesartan-amLODIPine-HCTZ 40-10-25 MG TABS Take 1 tablet by mouth daily. 1QD 90 tablet 0  . Risankizumab-rzaa,150 MG Dose, (SKYRIZI, 150 MG DOSE,) 75 MG/0.83ML PSKT Inject 300 mg into the skin every 3 (three) months. 150 mg (1 injection) in each leg every 3 months    . sildenafil (VIAGRA) 100 MG tablet TAKE 1/2 (ONE-HALF) TO 1 TABLET BY MOUTH 30 MINUTES PRIOR TO INTERCOURSE AS NEEDED. 30 tablet 0   No current facility-administered medications on file prior to visit.      No Known Allergies   Review of Systems:   General:  Denies fever, chills Optho/Auditory:   Denies  visual changes, blurred vision Respiratory:   Denies SOB, cough, wheeze, DIB  Cardiovascular:   Denies chest pain, palpitations, painful respirations Gastrointestinal:   Denies nausea, vomiting, diarrhea.  Endocrine:     Denies new hot or cold intolerance Musculoskeletal:  Denies joint swelling, gait issues, or new unexplained myalgias/ arthralgias Skin:  Denies  rash, suspicious lesions  Neurological:    Denies dizziness, unexplained weakness, numbness  Psychiatric/Behavioral:   Denies mood changes    Objective:     Blood pressure (!) 176/109, pulse (!) 101, height '5\' 8"'  (1.727 m), weight 231 lb (104.8 kg), SpO2 97 %.  Body mass index is 35.12 kg/m.  General: Well Developed, well nourished, and in no acute distress.  HEENT: Normocephalic, atraumatic, pupils equal round reactive to light, neck supple, No carotid bruits, no JVD Skin: Warm and dry, cap RF less 2 sec Cardiac: Regular rate and rhythm, S1, S2 WNL's, no murmurs rubs or gallops Respiratory: ECTA B/L, Not using accessory muscles, speaking in full sentences. NeuroM-Sk: Ambulates w/o assistance, moves ext * 4 w/o difficulty, sensation grossly intact.  Ext: scant edema b/l lower ext Psych: No HI/SI, judgement and insight good, Euthymic mood. Full Affect.

## 2019-02-05 NOTE — Patient Instructions (Signed)
Alcoholic Hepatitis  Alcoholic hepatitis is liver inflammation that is caused by drinking a lot of alcohol over a long period of time. This inflammation decreases the liver's ability to function normally. This condition requires you to stop drinking alcohol permanently to prevent further damage. What are the causes? Alcoholic hepatitis is caused by long-term (chronic) heavy alcohol use. The liver filters alcohol out of the bloodstream. When alcohol gets divided into small particles (broken down) in the liver, substances are produced that can damage liver cells. This causes destruction of liver cells and inflammation. What increases the risk? The following factors may make you more likely to develop this condition:  Regularly drinking large amounts of alcohol, especially in a short amount of time (binge drinking).  Drinking heavily for years.  Being male.  Being obese.  Having had a hepatitis infection in the past.  Having a liver problem that you were born with (genetic liver disease).  Having a lack (deficiency) of certain nutrients, such as folate or thiamine.  Having a parent or sibling who has alcoholic hepatitis. What are the signs or symptoms? Symptoms of this condition include:  Pain and swelling in the abdomen.  Loss of appetite.  Losing weight without trying.  Nausea and vomiting.  Diarrhea.  Fever.  Fatigue.  Yellowing of the skin and the whites of the eyes (jaundice).  Veins that you can see ("spider veins"), especially in the abdomen.  Bleeding easily, such as excessive bleeding from a minor cut.  Itching.  Trouble thinking clearly.  Memory problems.  Mood changes.  Confusion. How is this diagnosed? This condition may be diagnosed with:  A physical exam and a review of medical history.  Blood tests to check liver function.  Tests that create detailed images of the body. These may include: ? A liver ultrasound. ? CT scan. ? MRI.  A  liver biopsy. For this test, a small sample of liver tissue is removed and checked for signs of liver damage. How is this treated? The most important part of treatment is to stop drinking alcohol. If you are addicted to alcohol, your health care provider will help you make a plan to quit. This plan may involve:  Taking medicine to decrease unpleasant symptoms that are caused by stopping or decreasing alcohol use (withdrawal symptoms).  Entering a treatment program to help you stop drinking.  Joining a support group. Treatment for alcoholic hepatitis may also include:  Steroid medicines to reduce inflammation.  Nutritional therapy. Your health care provider or a diet and nutrition specialist (dietitian) may recommend: ? Eating a healthy diet. ? Eating specific foods that contain vitamins and minerals to help you maintain nutrient levels in your body. ? Taking vitamins and dietary supplements to make sure you maintain nutrient levels in your body.  Receiving a donated liver (liver transplant). This is only done in very severe cases, and only for people who have completely stopped drinking and can commit to never drinking alcohol again. Follow these instructions at home:   Do not drink alcohol. Follow your treatment plan, and work with your health care provider as needed.  Consider joining an alcohol support group. These groups can provide emotional support and guidance.  Take over-the-counter and prescription medicines only as told by your health care provider. These include vitamins and supplements.  Do not use medicines or eat foods that contain alcohol unless told by your health care provider.  Follow instructions from your health care provider or dietitian about nutritional therapy.    Keep all follow-up visits as told by your health care provider. This is important. Contact a health care provider if:  You have a fever.  You have a decreased appetite.  You have flu-like  symptoms such as fatigue, weakness, or muscle aches.  You have nausea or vomiting.  You bruise easily.  Your urine is very dark.  You develop new pain in your abdomen. Get help right away if:  You vomit blood.  You develop jaundice.  You have severely itchy skin.  Your legs swell.  Your abdomen suddenly swells.  You have stools that are black, tar-like, or bloody.  You bleed easily, such as excessive bleeding from a minor cut.  You are confused or not thinking clearly.  You have a seizure. Summary  Alcoholic hepatitis is liver inflammation that is caused by drinking a lot of alcohol over a long period of time.  Alcoholic hepatitis is diagnosed with blood tests that check liver function.  The most important part of treatment is to stop drinking alcohol. Follow your treatment plan, and work with your health care provider as needed. This information is not intended to replace advice given to you by your health care provider. Make sure you discuss any questions you have with your health care provider. Document Released: 11/11/2013 Document Revised: 08/05/2018 Document Reviewed: 12/27/2016 Elsevier Patient Education  2020 [employer].     Alcohol Abuse and Nutrition Alcohol abuse is any pattern of alcohol consumption that harms your health, relationships, or work. Alcohol abuse can cause poor nutrition (malnutrition or malnourishment) and a lack of nutrients (nutrient deficiencies), which can lead to more complications. Alcohol abuse brings malnutrition and nutrient deficiencies in two ways:  It causes your liver to work abnormally. This affects how your body divides (breaks down) and absorbs nutrients from food.  It causes you to eat poorly. Many people who abuse alcohol do not eat enough carbohydrates, protein, fat, vitamins, and minerals. Nutrients that are commonly lacking (deficient) in people who abuse alcohol include:  Vitamins. ? Vitamin A. This is needed for  your vision, metabolism, and ability to fight off infections (immunity). ? B vitamins. These include folate, thiamine, and niacin. These are needed for new cell growth. ? Vitamin C. This plays an important role in wound healing, immunity, and helping your body to absorb iron. ? Vitamin D. This is necessary for your body to absorb and use calcium. It is produced by your liver, but you can also get it from food and from sun exposure.  Minerals. ? Calcium. This is needed for healthy bones as well as heart and blood vessel (cardiovascular) function. ? Iron. This is important for blood, muscle, and nervous system functioning. ? Magnesium. This plays an important role in muscle and nerve function, and it helps to control blood sugar and blood pressure. ? Zinc. This is important for the normal functioning of your nervous system and digestive system (gastrointestinal tract). If you think that you have an alcohol dependency problem, or if it is hard to stop drinking because you feel sick or different when you do not use alcohol, talk with your health care provider or another health professional about where to get help. Nutrition is an essential factor in therapy for alcohol abuse. Your health care provider or diet and nutrition specialist (dietitian) will work with you to design a plan that can help to restore nutrients to your body and prevent the risk of complications. What is my plan? Your dietitian may develop a  specific eating plan that is based on your condition and any other problems that you have. An eating plan will commonly include:  A balanced diet. ? Grains: 6-8 oz (170-227 g) a day. Examples of 1 oz of whole grains include 1 cup of whole-wheat cereal,  cup of brown rice, or 1 slice of whole-wheat bread. ? Vegetables: 2-3 cups a day. Examples of 1 cup of vegetables include 2 medium carrots, 1 large tomato, or 2 stalks of celery. ? Fruits: 1-2 cups a day. Examples of 1 cup of fruit include 1  large banana, 1 small apple, 8 large strawberries, or 1 large orange. ? Meat and other protein: 5-6 oz (142-170 g) a day.  A cut of meat or fish that is the size of a deck of cards is about 3-4 oz.  Foods that provide 1 oz of protein include 1 egg,  cup of nuts or seeds, or 1 tablespoon (16 g) of peanut butter. ? Dairy: 2-3 cups a day. Examples of 1 cup of dairy include 8 oz (230 mL) of milk, 8 oz (230 g) of yogurt, or 1 oz (44 g) of natural cheese.  Vitamin and mineral supplements. What are tips for following this plan?  Eat frequent meals and snacks. Try to eat 5-6 small meals each day.  Take vitamin or mineral supplements as recommended by your dietitian.  If you are malnourished or if your dietitian recommends it: ? You may follow a high-protein, high-calorie diet. This may include:  2,000-3,000 calories (kilocalories) a day.  70-100 g (grams) of protein a day. ? You may be directed to follow a diet that includes a complete nutritional supplement beverage. This can help to restore calories, protein, and vitamins to your body. Depending on your condition, you may be advised to consume this beverage instead of your meals or in addition to them.  Certain medicines may cause changes in your appetite, taste, and weight. Work with your health care provider and dietitian to make any changes to your medicines and eating plan.  If you are unable to take in enough food and calories by mouth, your health care provider may recommend a feeding tube. This tube delivers nutritional supplements directly to your stomach. Recommended foods  Eat foods that are high in molecules that prevent oxygen from reacting with your food (antioxidants). These foods include grapes, berries, nuts, green tea, and dark green or orange vegetables. Eating these can help to prevent some of the stress that is placed on your liver by consuming alcohol.  Eat a variety of fresh fruits and vegetables each day. This will  help you to get fiber and vitamins in your diet.  Drink plenty of water and other clear fluids, such as apple juice and broth. Try to drink at least 48-64 oz (1.5-2 L) of water a day.  Include foods fortified with vitamins and minerals in your diet. Commonly fortified foods include milk, orange juice, cereal, and bread.  Eat a variety of foods that are high in omega-3 and omega-6 fatty acids. These include fish, nuts and seeds, and soybeans. These foods may help your liver to recover and may also stabilize your mood.  If you are a vegetarian: ? Eat a variety of protein-rich foods. ? Pair whole grains with plant-based proteins at meals and snack time. For example, eat rice with beans, put peanut butter on whole-grain toast, or eat oatmeal with sunflower seeds. The items listed above may not be a complete list of foods and  beverages you can eat. Contact a dietitian for more information. Foods to avoid  Avoid foods and drinks that are high in fat and sugar. Sugary drinks, salty snacks, and candy contain empty calories. This means that they lack important nutrients such as protein, fiber, and vitamins.  Avoid alcohol. This is the best way to avoid malnutrition due to alcohol abuse. If you must drink, drink measured amounts. Measured drinking means limiting your intake to no more than 1 drink a day for nonpregnant women and 2 drinks a day for men. One drink equals 12 oz (355 mL) of beer, 5 oz (148 mL) of wine, or 1 oz (44 mL) of hard liquor.  Limit your intake of caffeine. Replace drinks like coffee and black tea with decaffeinated coffee and decaffeinated herbal tea. The items listed above may not be a complete list of foods and beverages you should avoid. Contact a dietitian for more information. Summary  Alcohol abuse can cause poor nutrition (malnutrition or malnourishment) and a lack of nutrients (nutrient deficiencies), which can lead to more health problems.  Common nutrient deficiencies  include vitamin deficiencies (A, B, C, and D) and mineral deficiencies (calcium, iron, magnesium, and zinc).  Nutrition is an essential factor in therapy for alcohol abuse.  Your health care provider and dietitian can help you to develop a specific eating plan that includes a balanced diet plus vitamin and mineral supplements. This information is not intended to replace advice given to you by your health care provider. Make sure you discuss any questions you have with your health care provider. Document Released: 02/08/2005 Document Revised: 08/05/2018 Document Reviewed: 01/01/2017 Elsevier Patient Education  [place]. Cirrhosis  Cirrhosis is long-term (chronic) liver injury. The liver is the body's largest internal organ, and it performs many functions. It converts food into energy, removes toxic material from the blood, makes important proteins, and absorbs necessary vitamins from food. In cirrhosis, healthy liver cells are replaced by scar tissue. This prevents blood from flowing through the liver, making it difficult for the liver to function. Scarring of the liver cannot be reversed, but treatment can prevent it from getting worse. What are the causes? Common causes of this condition are hepatitis C and long-term alcohol abuse. Other causes include:  Nonalcoholic fatty liver disease. This happens when fat is deposited in the liver by causes other than alcohol.  Hepatitis B infection.  Autoimmune hepatitis. In this condition, the body's defense system (immune system) mistakenly attacks the liver cells, causing irritation and swelling (inflammation).  Diseases that cause blockage of ducts inside the liver.  Inherited liver diseases, such as hemochromatosis. This is one of the most common inherited liver diseases. In this disease, deposits of iron collect in the liver and other organs.  Reactions to certain long-term medicines, such as amiodarone, a heart medicine.  Parasitic  infections. These include schistosomiasis, which is caused by a flatworm.  Long-term contact to certain toxins. These toxins include certain organic solvents, such as toluene and chloroform. What increases the risk? You are more likely to develop this condition if:  You have certain types of viral hepatitis.  You abuse alcohol, especially if you are male.  You are overweight.  You share needles.  You have unprotected sex with someone who has viral hepatitis. What are the signs or symptoms? You may not have any signs and symptoms at first. Symptoms may not develop until the damage to your liver starts to get worse. Early symptoms may include:  Weakness  and tiredness (fatigue).  Changes in sleep patterns or having trouble sleeping.  Itchiness.  Tenderness in the right-upper part of your abdomen.  Weight loss and muscle loss.  Nausea.  Loss of appetite.  Appearance of tiny blood vessels under the skin. Later symptoms may include:  Fatigue or weakness that is getting worse.  Yellow skin and eyes (jaundice).  Buildup of fluid in the abdomen (ascites). You may notice that your clothes are tight around your waist.  Weight gain.  Swelling of the feet and ankles (edema).  Trouble breathing.  Easy bruising and bleeding.  Vomiting blood.  Black or bloody stool.  Mental confusion. How is this diagnosed? Your health care provider may suspect cirrhosis based on your symptoms and medical history, especially if you have other medical conditions or a history of alcohol abuse. Your health care provider will do a physical exam to feel your liver and to check for signs of cirrhosis. He or she may perform other tests, including:  Blood tests to check: ? For hepatitis B or C. ? Kidney function. ? Liver function.  Imaging tests such as: ? MRI or CT scan to look for changes seen in advanced cirrhosis. ? Ultrasound to see if normal liver tissue is being replaced by scar  tissue.  A procedure in which a long needle is used to take a sample of liver tissue to be checked in a lab (biopsy). Liver biopsy can confirm the diagnosis of cirrhosis. How is this treated? Treatment for this condition depends on how damaged your liver is and what caused the damage. It may include treating the symptoms of cirrhosis, or treating the underlying causes in order to slow the damage. Treatment may include:  Making lifestyle changes, such as: ? Eating a healthy diet. You may need to work with your health care provider or a diet and nutrition specialist (dietitian) to develop an eating plan. ? Restricting salt intake. ? Maintaining a healthy weight. ? Not abusing drugs or alcohol.  Taking medicines to: ? Treat liver infections or other infections. ? Control itching. ? Reduce fluid buildup. ? Reduce certain blood toxins. ? Reduce risk of bleeding from enlarged blood vessels in the stomach or esophagus (varices).  Liver transplant. In this procedure, a liver from a donor is used to replace your diseased liver. This is done if cirrhosis has caused liver failure. Other treatments and procedures may be done depending on the problems that you get from cirrhosis. Common problems include liver-related kidney failure (hepatorenal syndrome). Follow these instructions at home:   Take medicines only as told by your health care provider. Do not use medicines that are toxic to your liver. Ask your health care provider before taking any new medicines, including over-the-counter medicines.  Rest as needed.  Eat a well-balanced diet. Ask your health care provider or dietitian for more information.  Limit your salt or water intake, if your health care provider asks you to do this.  Do not drink alcohol. This is especially important if you are taking acetaminophen.  Keep all follow-up visits as told by your health care provider. This is important. Contact a health care provider if you:   Have fatigue or weakness that is getting worse.  Develop swelling of the hands, feet, legs, or face.  Have a fever.  Develop loss of appetite.  Have nausea or vomiting.  Develop jaundice.  Develop easy bruising or bleeding. Get help right away if you:  Vomit bright red blood or a material  that looks like coffee grounds.  Have blood in your stools.  Notice that your stools appear black and tarry.  Become confused.  Have chest pain or trouble breathing. Summary  Cirrhosis is chronic liver injury. Liver damage cannot be reversed. Common causes are hepatitis C and long-term alcohol abuse.  Tests used to diagnose cirrhosis include blood tests, imaging tests, and liver biopsy.  Treatment for this condition involves treating the underlying cause. Avoid alcohol, drugs, salt, and medicines that may damage your liver.  Contact your health care provider if you develop ascites, edema, jaundice, fever, nausea or vomiting, easy bruising or bleeding, or worsening fatigue. This information is not intended to replace advice given to you by your health care provider. Make sure you discuss any questions you have with your health care provider. Document Released: 04/16/2005 Document Revised: 08/06/2018 Document Reviewed: 03/06/2017 Elsevier Patient Education  2020 [employer].

## 2019-02-06 ENCOUNTER — Other Ambulatory Visit: Payer: Self-pay | Admitting: Family Medicine

## 2019-02-07 DIAGNOSIS — F1019 Alcohol abuse with unspecified alcohol-induced disorder: Secondary | ICD-10-CM | POA: Insufficient documentation

## 2019-02-11 ENCOUNTER — Ambulatory Visit: Payer: Medicare Other

## 2019-03-12 ENCOUNTER — Other Ambulatory Visit: Payer: Medicare Other

## 2019-03-12 ENCOUNTER — Ambulatory Visit: Payer: Medicare Other | Admitting: Internal Medicine

## 2019-03-13 ENCOUNTER — Other Ambulatory Visit: Payer: Medicare Other

## 2019-04-28 ENCOUNTER — Other Ambulatory Visit: Payer: Self-pay | Admitting: Family Medicine

## 2019-05-05 ENCOUNTER — Other Ambulatory Visit: Payer: Self-pay | Admitting: Family Medicine

## 2019-05-13 ENCOUNTER — Other Ambulatory Visit: Payer: Self-pay | Admitting: Family Medicine

## 2019-05-19 ENCOUNTER — Other Ambulatory Visit: Payer: Self-pay | Admitting: Family Medicine

## 2019-05-21 ENCOUNTER — Other Ambulatory Visit: Payer: Self-pay | Admitting: Family Medicine

## 2019-05-21 ENCOUNTER — Telehealth: Payer: Self-pay | Admitting: Family Medicine

## 2019-05-21 NOTE — Telephone Encounter (Signed)
Patient called request refill on :   sildenafil (VIAGRA) 100 MG tablet [id number]   Order Details Dose, Route, Frequency: As Directed  Dispense Quantity: 30 tablet Refills: 0       Sig: TAKE 1/2 TO 1 (ONE-HALF TO ONE) TABLET BY MOUTH 30 MINUTES PRIOR TO INTERCOURSE AS NEEDED   ---Forwarding request to med asst to that if approved send refill order to:  Preferred [facility] [address], [place] [phone] (Phone) [phone] (Fax)   --glh

## 2019-05-21 NOTE — Telephone Encounter (Signed)
Patient is aware that the medication can not be refilled until he is seen. Patient has apt on 05/28/19. AS, CMA

## 2019-05-22 ENCOUNTER — Ambulatory Visit (INDEPENDENT_AMBULATORY_CARE_PROVIDER_SITE_OTHER): Payer: Medicare Other | Admitting: Internal Medicine

## 2019-05-22 ENCOUNTER — Other Ambulatory Visit: Payer: Self-pay

## 2019-05-22 ENCOUNTER — Other Ambulatory Visit: Payer: Medicare Other

## 2019-05-22 ENCOUNTER — Encounter: Payer: Self-pay | Admitting: Internal Medicine

## 2019-05-22 DIAGNOSIS — Z789 Other specified health status: Secondary | ICD-10-CM

## 2019-05-22 DIAGNOSIS — E781 Pure hyperglyceridemia: Secondary | ICD-10-CM | POA: Diagnosis not present

## 2019-05-22 DIAGNOSIS — Z794 Long term (current) use of insulin: Secondary | ICD-10-CM | POA: Diagnosis not present

## 2019-05-22 DIAGNOSIS — E1165 Type 2 diabetes mellitus with hyperglycemia: Secondary | ICD-10-CM | POA: Diagnosis not present

## 2019-05-22 DIAGNOSIS — K759 Inflammatory liver disease, unspecified: Secondary | ICD-10-CM

## 2019-05-22 DIAGNOSIS — E669 Obesity, unspecified: Secondary | ICD-10-CM

## 2019-05-22 MED ORDER — OZEMPIC (0.25 OR 0.5 MG/DOSE) 2 MG/1.5ML ~~LOC~~ SOPN: 0.5000 mg | PEN_INJECTOR | SUBCUTANEOUS | 5 refills | Status: DC

## 2019-05-22 NOTE — Patient Instructions (Addendum)
Please start - Ozempic 0.25 mg weekly in a.m. (for example on Sunday morning) x 4 weeks, then increase to 0.5 mg weekly in a.m. if no nausea or hypoglycemia.  Please continue: - Lantus 25 units 2x a day  Please decrease: - Novolog 8-14 units 15 min before lunch and dinner  Please return in 3 months with your sugar log.

## 2019-05-22 NOTE — Progress Notes (Signed)
Patient ID: Louis Carney, male   DOB: 11-13-1980, 39 y.o.   MRN: [id number]   Patient location: Home My location: Office Persons participating in the virtual visit: patient, provider  Referring Provider: Mellody Dance, DO  I connected with the patient on 05/22/19 at  8:58 AM EST by a video enabled telemedicine application and verified that I am speaking with the correct person.   I discussed the limitations of evaluation and management by telemedicine and the availability of in person appointments. The patient expressed understanding and agreed to proceed.   Details of the encounter are shown below.  HPI: Louis Carney is a 39 y.o.-year-old male, initially referred by his PCP, Dr. Raliegh Scarlet, returning for follow-up for DM2, dx in 09/2018, insulin-dependent, uncontrolled, without long-term complications.  Last visit 4 months ago.  Reviewed history: He had dizziness, blurry vision >> checked sugar with neighbor's meter >> CBG high (HI, then rechecked: 540).  He saw PCP >> HbA1c was high x2 >> added insulin.  He felt much better after adding insulin.  However, sugars were still extremely high at last visit so we added rapid acting insulin and I made specific suggestions about his diet and alcohol intake.  Reviewed HbA1c levels: Lab Results  Component Value Date   HGBA1C 6.0 (A) 01/20/2019   HGBA1C 9.9 (A) 10/27/2018   HGBA1C 10.2 (H) 10/27/2018   HGBA1C 5.6 03/06/2018   HGBA1C 5.3 08/26/2017   Pt is on a regimen of: - Lantus 25 units 2x a day >> 24 units 2x a day - Novolog 12-14 units 15 min before lunch and dinner - added 10/2018  Pt checks his sugars 2 times a week: - am: 202-300 >> 105-132 >> 107-140 - 2h after b'fast: n/c - before lunch: 300-340 >> <130 >> 140s - 2h after lunch: n/c - before dinner: 340 >> 128-132 >> n/c - 2h after dinner: n/c - bedtime: 340 >> 130s >> up to 189 - nighttime: n/c Lowest sugar was 202 >> 105 >> 107; it is unclear at which CBG level he has  hypoglycemia awareness. Highest sugar was 460 >> 141 >> 189.  Glucometer:ReliOn  Pt's meals are: - Breakfast: skips - Lunch: [place] sandwich  - Dinner: lasagna, hamburger, steak + potatoes, pasta - Snacks:cookies, chips. He does not like greens. In the past, he was drinking 4-6 shots of vodka, 6 pack beer EVERY night >> then 5 Bud Light beers a night >> then liquor/vodka over Christmas >> now off.  -No history of CKD.  Last BUN/creatinine:  Lab Results  Component Value Date   BUN 8 11/06/2018   BUN 25 (H) 10/27/2018   CREATININE 0.79 11/06/2018   CREATININE 1.06 10/27/2018  On olmesartan.  -He had significant hypertriglyceridemia and very low HDL, which improved at last check; last set of lipids: Lab Results  Component Value Date   CHOL 267 (H) 01/20/2019   HDL 32.90 (L) 01/20/2019   LDLCALC Comment 10/27/2018   LDLDIRECT 184.0 01/20/2019   TRIG 386.0 (H) 01/20/2019   CHOLHDL 8 01/20/2019  Case of omega-3 fatty acids for investigation for transaminitis.  He started to see Dr. Carlean Purl with [facility].    Lab Results  Component Value Date   ALT 102 (H) 01/20/2019   AST 119 (H) 01/20/2019   ALKPHOS 50 01/20/2019   BILITOT 0.7 01/20/2019   - last eye exam was in 12/2018: No DR  - no numbness and tingling in his feet.  Pt has FH of DM in father -  type 2 DM. + significant alcoholism FH.  He is on Risankizumab in 04/2018 for psoriasis >> working. Prev. Embrel, Humira, etc.  No h/o Pancreatitis or MTC.  ROS: Constitutional: + weight gain/no weight loss, no fatigue, no subjective hyperthermia, no subjective hypothermia Eyes: no blurry vision, no xerophthalmia ENT: no sore throat, no nodules palpated in neck, no dysphagia, no odynophagia, no hoarseness Cardiovascular: no CP/no SOB/no palpitations/no leg swelling Respiratory: no cough/no SOB/no wheezing Gastrointestinal: no N/no V/no D/no C/no acid reflux Musculoskeletal: no muscle aches/no joint aches Skin: no rashes,  no hair loss Neurological: no tremors/no numbness/no tingling/no dizziness  I reviewed pt's medications, allergies, PMH, social hx, family hx, and changes were documented in the history of present illness. Otherwise, unchanged from my initial visit note.  Past Medical History:  Diagnosis Date  . Arthritis   . Diabetes ([place])    type 1  . GERD (gastroesophageal reflux disease)   . Headache    migraines  . Hypertension   . Psoriasis    since age 73 years  . Tuberculosis    latent TB infection on treatment   Past Surgical History:  Procedure Laterality Date  . CARPAL TUNNEL RELEASE Right 01/15/2018   Procedure: Right Carpal tunnel release;  Surgeon: Ashok Pall, MD;  Location: [facility];  Service: Neurosurgery;  Laterality: Right;  Right Carpal tunnel release  . CERVICAL SPINE SURGERY    . LUMBAR LAMINECTOMY/DECOMPRESSION MICRODISCECTOMY Right 03/07/2018   Procedure: Right Lumbar 5 Sacral 1 Microdiscectomy;  Surgeon: Ashok Pall, MD;  Location: [facility];  Service: Neurosurgery;  Laterality: Right;  Right Lumbar 5 Sacral 1 Microdiscectomy  . NECK SURGERY     Social History   Socioeconomic History  . Marital status: Married    Spouse name: Not on file  . Number of children: 5  . Years of education: Not on file  . Highest education level: Not on file  Occupational History  . Occupation: disabled  Tobacco Use  . Smoking status: Current Every Day Smoker    Packs/day: 1.00    Years: 15.00    Pack years: 15.00    Types: Cigarettes  . Smokeless tobacco: Never Used  Substance and Sexual Activity  . Alcohol use: Yes    Alcohol/week: 24.0 standard drinks    Types: 24 Standard drinks or equivalent per week  . Drug use: Never  . Sexual activity: Yes    Partners: Female    Birth control/protection: None  Other Topics Concern  . 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 Difficulty of Paying Living Expenses: Not on file  Food Insecurity:   . Worried About Charity fundraiser in the Last Year: Not on file  . Ran Out of Food in the Last Year: Not on file  Transportation Needs:   . Lack of Transportation (Medical): Not on file  . Lack of Transportation (Non-Medical): Not on file  Physical Activity:   . Days of Exercise per Week: Not on file  . Minutes of Exercise per Session: Not on file  Stress:   . Feeling of Stress : Not on file  Social Connections:   . Frequency of  Communication with Friends and Family: Not on file  . Frequency of Social Gatherings with Friends and Family: Not on file  . Attends Religious Services: Not on file  . Active Member of Clubs or Organizations: Not on file  . Attends Archivist Meetings: Not on file  . Marital Status: Not on file  Intimate Partner Violence:   . Fear of Current or Ex-Partner: Not on file  . Emotionally Abused: Not on file  . Physically Abused: Not on file  . Sexually Abused: Not on file   Current Outpatient Medications on File Prior to Visit  Medication Sig Dispense Refill  . blood glucose meter kit and supplies Dispense based on patient and insurance preference. Use to check glucose level three times daily before meals and 3 times daily 2 hours after meals (6 times daily total). (FOR ICD-10 E10.9, E11.9). 1 each 0  . esomeprazole (NEXIUM) 20 MG capsule Take 20 mg by mouth daily at 12 noon.    . gabapentin (NEURONTIN) 300 MG capsule Take 2 capsules by mouth 2 (two) times daily.    Marland Kitchen ibuprofen (ADVIL) 200 MG tablet Take 800 mg by mouth 2 (two) times daily.    . insulin aspart (NOVOLOG FLEXPEN) 100 UNIT/ML FlexPen Inject 10-14 Units into the skin 3 (three) times daily with meals. 15 mL 11  . Insulin Glargine (LANTUS SOLOSTAR) 100  UNIT/ML Solostar Pen Inject 15 units into the skin twice daily-12 hours apart. 5 pen PRN  . Olmesartan-amLODIPine-HCTZ 40-10-25 MG TABS Take 1 tablet by mouth daily. 1QD 90 tablet 0  . Risankizumab-rzaa,150 MG Dose, (SKYRIZI, 150 MG DOSE,) 75 MG/0.83ML PSKT Inject 300 mg into the skin every 3 (three) months. 150 mg (1 injection) in each leg every 3 months    . sildenafil (VIAGRA) 100 MG tablet TAKE 1/2 TO 1 (ONE-HALF TO ONE) TABLET BY MOUTH 30 MINUTES PRIOR TO INTERCOURSE AS NEEDED 30 tablet 0   No current facility-administered medications on file prior to visit.   No Known Allergies Family History  Problem Relation Age of Onset  . Alcohol abuse Mother   . Hypertension Mother   . Lung cancer Mother   . Alcohol abuse Father   . Hypertension Father   . Colon cancer Father        dx in his late 47's  . Esophageal cancer Neg Hx   . Rectal cancer Neg Hx     PE: 2 weeks ago: 235 lbs There were no vitals taken for this visit. Wt Readings from Last 3 Encounters:  02/05/19 231 lb (104.8 kg)  01/20/19 225 lb (102.1 kg)  01/16/19 215 lb (97.5 kg)   Constitutional:  in NAD  The physical exam was not performed (virtual visit).  ASSESSMENT: 1. DM, insulin-dependent, without long-term complications  2. HTG  3.  Obesity  PLAN:  1. Patient with a relatively recent diagnosis of diabetes, insulin-dependent.  His sugars improved significantly after improving his diet.  He did see nutrition before last visit.  He started to cut out fast foods, starches, snacks.  At last visit, sugars were much better.  We ruled out type 1 diabetes so now we can work on decreasing his insulin doses if he continues with dietary changes.  He also needs to stop drinking alcohol.  At last visit he was telling me that he was able to stop liquor but was still drinking several like beers a night. -At last visit, we ruled out type 1 diabetes -Of note, he  could not afford the CGM -At this visit, CBGs are still above  target, and they started to improve after he stops drinking hard liquor again.  I strongly advised him to continue without alcohol. -I suggested to start Ozempic weekly at the low dose and increase as tolerated to both help him with blood sugars and weight.  She agrees to try this.  In the meantime, I advised him to try to decrease the NovoLog dose if his sugars improved after starting Ozempic.  For now, we will continue the same dose of Lantus. - I suggested to:  Patient Instructions  Please start - Ozempic 0.25 mg weekly in a.m. (for example on Sunday morning) x 4 weeks, then increase to 0.5 mg weekly in a.m. if no nausea or hypoglycemia.  Please continue: - Lantus 25 units 2x a day  Please decrease: - Novolog 8-14 units 15 min before lunch and dinner  Please return in 3 months with your sugar log.   - we will recheck an HbA1c when he returns to the clinic - advised to check sugars at different times of the day - 2-3x a day, rotating check times - advised for yearly eye exams >> he is UTD - return to clinic in 3 months  2. HTG -Reviewed latest lipid panel from 12/2018: LDL increased after significant improvement of his triglycerides: Lab Results  Component Value Date   CHOL 267 (H) 01/20/2019   HDL 32.90 (L) 01/20/2019   LDLCALC Comment 10/27/2018   LDLDIRECT 184.0 01/20/2019   TRIG 386.0 (H) 01/20/2019   CHOLHDL 8 01/20/2019  -Hyperlipidemia medication resolved hold pending stopping alcohol and improving his liver function  3.  Obesity -He continues to adjust his diet by cutting out starches, fast food, and snacks, but unfortunately, he relaxed his diet over the holidays and restarted to drink liquor.  He recently stopped again. -Before last visit he gained 10 pounds and since then, he gained another 10 since last visit. -We will start a GLP-1 receptor agonist, which should also help with weight loss  Philemon Kingdom, MD PhD [facility]

## 2019-05-22 NOTE — Progress Notes (Signed)
Labs ordered per Dr. Raliegh Scarlet. She reviewed before labs drawn. AS, CMA

## 2019-05-23 LAB — HEPATITIS PANEL, ACUTE
Hep A IgM: NEGATIVE
Hep B C IgM: NEGATIVE
Hep C Virus Ab: <0.1 s/co ratio (ref 0.0–0.9)
Hepatitis B Surface Ag: NEGATIVE

## 2019-05-25 ENCOUNTER — Other Ambulatory Visit: Payer: Self-pay | Admitting: Family Medicine

## 2019-05-28 ENCOUNTER — Ambulatory Visit (INDEPENDENT_AMBULATORY_CARE_PROVIDER_SITE_OTHER): Payer: Medicare Other | Admitting: Family Medicine

## 2019-05-28 ENCOUNTER — Encounter: Payer: Self-pay | Admitting: Family Medicine

## 2019-05-28 ENCOUNTER — Other Ambulatory Visit: Payer: Self-pay

## 2019-05-28 ENCOUNTER — Other Ambulatory Visit: Payer: Self-pay | Admitting: Family Medicine

## 2019-05-28 VITALS — BP 132/81 | HR 94 | Temp 98.7°F | Ht 68.0 in | Wt 232.1 lb

## 2019-05-28 DIAGNOSIS — E1169 Type 2 diabetes mellitus with other specified complication: Secondary | ICD-10-CM

## 2019-05-28 DIAGNOSIS — E785 Hyperlipidemia, unspecified: Secondary | ICD-10-CM

## 2019-05-28 DIAGNOSIS — Z794 Long term (current) use of insulin: Secondary | ICD-10-CM

## 2019-05-28 DIAGNOSIS — S93419A Sprain of calcaneofibular ligament of unspecified ankle, initial encounter: Secondary | ICD-10-CM | POA: Insufficient documentation

## 2019-05-28 DIAGNOSIS — F1019 Alcohol abuse with unspecified alcohol-induced disorder: Secondary | ICD-10-CM | POA: Diagnosis not present

## 2019-05-28 DIAGNOSIS — K7010 Alcoholic hepatitis without ascites: Secondary | ICD-10-CM | POA: Insufficient documentation

## 2019-05-28 DIAGNOSIS — E119 Type 2 diabetes mellitus without complications: Secondary | ICD-10-CM | POA: Insufficient documentation

## 2019-05-28 DIAGNOSIS — F4323 Adjustment disorder with mixed anxiety and depressed mood: Secondary | ICD-10-CM

## 2019-05-28 DIAGNOSIS — E781 Pure hyperglyceridemia: Secondary | ICD-10-CM

## 2019-05-28 DIAGNOSIS — E1159 Type 2 diabetes mellitus with other circulatory complications: Secondary | ICD-10-CM

## 2019-05-28 DIAGNOSIS — I10 Essential (primary) hypertension: Secondary | ICD-10-CM

## 2019-05-28 DIAGNOSIS — Z789 Other specified health status: Secondary | ICD-10-CM

## 2019-05-28 DIAGNOSIS — F1010 Alcohol abuse, uncomplicated: Secondary | ICD-10-CM

## 2019-05-28 NOTE — Progress Notes (Signed)
Impression and Recommendations:    1. Type 2 diabetes mellitus with other specified complication, with long-term current use of insulin ([place])   2. Alcohol abuse with alcohol-induced disorder ([place])   3. Hyperlipidemia associated with type 2 diabetes mellitus ([place])   4. Hypertension associated with diabetes ([place])   5. Morbid obesity ([place])   6. Adjustment disorder with mixed anxiety and depressed mood   7. Hypertriglyceridemia   8. Drinks beer- 6-8 beers/d on ave ( 10 Yrs or so)   9. Alcohol abuse   10. Alcoholic hepatitis without ascites   11. Sprain of calcaneofibular ligament of ankle, initial encounter- L-  pt refused xrays of ankle      Type 2 DM - Last went to see Dr. Cruzita Lederer on 05/22/2019 for diabetes management. - Began Ozempic last OV via endocrinology.  - Continue treatment plan as established by specialist.  - Counseled patient on pathophysiology of disease and discussed various treatment options, which always includes dietary and lifestyle modification as first line.    - Importance of low carb, heart-healthy diet discussed with patient in addition to regular aerobic exercise of 50min 5d/week or more.   - Also told patient if you ever feel poorly, please check your blood pressure and blood sugar, as one or the other could be the cause of your symptoms.  - Patient being monitored closely by endocrinology with return OV in 3 months. - Will continue to monitor alongside specialist.  Alcohol Abuse w/ Alcohol-Induced Disorder - Alcoholic Hepatitis w/out ascites Drinks beer 6-8 beers/d on average for 10 years or so, elevated liver enzymes - Has been evaluated by Dr. Carlean Purl of gastroenterology. - Patient had a hepatic panel drawn, which was negative. - Workup obtained to assess for genetic causes of hepatitis, which was negative.  - Per patient, has reduced alcohol use to 6 beers, two days per week.  - Discussed that patient's heavy alcohol use is causing detriment  and damage to his liver and the rest of his body, contributing to his diabetes and causing alcoholic hepatitis.  - Extensive education provided to patient today.  All questions answered.  - Discussed recommendation for patient to be sent to outpatient alcohol recovery program.  Patient declines.  - Advised patient to establish with counselor/therapist.  Patient declines counseling.  - If patient refuses to discontinue alcohol use, strongly advised patient to cut down by one beer every two weeks.  - Will continue to monitor closely.  GERD - Stable on Nexium management.  - Reviewed that alcohol use contributes to worsening GERD. - Encouraged patient to discontinue use of alcohol completely.  - Continue Nexium as prescribed.  - Will continue to monitor.  Sprain of calcaneofibular ligament of ankle, initial encounter- L-  pt refused xrays of ankle - Discussed patient's symptoms during appointment today. - Advised X-ray for further evaluation of the area.  Patient declines.  - Advised patient to elevate and ice the area for 15-20 minutes as often as possible. - Will continue to monitor.  Patient knows to call in if symptoms worsen or change.  Hypertension associated with DM - Blood pressure currently is stable, at goal. - Patient will continue current treatment regimen.  See med list.  - Discussed that heavy alcohol use contributes to hypertension.  Encouraged patient to continue to reduce use of alcohol, with goal of sobriety.  - Counseled patient on pathophysiology of disease and discussed various treatment options, which always includes dietary and lifestyle modification  as first line.   - Lifestyle changes such as dash and heart healthy diets and engaging in a regular exercise program discussed extensively with patient.   - Ambulatory blood pressure monitoring encouraged at least 3 times weekly.  Keep log and bring in every office visit.  Reminded patient that if they ever feel  poorly in any way, to check their blood pressure and pulse.  - We will continue to monitor.  BMI Counseling - Body mass index is 35.29 kg/m, Morbid Obesity Explained to patient what BMI refers to, and what it means medically.    Told patient to think about it as a "medical risk stratification measurement" and how increasing BMI is associated with increasing risk/ or worsening state of various diseases such as hypertension, hyperlipidemia, diabetes, premature OA, depression etc.  American Heart Association guidelines for healthy diet, basically Mediterranean diet, and exercise guidelines of 30 minutes 5 days per week or more discussed in detail.  Health counseling performed.  All questions answered.  Lifestyle & Preventative Health Maintenance - Advised patient to continue working toward exercising and prudent weight loss to improve overall mental, physical, and emotional health.    - Reviewed the "spokes of the wheel" of mood and health management.  Stressed the importance of ongoing prudent habits, including regular exercise, appropriate sleep hygiene, healthful dietary habits, and prayer/meditation to relax.  - Encouraged patient to engage in daily physical activity as tolerated, especially a formal exercise routine.  Recommended that the patient eventually strive for at least 150 minutes of moderate cardiovascular activity per week according to guidelines established by the [facility].   - Healthy dietary habits encouraged, including low-carb, and high amounts of lean protein in diet.   - Patient should also consume adequate amounts of water.     No orders of the defined types were placed in this encounter.   No orders of the defined types were placed in this encounter.   There are no discontinued medications.   Gross side effects, risk and benefits, and alternatives of medications and treatment plan in general discussed with patient.  Patient is aware that all medications have  potential side effects and we are unable to predict every side effect or drug-drug interaction that may occur.   Patient will call with any questions prior to using medication if they have concerns.    Expresses verbal understanding and consents to current therapy and treatment regimen.  No barriers to understanding were identified.  Red flag symptoms and signs discussed in detail.  Patient expressed understanding regarding what to do in case of emergency\urgent symptoms  Please see AVS handed out to patient at the end of our visit for further patient instructions/ counseling done pertaining to today's office visit.   Return for f/up 3 months- Bp, ETOH use etc.     Note:  This note was prepared with assistance of Dragon voice recognition software. Occasional wrong-word or sound-a-like substitutions may have occurred due to the inherent limitations of voice recognition software.   This document serves as a record of services personally performed by Mellody Dance, DO. It was created on her behalf by Toni Amend, a trained medical scribe. The creation of this record is based on the scribe's personal observations and the provider's statements to them.   This case required medical decision making of at least moderate complexity. The above documentation has been reviewed to be accurate and was completed by Marjory Sneddon, D.O.       --------------------------------------------------------------------------------------------------------------------------------------------------------------------------------------------------------------------------------------------  Subjective:     HPI: Louis Carney is a 39 y.o. male who presents to [place] at [facility] today for issues as discussed below.  - Left Ankle Inverted Last Sunday, rolled (inverted) his left ankle.  Says "that day it was almost fine, but the next day it was worse."  Notes the pain hurts very  badly, but he feels it is improving.  He just "wants it to be wrapped."  - Heavy Alcohol Use Says he has cut back a lot since advised, but has not stopped drinking.  He has discontinued use of liquor but continues drinking twelve beers per week, in intervals of six beers, two nights per week.   Notes "I've been through this so many times."  Says "functioning alcoholic is a whole lot different than alcohol abuse."  Notes "I've been through all the programs and the treatment and all that shit," and indicates that he is frustrated by the continued attention to alcohol cessation.  Says "no program is going to fix it."  Notes "I'm having maybe six drinks, and that's twice per week."  Says "before it was shots every night, a twelve pack every night, five hour energy every night."  He is very resistant to alcohol sobriety.  Says that stopping "the liquor was hard enough."  Notes he hasn't touched liquor since he stopped, despite the fact that his wife drinks liquor every night.  "The alcohol's down to maybe two nights per week, instead of seven."  Notes he is not ready to go to an outpatient treatment program because "I can still make that decision, I'm not to where I can say I can't quit yet."  States "I just like to have a couple of beers; not because I need it or because I'm depressed.  I just like it."  Says "I'm gonna quit, just give me a chance."  - GERD Stable at this time.  Continues Nexium.  Notes at times recently, he hasn't had to take the Nexium as often, going a couple of days here and there without needing it.  - Mood Management Notes he's thinking of making a lot of life changes in the near future, and this does weigh on him heavily.  - Dietary Habits Says "I want to go keto but I can't figure out how to."  HPI:  Hypertension:  -  His blood pressure at home has been running: WNL.  - Patient reports good compliance with medication and/or lifestyle modification  - His denies acute  concerns or problems related to treatment plan  - He denies new onset of: chest pain, exercise intolerance, shortness of breath, dizziness, visual changes, headache, lower extremity swelling or claudication.   Last 3 blood pressure readings in our office are as follows: BP Readings from Last 3 Encounters:  05/28/19 132/81  02/05/19 (!) 176/109  01/20/19 128/80   Filed Weights   05/28/19 1024  Weight: 232 lb 1.3 oz (105.3 kg)   HPI:   Diabetes Mellitus:  Followed closely by Dr. Cruzita Lederer of endocrinology.  - Patient reports good compliance with therapy plan: medication and/or lifestyle modification  - His denies acute concerns or problems related to treatment plan  - He denies new concerns.  Denies polyuria/polydipsia, hypo/ hyperglycemia symptoms.  Denies new onset of: chest pain, exercise intolerance, shortness of breath, dizziness, visual changes, headache, lower extremity swelling or claudication.   Last A1C in the office was:  Lab Results  Component Value Date   HGBA1C  6.0 (A) 01/20/2019   HGBA1C 9.9 (A) 10/27/2018   HGBA1C 10.2 (H) 10/27/2018   Lab Results  Component Value Date   LDLCALC Comment 10/27/2018   CREATININE 0.79 11/06/2018   BP Readings from Last 3 Encounters:  05/28/19 132/81  02/05/19 (!) 176/109  01/20/19 128/80   Wt Readings from Last 3 Encounters:  05/28/19 232 lb 1.3 oz (105.3 kg)  02/05/19 231 lb (104.8 kg)  01/20/19 225 lb (102.1 kg)     Wt Readings from Last 3 Encounters:  05/28/19 232 lb 1.3 oz (105.3 kg)  02/05/19 231 lb (104.8 kg)  01/20/19 225 lb (102.1 kg)   BP Readings from Last 3 Encounters:  05/28/19 132/81  02/05/19 (!) 176/109  01/20/19 128/80   Pulse Readings from Last 3 Encounters:  05/28/19 94  02/05/19 (!) 101  01/20/19 88   BMI Readings from Last 3 Encounters:  05/28/19 35.29 kg/m  02/05/19 35.12 kg/m  01/20/19 34.21 kg/m     Patient Care Team    Relationship Specialty Notifications Start End  Mellody Dance, DO PCP - General Family Medicine  08/26/17   Arlyss Gandy, PA-C  Dermatology  07/31/18    Comment: Rocco Serene, MD Consulting Physician Neurosurgery  07/31/18   Thayer Headings, MD Consulting Physician Infectious Diseases  07/31/18   Philemon Kingdom, MD Consulting Physician Endocrinology  12/25/18      Patient Active Problem List   Diagnosis Date Noted  . Alcohol abuse with alcohol-induced disorder ([place]) 02/07/2019  . Hyperlipidemia associated with type 2 diabetes mellitus ([place]) 09/12/2017  . Cigarette smoker- 3 ppd for 6 yrs, then 1ppd for 9 yrs- 27 pk yr hx 08/26/2017  . Hypertension associated with diabetes ([place]) 08/26/2017  . Adjustment disorder with mixed anxiety and depressed mood 02/05/2019  . Hypertriglyceridemia 09/12/2017  . 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 RSD (reflex sympathetic dystrophy)- right entire leg painful to non-noxious stimuli 08/26/2017  . Drinks beer- 6-8 beers/d on ave ( 10 Yrs or so) 08/26/2017  . NSAID long-term use-4 ibuprofen 3 times daily times at least 5 years 08/26/2017  . 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 Mood disorder ([place])- secondary to chronic pain syndrome 08/26/2017  . Insomnia 08/26/2017  . Elevated liver function tests 11/07/2018  . Family history of diabetes mellitus in father 09/12/2017  . Vitamin D deficiency 09/12/2017  . Psoriasis-onset age 23 has needed Cosentyx in past has failed Enbrel and Humira 08/26/2017  . ED (erectile dysfunction) 08/26/2017  . Diabetes mellitus ([place]) 05/28/2019  . Morbid obesity ([place]) 05/28/2019  . Alcoholic hepatitis without ascites 05/28/2019  . Sprain of calcaneofibular ligament of ankle, initial encounter- L-  pt refused xrays of ankle 05/28/2019  . Hepatitis 02/05/2019  . Hepatocellular damage 11/07/2018  . Nausea 11/07/2018  . Alcohol abuse 09/09/2018  . HNP (herniated  nucleus pulposus), lumbar 03/07/2018  . Transaminitis 01/29/2018  . Medication monitoring encounter 11/29/2017  . TB lung, latent 11/06/2017  . High risk medication use 09/12/2017  . Low level of high density lipoprotein (HDL) 09/12/2017  . Chronic radicular low back pain- R sided 08/26/2017  . History of excision of lamina of cervical vertebra for decompression of sp- C4-6inal cord 08/26/2017  . Chronic fatigue 08/26/2017  . Environmental and seasonal allergies 08/26/2017  . Allergic conjunctivitis of both eyes and rhinitis 08/26/2017  . Class 1 obesity 08/26/2017  . Plaque psoriasis 08/26/2017  Past Medical history, Surgical history, Family history, Social history, Allergies and Medications have been entered into the medical record, reviewed and changed as needed.    No outpatient medications have been marked as taking for the 05/28/19 encounter (Office Visit) with Mellody Dance, DO.    Allergies:  No Known Allergies   Review of Systems:  A fourteen system review of systems was performed and found to be positive as per HPI.   Objective:   Blood pressure 132/81, pulse 94, temperature 98.7 F (37.1 C), temperature source Oral, height 5\' 8"  (1.727 m), weight 232 lb 1.3 oz (105.3 kg), SpO2 97 %. Body mass index is 35.29 kg/m. General:  Well Developed, well nourished, appropriate for stated age.  Neuro:  Alert and oriented,  extra-ocular muscles intact  HEENT:  Normocephalic, atraumatic, neck supple, no carotid bruits appreciated  Skin:  no gross rash, warm, pink. Cardiac:  RRR, S1 S2 Respiratory:  ECTA B/L and A/P with rhonchorous breath sounds bilateral lower lobes, Not using accessory muscles, speaking in full sentences- unlabored. Vascular:  Ext warm, no cyanosis apprec.; cap RF less 2 sec. Psych:  No HI/SI, judgement and insight good, Euthymic mood. Full Affect. Ankle:  Tender along the CFL ligament with pain upon inversion of foot, as well as pain upon palpation of  the ant aspect of distal fibula

## 2019-05-28 NOTE — Patient Instructions (Addendum)
You may read about keto diet on [employer]     Cirrhosis  Cirrhosis is long-term (chronic) liver injury. The liver is the body's largest internal organ, and it performs many functions. It converts food into energy, removes toxic material from the blood, makes important proteins, and absorbs necessary vitamins from food. In cirrhosis, healthy liver cells are replaced by scar tissue. This prevents blood from flowing through the liver, making it difficult for the liver to function. Scarring of the liver cannot be reversed, but treatment can prevent it from getting worse. What are the causes? Common causes of this condition are hepatitis C and long-term alcohol abuse. Other causes include:  Nonalcoholic fatty liver disease. This happens when fat is deposited in the liver by causes other than alcohol.  Hepatitis B infection.  Autoimmune hepatitis. In this condition, the body's defense system (immune system) mistakenly attacks the liver cells, causing irritation and swelling (inflammation).  Diseases that cause blockage of ducts inside the liver.  Inherited liver diseases, such as hemochromatosis. This is one of the most common inherited liver diseases. In this disease, deposits of iron collect in the liver and other organs.  Reactions to certain long-term medicines, such as amiodarone, a heart medicine.  Parasitic infections. These include schistosomiasis, which is caused by a flatworm.  Long-term contact to certain toxins. These toxins include certain organic solvents, such as toluene and chloroform. What increases the risk? You are more likely to develop this condition if:  You have certain types of viral hepatitis.  You abuse alcohol, especially if you are male.  You are overweight.  You share needles.  You have unprotected sex with someone who has viral hepatitis. What are the signs or symptoms? You may not have any signs and symptoms at first. Symptoms may not develop  until the damage to your liver starts to get worse. Early symptoms may include:  Weakness and tiredness (fatigue).  Changes in sleep patterns or having trouble sleeping.  Itchiness.  Tenderness in the right-upper part of your abdomen.  Weight loss and muscle loss.  Nausea.  Loss of appetite.  Appearance of tiny blood vessels under the skin. Later symptoms may include:  Fatigue or weakness that is getting worse.  Yellow skin and eyes (jaundice).  Buildup of fluid in the abdomen (ascites). You may notice that your clothes are tight around your waist.  Weight gain.  Swelling of the feet and ankles (edema).  Trouble breathing.  Easy bruising and bleeding.  Vomiting blood.  Black or bloody stool.  Mental confusion. How is this diagnosed? Your health care provider may suspect cirrhosis based on your symptoms and medical history, especially if you have other medical conditions or a history of alcohol abuse. Your health care provider will do a physical exam to feel your liver and to check for signs of cirrhosis. He or she may perform other tests, including:  Blood tests to check: ? For hepatitis B or C. ? Kidney function. ? Liver function.  Imaging tests such as: ? MRI or CT scan to look for changes seen in advanced cirrhosis. ? Ultrasound to see if normal liver tissue is being replaced by scar tissue.  A procedure in which a long needle is used to take a sample of liver tissue to be checked in a lab (biopsy). Liver biopsy can confirm the diagnosis of cirrhosis. How is this treated? Treatment for this condition depends on how damaged your liver is and what caused the damage. It may include treating  the symptoms of cirrhosis, or treating the underlying causes in order to slow the damage. Treatment may include:  Making lifestyle changes, such as: ? Eating a healthy diet. You may need to work with your health care provider or a diet and nutrition specialist (dietitian) to  develop an eating plan. ? Restricting salt intake. ? Maintaining a healthy weight. ? Not abusing drugs or alcohol.  Taking medicines to: ? Treat liver infections or other infections. ? Control itching. ? Reduce fluid buildup. ? Reduce certain blood toxins. ? Reduce risk of bleeding from enlarged blood vessels in the stomach or esophagus (varices).  Liver transplant. In this procedure, a liver from a donor is used to replace your diseased liver. This is done if cirrhosis has caused liver failure. Other treatments and procedures may be done depending on the problems that you get from cirrhosis. Common problems include liver-related kidney failure (hepatorenal syndrome). Follow these instructions at home:   Take medicines only as told by your health care provider. Do not use medicines that are toxic to your liver. Ask your health care provider before taking any new medicines, including over-the-counter medicines.  Rest as needed.  Eat a well-balanced diet. Ask your health care provider or dietitian for more information.  Limit your salt or water intake, if your health care provider asks you to do this.  Do not drink alcohol. This is especially important if you are taking acetaminophen.  Keep all follow-up visits as told by your health care provider. This is important. Contact a health care provider if you:  Have fatigue or weakness that is getting worse.  Develop swelling of the hands, feet, legs, or face.  Have a fever.  Develop loss of appetite.  Have nausea or vomiting.  Develop jaundice.  Develop easy bruising or bleeding. Get help right away if you:  Vomit bright red blood or a material that looks like coffee grounds.  Have blood in your stools.  Notice that your stools appear black and tarry.  Become confused.  Have chest pain or trouble breathing. Summary  Cirrhosis is chronic liver injury. Liver damage cannot be reversed. Common causes are hepatitis C and  long-term alcohol abuse.  Tests used to diagnose cirrhosis include blood tests, imaging tests, and liver biopsy.  Treatment for this condition involves treating the underlying cause. Avoid alcohol, drugs, salt, and medicines that may damage your liver.  Contact your health care provider if you develop ascites, edema, jaundice, fever, nausea or vomiting, easy bruising or bleeding, or worsening fatigue. This information is not intended to replace advice given to you by your health care provider. Make sure you discuss any questions you have with your health care provider. Document Revised: 08/06/2018 Document Reviewed: 03/06/2017 Elsevier Patient Education  [place].    Alcoholic Hepatitis  Alcoholic hepatitis is liver inflammation that is caused by drinking a lot of alcohol over a long period of time. This inflammation decreases the liver's ability to function normally. This condition requires you to stop drinking alcohol permanently to prevent further damage. What are the causes? Alcoholic hepatitis is caused by long-term (chronic) heavy alcohol use. The liver filters alcohol out of the bloodstream. When alcohol gets divided into small particles (broken down) in the liver, substances are produced that can damage liver cells. This causes destruction of liver cells and inflammation. What increases the risk? The following factors may make you more likely to develop this condition:  Regularly drinking large amounts of alcohol, especially in a  short amount of time (binge drinking).  Drinking heavily for years.  Being male.  Being obese.  Having had a hepatitis infection in the past.  Having a liver problem that you were born with (genetic liver disease).  Having a lack (deficiency) of certain nutrients, such as folate or thiamine.  Having a parent or sibling who has alcoholic hepatitis. What are the signs or symptoms? Symptoms of this condition include:  Pain and  swelling in the abdomen.  Loss of appetite.  Losing weight without trying.  Nausea and vomiting.  Diarrhea.  Fever.  Fatigue.  Yellowing of the skin and the whites of the eyes (jaundice).  Veins that you can see ("spider veins"), especially in the abdomen.  Bleeding easily, such as excessive bleeding from a minor cut.  Itching.  Trouble thinking clearly.  Memory problems.  Mood changes.  Confusion. How is this diagnosed? This condition may be diagnosed with:  A physical exam and a review of medical history.  Blood tests to check liver function.  Tests that create detailed images of the body. These may include: ? A liver ultrasound. ? CT scan. ? MRI.  A liver biopsy. For this test, a small sample of liver tissue is removed and checked for signs of liver damage. How is this treated? The most important part of treatment is to stop drinking alcohol. If you are addicted to alcohol, your health care provider will help you make a plan to quit. This plan may involve:  Taking medicine to decrease unpleasant symptoms that are caused by stopping or decreasing alcohol use (withdrawal symptoms).  Entering a treatment program to help you stop drinking.  Joining a support group. Treatment for alcoholic hepatitis may also include:  Steroid medicines to reduce inflammation.  Nutritional therapy. Your health care provider or a diet and nutrition specialist (dietitian) may recommend: ? Eating a healthy diet. ? Eating specific foods that contain vitamins and minerals to help you maintain nutrient levels in your body. ? Taking vitamins and dietary supplements to make sure you maintain nutrient levels in your body.  Receiving a donated liver (liver transplant). This is only done in very severe cases, and only for people who have completely stopped drinking and can commit to never drinking alcohol again. Follow these instructions at home:   Do not drink alcohol. Follow your  treatment plan, and work with your health care provider as needed.  Consider joining an alcohol support group. These groups can provide emotional support and guidance.  Take over-the-counter and prescription medicines only as told by your health care provider. These include vitamins and supplements.  Do not use medicines or eat foods that contain alcohol unless told by your health care provider.  Follow instructions from your health care provider or dietitian about nutritional therapy.  Keep all follow-up visits as told by your health care provider. This is important. Contact a health care provider if:  You have a fever.  You have a decreased appetite.  You have flu-like symptoms such as fatigue, weakness, or muscle aches.  You have nausea or vomiting.  You bruise easily.  Your urine is very dark.  You develop new pain in your abdomen. Get help right away if:  You vomit blood.  You develop jaundice.  You have severely itchy skin.  Your legs swell.  Your abdomen suddenly swells.  You have stools that are black, tar-like, or bloody.  You bleed easily, such as excessive bleeding from a minor cut.  You are  confused or not thinking clearly.  You have a seizure. Summary  Alcoholic hepatitis is liver inflammation that is caused by drinking a lot of alcohol over a long period of time.  Alcoholic hepatitis is diagnosed with blood tests that check liver function.  The most important part of treatment is to stop drinking alcohol. Follow your treatment plan, and work with your health care provider as needed. This information is not intended to replace advice given to you by your health care provider. Make sure you discuss any questions you have with your health care provider. Document Revised: 08/05/2018 Document Reviewed: 12/27/2016 Elsevier Patient Education  [place].    Diabetes Mellitus and Nutrition, Adult When you have diabetes (diabetes mellitus), it  is very important to have healthy eating habits because your blood sugar (glucose) levels are greatly affected by what you eat and drink. Eating healthy foods in the appropriate amounts, at about the same times every day, can help you:  Control your blood glucose.  Lower your risk of heart disease.  Improve your blood pressure.  Reach or maintain a healthy weight. Every person with diabetes is different, and each person has different needs for a meal plan. Your health care provider may recommend that you work with a diet and nutrition specialist (dietitian) to make a meal plan that is best for you. Your meal plan may vary depending on factors such as:  The calories you need.  The medicines you take.  Your weight.  Your blood glucose, blood pressure, and cholesterol levels.  Your activity level.  Other health conditions you have, such as heart or kidney disease. How do carbohydrates affect me? Carbohydrates, also called carbs, affect your blood glucose level more than any other type of food. Eating carbs naturally raises the amount of glucose in your blood. Carb counting is a method for keeping track of how many carbs you eat. Counting carbs is important to keep your blood glucose at a healthy level, especially if you use insulin or take certain oral diabetes medicines. It is important to know how many carbs you can safely have in each meal. This is different for every person. Your dietitian can help you calculate how many carbs you should have at each meal and for each snack. Foods that contain carbs include:  Bread, cereal, rice, pasta, and crackers.  Potatoes and corn.  Peas, beans, and lentils.  Milk and yogurt.  Fruit and juice.  Desserts, such as cakes, cookies, ice cream, and candy. How does alcohol affect me? Alcohol can cause a sudden decrease in blood glucose (hypoglycemia), especially if you use insulin or take certain oral diabetes medicines. Hypoglycemia can be a  life-threatening condition. Symptoms of hypoglycemia (sleepiness, dizziness, and confusion) are similar to symptoms of having too much alcohol. If your health care provider says that alcohol is safe for you, follow these guidelines:  Limit alcohol intake to no more than 1 drink per day for nonpregnant women and 2 drinks per day for men. One drink equals 12 oz of beer, 5 oz of wine, or 1 oz of hard liquor.  Do not drink on an empty stomach.  Keep yourself hydrated with water, diet soda, or unsweetened iced tea.  Keep in mind that regular soda, juice, and other mixers may contain a lot of sugar and must be counted as carbs. What are tips for following this plan?  Reading food labels  Start by checking the serving size on the "Nutrition Facts" label of  packaged foods and drinks. The amount of calories, carbs, fats, and other nutrients listed on the label is based on one serving of the item. Many items contain more than one serving per package.  Check the total grams (g) of carbs in one serving. You can calculate the number of servings of carbs in one serving by dividing the total carbs by 15. For example, if a food has 30 g of total carbs, it would be equal to 2 servings of carbs.  Check the number of grams (g) of saturated and trans fats in one serving. Choose foods that have low or no amount of these fats.  Check the number of milligrams (mg) of salt (sodium) in one serving. Most people should limit total sodium intake to less than 2,300 mg per day.  Always check the nutrition information of foods labeled as "low-fat" or "nonfat". These foods may be higher in added sugar or refined carbs and should be avoided.  Talk to your dietitian to identify your daily goals for nutrients listed on the label. Shopping  Avoid buying canned, premade, or processed foods. These foods tend to be high in fat, sodium, and added sugar.  Shop around the outside edge of the grocery store. This includes fresh  fruits and vegetables, bulk grains, fresh meats, and fresh dairy. Cooking  Use low-heat cooking methods, such as baking, instead of high-heat cooking methods like deep frying.  Cook using healthy oils, such as olive, canola, or sunflower oil.  Avoid cooking with butter, cream, or high-fat meats. Meal planning  Eat meals and snacks regularly, preferably at the same times every day. Avoid going long periods of time without eating.  Eat foods high in fiber, such as fresh fruits, vegetables, beans, and whole grains. Talk to your dietitian about how many servings of carbs you can eat at each meal.  Eat 4-6 ounces (oz) of lean protein each day, such as lean meat, chicken, fish, eggs, or tofu. One oz of lean protein is equal to: ? 1 oz of meat, chicken, or fish. ? 1 egg. ?  cup of tofu.  Eat some foods each day that contain healthy fats, such as avocado, nuts, seeds, and fish. Lifestyle  Check your blood glucose regularly.  Exercise regularly as told by your health care provider. This may include: ? 150 minutes of moderate-intensity or vigorous-intensity exercise each week. This could be brisk walking, biking, or water aerobics. ? Stretching and doing strength exercises, such as yoga or weightlifting, at least 2 times a week.  Take medicines as told by your health care provider.  Do not use any products that contain nicotine or tobacco, such as cigarettes and e-cigarettes. If you need help quitting, ask your health care provider.  Work with a Social worker or diabetes educator to identify strategies to manage stress and any emotional and social challenges. Questions to ask a health care provider  Do I need to meet with a diabetes educator?  Do I need to meet with a dietitian?  What number can I call if I have questions?  When are the best times to check my blood glucose? Where to find more information:  American Diabetes Association: diabetes.org  Academy of Nutrition and  Dietetics: www.eatright.[employer] of Diabetes and Digestive and Kidney Diseases (NIH): [employer] Summary  A healthy meal plan will help you control your blood glucose and maintain a healthy lifestyle.  Working with a diet and nutrition specialist (dietitian) can help you make a meal  plan that is best for you.  Keep in mind that carbohydrates (carbs) and alcohol have immediate effects on your blood glucose levels. It is important to count carbs and to use alcohol carefully. This information is not intended to replace advice given to you by your health care provider. Make sure you discuss any questions you have with your health care provider. Document Revised: 03/29/2017 Document Reviewed: 05/21/2016 Elsevier Patient Education  2020 [place].      Diabetes Mellitus and Standards of Medical Care Managing diabetes (diabetes mellitus) can be complicated. Your diabetes treatment may be managed by a team of health care providers, including:  A physician who specializes in diabetes (endocrinologist).  A nurse practitioner or physician assistant.  Nurses.  A diet and nutrition specialist (registered dietitian).  A certified diabetes educator (CDE).  An exercise specialist.  A pharmacist.  An eye doctor.  A foot specialist (podiatrist).  A dentist.  A primary care provider.  A mental health provider. Your health care providers follow guidelines to help you get the best quality of care. The following schedule is a general guideline for your diabetes management plan. Your health care providers may give you more specific instructions. Physical exams Upon being diagnosed with diabetes mellitus, and each year after that, your health care provider will ask about your medical and family history. He or she will also do a physical exam. Your exam may include:  Measuring your height, weight, and body mass index (BMI).  Checking your blood pressure. This will  be done at every routine medical visit. Your target blood pressure may vary depending on your medical conditions, your age, and other factors.  Thyroid gland exam.  Skin exam.  Screening for damage to your nerves (peripheral neuropathy). This may include checking the pulse in your legs and feet and checking the level of sensation in your hands and feet.  A complete foot exam to inspect the structure and skin of your feet, including checking for cuts, bruises, redness, blisters, sores, or other problems.  Screening for blood vessel (vascular) problems, which may include checking the pulse in your legs and feet and checking your temperature. Blood tests Depending on your treatment plan and your personal needs, you may have the following tests done:  HbA1c (hemoglobin A1c). This test provides information about blood sugar (glucose) control over the previous 2-3 months. It is used to adjust your treatment plan, if needed. This test will be done: ? At least 2 times a year, if you are meeting your treatment goals. ? 4 times a year, if you are not meeting your treatment goals or if treatment goals have changed.  Lipid testing, including total, LDL, and HDL cholesterol and triglyceride levels. ? The goal for LDL is less than 100 mg/dL (5.5 mmol/L). If you are at high risk for complications, the goal is less than 70 mg/dL (3.9 mmol/L). ? The goal for HDL is 40 mg/dL (2.2 mmol/L) or higher for men and 50 mg/dL (2.8 mmol/L) or higher for women. An HDL cholesterol of 60 mg/dL (3.3 mmol/L) or higher gives some protection against heart disease. ? The goal for triglycerides is less than 150 mg/dL (8.3 mmol/L).  Liver function tests.  Kidney function tests.  Thyroid function tests. Dental and eye exams  Visit your dentist two times a year.  If you have type 1 diabetes, your health care provider may recommend an eye exam 3-5 years after you are diagnosed, and then once a year after your first  exam. ? For children with type 1 diabetes, a health care provider may recommend an eye exam when your child is age 82 or older and has had diabetes for 3-5 years. After the first exam, your child should get an eye exam once a year.  If you have type 2 diabetes, your health care provider may recommend an eye exam as soon as you are diagnosed, and then once a year after your first exam. Immunizations   The yearly flu (influenza) vaccine is recommended for everyone 6 months or older who has diabetes.  The pneumonia (pneumococcal) vaccine is recommended for everyone 2 years or older who has diabetes. If you are 37 or older, you may get the pneumonia vaccine as a series of two separate shots.  The hepatitis B vaccine is recommended for adults shortly after being diagnosed with diabetes.  Adults and children with diabetes should receive all other vaccines according to age-specific recommendations from the Centers for Disease Control and Prevention (CDC). Mental and emotional health Screening for symptoms of eating disorders, anxiety, and depression is recommended at the time of diagnosis and afterward as needed. If your screening shows that you have symptoms (positive screening result), you may need more evaluation and you may work with a mental health care provider. Treatment plan Your treatment plan will be reviewed at every medical visit. You and your health care provider will discuss:  How you are taking your medicines, including insulin.  Any side effects you are experiencing.  Your blood glucose target goals.  The frequency of your blood glucose monitoring.  Lifestyle habits, such as activity level as well as tobacco, alcohol, and substance use. Diabetes self-management education Your health care provider will assess how well you are monitoring your blood glucose levels and whether you are taking your insulin correctly. He or she may refer you to:  A certified diabetes educator to  manage your diabetes throughout your life, starting at diagnosis.  A registered dietitian who can create or review your personal nutrition plan.  An exercise specialist who can discuss your activity level and exercise plan. Summary  Managing diabetes (diabetes mellitus) can be complicated. Your diabetes treatment may be managed by a team of health care providers.  Your health care providers follow guidelines in order to help you get the best quality of care.  Standards of care including having regular physical exams, blood tests, blood pressure monitoring, immunizations, screening tests, and education about how to manage your diabetes.  Your health care providers may also give you more specific instructions based on your individual health. This information is not intended to replace advice given to you by your health care provider. Make sure you discuss any questions you have with your health care provider. Document Revised: 01/03/2018 Document Reviewed: 01/13/2016 Elsevier Patient Education  [place].

## 2019-06-05 DIAGNOSIS — L409 Psoriasis, unspecified: Secondary | ICD-10-CM | POA: Diagnosis not present

## 2019-06-05 DIAGNOSIS — B360 Pityriasis versicolor: Secondary | ICD-10-CM | POA: Diagnosis not present

## 2019-07-03 ENCOUNTER — Other Ambulatory Visit: Payer: Self-pay | Admitting: Family Medicine

## 2019-07-28 ENCOUNTER — Other Ambulatory Visit: Payer: Self-pay | Admitting: Family Medicine

## 2019-08-12 ENCOUNTER — Ambulatory Visit: Payer: Medicare Other | Admitting: Family Medicine

## 2019-08-13 ENCOUNTER — Ambulatory Visit (INDEPENDENT_AMBULATORY_CARE_PROVIDER_SITE_OTHER): Payer: Medicare Other | Admitting: Family Medicine

## 2019-08-13 ENCOUNTER — Other Ambulatory Visit: Payer: Self-pay

## 2019-08-13 ENCOUNTER — Encounter: Payer: Self-pay | Admitting: Family Medicine

## 2019-08-13 VITALS — BP 131/89 | Ht 68.0 in | Wt 225.0 lb

## 2019-08-13 DIAGNOSIS — E1169 Type 2 diabetes mellitus with other specified complication: Secondary | ICD-10-CM

## 2019-08-13 DIAGNOSIS — E1159 Type 2 diabetes mellitus with other circulatory complications: Secondary | ICD-10-CM | POA: Diagnosis not present

## 2019-08-13 DIAGNOSIS — I152 Hypertension secondary to endocrine disorders: Secondary | ICD-10-CM

## 2019-08-13 DIAGNOSIS — F1019 Alcohol abuse with unspecified alcohol-induced disorder: Secondary | ICD-10-CM

## 2019-08-13 DIAGNOSIS — E1065 Type 1 diabetes mellitus with hyperglycemia: Secondary | ICD-10-CM

## 2019-08-13 DIAGNOSIS — E785 Hyperlipidemia, unspecified: Secondary | ICD-10-CM

## 2019-08-13 DIAGNOSIS — E1049 Type 1 diabetes mellitus with other diabetic neurological complication: Secondary | ICD-10-CM

## 2019-08-13 DIAGNOSIS — Z789 Other specified health status: Secondary | ICD-10-CM

## 2019-08-13 DIAGNOSIS — Z794 Long term (current) use of insulin: Secondary | ICD-10-CM

## 2019-08-13 DIAGNOSIS — IMO0002 Reserved for concepts with insufficient information to code with codable children: Secondary | ICD-10-CM

## 2019-08-13 DIAGNOSIS — H02823 Cysts of right eye, unspecified eyelid: Secondary | ICD-10-CM

## 2019-08-13 DIAGNOSIS — F1010 Alcohol abuse, uncomplicated: Secondary | ICD-10-CM

## 2019-08-13 DIAGNOSIS — F4323 Adjustment disorder with mixed anxiety and depressed mood: Secondary | ICD-10-CM

## 2019-08-13 DIAGNOSIS — I10 Essential (primary) hypertension: Secondary | ICD-10-CM

## 2019-08-13 MED ORDER — OLMESARTAN-AMLODIPINE-HCTZ 40-10-25 MG PO TABS: 1.0000 | ORAL_TABLET | Freq: Every day | ORAL | 0 refills | Status: DC

## 2019-08-13 MED ORDER — LANTUS SOLOSTAR 100 UNIT/ML ~~LOC~~ SOPN: PEN_INJECTOR | SUBCUTANEOUS | 99 refills | Status: DC

## 2019-08-13 MED ORDER — GABAPENTIN 300 MG PO CAPS: 600.0000 mg | ORAL_CAPSULE | Freq: Two times a day (BID) | ORAL | 0 refills | Status: DC

## 2019-08-13 NOTE — Progress Notes (Signed)
Telehealth office visit note for Louis Carney, D.O- at Primary Care at [facility]   I connected with current patient today and verified that I am speaking with the correct person   . Location of the patient: Home . Location of the provider: Office - This visit type was conducted due to national recommendations for restrictions regarding the COVID-19 Pandemic (e.g. social distancing) in an effort to limit this patient's exposure and mitigate transmission in our community.    - No physical exam could be performed with this format, beyond that communicated to [place] by the patient/ family members as noted.   - Additionally my office staff/ schedulers were to discuss with the patient that there may be a monetary charge related to this service, depending on their medical insurance.  My understanding is that patient understood and consented to proceed.     _________________________________________________________________________________   History of Present Illness:  I, Toni Amend, am serving as Education administrator for [employer].  Says he's doing well, "good, maintaining."  - Psoriasis, monitored by Darron Doom to the dermatologist and was supposed to start taking pills for his psoriasis, but couldn't take them because his BP medication interfered with them.  - Cyst on Eyelid Went to an eye doctor for a concern about a growth on his eyelid, and notes he has to see a "different" doctor for treatment.  He was told he needed to go to "not a regular eye doctor, but some other form of eye doctor that can cut it."   The cyst / concern is located on the right side, on the bottom, "like a bubble where I can't see."  Says it makes his vision blurry.  Notes the area of concern is more toward the ear, "like right on the edge."  Confirms a cyst has been growing there and impeding his vision.  "If I squint, it blocks my vision."  If he keeps his eyes open, it's aggravating.  - History of Heavy Alcohol  Use, Recent Sobriety He is currently on his 9th day without a beer.  Hasn't had liquor in six months, and notes "haven't had a Bud Light in nine days."  Says "I'm done done done done."  He's been working hard on cutting back.  He had a couple of beers when he went out to eat recently, and has had some non-alcoholic Coors Light.  Notes gastroenterology told him "alcohol was the main problem with whatever's going on inside me."  Says he's "tempted [by alcohol] all the time," and gaining more weight now because "I'm eating all the time."  His relationship with his family, his kids and wife, has significantly improved.  Notes his relationship with his wife is "almost brand new after fifteen years."  Says "everything is kind of new now that I'm not [drinking]."  Says "now I'm coherent, I can drive, we can go somewhere, we can do this, and do that."  His daughter recently said "you're always there for me, I can always talk to you," and this knowledge is providing him with motivation to continue to engage in prudent lifestyle habits and avoid alcohol.  Says "It's stupid awesome, I can't believe it."  Says he's been drinking for a solid 20+ years.  After his last OV, where his heavy alcohol use was discussed at length, he thought about it for the next couple of days.  Notes "I was like, maybe I do need help, but why do I need help for  something I can control?"  He then endeavored to control the situation on his own.  Says that after it "rattled around in his head for a couple of days," he knew he was strong enough to control the problem and help himself.  HPI:  Hypertension:   -  His blood pressure at home has been running: 131/89 this morning, and notes "it really stays within that."  He went to [employer] and checked it, and "it surprised the heck out of me, it's been staying pretty low with these pills."  - Patient reports good compliance with medication and/or lifestyle modification  - His denies acute  concerns or problems related to treatment plan  - He denies new onset of: chest pain, exercise intolerance, shortness of breath, dizziness, visual changes, headache, lower extremity swelling or claudication.   Last 3 blood pressure readings in our office are as follows: BP Readings from Last 3 Encounters:  08/13/19 131/89  05/28/19 132/81  02/05/19 (!) 176/109   Filed Weights   08/13/19 1002  Weight: 225 lb (102.1 kg)   HPI:   Diabetes Mellitus: Managed by Dr. Cruzita Lederer of Endocrinology.  Home glucose readings: "Completely stupid low."  Notes "it hasn't gone anywhere."  Last night his sugars were 106.  Says "I haven't been out of range in months."  He checks it at different times per day and notes it runs "between 106 and 126."  He continues taking insulin when he wakes up and before bed, but no taking the NovoLog in between.  Takes 15 units in the AM and 15 units in the PM.  He's been taking Lantus and NovoLog and Ozempic.  Says "I'd rather stick with the Ozempic once per week, rather than four shots of the other stuff every day."   - Patient reports good compliance with therapy plan: medication and/or lifestyle modification  - His denies acute concerns or problems related to treatment plan  - He denies new concerns.  Denies polyuria/polydipsia, hypo/ hyperglycemia symptoms.  Denies new onset of: chest pain, exercise intolerance, shortness of breath, dizziness, visual changes, headache, lower extremity swelling or claudication.   Last A1C in the office was:  Lab Results  Component Value Date   HGBA1C 6.0 (A) 01/20/2019   HGBA1C 9.9 (A) 10/27/2018   HGBA1C 10.2 (H) 10/27/2018   Lab Results  Component Value Date   LDLCALC Comment 10/27/2018   CREATININE 0.79 11/06/2018   BP Readings from Last 3 Encounters:  08/13/19 131/89  05/28/19 132/81  02/05/19 (!) 176/109   Wt Readings from Last 3 Encounters:  08/13/19 225 lb (102.1 kg)  05/28/19 232 lb 1.3 oz (105.3 kg)  02/05/19  231 lb (104.8 kg)     GAD 7 : Generalized Anxiety Score 09/09/2018  Nervous, Anxious, on Edge 0  Control/stop worrying 0  Worry too much - different things 0  Trouble relaxing 0  Restless 0  Easily annoyed or irritable 0  Afraid - awful might happen 0  Total GAD 7 Score 0  Anxiety Difficulty Not difficult at all    Depression screen [facility] 2/9 08/13/2019 05/28/2019 02/05/2019 01/15/2019 10/27/2018  Decreased Interest 0 2 1 0 1  Down, Depressed, Hopeless 0 0 0 0 0  PHQ - 2 Score 0 2 1 0 1  Altered sleeping 3 2 2  - 2  Tired, decreased energy 0 1 1 - 1  Change in appetite 0 0 0 - 0  Feeling bad or failure about yourself  0 0 0 -  0  Trouble concentrating 0 2 0 - 0  Moving slowly or fidgety/restless 0 0 0 - 0  Suicidal thoughts 0 0 0 - 0  PHQ-9 Score 3 7 4  - 4  Difficult doing work/chores Not difficult at all Not difficult at all Not difficult at all - -  Some recent data might be hidden      Impression and Recommendations:     1. Type 2 diabetes mellitus with other specified complication, with long-term current use of insulin ([place])   2. Hypertension associated with diabetes ([place])   3. Hyperlipidemia associated with type 2 diabetes mellitus ([place])   4. Alcohol abuse with alcohol-induced disorder ([place])   5. Morbid obesity ([place])   6. Adjustment disorder with mixed anxiety and depressed mood   7. Alcohol abuse   8. Drinks beer- 6-8 beers/d on ave ( 10 Yrs or so)   9. HTN   10. Other diabetic neurological complication associated with type 1 diabetes mellitus ([place])   11. New onset type 1 diabetes mellitus, uncontrolled ([place])   12. Type 1 diabetes mellitus with hyperglycemia (HCC)   13. Eyelid cyst, right      - Last seen for follow-up May 27, 2018.    Eyelid Cyst, Right - Ambulatory referral to ophthalmology replaced today.  See orders. - Will continue to monitor alongside specialist.   Diabetes Mellitus - Discussed need for repeat A1c near future through endocrinology. -  Patient will follow-up as scheduled with Dr. Cruzita Lederer next week (the 23rd), in-person for A1c re-check and any other blood work she feels is necessary.  - Given patient's significant change in carbohydrate intake resulting from his reduction of intake of alcohol, between now and his visit with endocrinology, advised patient to cut his dosage of Lantus in half, to 8 units in AM and 8 units in PM, and report results to Dr. Cruzita Lederer.  - Otherwise, patient will continue current treatment regimen as managed by endocrinology, and abide by any recommendations specialist makes at upcoming appointment.  - Counseled patient on pathophysiology of disease and discussed various treatment options, which always includes dietary and lifestyle modification as first line.    - Strongly encouraged patient to continue to avoid use of alcohol.  Importance of low carb, heart-healthy diet discussed with patient in addition to regular aerobic exercise of 35min 5d/week or more.   - Check FBS and 2 hours after the biggest meal of your day.  Keep log and bring in next OV for my review.     - Also told patient if you ever feel poorly, please check your blood pressure and blood sugar, as one or the other could be the cause of your symptoms.  - We will continue to monitor alongside specialist.   Hypertension - Blood pressure currently is stable, at goal. - Patient will continue current treatment regimen.  See med list.  - Counseled patient on pathophysiology of disease and discussed various treatment options, which always includes dietary and lifestyle modification as first line.   - Lifestyle changes such as dash and heart healthy diets and engaging in a regular exercise program discussed extensively with patient.   - Ambulatory blood pressure monitoring encouraged at least 3 times weekly.  Keep log and bring in every office visit.  Reminded patient that if they ever feel poorly in any way, to check their blood pressure  and pulse.  - We will continue to monitor.   Alcohol Abuse w/ Alcohol-Induced Disorder, Drinks Beer (6-8  beers avg, 10 years or so) - Recently evaluated by Dr. Carlean Purl of GI.  Last OV, advised patient to cut down by one beer every 1-2 weeks to slowly wean down his alcohol intake.  - Patient recently sober, has gone 9 days without a beer, and 6 months without liquor.  - STRONGLY encouraged patient to continue with alcohol sobriety.  - Discussed importance of ongoing prudent lifestyle habits to continue to reduce health risks.  Advised patient that with continued sobriety, his blood pressure and blood sugar should continue to improve.  - Will continue to monitor.   BMI Counseling - Body mass index is 34.21 kg/m, Morbid Obesity Explained to patient what BMI refers to, and what it means medically.    Told patient to think about it as a "medical risk stratification measurement" and how increasing BMI is associated with increasing risk/ or worsening state of various diseases such as hypertension, hyperlipidemia, diabetes, premature OA, depression etc.  American Heart Association guidelines for healthy diet, basically Mediterranean diet, and exercise guidelines of 30 minutes 5 days per week or more discussed in detail.  Health counseling performed.  All questions answered.   Health Counseling & Preventative Maintenance - Advised patient to continue working toward exercising to improve overall mental, physical, and emotional health.    - Reviewed the "spokes of the wheel" of mood and health management.  Stressed the importance of ongoing prudent habits, including regular exercise, appropriate sleep hygiene, healthful dietary habits, and prayer/meditation to relax.  - Encouraged patient to engage in daily physical activity as tolerated, especially a formal exercise routine.  Recommended that the patient eventually strive for at least 150 minutes of moderate cardiovascular activity per week  according to guidelines established by the [facility].   - Healthy dietary habits encouraged, including low-carb, and high amounts of lean protein in diet.   - Patient should also consume adequate amounts of water.   Recommendations - Return for CPE in 3-4 months with full fasting lab work same day.    - As part of my medical decision making, I reviewed the following data within the [place] History obtained from pt /family, CMA notes reviewed and incorporated if applicable, Labs reviewed, Radiograph/ tests reviewed if applicable and OV notes from prior OV's with me, as well as other specialists she/he has seen since seeing me last, were all reviewed and used in my medical decision making process today.    - Additionally, when appropriate, discussion had with patient regarding our treatment plan, and their biases/concerns about that plan were used in my medical decision making today.    - The patient agreed with the plan and demonstrated an understanding of the instructions.   No barriers to understanding were identified.     - The patient was advised to call back or seek an in-person evaluation if the symptoms worsen or if the condition fails to improve as anticipated.   Return for CPE and full fasting lab work same day in 3-4 months.    Orders Placed This Encounter  Procedures  . Ambulatory referral to Ophthalmology    Meds ordered this encounter  Medications  . Olmesartan-amLODIPine-HCTZ 40-10-25 MG TABS    Sig: Take 1 tablet by mouth daily. 1QD    Dispense:  90 tablet    Refill:  0  . insulin glargine (LANTUS SOLOSTAR) 100 UNIT/ML Solostar Pen    Sig: Inject 8 units into the skin twice daily-12 hours apart.    Dispense:  5 pen    Refill:  PRN  . gabapentin (NEURONTIN) 300 MG capsule    Sig: Take 2 capsules (600 mg total) by mouth 2 (two) times daily.    Dispense:  360 capsule    Refill:  0    Medications Discontinued During This Encounter  Medication  Reason  . Olmesartan-amLODIPine-HCTZ 40-10-25 MG TABS Reorder  . Insulin Glargine (LANTUS SOLOSTAR) 100 UNIT/ML Solostar Pen   . gabapentin (NEURONTIN) 300 MG capsule Reorder       Time spent on visit including pre-visit chart review and post-visit care was 22 minutes.    Note:  This note was prepared with assistance of Dragon voice recognition software. Occasional wrong-word or sound-a-like substitutions may have occurred due to the inherent limitations of voice recognition software.  The [place] was signed into law in 2016 which includes the topic of electronic health records.  This provides immediate access to information in MyChart.  This includes consultation notes, operative notes, office notes, lab results and pathology reports.  If you have any questions about what you read please let us know at your next visit or call us at the office.  We are right here with you.  This document serves as a record of services personally performed by Louis Dance, DO. It was created on her behalf by Toni Amend, a trained medical scribe. The creation of this record is based on the scribe's personal observations and the provider's statements to them.    __________________________________________________________________________________     Patient Care Team    Relationship Specialty Notifications Start End  Louis Dance, DO PCP - General Family Medicine  08/26/17   Arlyss Gandy, PA-C  Dermatology  07/31/18    Comment: Rocco Serene, MD Consulting Physician Neurosurgery  07/31/18   Thayer Headings, MD Consulting Physician Infectious Diseases  07/31/18   Philemon Kingdom, MD Consulting Physician Endocrinology  12/25/18      -Vitals obtained; medications/ allergies reconciled;  personal medical, social, Sx etc.histories were updated by CMA, reviewed by me and are reflected in chart   Patient Active Problem List   Diagnosis Date Noted  . Alcohol  abuse with alcohol-induced disorder ([place]) 02/07/2019  . Hyperlipidemia associated with type 2 diabetes mellitus ([place]) 09/12/2017  . Cigarette smoker- 3 ppd for 6 yrs, then 1ppd for 9 yrs- 27 pk yr hx 08/26/2017  . Hypertension associated with diabetes ([place]) 08/26/2017  . Adjustment disorder with mixed anxiety and depressed mood 02/05/2019  . Hypertriglyceridemia 09/12/2017  . 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 RSD (reflex sympathetic dystrophy)- right entire leg painful to non-noxious stimuli 08/26/2017  . Drinks beer- 6-8 beers/d on ave ( 10 Yrs or so) 08/26/2017  . NSAID long-term use-4 ibuprofen 3 times daily times at least 5 years 08/26/2017  . 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 Mood disorder ([place])- secondary to chronic pain syndrome 08/26/2017  . Insomnia 08/26/2017  . Elevated liver function tests 11/07/2018  . Family history of diabetes mellitus in father 09/12/2017  . Vitamin D deficiency 09/12/2017  . Psoriasis-onset age 67 has needed Cosentyx in past has failed Enbrel and Humira 08/26/2017  . ED (erectile dysfunction) 08/26/2017  . Diabetes mellitus ([place]) 05/28/2019  . Morbid obesity ([place]) 05/28/2019  . Alcoholic hepatitis without ascites 05/28/2019  . Sprain of calcaneofibular ligament of ankle, initial encounter- L-  pt refused xrays of ankle 05/28/2019  . Hepatitis  02/05/2019  . Hepatocellular damage 11/07/2018  . Nausea 11/07/2018  . Alcohol abuse 09/09/2018  . HNP (herniated nucleus pulposus), lumbar 03/07/2018  . Transaminitis 01/29/2018  . Medication monitoring encounter 11/29/2017  . TB lung, latent 11/06/2017  . High risk medication use 09/12/2017  . Low level of high density lipoprotein (HDL) 09/12/2017  . Chronic radicular low back pain- R sided 08/26/2017  . History of excision of lamina of cervical vertebra for decompression of sp- C4-6inal cord  08/26/2017  . Chronic fatigue 08/26/2017  . Environmental and seasonal allergies 08/26/2017  . Allergic conjunctivitis of both eyes and rhinitis 08/26/2017  . Class 1 obesity 08/26/2017  . Plaque psoriasis 08/26/2017     Current Meds  Medication Sig  . esomeprazole (NEXIUM) 20 MG capsule Take 20 mg by mouth daily at 12 noon.  . gabapentin (NEURONTIN) 300 MG capsule Take 2 capsules (600 mg total) by mouth 2 (two) times daily.  . insulin glargine (LANTUS SOLOSTAR) 100 UNIT/ML Solostar Pen Inject 8 units into the skin twice daily-12 hours apart.  . Olmesartan-amLODIPine-HCTZ 40-10-25 MG TABS Take 1 tablet by mouth daily. 1QD  . Risankizumab-rzaa,150 MG Dose, (SKYRIZI, 150 MG DOSE,) 75 MG/0.83ML PSKT Inject 300 mg into the skin every 3 (three) months. 150 mg (1 injection) in each leg every 3 months  . Semaglutide,0.25 or 0.5MG /DOS, (OZEMPIC, 0.25 OR 0.5 MG/DOSE,) 2 MG/1.5ML SOPN Inject 0.5 mg into the skin once a week.  . sildenafil (VIAGRA) 100 MG tablet TAKE ONE-HALF TO ONE TABLET BY MOUTH 30 MINUTES PRIOR TO INTERCOURSE AS NEEDED  . [DISCONTINUED] gabapentin (NEURONTIN) 300 MG capsule Take 2 capsules by mouth 2 (two) times daily.  . [DISCONTINUED] Insulin Glargine (LANTUS SOLOSTAR) 100 UNIT/ML Solostar Pen Inject 15 units into the skin twice daily-12 hours apart.  . [DISCONTINUED] Olmesartan-amLODIPine-HCTZ 40-10-25 MG TABS Take 1 tablet by mouth daily. 1QD     Allergies:  No Known Allergies   ROS:  See above HPI for pertinent positives and negatives   Objective:   Blood pressure 131/89, height 5\' 8"  (1.727 m), weight 225 lb (102.1 kg).  (if some vitals are omitted, this means that patient was UNABLE to obtain them even though they were asked to get them prior to OV today.  They were asked to call us at their earliest convenience with these once obtained. ) General: A & O * 3; sounds in no acute distress; in usual state of health.  Skin: Pt confirms warm and dry extremities and  pink fingertips HEENT: Pt confirms lips non-cyanotic Chest: Patient confirms normal chest excursion and movement Respiratory: speaking in full sentences, no conversational dyspnea; patient confirms no use of accessory muscles Psych: insight appears good, mood- appears full

## 2019-08-19 ENCOUNTER — Other Ambulatory Visit: Payer: Self-pay

## 2019-08-21 ENCOUNTER — Other Ambulatory Visit: Payer: Self-pay

## 2019-08-21 ENCOUNTER — Ambulatory Visit (INDEPENDENT_AMBULATORY_CARE_PROVIDER_SITE_OTHER): Payer: Medicare Other | Admitting: Internal Medicine

## 2019-08-21 ENCOUNTER — Encounter: Payer: Self-pay | Admitting: Internal Medicine

## 2019-08-21 VITALS — BP 128/88 | HR 86 | Ht 68.0 in | Wt 232.0 lb

## 2019-08-21 DIAGNOSIS — E781 Pure hyperglyceridemia: Secondary | ICD-10-CM | POA: Diagnosis not present

## 2019-08-21 DIAGNOSIS — E669 Obesity, unspecified: Secondary | ICD-10-CM | POA: Diagnosis not present

## 2019-08-21 DIAGNOSIS — Z794 Long term (current) use of insulin: Secondary | ICD-10-CM | POA: Diagnosis not present

## 2019-08-21 DIAGNOSIS — E1165 Type 2 diabetes mellitus with hyperglycemia: Secondary | ICD-10-CM | POA: Diagnosis not present

## 2019-08-21 LAB — POCT GLYCOSYLATED HEMOGLOBIN (HGB A1C): Hemoglobin A1C: 6.2 % — AB (ref 4.0–5.6)

## 2019-08-21 NOTE — Addendum Note (Signed)
Addended by: Cardell Peach I on: 08/21/2019 01:28 PM   Modules accepted: Orders

## 2019-08-21 NOTE — Progress Notes (Signed)
Patient ID: Louis Carney, male   DOB: 02-29-1981, 39 y.o.   MRN: [id number]   This visit occurred during the SARS-CoV-2 public health emergency.  Safety protocols were in place, including screening questions prior to the visit, additional usage of staff PPE, and extensive cleaning of exam room while observing appropriate contact time as indicated for disinfecting solutions.   HPI: Louis Carney is a 39 y.o.-year-old male, initially referred by his PCP, Dr. Raliegh Scarlet, returning for follow-up for DM2, dx in 09/2018, insulin-dependent, uncontrolled, without long-term complications.  Last visit 3 months ago (virtual).  Reviewed history: He had dizziness, blurry vision >> checked sugar with neighbor's meter >> CBG high (HI, then rechecked: 540).  He saw PCP >> HbA1c was high x2 >> added insulin.  He felt much better after adding insulin.  However, sugars were still extremely high at last visit so we added rapid acting insulin and I made specific suggestions about his diet and alcohol intake.  Reviewed HbA1c levels: Lab Results  Component Value Date   HGBA1C 6.0 (A) 01/20/2019   HGBA1C 9.9 (A) 10/27/2018   HGBA1C 10.2 (H) 10/27/2018   HGBA1C 5.6 03/06/2018   HGBA1C 5.3 08/26/2017   At last visit he was on: - Lantus 25 units 2x a day >> 24 units 2x a day - Novolog 12-14 units 15 min before lunch and dinner - added 10/2018  I advised him to change to: - Ozempic 0.5 mg weekly - Lantus 25 units 2x a day - Novolog 8-14 units 15 min before lunch and dinner >> 14 units 1x a day  Pt checks his sugars once a day: - am: 202-300 >> 105-132 >> 107-140 >> 97-126 - 2h after b'fast: n/c - before lunch: 300-340 >> <130 >> 140s >> 97-126 - 2h after lunch: n/c - before dinner: 340 >> 128-132 >> n/c - 2h after dinner: n/c - bedtime: 340 >> 130s >> up to 189 >> 106-126 - nighttime: n/c Lowest sugar was 202 >> 105 >> 107 >> 97; it is unclear at which CBG level he has hypoglycemia awareness. Highest sugar  was 460 >> 141 >> 189 >> 126.  Glucometer:ReliOn  Pt's meals are: - Breakfast: skips - Lunch: [place] sandwich  - Dinner: lasagna, hamburger, steak + potatoes, pasta - Snacks:cookies, chips. He does not like greens. In the past, he was drinking 4-6 shots of vodka, 6 pack beer EVERY night >> then 5 Bud Light beers a night >> then liquor/vodka over Christmas >> stopped before last visit.  -No CKD.  Last BUN/creatinine:  Lab Results  Component Value Date   BUN 8 11/06/2018   BUN 25 (H) 10/27/2018   CREATININE 0.79 11/06/2018   CREATININE 1.06 10/27/2018  On olmesartan.  -He had significant hypertriglyceridemia, which improved at last check; last set of lipids: Lab Results  Component Value Date   CHOL 267 (H) 01/20/2019   HDL 32.90 (L) 01/20/2019   LDLCALC Comment 10/27/2018   LDLDIRECT 184.0 01/20/2019   TRIG 386.0 (H) 01/20/2019   CHOLHDL 8 01/20/2019    He had high LFTs: Lab Results  Component Value Date   ALT 102 (H) 01/20/2019   AST 119 (H) 01/20/2019   ALKPHOS 50 01/20/2019   BILITOT 0.7 01/20/2019  He sees Dr. Carlean Purl with [facility].    - last eye exam was in 12/2018: No DR  -+ numbness and tingling in his R leg - from back pain - see Dr. Cyndy Freeze.  Pt has FH of DM in  father - type 2 DM. + significant alcoholism FH.  He is on Risankizumab in 04/2018 for psoriasis >> working. Prev. Embrel, Humira, etc.  No history of pancreatitis or personal or family history of medullary thyroid cancer.  ROS: Constitutional: + weight gain/no weight loss, no fatigue, no subjective hyperthermia, no subjective hypothermia Eyes: no blurry vision, no xerophthalmia ENT: no sore throat, no nodules palpated in neck, no dysphagia, no odynophagia, no hoarseness Cardiovascular: no CP/no SOB/no palpitations/no leg swelling Respiratory: no cough/no SOB/no wheezing Gastrointestinal: no N/no V/no D/no C/no acid reflux Musculoskeletal: no muscle aches/+ joint aches - back pain Skin: + Rash  on hands (psoriasis), no hair loss Neurological: no tremors/no numbness/no tingling/no dizziness  I reviewed pt's medications, allergies, PMH, social hx, family hx, and changes were documented in the history of present illness. Otherwise, unchanged from my initial visit note.  Past Medical History:  Diagnosis Date  . Arthritis   . Diabetes ([place])    type 1  . GERD (gastroesophageal reflux disease)   . Headache    migraines  . Hypertension   . Psoriasis    since age 37 years  . Tuberculosis    latent TB infection on treatment   Past Surgical History:  Procedure Laterality Date  . CARPAL TUNNEL RELEASE Right 01/15/2018   Procedure: Right Carpal tunnel release;  Surgeon: Ashok Pall, MD;  Location: [facility];  Service: Neurosurgery;  Laterality: Right;  Right Carpal tunnel release  . CERVICAL SPINE SURGERY    . LUMBAR LAMINECTOMY/DECOMPRESSION MICRODISCECTOMY Right 03/07/2018   Procedure: Right Lumbar 5 Sacral 1 Microdiscectomy;  Surgeon: Ashok Pall, MD;  Location: [facility];  Service: Neurosurgery;  Laterality: Right;  Right Lumbar 5 Sacral 1 Microdiscectomy  . NECK SURGERY     Social History   Socioeconomic History  . Marital status: Married    Spouse name: Not on file  . Number of children: 5  . Years of education: Not on file  . Highest education level: Not on file  Occupational History  . Occupation: disabled  Tobacco Use  . Smoking status: Current Every Day Smoker    Packs/day: 1.00    Years: 15.00    Pack years: 15.00    Types: Cigarettes  . Smokeless tobacco: Never Used  Substance and Sexual Activity  . Alcohol use: Yes    Alcohol/week: 24.0 standard drinks    Types: 24 Standard drinks or equivalent per week  . Drug use: Never  . Sexual activity: Yes    Partners: Female    Birth control/protection: None  Other Topics Concern  . 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 Difficulty of Paying Living Expenses:   Food Insecurity:   . Worried About Charity fundraiser in the Last Year:   . Arboriculturist in the Last Year:   Transportation Needs:   . Film/video editor (Medical):   Marland Kitchen Lack of Transportation (Non-Medical):   Physical Activity:   . Days of Exercise per Week:   . Minutes of Exercise per Session:   Stress:   . Feeling of Stress :   Social Connections:   . Frequency of  Communication with Friends and Family:   . Frequency of Social Gatherings with Friends and Family:   . Attends Religious Services:   . Active Member of Clubs or Organizations:   . Attends Archivist Meetings:   Marland Kitchen Marital Status:   Intimate Partner Violence:   . Fear of Current or Ex-Partner:   . Emotionally Abused:   Marland Kitchen Physically Abused:   . Sexually Abused:    Current Outpatient Medications on File Prior to Visit  Medication Sig Dispense Refill  . blood glucose meter kit and supplies Dispense based on patient and insurance preference. Use to check glucose level three times daily before meals and 3 times daily 2 hours after meals (6 times daily total). (FOR ICD-10 E10.9, E11.9). 1 each 0  . esomeprazole (NEXIUM) 20 MG capsule Take 20 mg by mouth daily at 12 noon.    . gabapentin (NEURONTIN) 300 MG capsule Take 2 capsules (600 mg total) by mouth 2 (two) times daily. 360 capsule 0  . ibuprofen (ADVIL) 200 MG tablet Take 800 mg by mouth 2 (two) times daily.    . insulin aspart (NOVOLOG FLEXPEN) 100 UNIT/ML FlexPen Inject 10-14 Units into the skin 3 (three) times daily with meals. 15 mL 11  . insulin glargine (LANTUS SOLOSTAR) 100 UNIT/ML Solostar Pen Inject 8 units into the skin twice daily-12 hours apart. 5 pen PRN  . Olmesartan-amLODIPine-HCTZ 40-10-25 MG TABS Take 1 tablet by  mouth daily. 1QD 90 tablet 0  . OneTouch Delica Lancets 29J MISC USE TO TEST BLOOD SUGARS 6 TIMES DAILY (BEFORE MEALS AND 2 HOURS AFTER MEALS) 200 each 0  . OneTouch Delica Lancets 24Q MISC USE 1  TO CHECK GLUCOSE SIX TIMES DAILY (BEFORE  MEALS  AND  TWO  HOURS  AFTER  MEALS) 200 each 0  . ONETOUCH VERIO test strip USE TO TEST 6 TIMES DAILY (BEFORE MEALS AND 2 HOURS AFTER MEALS) 200 each 0  . Risankizumab-rzaa,150 MG Dose, (SKYRIZI, 150 MG DOSE,) 75 MG/0.83ML PSKT Inject 300 mg into the skin every 3 (three) months. 150 mg (1 injection) in each leg every 3 months    . Semaglutide,0.25 or 0.5MG/DOS, (OZEMPIC, 0.25 OR 0.5 MG/DOSE,) 2 MG/1.5ML SOPN Inject 0.5 mg into the skin once a week. 2 pen 5  . sildenafil (VIAGRA) 100 MG tablet TAKE ONE-HALF TO ONE TABLET BY MOUTH 30 MINUTES PRIOR TO INTERCOURSE AS NEEDED 30 tablet 0   No current facility-administered medications on file prior to visit.   No Known Allergies Family History  Problem Relation Age of Onset  . Alcohol abuse Mother   . Hypertension Mother   . Lung cancer Mother   . Alcohol abuse Father   . Hypertension Father   . Colon cancer Father        dx in his late 66's  . Esophageal cancer Neg Hx   . Rectal cancer Neg Hx     PE:  BP 128/88   Pulse 86   Ht '5\' 8"'  (1.727 m)   Wt 232 lb (105.2 kg)   SpO2 99%   BMI 35.28 kg/m  Wt Readings from Last 3 Encounters:  08/21/19 232 lb (105.2 kg)  08/13/19 225 lb (102.1 kg)  05/28/19 232 lb 1.3 oz (105.3 kg)   Constitutional: overweight, in NAD Eyes: PERRLA, EOMI, no exophthalmos ENT: moist mucous membranes, no thyromegaly, no cervical lymphadenopathy Cardiovascular: RRR, No MRG Respiratory: CTA B Gastrointestinal: abdomen soft, NT, ND, BS+ Musculoskeletal: no deformities, strength intact in all  4 Skin: moist, warm, no rashes Neurological: no tremor with outstretched hands, DTR normal in all 4 ASSESSMENT: 1. DM, insulin-dependent, without long-term complications  2. HTG  3.   Obesity class I  PLAN:  1. Patient with a relatively recent diagnosis of diabetes, insulin-dependent with sugars improved significantly after he improved his diet.  He is starting to cut out fast foods, starches, snacks.  Sugars improved afterwards, however, at last visit, he restarted to drink alcohol and relaxed his diet so sugars started to increase.  At that time, I strongly encouraged him to stay off alcohol.  We also started Ozempic and decrease his NovoLog doses. -Of note, we ruled out type 1 diabetes in the past. -He could not afford a CGM. -At this visit, sugars are excellent throughout the day.  He forgot his meter but per his report he does not have any high or low blood sugars.  In fact, he is not taking his NovoLog consistently, only approximately once a day, and takes 14 units mostly when he eats a larger meal or goes out to eat.  At this visit we discussed about decreasing the dose to 8 to 10 units and to continue to use it only with a large meal. -At next visit, if sugars remain controlled, I am planning to start decreasing his Lantus doses. - I suggested to:  Patient Instructions  Please continue: - Ozempic 0.5 mg weekly - Lantus 25 units 2x a day  Please use: - Novolog 8-10 units 15 min before a large meal  Please return in 3 months with your sugar log.   - we checked his HbA1c: 6.2% (slightly higher) - advised to check sugars at different times of the day - 3x a day, rotating check times - advised for yearly eye exams >> he is UTD - return to clinic in 3-4 months  2. HTG -Reviewed latest lipid panel from 12/2018: LDL increased, as were his triglycerides.  HDL low: Lab Results  Component Value Date   CHOL 267 (H) 01/20/2019   HDL 32.90 (L) 01/20/2019   LDLCALC Comment 10/27/2018   LDLDIRECT 184.0 01/20/2019   TRIG 386.0 (H) 01/20/2019   CHOLHDL 8 01/20/2019  -At last visit we discussed about stopping alcohol   3.  Obesity class I -Before last visit, he relaxed  his diet over the holidays and restarted to drink liquor.  She stopped drinking just before last visit. -He continues to adjust his diet by cutting out starches, fast foods, and snacks -We again discussed about his diet.  He is snacking constantly between meals (crackers, cheese, etc.).  I advised him to stick only with his meals and not snack at all between meals, but if he has to do so, to only snack on fruit. -We will continue the GLP-1 receptor agonist, which should also help with weight loss.  The next visit, we may increase the dose to 1 mg weekly.  He is tolerating it well. - he gained 7 lbs since last OV  Philemon Kingdom, MD PhD [facility] Endocrinology

## 2019-08-21 NOTE — Patient Instructions (Signed)
Please continue: - Ozempic 0.5 mg weekly - Lantus 25 units 2x a day  Please use: - Novolog 8-10 units 15 min before a large meal  Please return in 3 months with your sugar log.

## 2019-08-31 ENCOUNTER — Other Ambulatory Visit: Payer: Self-pay | Admitting: Family Medicine

## 2019-08-31 IMAGING — US ULTRASOUND ABDOMEN COMPLETE
1 series · 13 of 25 positions shown · non-contrast
Comparison: None.

CLINICAL DATA: Elevated LFTs, nausea, alcohol abuse

EXAM:
ABDOMEN ULTRASOUND COMPLETE

[Series 1: ultrasound abdomen complete · 0.23mm/px · 13 of 123 slices shown]
[im 1/123]
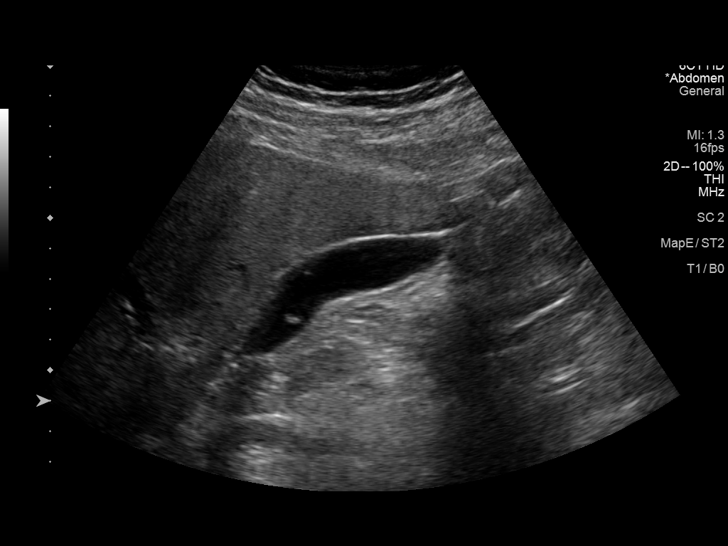
[im 11/123]
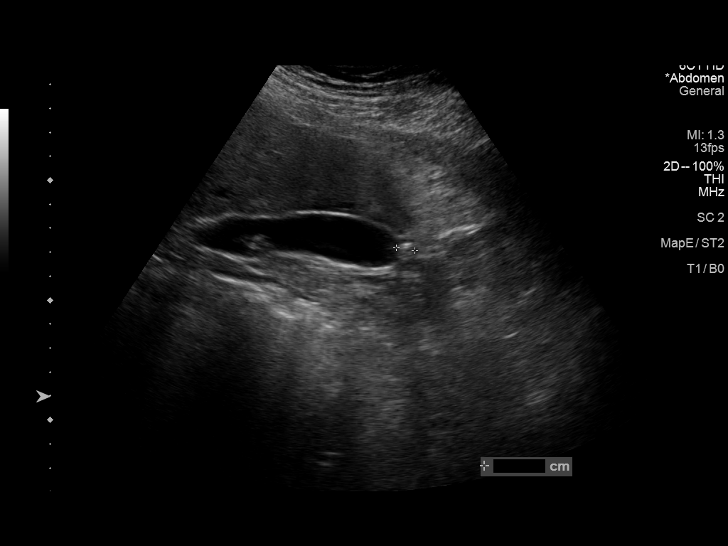
[im 21/123]
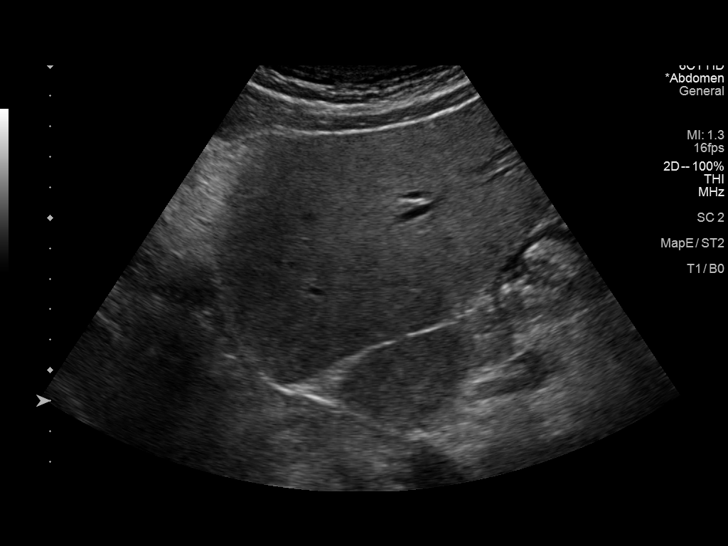
[im 31/123]
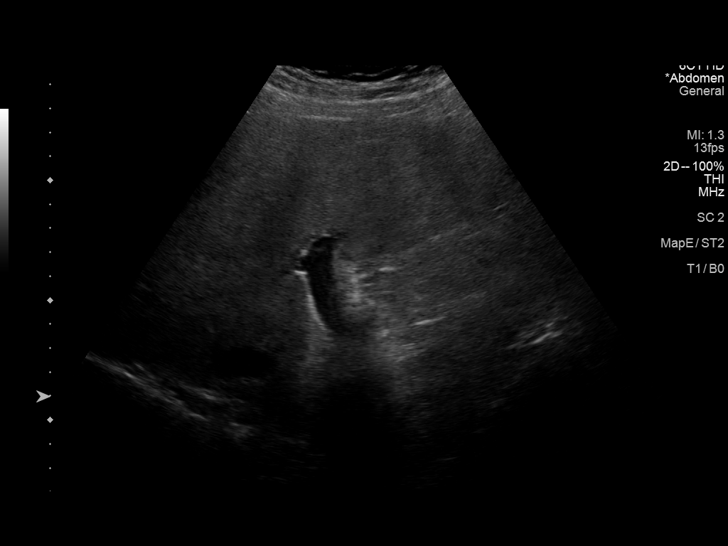
[im 41/123]
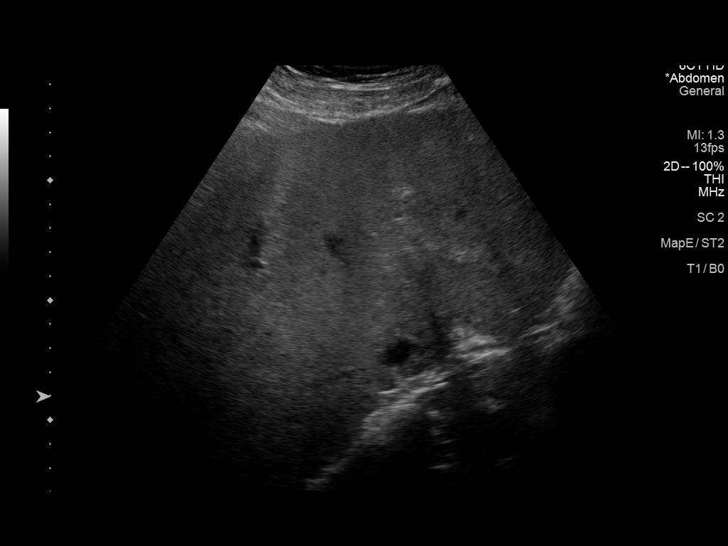
[im 51/123]
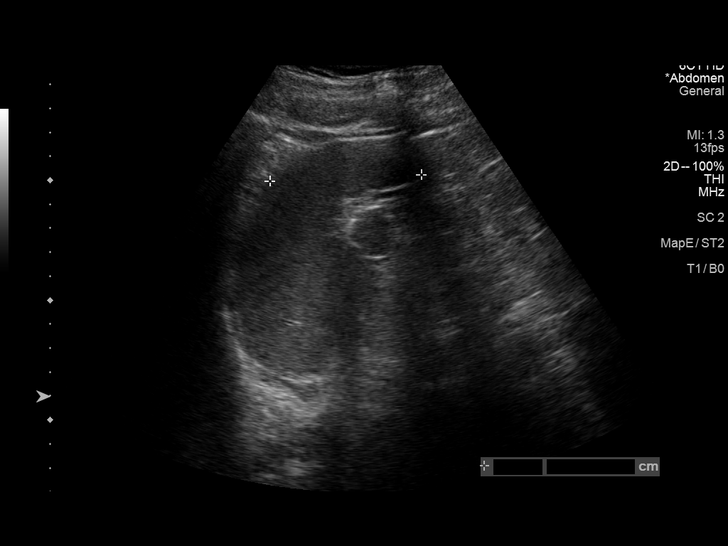
[im 62/123]
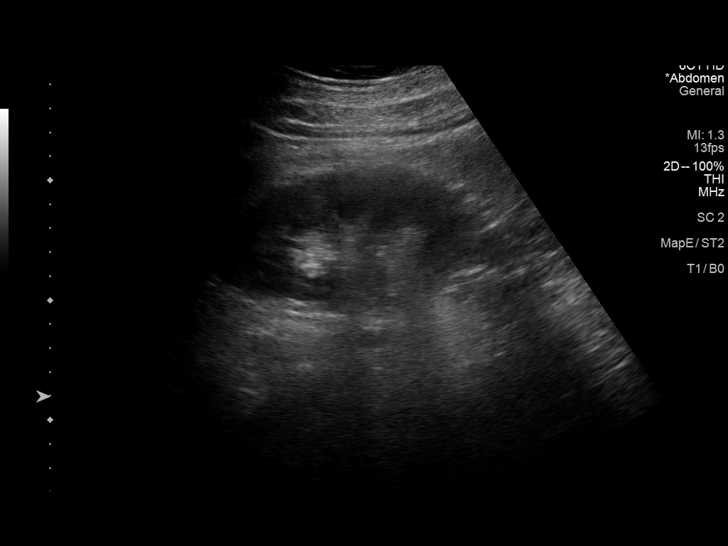
[im 72/123]
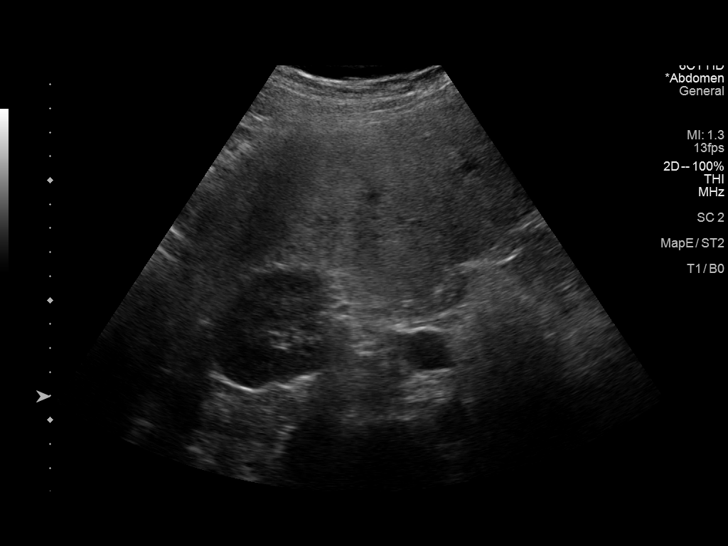
[im 82/123]
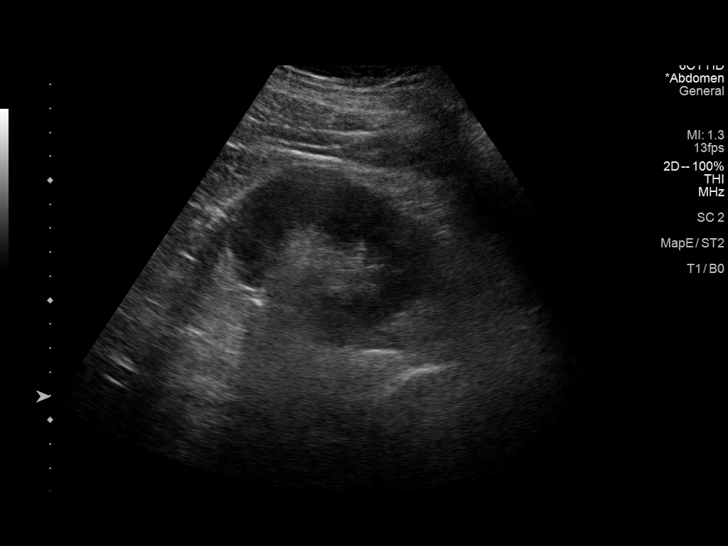
[im 92/123]
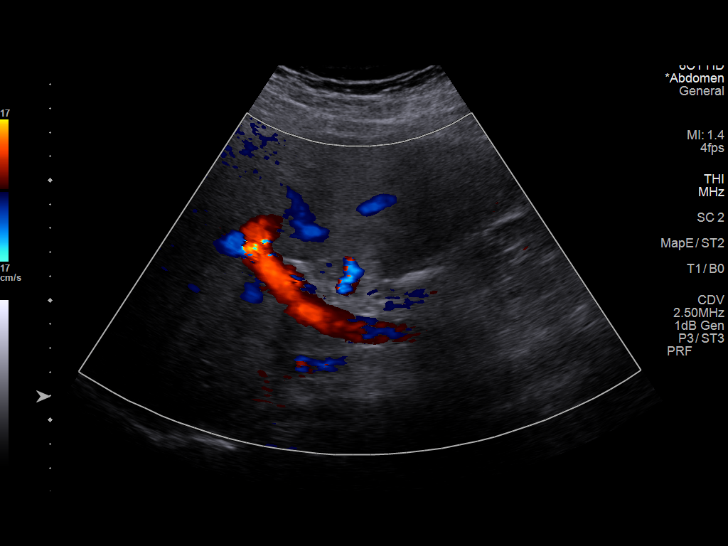
[im 102/123]
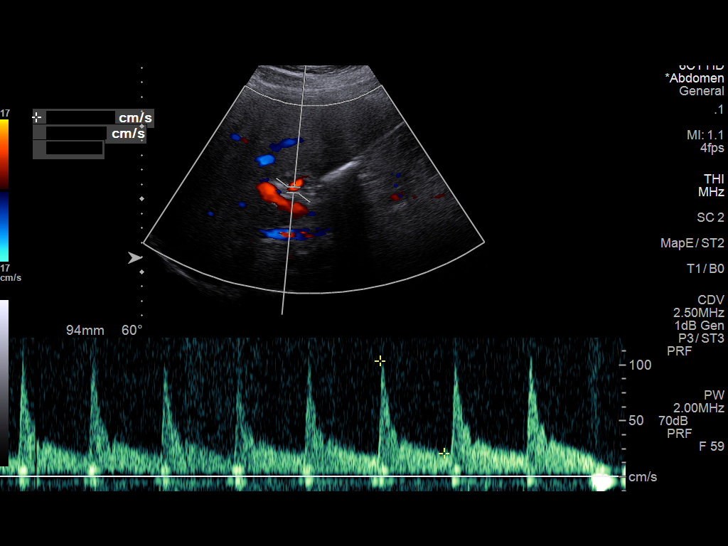
[im 112/123]
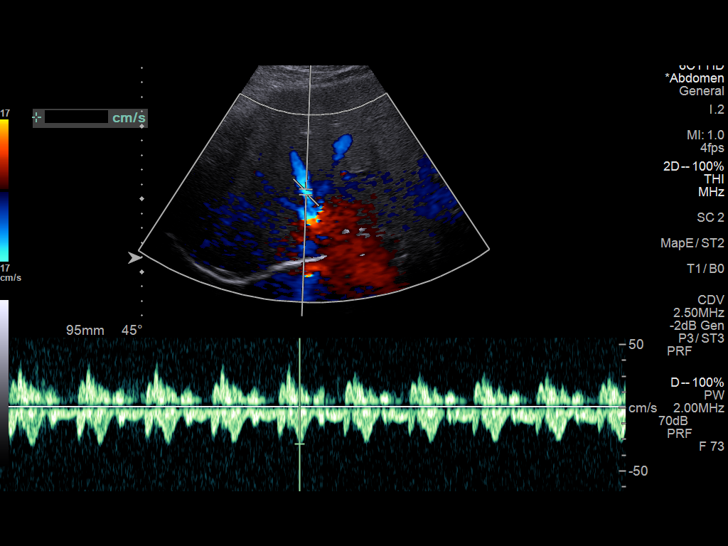
[im 123/123]
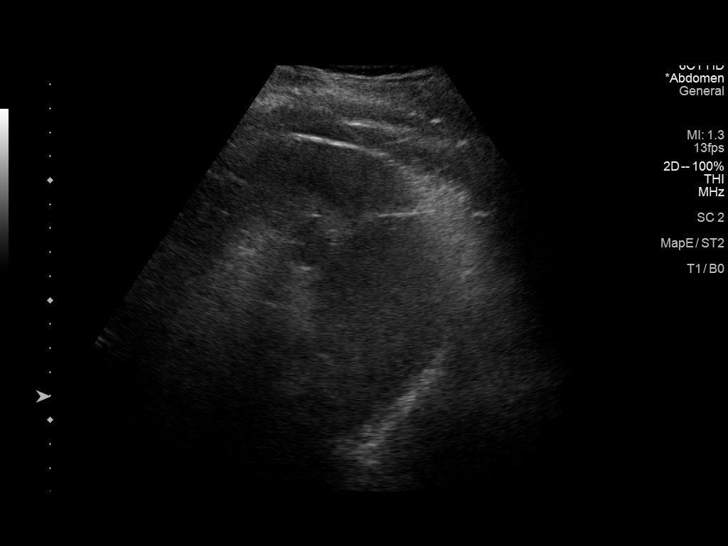

[13 of 25 positions shown; findings below may reference images not displayed]

FINDINGS: Gallbladder: Multiple small echogenic nonshadowing gallbladder
polyps noted along the gallbladder wall. No definite shadowing
gallstones. Normal wall thickness measuring 3 mm. No Murphy's sign
or pericholecystic fluid. No signs of cholecystitis.

Common bile duct: Diameter: 5 mm

Liver: Increased hepatic echogenicity compatible with hepatic
steatosis. No intrahepatic biliary dilatation. No large focal
hepatic abnormality. Portal vein is patent on color Doppler imaging
with normal direction of blood flow towards the liver.

IVC: No abnormality visualized.

Pancreas: Visualized portion unremarkable.

Spleen: Normal in size and appearance. Accessory splenule in the
hilum.

Right Kidney: Length: 12 cm. Normal echogenicity. No hydronephrosis
acute finding. Cortical thickness. 5 mm echogenic shadowing calculus
in the right kidney midpole.

Left Kidney: Length: 12.8 cm. Echogenicity within normal limits. No
mass or hydronephrosis visualized.

Abdominal aorta: No aneurysm visualized.

Other findings: 2.5 cm diameter.  Atherosclerotic changes noted.
IMPRESSION: Incidental gallbladder polyps without acute finding by ultrasound.

Hepatic steatosis

Nonobstructing right nephrolithiasis

Aortic atherosclerosis without aneurysm

## 2019-08-31 IMAGING — US US HEPATIC LIVER DOPPLER
1 series · 13 of 25 positions shown · non-contrast
Comparison: None.

CLINICAL DATA: 38-year-old male with a history of alcohol abuse,
hepatic steatosis and elevated LFTs as well as new onset diabetes.

EXAM:
DUPLEX ULTRASOUND OF LIVER
TECHNIQUE: Color and duplex Doppler ultrasound was performed to evaluate the
hepatic in-flow and out-flow vessels.

[Series 1: us hepatic liver doppler · 0.23mm/px · 13 of 123 slices shown]
[im 1/123]
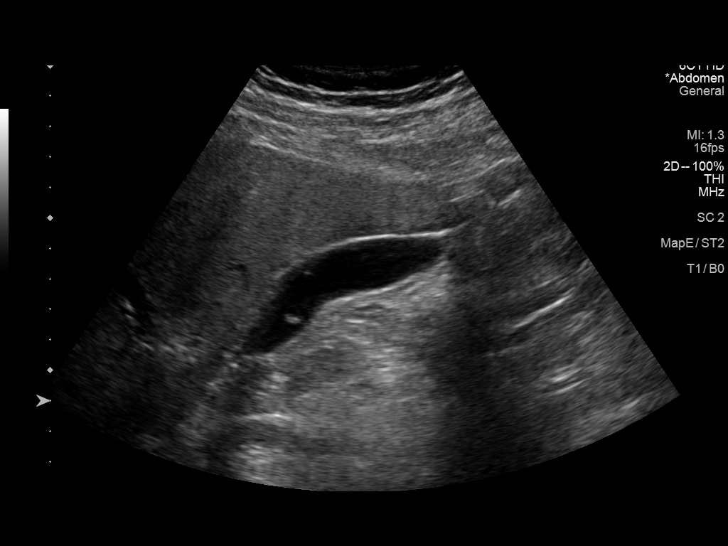
[im 11/123]
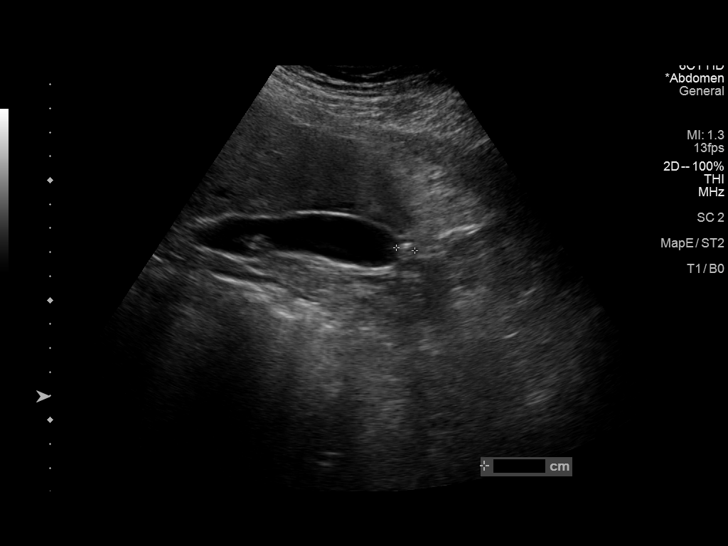
[im 21/123]
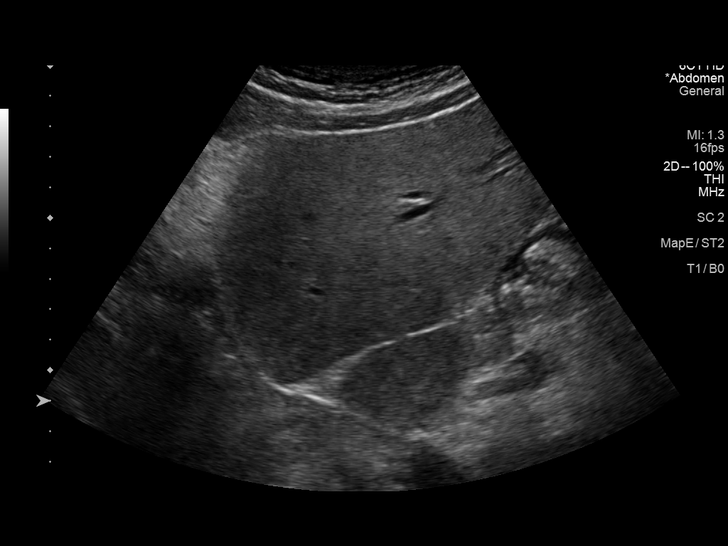
[im 31/123]
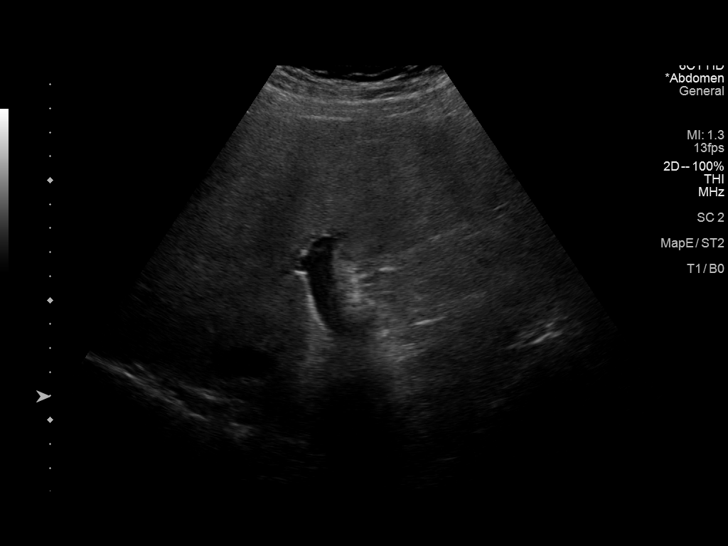
[im 41/123]
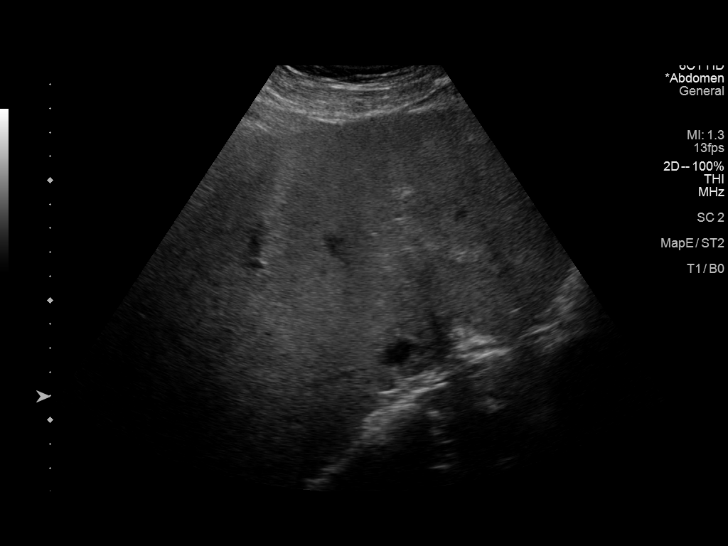
[im 51/123]
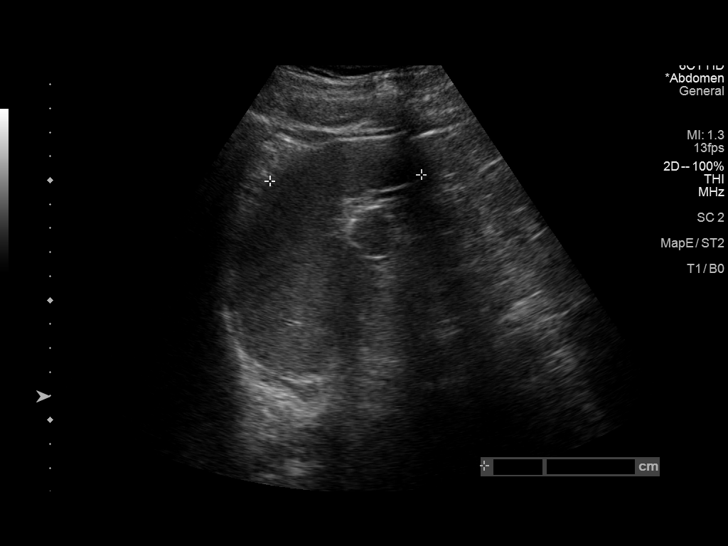
[im 62/123]
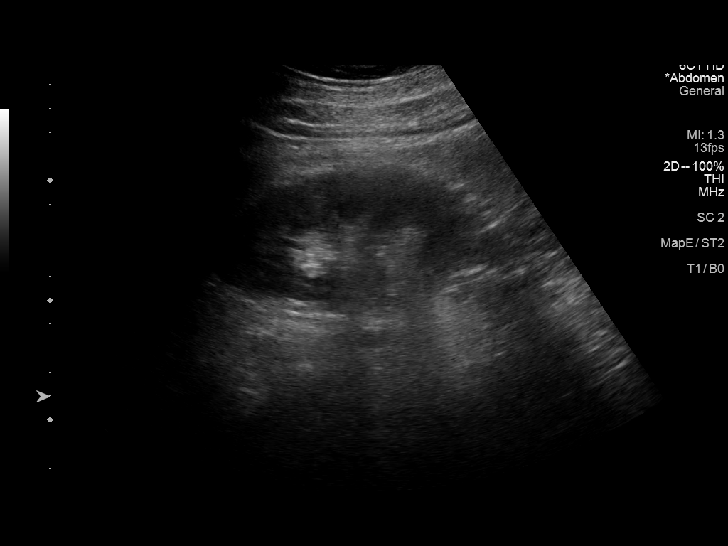
[im 72/123]
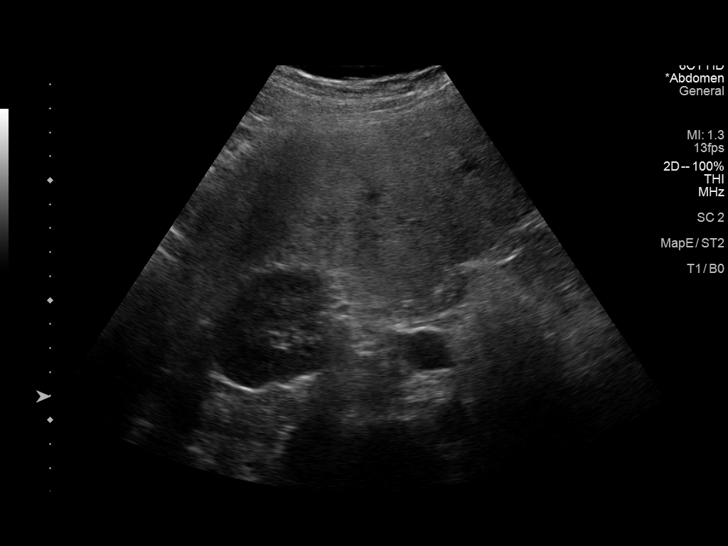
[im 82/123]
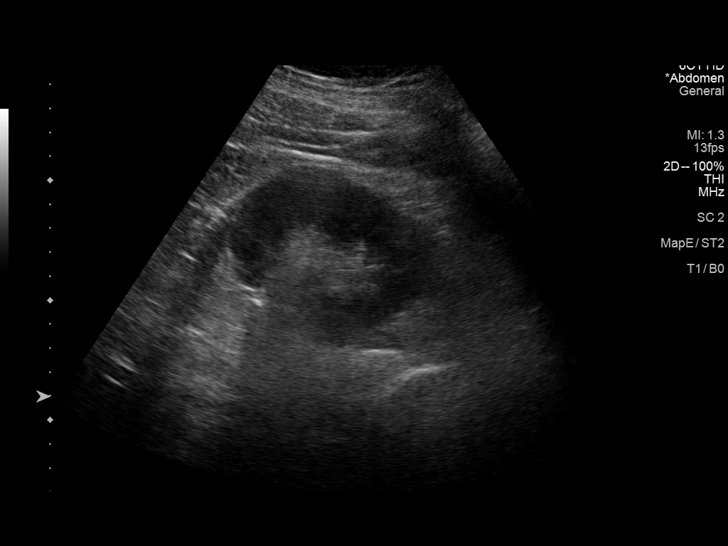
[im 92/123]
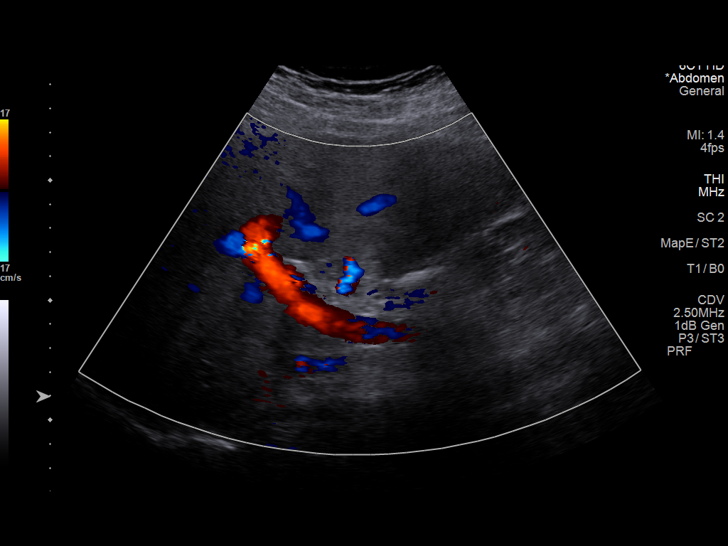
[im 102/123]
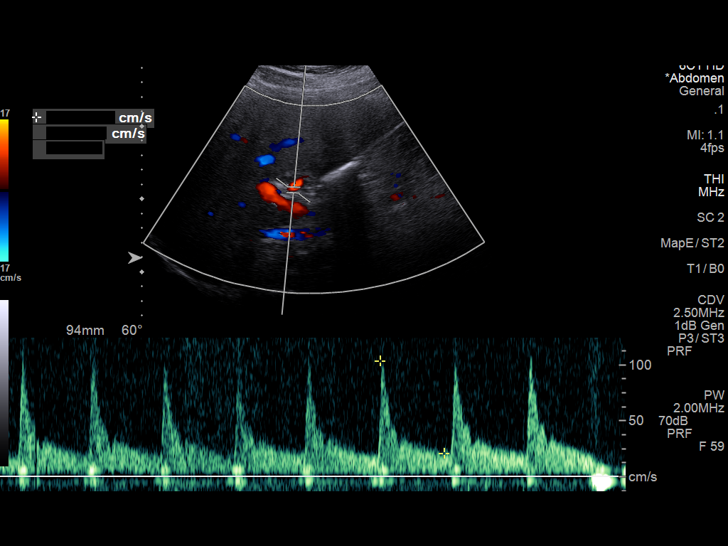
[im 112/123]
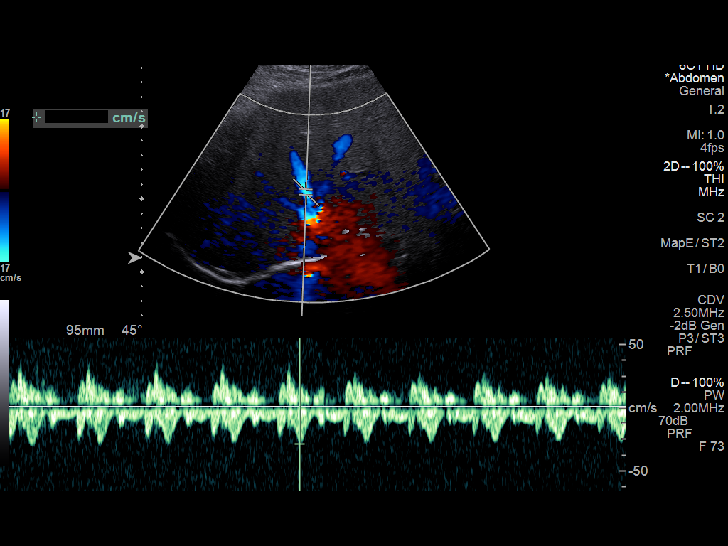
[im 123/123]
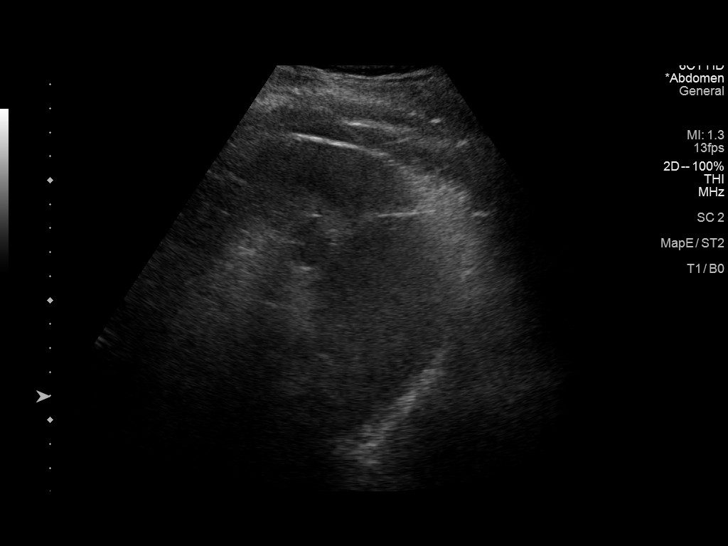

[13 of 25 positions shown; findings below may reference images not displayed]

FINDINGS: Liver: Diffusely heterogeneous and echogenic hepatic parenchyma. No
discrete nodule is identified. Subtle nodularity to the hepatic
contour. No focal lesion, mass or intrahepatic biliary ductal
dilatation.

Main Portal Vein size: 0.9 cm

Portal Vein Velocities

Main Prox:  29 cm/sec

Main Mid: 26 cm/sec

Main Dist:  37 cm/sec
Right: 20 cm/sec
Left: 17 cm/sec

Hepatic Vein Velocities

Right:  29 cm/sec

Middle:  29 cm/sec

Left:  21 cm/sec

IVC: Present and patent with normal respiratory phasicity.

Hepatic Artery Velocity:  104 cm/sec

Splenic Vein Velocity:  41 cm/sec

Spleen: 9.2 cm x 7 cm x 5.9 cm with a total volume of 196 cm^3
(411 cm^3 is upper limit normal)

Portal Vein Occlusion/Thrombus: No

Splenic Vein Occlusion/Thrombus: No

Ascites: None

Varices: None
IMPRESSION: 1. Patent portal veins with normal directional flow. No evidence of
portal hypertension.
2. The hepatic veins and artery are also patent and unremarkable.
3. Advanced hepatic steatosis with possible early morphologic
changes of hepatic cirrhosis.

## 2019-08-31 NOTE — Telephone Encounter (Signed)
Patient last seen 08/13/19 and was advised to follow up for CPE 3-4 months.   Patient has hx of hypertension and alcohol abuse. Please approve if you deem refill appropriate. AS, CMA

## 2019-09-01 DIAGNOSIS — H02834 Dermatochalasis of left upper eyelid: Secondary | ICD-10-CM | POA: Diagnosis not present

## 2019-09-01 DIAGNOSIS — L821 Other seborrheic keratosis: Secondary | ICD-10-CM | POA: Diagnosis not present

## 2019-09-01 DIAGNOSIS — D485 Neoplasm of uncertain behavior of skin: Secondary | ICD-10-CM | POA: Diagnosis not present

## 2019-09-01 DIAGNOSIS — H02831 Dermatochalasis of right upper eyelid: Secondary | ICD-10-CM | POA: Diagnosis not present

## 2019-09-10 ENCOUNTER — Telehealth: Payer: Self-pay | Admitting: Physician Assistant

## 2019-09-10 NOTE — Telephone Encounter (Signed)
Patient called seeking acute care appt w/ provider due to Rt foot issue (says its painful, swelling & turning purple) ---Advised pt unfortunately there are no available appts today nor Tomorrow 5/14 unless we get some cancellations--- Directed him to the [facility] or Emergency Room @ [facility] for immediate medical attention.  ---Remus Loffler to med asst.  glh

## 2019-09-11 NOTE — Telephone Encounter (Signed)
Called patient who states he did not go to UC yesterday for foot swelling, discoloration and pain. I advised patient he needed to seek care at ED and have foot evaluated for blood flow/blood clot. Patient verbalized understanding.

## 2019-11-10 DIAGNOSIS — D23112 Other benign neoplasm of skin of right lower eyelid, including canthus: Secondary | ICD-10-CM | POA: Diagnosis not present

## 2019-11-11 ENCOUNTER — Telehealth: Payer: Self-pay

## 2019-11-11 NOTE — Telephone Encounter (Signed)
Fax received from [facility] stating that they have spoke with the patient and have agreed to delivery of the patient's Skyrizi for 11/11/2019.

## 2019-12-03 ENCOUNTER — Ambulatory Visit (INDEPENDENT_AMBULATORY_CARE_PROVIDER_SITE_OTHER): Payer: Medicare Other | Admitting: Physician Assistant

## 2019-12-03 ENCOUNTER — Other Ambulatory Visit: Payer: Self-pay

## 2019-12-03 DIAGNOSIS — L409 Psoriasis, unspecified: Secondary | ICD-10-CM

## 2019-12-03 MED ORDER — TRIAMCINOLONE ACETONIDE 10 MG/ML IJ SUSP
10.0000 mg | Freq: Once | INTRAMUSCULAR | Status: AC
  Administered 2019-12-03: 10 mg

## 2019-12-03 NOTE — Progress Notes (Signed)
   Follow up Visit  Subjective  Louis Carney is a 39 y.o. male who presents for the following: Psoriasis (follow up on skyrizi patient is doing well. ). Patient is pleased with Skyrizi. Clearest he has been in years. Only active patch with on lower back. He feels well no extra illness.   Objective  Well appearing patient in no apparent distress; mood and affect are within normal limits.  A focused examination was performed including face, trunk, arms, legs. Relevant physical exam findings are noted in the Assessment and Plan. No suspicious moles noted on back.   Objective  Mid Back: Well-marginated erythematous papules/plaques with silvery scale.   Assessment & Plan  Psoriasis Mid Back  Continue Stelara. Ordered CMET, CBC and Lipid. His TB test has been positive in the past and he has been treated by infectious disease.  Intralesional injection - Mid Back Location: mid lower back  Informed Consent: Discussed risks (infection, pain, bleeding, bruising, thinning of the skin, loss of skin pigment,  Indentation, lack of resolution, and recurrence of lesion) and benefits of the procedure, as well as the alternatives. Informed consent was obtained. Preparation: The area was prepared in a standard fashion.   Procedure Details: An intralesional injection was performed with Kenalog 10 mg/cc. 1 cc in total were injected.  Total number of injections: >7  Plan: The patient was instructed on post-op care. Recommend OTC analgesia as needed for pain.

## 2019-12-03 NOTE — Progress Notes (Signed)
Patient is going to PCP to get his labs done CBC, CMET, LIPID.

## 2019-12-04 ENCOUNTER — Other Ambulatory Visit: Payer: Self-pay | Admitting: Physician Assistant

## 2019-12-04 ENCOUNTER — Other Ambulatory Visit: Payer: Self-pay | Admitting: Internal Medicine

## 2019-12-05 ENCOUNTER — Encounter: Payer: Self-pay | Admitting: Physician Assistant

## 2019-12-07 ENCOUNTER — Telehealth: Payer: Self-pay

## 2019-12-07 NOTE — Telephone Encounter (Signed)
Please call pt to schedule appt.  No further refills until pt is seen.  T. Horst Ostermiller, CMA  

## 2019-12-16 ENCOUNTER — Encounter: Payer: Self-pay | Admitting: Emergency Medicine

## 2019-12-16 ENCOUNTER — Other Ambulatory Visit: Payer: Self-pay

## 2019-12-16 ENCOUNTER — Ambulatory Visit
Admission: EM | Admit: 2019-12-16 | Discharge: 2019-12-16 | Disposition: A | Discharge status: Home / Self Care | Payer: Medicare Other | Attending: Emergency Medicine | Admitting: Emergency Medicine

## 2019-12-16 DIAGNOSIS — M25472 Effusion, left ankle: Secondary | ICD-10-CM | POA: Diagnosis not present

## 2019-12-16 DIAGNOSIS — M25572 Pain in left ankle and joints of left foot: Secondary | ICD-10-CM

## 2019-12-16 MED ORDER — METHYLPREDNISOLONE SODIUM SUCC 125 MG IJ SOLR
125.0000 mg | Freq: Once | INTRAMUSCULAR | Status: AC
  Administered 2019-12-16: 125 mg via INTRAMUSCULAR

## 2019-12-16 NOTE — ED Triage Notes (Signed)
Pt here for left ankle pain with swelling x 1 week with hx of same where he would take his wifes gout meds with relief; pt noted to be hypertensive and sts took 2 5 hours energy today and has only been intermittently taking htn meds

## 2019-12-16 NOTE — Discharge Instructions (Addendum)
You are given steroid shot today which will help with pain, swelling. Use Ace wrap, elevate foot as well. Please take time to review low purine eating plan as it can help prevent gout flares. Very important to follow-up with primary care for further evaluation/management.

## 2019-12-16 NOTE — ED Provider Notes (Signed)
[name]    CSN: [id number] Arrival date & time: 12/16/19  1759      History   Chief Complaint Chief Complaint  Patient presents with  . Ankle Pain    HPI Louis Carney is a 39 y.o. male with history of psoriasis, hypertension, migraines, GERD, diabetes presenting for weeklong course of left ankle pain and swelling.  Denies injury or inciting event.  Does consume a lot of alcohol nightly and has been told by family friends that he likely has gout.  Has used his wife gout medication (cannot remember name) relief in the past.  States his wife is out of these medications currently.  Denies recent change in diet, lifestyle, medications.  No numbness, deformity, history of blood clot, claudication.    Past Medical History:  Diagnosis Date  . Arthritis   . Diabetes ([place])    type 1  . GERD (gastroesophageal reflux disease)   . Headache    migraines  . Hypertension   . Psoriasis    since age 45 years  . Tuberculosis    latent TB infection on treatment    Patient Active Problem List   Diagnosis Date Noted  . Diabetes mellitus ([place]) 05/28/2019  . Morbid obesity ([place]) 05/28/2019  . Alcoholic hepatitis without ascites 05/28/2019  . Sprain of calcaneofibular ligament of ankle, initial encounter- L-  pt refused xrays of ankle 05/28/2019  . Alcohol abuse with alcohol-induced disorder ([place]) 02/07/2019  . Hepatitis 02/05/2019  . Adjustment disorder with mixed anxiety and depressed mood 02/05/2019  . Hepatocellular damage 11/07/2018  . Elevated liver function tests 11/07/2018  . Nausea 11/07/2018  . Alcohol abuse 09/09/2018  . HNP (herniated nucleus pulposus), lumbar 03/07/2018  . Transaminitis 01/29/2018  . Medication monitoring encounter 11/29/2017  . TB lung, latent 11/06/2017  . Family history of diabetes mellitus in father 09/12/2017  . High risk medication use 09/12/2017  . Vitamin D deficiency 09/12/2017  . Hyperlipidemia associated with type 2 diabetes  mellitus ([place]) 09/12/2017  . Low level of high density lipoprotein (HDL) 09/12/2017  . Hypertriglyceridemia 09/12/2017  . Psoriasis-onset age 65 has needed Cosentyx in past has failed Enbrel and Humira 08/26/2017  . Chronic radicular low back pain- R sided 08/26/2017  . History of excision of lamina of cervical vertebra for decompression of sp- C4-6inal cord 08/26/2017  . 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 RSD (reflex sympathetic dystrophy)- right entire leg painful to non-noxious stimuli 08/26/2017  . Cigarette smoker- 3 ppd for 6 yrs, then 1ppd for 9 yrs- 27 pk yr hx 08/26/2017  . Drinks beer- 6-8 beers/d on ave ( 10 Yrs or so) 08/26/2017  . Hypertension associated with diabetes ([place]) 08/26/2017  . NSAID long-term use-4 ibuprofen 3 times daily times at least 5 years 08/26/2017  . 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 Chronic fatigue 08/26/2017  . Mood disorder ([place])- secondary to chronic pain syndrome 08/26/2017  . Insomnia 08/26/2017  . Environmental and seasonal allergies 08/26/2017  . ED (erectile dysfunction) 08/26/2017  . Allergic conjunctivitis of both eyes and rhinitis 08/26/2017  . Class 1 obesity 08/26/2017  . Plaque psoriasis 08/26/2017    Past Surgical History:  Procedure Laterality Date  . CARPAL TUNNEL RELEASE Right 01/15/2018   Procedure: Right Carpal tunnel release;  Surgeon: Ashok Pall, MD;  Location: [facility];  Service: Neurosurgery;  Laterality: Right;  Right Carpal tunnel release  . CERVICAL SPINE SURGERY    .  LUMBAR LAMINECTOMY/DECOMPRESSION MICRODISCECTOMY Right 03/07/2018   Procedure: Right Lumbar 5 Sacral 1 Microdiscectomy;  Surgeon: Ashok Pall, MD;  Location: [facility];  Service: Neurosurgery;  Laterality: Right;  Right Lumbar 5 Sacral 1 Microdiscectomy  . NECK SURGERY         Home Medications    Prior to Admission medications   Medication Sig Start  Date End Date Taking? Authorizing Provider  blood glucose meter kit and supplies Dispense based on patient and insurance preference. Use to check glucose level three times daily before meals and 3 times daily 2 hours after meals (6 times daily total). (FOR ICD-10 E10.9, E11.9). 10/27/18   Opalski, Neoma Laming, DO  esomeprazole (NEXIUM) 20 MG capsule Take 20 mg by mouth daily at 12 noon.    [provider]  gabapentin (NEURONTIN) 300 MG capsule Take 2 capsules (600 mg total) by mouth 2 (two) times daily. 08/13/19   Opalski, Neoma Laming, DO  ibuprofen (ADVIL) 200 MG tablet Take 800 mg by mouth 2 (two) times daily.    [provider]  insulin aspart (NOVOLOG FLEXPEN) 100 UNIT/ML FlexPen Inject 10-14 Units into the skin 3 (three) times daily with meals. 11/06/18   Philemon Kingdom, MD  insulin glargine (LANTUS SOLOSTAR) 100 UNIT/ML Solostar Pen Inject 8 units into the skin twice daily-12 hours apart. 08/13/19   Mellody Dance, DO  neomycin-polymyxin b-dexamethasone (MAXITROL) 3.5-10000-0.1 OINT SMARTSIG:Sparingly In Eye(s) Twice Daily 11/03/19   [provider]  Olmesartan-amLODIPine-HCTZ 40-10-25 MG TABS Take 1 tablet by mouth daily. 1QD 08/13/19   Opalski, Neoma Laming, DO  OneTouch Delica Lancets 79Y MISC USE TO TEST BLOOD SUGARS 6 TIMES DAILY (BEFORE MEALS AND 2 HOURS AFTER MEALS) 07/28/19   Opalski, Neoma Laming, DO  OneTouch Delica Lancets 80X MISC USE 1  TO CHECK GLUCOSE SIX TIMES DAILY (BEFORE  MEALS  AND  TWO  HOURS  AFTER  MEALS) 07/28/19   Opalski, Neoma Laming, DO  ONETOUCH VERIO test strip USE TO TEST 6 TIMES DAILY (BEFORE MEALS AND 2 HOURS AFTER MEALS) 07/28/19   Opalski, Deborah, DO  OZEMPIC, 0.25 OR 0.5 MG/DOSE, 2 MG/1.5ML SOPN INJECT 0.5MG INTO THE SKIN ONCE WEEKLY 12/04/19   Philemon Kingdom, MD  Risankizumab-rzaa,150 MG Dose, (SKYRIZI, 150 MG DOSE,) 75 MG/0.83ML PSKT Inject 300 mg into the skin every 3 (three) months. 150 mg (1 injection) in each leg every 3 months    [provider]    sildenafil (VIAGRA) 100 MG tablet TAKE 1/2 TO 1 TABLET BY MOUTH 30 MINUTES PRIOR TO INTERCOURSE AS NEEDED. 12/07/19   Lorrene Reid, PA-C    Family History Family History  Problem Relation Age of Onset  . Alcohol abuse Mother   . Hypertension Mother   . Lung cancer Mother   . Alcohol abuse Father   . Hypertension Father   . Colon cancer Father        dx in his late 32's  . Esophageal cancer Neg Hx   . Rectal cancer Neg Hx     Social History Social History   Tobacco Use  . Smoking status: Current Every Day Smoker    Packs/day: 1.00    Years: 15.00    Pack years: 15.00    Types: Cigarettes  . Smokeless tobacco: Never Used  Vaping Use  . Vaping Use: Never used  Substance Use Topics  . Alcohol use: Yes    Alcohol/week: 24.0 standard drinks    Types: 24 Standard drinks or equivalent per week  . Drug use: Never  Allergies   Patient has no known allergies.   Review of Systems As per HPI   Physical Exam Triage Vital Signs ED Triage Vitals [12/16/19 1815]  Enc Vitals Group     BP (!) 203/130     Pulse Rate 100     Resp 18     Temp 99 F (37.2 C)     Temp Source Oral     SpO2 96 %     Weight      Height      Head Circumference      Peak Flow      Pain Score 8     Pain Loc      Pain Edu?      Excl. in [place]?    No data found.  Updated Vital Signs BP (!) 203/130 (BP Location: Left Arm)   Pulse 100   Temp 99 F (37.2 C) (Oral)   Resp 18   SpO2 96%   Visual Acuity Right Eye Distance:   Left Eye Distance:   Bilateral Distance:    Right Eye Near:   Left Eye Near:    Bilateral Near:     Physical Exam Constitutional:      General: He is not in acute distress. HENT:     Head: Normocephalic and atraumatic.  Eyes:     General: No scleral icterus.    Pupils: Pupils are equal, round, and reactive to light.  Cardiovascular:     Rate and Rhythm: Normal rate.  Pulmonary:     Effort: Pulmonary effort is normal. No respiratory distress.     Breath  sounds: No wheezing.  Musculoskeletal:        General: Swelling and tenderness present. Normal range of motion.     Comments: Diffuse left ankle pain, swelling without bony tenderness or deformity.  Skin:    Capillary Refill: Capillary refill takes less than 2 seconds.     Coloration: Skin is not jaundiced or pale.     Findings: Erythema present. No bruising.  Neurological:     General: No focal deficit present.     Mental Status: He is alert and oriented to person, place, and time.      UC Treatments / Results  Labs (all labs ordered are listed, but only abnormal results are displayed) Labs Reviewed - No data to display  EKG   Radiology No results found.  Procedures Procedures (including critical care time)  Medications Ordered in UC Medications  methylPREDNISolone sodium succinate (SOLU-MEDROL) 125 mg/2 mL injection 125 mg (125 mg Intramuscular Given 12/16/19 1845)    Initial Impression / Assessment and Plan / UC Course  I have reviewed the triage vital signs and the nursing notes.  Pertinent labs & imaging results that were available during my care of the patient were reviewed by me and considered in my medical decision making (see chart for details).     Patient febrile, nontoxic, without integument/injury: Radiography deferred.  Given Solu-Medrol in office for likely gout flare given H&P previous episodes, high alcohol consumption, and resolution with wife's Medication.  Return precautions discussed, pt verbalized understanding and is agreeable to plan. Final Clinical Impressions(s) / UC Diagnoses   Final diagnoses:  Pain and swelling of left ankle     Discharge Instructions     You are given steroid shot today which will help with pain, swelling. Use Ace wrap, elevate foot as well. Please take time to review low purine eating plan as it can help  prevent gout flares. Very important to follow-up with primary care for further evaluation/management.    ED  Prescriptions    None     PDMP not reviewed this encounter.   Neldon Mc [place], [place] 12/16/19 1902

## 2019-12-17 ENCOUNTER — Telehealth: Payer: Self-pay | Admitting: Physician Assistant

## 2019-12-17 NOTE — Telephone Encounter (Signed)
Patient calling about labs that need to be done for skyrizi. He was here a couple weeks ago and he said he would have PCP (Maritza Abonza, PA-C) do the blood work. He spoke with PCP and she needs order through [facility].

## 2019-12-18 NOTE — Telephone Encounter (Signed)
Left message for patient to call us back.  

## 2019-12-24 ENCOUNTER — Encounter: Payer: Self-pay | Admitting: Internal Medicine

## 2019-12-24 ENCOUNTER — Telehealth (INDEPENDENT_AMBULATORY_CARE_PROVIDER_SITE_OTHER): Payer: Medicare Other | Admitting: Internal Medicine

## 2019-12-24 ENCOUNTER — Ambulatory Visit
Admission: EM | Admit: 2019-12-24 | Discharge: 2019-12-24 | Disposition: A | Discharge status: Home / Self Care | Payer: Medicare Other | Attending: Family Medicine | Admitting: Family Medicine

## 2019-12-24 ENCOUNTER — Ambulatory Visit (INDEPENDENT_AMBULATORY_CARE_PROVIDER_SITE_OTHER): Payer: Medicare Other

## 2019-12-24 ENCOUNTER — Other Ambulatory Visit: Payer: Self-pay

## 2019-12-24 DIAGNOSIS — M7989 Other specified soft tissue disorders: Secondary | ICD-10-CM

## 2019-12-24 DIAGNOSIS — M25572 Pain in left ankle and joints of left foot: Secondary | ICD-10-CM | POA: Diagnosis not present

## 2019-12-24 DIAGNOSIS — Z91199 Patient's noncompliance with other medical treatment and regimen due to unspecified reason: Secondary | ICD-10-CM

## 2019-12-24 DIAGNOSIS — M25472 Effusion, left ankle: Secondary | ICD-10-CM | POA: Diagnosis not present

## 2019-12-24 DIAGNOSIS — Z5329 Procedure and treatment not carried out because of patient's decision for other reasons: Secondary | ICD-10-CM

## 2019-12-24 DIAGNOSIS — M79662 Pain in left lower leg: Secondary | ICD-10-CM | POA: Diagnosis not present

## 2019-12-24 NOTE — ED Triage Notes (Signed)
Pt presents with complaints of pain and swelling in his left ankle. Denies any known injury. Reports the pain is constant. Reports swelling is not present in the morning but gets worse over the day. Patient was seen a week ago and given a shot and told to come back if it was not better. Pt is requesting x ray.

## 2019-12-24 NOTE — ED Provider Notes (Signed)
[facility]   [id number] 12/24/19 Arrival Time: 1559  DU:KGURK PAIN  SUBJECTIVE: History from: patient. Louis Carney is a 39 y.o. male complains of left ankle pain and swelling that began about a week ago. Denies a precipitating event or specific injury. Localizes the pain to the lateral aspect of the L ankle. Describes the pain as constant and achy in character. Reports that his leg is not swollen when he wakes up in the morning, and gets progressively worse throughout the day. Symptoms are made worse with activity. Was seen in this office and was treated with a steroid injection with little relief. Denies fever, chills, erythema, ecchymosis, weakness, numbness and tingling, saddle paresthesias, loss of bowel or bladder function.      ROS: As per HPI.  All other pertinent ROS negative.     Past Medical History:  Diagnosis Date  . Arthritis   . Diabetes ([place])    type 1  . GERD (gastroesophageal reflux disease)   . Headache    migraines  . Hypertension   . Psoriasis    since age 46 years  . Tuberculosis    latent TB infection on treatment   Past Surgical History:  Procedure Laterality Date  . CARPAL TUNNEL RELEASE Right 01/15/2018   Procedure: Right Carpal tunnel release;  Surgeon: Ashok Pall, MD;  Location: [facility];  Service: Neurosurgery;  Laterality: Right;  Right Carpal tunnel release  . CERVICAL SPINE SURGERY    . LUMBAR LAMINECTOMY/DECOMPRESSION MICRODISCECTOMY Right 03/07/2018   Procedure: Right Lumbar 5 Sacral 1 Microdiscectomy;  Surgeon: Ashok Pall, MD;  Location: [facility];  Service: Neurosurgery;  Laterality: Right;  Right Lumbar 5 Sacral 1 Microdiscectomy  . NECK SURGERY     No Known Allergies No current facility-administered medications on file prior to encounter.   Current Outpatient Medications on File Prior to Encounter  Medication Sig Dispense Refill  . blood glucose meter kit and supplies Dispense based on patient and insurance preference. Use to  check glucose level three times daily before meals and 3 times daily 2 hours after meals (6 times daily total). (FOR ICD-10 E10.9, E11.9). 1 each 0  . esomeprazole (NEXIUM) 20 MG capsule Take 20 mg by mouth daily at 12 noon.    . gabapentin (NEURONTIN) 300 MG capsule Take 2 capsules (600 mg total) by mouth 2 (two) times daily. 360 capsule 0  . ibuprofen (ADVIL) 200 MG tablet Take 800 mg by mouth 2 (two) times daily.    . insulin aspart (NOVOLOG FLEXPEN) 100 UNIT/ML FlexPen Inject 10-14 Units into the skin 3 (three) times daily with meals. 15 mL 11  . insulin glargine (LANTUS SOLOSTAR) 100 UNIT/ML Solostar Pen Inject 8 units into the skin twice daily-12 hours apart. 5 pen PRN  . neomycin-polymyxin b-dexamethasone (MAXITROL) 3.5-10000-0.1 OINT SMARTSIG:Sparingly In Eye(s) Twice Daily    . Olmesartan-amLODIPine-HCTZ 40-10-25 MG TABS Take 1 tablet by mouth daily. 1QD 90 tablet 0  . OneTouch Delica Lancets 27C MISC USE TO TEST BLOOD SUGARS 6 TIMES DAILY (BEFORE MEALS AND 2 HOURS AFTER MEALS) 200 each 0  . OneTouch Delica Lancets 62B MISC USE 1  TO CHECK GLUCOSE SIX TIMES DAILY (BEFORE  MEALS  AND  TWO  HOURS  AFTER  MEALS) 200 each 0  . ONETOUCH VERIO test strip USE TO TEST 6 TIMES DAILY (BEFORE MEALS AND 2 HOURS AFTER MEALS) 200 each 0  . OZEMPIC, 0.25 OR 0.5 MG/DOSE, 2 MG/1.5ML SOPN INJECT 0.5MG INTO THE SKIN ONCE WEEKLY  2 mL 0  . Risankizumab-rzaa,150 MG Dose, (SKYRIZI, 150 MG DOSE,) 75 MG/0.83ML PSKT Inject 300 mg into the skin every 3 (three) months. 150 mg (1 injection) in each leg every 3 months    . sildenafil (VIAGRA) 100 MG tablet TAKE 1/2 TO 1 TABLET BY MOUTH 30 MINUTES PRIOR TO INTERCOURSE AS NEEDED. 30 tablet 0   Social History   Socioeconomic History  . Marital status: Married    Spouse name: Not on file  . Number of children: 5  . Years of education: Not on file  . Highest education level: Not on file  Occupational History  . Occupation: disabled  Tobacco Use  . Smoking status:  Current Every Day Smoker    Packs/day: 1.00    Years: 15.00    Pack years: 15.00    Types: Cigarettes  . Smokeless tobacco: Never Used  Vaping Use  . Vaping Use: Never used  Substance and Sexual Activity  . Alcohol use: Yes    Alcohol/week: 24.0 standard drinks    Types: 24 Standard drinks or equivalent per week  . Drug use: Never  . Sexual activity: Yes    Partners: Female    Birth control/protection: None  Other Topics Concern  . 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 Difficulty of Paying Living Expenses: Not on file  Food Insecurity:   . Worried About Charity fundraiser in the Last Year: Not on file  . Ran Out of Food in the Last Year: Not on file  Transportation Needs:   . Lack of Transportation (Medical): Not on file  . Lack of Transportation (Non-Medical): Not on file  Physical Activity:   . Days of Exercise per Week: Not on file  . Minutes of Exercise per Session: Not on file  Stress:   . Feeling of Stress : Not on file  Social Connections:   . Frequency of Communication with Friends and Family: Not on file  . Frequency of Social Gatherings with Friends and Family: Not on file  . Attends Religious Services: Not on file  . Active Member of Clubs or Organizations: Not on file  . Attends Archivist Meetings: Not on file  . Marital Status: Not on file  Intimate Partner Violence:   . Fear of Current or Ex-Partner: Not on file  . Emotionally Abused: Not on file  . Physically Abused: Not on file  . Sexually Abused: Not on file   Family History  Problem Relation Age of Onset  . Alcohol abuse Mother   . Hypertension Mother   . Lung cancer Mother   . Alcohol abuse Father   .  Hypertension Father   . Colon cancer Father        dx in his late 29's  . Esophageal cancer Neg Hx   . Rectal cancer Neg Hx     OBJECTIVE:  Vitals:   12/24/19 1652  BP: (!) 164/100  Pulse: 90  Resp: 18  Temp: 98.6 F (37 C)  SpO2: 98%    General appearance: ALERT; in no acute distress.  Head: NCAT Lungs: Normal respiratory effort CV: pulses 2+ bilaterally. Cap refill <  2 seconds Musculoskeletal:  Inspection: Skin warm, dry, clear and intact without obvious erythema, effusion, or ecchymosis.  Palpation: tender to palpation to left ankle ROM: limited ROM active and passive to left ankle Skin: warm and dry Neurologic: Ambulates without difficulty; Sensation intact about the upper/ lower extremities Psychological: alert and cooperative; normal mood and affect  DIAGNOSTIC STUDIES:  DG Ankle Complete Left  Result Date: 12/24/2019 CLINICAL DATA:  Ankle pain and swelling EXAM: LEFT ANKLE COMPLETE - 3+ VIEW COMPARISON:  None. FINDINGS: No fracture or malalignment. Ankle mortise is symmetric. Generalized soft tissue swelling. Mild degenerative spurring medially and laterally. IMPRESSION: No acute osseous abnormality. Electronically Signed   By: Donavan Foil M.D.   On: 12/24/2019 17:23   US Venous Img Lower Unilateral Left  Result Date: 12/25/2019 CLINICAL DATA:  Left lower extremity edema, pain, and color changes EXAM: Left LOWER EXTREMITY VENOUS DOPPLER ULTRASOUND TECHNIQUE: Gray-scale sonography with compression, as well as color and duplex ultrasound, were performed to evaluate the deep venous system(s) from the level of the common femoral vein through the popliteal and proximal calf veins. COMPARISON:  None. FINDINGS: VENOUS Normal compressibility of the common femoral, superficial femoral, and popliteal veins, as well as the visualized calf veins. Visualized portions of profunda femoral vein and great saphenous vein unremarkable. No filling defects to suggest DVT on grayscale or  color Doppler imaging. Doppler waveforms show normal direction of venous flow, normal respiratory plasticity and response to augmentation. Limited views of the contralateral common femoral vein are unremarkable. OTHER None. Limitations: none IMPRESSION: 1. No sonographic findings of left lower extremity DVT. Electronically Signed   By: Van Clines M.D.   On: 12/25/2019 11:43     ASSESSMENT & PLAN:  1. Pain and swelling of left ankle   2. Pain and swelling of left lower leg     Xray negative Discussed with patient that I cannot rule out a blood clot in the office Discussed that he would be better served in the ER for further evaluation and treatment Verbalized understanding and agrees with treatment plan Will take private vehicle to ER       Faustino Congress, NP 12/25/19 1448

## 2019-12-24 NOTE — Progress Notes (Signed)
Patient ID: Louis Carney, male   DOB: 1980/07/04, 39 y.o.   MRN: [id number]   NO SHOW  HPI: Louis Carney is a 39 y.o.-year-old male, initially referred by his PCP, Dr. Raliegh Scarlet, returning for follow-up for DM2, dx in 09/2018, insulin-dependent, uncontrolled, without long-term complications.  Last visit 4 months ago.  Reviewed history: He had dizziness, blurry vision >> checked sugar with neighbor's meter >> CBG high (HI, then rechecked: 540).  He saw PCP >> HbA1c was high x2 >> added insulin.  He felt much better after adding insulin.  However, sugars were still extremely high at last visit so we added rapid acting insulin and I made specific suggestions about his diet and alcohol intake.  Reviewed HbA1c levels: Lab Results  Component Value Date   HGBA1C 6.2 (A) 08/21/2019   HGBA1C 6.0 (A) 01/20/2019   HGBA1C 9.9 (A) 10/27/2018   HGBA1C 10.2 (H) 10/27/2018   HGBA1C 5.6 03/06/2018   HGBA1C 5.3 08/26/2017   He is on: - Ozempic 0.5 mg weekly - Lantus 25 units 2x a day - Novolog 8-14 units 15 min before lunch and dinner >> 14 >> 8-10 units before a large meal only  Pt checks his sugars once a day: - am: 202-300 >> 105-132 >> 107-140 >> 97-126 - 2h after b'fast: n/c - before lunch: 300-340 >> <130 >> 140s >> 97-126 - 2h after lunch: n/c - before dinner: 340 >> 128-132 >> n/c - 2h after dinner: n/c - bedtime: 340 >> 130s >> up to 189 >> 106-126 - nighttime: n/c Lowest sugar was 202 >> 105 >> 107 >> 97; it is unclear at which CBG level he has hypoglycemia awareness. Highest sugar was 460 >> 141 >> 189 >> 126.  Glucometer:ReliOn  Pt's meals are: - Breakfast: skips - Lunch: [place] sandwich  - Dinner: lasagna, hamburger, steak + potatoes, pasta - Snacks:cookies, chips. He does not like greens. In the past, he was drinking 4-6 shots of vodka, 6 pack beer EVERY night >> then 5 Bud Light beers a night >> then liquor/vodka over Christmas >> stopped at the beginning of the year  -No  CKD.  Last BUN/creatinine:  Lab Results  Component Value Date   BUN 8 11/06/2018   BUN 25 (H) 10/27/2018   CREATININE 0.79 11/06/2018   CREATININE 1.06 10/27/2018  On olmesartan.  -He had significant hypertriglyceridemia which improved at last check, but also has a very high LDL: Lab Results  Component Value Date   CHOL 267 (H) 01/20/2019   HDL 32.90 (L) 01/20/2019   LDLCALC Comment 10/27/2018   LDLDIRECT 184.0 01/20/2019   TRIG 386.0 (H) 01/20/2019   CHOLHDL 8 01/20/2019    + Transaminitis: Lab Results  Component Value Date   ALT 102 (H) 01/20/2019   AST 119 (H) 01/20/2019   ALKPHOS 50 01/20/2019   BILITOT 0.7 01/20/2019  He sees Dr. Carlean Purl with [facility].    - last eye exam was in 12/2018: No DR  -She does have numbness and tingling in his right leg from back pain- see Dr. Cyndy Freeze.  Pt has FH of DM in father - type 2 DM.  + Significant family history of alcoholism  He is on Risankizumab in 04/2018 for psoriasis >> working. Prev. Embrel, Humira, etc.  No history of pancreatitis or personal or family history of medullary thyroid cancer.  ROS: Constitutional: no weight gain/no weight loss, no fatigue, no subjective hyperthermia, no subjective hypothermia Eyes: no blurry vision, no xerophthalmia ENT: no  sore throat, no nodules palpated in neck, no dysphagia, no odynophagia, no hoarseness Cardiovascular: no CP/no SOB/no palpitations/no leg swelling Respiratory: no cough/no SOB/no wheezing Gastrointestinal: no N/no V/no D/no C/no acid reflux Musculoskeletal: no muscle aches/+ joint aches Skin: + Psoriatic rash on hands, no hair loss Neurological: no tremors/no numbness/no tingling/no dizziness  I reviewed pt's medications, allergies, PMH, social hx, family hx, and changes were documented in the history of present illness. Otherwise, unchanged from my initial visit note.   Past Medical History:  Diagnosis Date  . Arthritis   . Diabetes ([place])    type 1  . GERD  (gastroesophageal reflux disease)   . Headache    migraines  . Hypertension   . Psoriasis    since age 39 years  . Tuberculosis    latent TB infection on treatment   Past Surgical History:  Procedure Laterality Date  . CARPAL TUNNEL RELEASE Right 01/15/2018   Procedure: Right Carpal tunnel release;  Surgeon: Ashok Pall, MD;  Location: [place];  Service: Neurosurgery;  Laterality: Right;  Right Carpal tunnel release  . CERVICAL SPINE SURGERY    . LUMBAR LAMINECTOMY/DECOMPRESSION MICRODISCECTOMY Right 03/07/2018   Procedure: Right Lumbar 5 Sacral 1 Microdiscectomy;  Surgeon: Ashok Pall, MD;  Location: [place];  Service: Neurosurgery;  Laterality: Right;  Right Lumbar 5 Sacral 1 Microdiscectomy  . NECK SURGERY     Social History   Socioeconomic History  . Marital status: Married    Spouse name: Not on file  . Number of children: 5  . Years of education: Not on file  . Highest education level: Not on file  Occupational History  . Occupation: disabled  Tobacco Use  . Smoking status: Current Every Day Smoker    Packs/day: 1.00    Years: 15.00    Pack years: 15.00    Types: Cigarettes  . Smokeless tobacco: Never Used  Vaping Use  . Vaping Use: Never used  Substance and Sexual Activity  . Alcohol use: Yes    Alcohol/week: 24.0 standard drinks    Types: 24 Standard drinks or equivalent per week  . Drug use: Never  . Sexual activity: Yes    Partners: Female    Birth control/protection: None  Other Topics Concern  . 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 Difficulty of Paying Living Expenses: Not on file  Food Insecurity:   . Worried About Charity fundraiser in the Last Year: Not on file  . Ran  Out of Food in the Last Year: Not on file  Transportation Needs:   . Lack of Transportation (Medical): Not on file  . Lack of Transportation (Non-Medical): Not on file  Physical Activity:   . Days of Exercise per Week: Not on file  . Minutes of Exercise per Session: Not on file  Stress:   . Feeling of Stress : Not on file  Social Connections:   . Frequency of Communication with Friends and Family: Not on file  . Frequency of Social Gatherings with Friends and Family: Not on file  . Attends Religious Services: Not on file  . Active Member of Clubs or Organizations: Not on  file  . Attends Archivist Meetings: Not on file  . Marital Status: Not on file  Intimate Partner Violence:   . Fear of Current or Ex-Partner: Not on file  . Emotionally Abused: Not on file  . Physically Abused: Not on file  . Sexually Abused: Not on file   Current Outpatient Medications on File Prior to Visit  Medication Sig Dispense Refill  . blood glucose meter kit and supplies Dispense based on patient and insurance preference. Use to check glucose level three times daily before meals and 3 times daily 2 hours after meals (6 times daily total). (FOR ICD-10 E10.9, E11.9). 1 each 0  . esomeprazole (NEXIUM) 20 MG capsule Take 20 mg by mouth daily at 12 noon.    . gabapentin (NEURONTIN) 300 MG capsule Take 2 capsules (600 mg total) by mouth 2 (two) times daily. 360 capsule 0  . ibuprofen (ADVIL) 200 MG tablet Take 800 mg by mouth 2 (two) times daily.    . insulin aspart (NOVOLOG FLEXPEN) 100 UNIT/ML FlexPen Inject 10-14 Units into the skin 3 (three) times daily with meals. 15 mL 11  . insulin glargine (LANTUS SOLOSTAR) 100 UNIT/ML Solostar Pen Inject 8 units into the skin twice daily-12 hours apart. 5 pen PRN  . neomycin-polymyxin b-dexamethasone (MAXITROL) 3.5-10000-0.1 OINT SMARTSIG:Sparingly In Eye(s) Twice Daily    . Olmesartan-amLODIPine-HCTZ 40-10-25 MG TABS Take 1 tablet by mouth daily. 1QD 90 tablet 0   . OneTouch Delica Lancets 66A MISC USE TO TEST BLOOD SUGARS 6 TIMES DAILY (BEFORE MEALS AND 2 HOURS AFTER MEALS) 200 each 0  . OneTouch Delica Lancets 63K MISC USE 1  TO CHECK GLUCOSE SIX TIMES DAILY (BEFORE  MEALS  AND  TWO  HOURS  AFTER  MEALS) 200 each 0  . ONETOUCH VERIO test strip USE TO TEST 6 TIMES DAILY (BEFORE MEALS AND 2 HOURS AFTER MEALS) 200 each 0  . OZEMPIC, 0.25 OR 0.5 MG/DOSE, 2 MG/1.5ML SOPN INJECT 0.5MG INTO THE SKIN ONCE WEEKLY 2 mL 0  . Risankizumab-rzaa,150 MG Dose, (SKYRIZI, 150 MG DOSE,) 75 MG/0.83ML PSKT Inject 300 mg into the skin every 3 (three) months. 150 mg (1 injection) in each leg every 3 months    . sildenafil (VIAGRA) 100 MG tablet TAKE 1/2 TO 1 TABLET BY MOUTH 30 MINUTES PRIOR TO INTERCOURSE AS NEEDED. 30 tablet 0   No current facility-administered medications on file prior to visit.   No Known Allergies Family History  Problem Relation Age of Onset  . Alcohol abuse Mother   . Hypertension Mother   . Lung cancer Mother   . Alcohol abuse Father   . Hypertension Father   . Colon cancer Father        dx in his late 43's  . Esophageal cancer Neg Hx   . Rectal cancer Neg Hx     PE:  There were no vitals taken for this visit. Wt Readings from Last 3 Encounters:  08/21/19 232 lb (105.2 kg)  08/13/19 225 lb (102.1 kg)  05/28/19 232 lb 1.3 oz (105.3 kg)   Constitutional:  in NAD  The physical exam was not performed (virtual visit).  ASSESSMENT: 1. DM, insulin-dependent, without long-term complications -Of note, we ruled out type 1 diabetes in the past. -He could not afford a CGM.  2. HTG  3.  Obesity class I  PLAN:  1. Patient with history of insulin-dependent diabetes, which improved significantly after he improved his diet.  He started to cut  out fast foods, starches, snacks.  However, he then restarted to drink alcohol and relax his diet so sugars started to increase.  We started Ozempic and I strongly advised him to stop alcohol.  He did so  after last winter holidays. -At last visit, sugars were excellent throughout the day but he was not taking his NovoLog consistently, only approximately once a day when he ate a larger meal.  We decreased his dose to 8-10 units and I advised him to continue to use it only before a large meal.  We did not decrease the Lantus dose at that time but discussed that we may be able to do so if his sugars remain well controlled.  HbA1c was 6.2% at that time.  - I suggested to:  Patient Instructions  Please continue: - Ozempic 0.5 mg weekly - Lantus 25 units 2x a day - Novolog 8-10 units 15 min before a large meal  Please return in 3 months with your sugar log.   - advised to check sugars at different times of the day - 3x a day, rotating check times - advised for yearly eye exams >> he is UTD - return to clinic in 3 months  2. HTG -Reviewed latest lipid panel from 12/2018: LDL very high, triglycerides also very high, HDL low Lab Results  Component Value Date   CHOL 267 (H) 01/20/2019   HDL 32.90 (L) 01/20/2019   LDLCALC Comment 10/27/2018   LDLDIRECT 184.0 01/20/2019   TRIG 386.0 (H) 01/20/2019   CHOLHDL 8 01/20/2019  -Not on a statin >> at last visit we discussed about starting a statin  3.  Obesity class I -He relaxed his diet over last winter holidays and restarted to drink liquor.  However, he did stop drinking afterwards. -At last visit he was snacking constantly between meals (crackers, cheese, etc.).  I advised him to stick with his meals and not snack at all between meals but he had to do self to only snack on fruit -We will continue the GLP-1 receptor agonist, which should also help with weight loss -He gained 7 pounds before last visit  Philemon Kingdom, MD PhD [facility]

## 2019-12-24 NOTE — Discharge Instructions (Addendum)
You xray was negative for any fractures or bony abnormalities  I would like you to go to the ER for further evaluation and treatment  We cannot rule out a blood clot in the office

## 2019-12-25 ENCOUNTER — Other Ambulatory Visit: Payer: Self-pay

## 2019-12-25 ENCOUNTER — Emergency Department
Admission: EM | Admit: 2019-12-25 | Discharge: 2019-12-25 | Disposition: A | Discharge status: Home / Self Care | Payer: Medicare Other | Attending: Student in an Organized Health Care Education/Training Program | Admitting: Student in an Organized Health Care Education/Training Program

## 2019-12-25 ENCOUNTER — Encounter: Payer: Self-pay | Admitting: Emergency Medicine

## 2019-12-25 ENCOUNTER — Emergency Department: Payer: Medicare Other

## 2019-12-25 DIAGNOSIS — R600 Localized edema: Secondary | ICD-10-CM | POA: Diagnosis not present

## 2019-12-25 DIAGNOSIS — R2242 Localized swelling, mass and lump, left lower limb: Secondary | ICD-10-CM | POA: Diagnosis not present

## 2019-12-25 DIAGNOSIS — M79605 Pain in left leg: Secondary | ICD-10-CM | POA: Diagnosis present

## 2019-12-25 DIAGNOSIS — Z5321 Procedure and treatment not carried out due to patient leaving prior to being seen by health care provider: Secondary | ICD-10-CM | POA: Insufficient documentation

## 2019-12-25 DIAGNOSIS — M10072 Idiopathic gout, left ankle and foot: Secondary | ICD-10-CM

## 2019-12-25 LAB — CBC WITH DIFFERENTIAL/PLATELET
Abs Immature Granulocytes: 0.1 K/uL — ABNORMAL HIGH (ref 0.00–0.07)
Basophils Absolute: 0.1 K/uL (ref 0.0–0.1)
Basophils Relative: 1 %
Eosinophils Absolute: 0.3 K/uL (ref 0.0–0.5)
Eosinophils Relative: 3 %
HCT: 45.3 % (ref 39.0–52.0)
Hemoglobin: 16.3 g/dL (ref 13.0–17.0)
Immature Granulocytes: 1 %
Lymphocytes Relative: 30 %
Lymphs Abs: 3.1 K/uL (ref 0.7–4.0)
MCH: 32.6 pg (ref 26.0–34.0)
MCHC: 36 g/dL (ref 30.0–36.0)
MCV: 90.6 fL (ref 80.0–100.0)
Monocytes Absolute: 1.1 K/uL — ABNORMAL HIGH (ref 0.1–1.0)
Monocytes Relative: 11 %
Neutro Abs: 5.6 K/uL (ref 1.7–7.7)
Neutrophils Relative %: 54 %
Platelets: 263 K/uL (ref 150–400)
RBC: 5 MIL/uL (ref 4.22–5.81)
RDW: 11.9 % (ref 11.5–15.5)
WBC: 10.1 K/uL (ref 4.0–10.5)
nRBC: 0 % (ref 0.0–0.2)

## 2019-12-25 LAB — BASIC METABOLIC PANEL
Anion gap: 9 (ref 5–15)
BUN: 13 mg/dL (ref 6–20)
CO2: 27 mmol/L (ref 22–32)
Calcium: 9 mg/dL (ref 8.9–10.3)
Chloride: 102 mmol/L (ref 98–111)
Creatinine, Ser: 0.61 mg/dL (ref 0.61–1.24)
GFR calc Af Amer: >60 mL/min (ref >60)
GFR calc non Af Amer: >60 mL/min (ref >60)
Glucose, Bld: 148 mg/dL — ABNORMAL HIGH (ref 70–99)
Potassium: 4.3 mmol/L (ref 3.5–5.1)
Sodium: 138 mmol/L (ref 135–145)

## 2019-12-25 LAB — URIC ACID: Uric Acid, Serum: 8.8 mg/dL — ABNORMAL HIGH (ref 3.7–8.6)

## 2019-12-25 MED ORDER — HYDROCODONE-ACETAMINOPHEN 5-325 MG PO TABS
1.0000 | ORAL_TABLET | Freq: Two times a day (BID) | ORAL | 0 refills | Status: DC | PRN
Start: 2019-12-25 — End: 2020-02-03

## 2019-12-25 MED ORDER — COLCHICINE 0.6 MG PO TABS: 0.6000 mg | ORAL_TABLET | Freq: Every day | ORAL | 0 refills | Status: DC

## 2019-12-25 MED ORDER — MELOXICAM 15 MG PO TABS: 15.0000 mg | ORAL_TABLET | Freq: Every day | ORAL | 0 refills | Status: AC

## 2019-12-25 MED ORDER — COLCHICINE 0.6 MG PO TABS
1.2000 mg | ORAL_TABLET | Freq: Once | ORAL | Status: AC
  Administered 2019-12-25: 1.2 mg via ORAL
  Filled 2019-12-25: qty 2

## 2019-12-25 NOTE — Discharge Instructions (Signed)
Your exam, labs, and [place] are essentially normal at this time. Your uric acid is elevated at 8.8. Take the prescription meds as directed. Follow-up with your provider for continued management.

## 2019-12-25 NOTE — ED Notes (Signed)
See triage noted States he developed pain with some swelling leg/calf  States he noticed this about 3 weeks ago   No injury  States he developed swelling after standing  Has been seen and x-rayed per PCP  Sent here to r/o clot

## 2019-12-25 NOTE — ED Triage Notes (Signed)
Sent from urgent care for pain left leg/ankel for 3 weeks with swelling after ambulating.  Here for possible blood clot

## 2020-01-05 ENCOUNTER — Other Ambulatory Visit: Payer: Self-pay | Admitting: Internal Medicine

## 2020-01-05 ENCOUNTER — Other Ambulatory Visit: Payer: Self-pay | Admitting: Physician Assistant

## 2020-01-18 ENCOUNTER — Encounter: Payer: Self-pay | Admitting: Physician Assistant

## 2020-01-18 ENCOUNTER — Ambulatory Visit (INDEPENDENT_AMBULATORY_CARE_PROVIDER_SITE_OTHER): Payer: Medicare Other | Admitting: Physician Assistant

## 2020-01-18 ENCOUNTER — Other Ambulatory Visit: Payer: Self-pay

## 2020-01-18 ENCOUNTER — Telehealth: Payer: Self-pay | Admitting: Physician Assistant

## 2020-01-18 VITALS — BP 152/98 | HR 91 | Ht 68.0 in | Wt 227.7 lb

## 2020-01-18 DIAGNOSIS — E1159 Type 2 diabetes mellitus with other circulatory complications: Secondary | ICD-10-CM

## 2020-01-18 DIAGNOSIS — M109 Gout, unspecified: Secondary | ICD-10-CM

## 2020-01-18 DIAGNOSIS — I152 Hypertension secondary to endocrine disorders: Secondary | ICD-10-CM

## 2020-01-18 DIAGNOSIS — Z794 Long term (current) use of insulin: Secondary | ICD-10-CM | POA: Diagnosis not present

## 2020-01-18 DIAGNOSIS — E1169 Type 2 diabetes mellitus with other specified complication: Secondary | ICD-10-CM

## 2020-01-18 DIAGNOSIS — I160 Hypertensive urgency: Secondary | ICD-10-CM | POA: Diagnosis not present

## 2020-01-18 DIAGNOSIS — I10 Essential (primary) hypertension: Secondary | ICD-10-CM

## 2020-01-18 LAB — POCT GLYCOSYLATED HEMOGLOBIN (HGB A1C): Hemoglobin A1C: 6.4 % — AB (ref 4.0–5.6)

## 2020-01-18 MED ORDER — OLMESARTAN MEDOXOMIL 40 MG PO TABS: 40.0000 mg | ORAL_TABLET | Freq: Every day | ORAL | 0 refills | Status: DC

## 2020-01-18 MED ORDER — AMLODIPINE BESYLATE 10 MG PO TABS: 10.0000 mg | ORAL_TABLET | Freq: Every day | ORAL | 0 refills | Status: DC

## 2020-01-18 MED ORDER — COLCHICINE 0.6 MG PO TABS
0.6000 mg | ORAL_TABLET | Freq: Every day | ORAL | 0 refills | Status: DC
Start: 2020-01-18 — End: 2020-02-03

## 2020-01-18 NOTE — Patient Instructions (Signed)

## 2020-01-18 NOTE — Addendum Note (Signed)
Addended by: Mickel Crow on: 01/18/2020 12:14 PM   Modules accepted: Orders

## 2020-01-18 NOTE — Telephone Encounter (Signed)
Patient was seen for OV today and forgot to mention he needed a refill of his gout med. Please advise if we can place that refill order.

## 2020-01-18 NOTE — Telephone Encounter (Signed)
Medication sent to requested pharmacy. AS, CMA 

## 2020-01-18 NOTE — Progress Notes (Signed)
Established Patient Office Visit  Subjective:  Patient ID: Louis Carney, male    DOB: 22-May-1980  Age: 39 y.o. MRN: [id number]  CC:  Chief Complaint  Patient presents with  . Hypertension  . Hyperlipidemia  . Diabetes    HPI [name] presents for follow up on hypertension, hyperlipidemia, and diabetes mellitus. Pt reports he needs blood work for dermatology for management of psoriasis. However, no lab orders are on file.    HTN: Pt denies chest pain, palpitations, dizziness, headache, vision changes, or lower extremity swelling. States he takes his blood pressure medication on and off because medication makes him very tired. He also has chronic back pain which makes his blood pressure to increase. Reports he has seen [facility] in the past but has not been able to return due to an outstanding balance that needs to paid.   Alcohol use: Reports he has reduce his alcohol consumption.  HLD: Pt taking medication as directed without issues. Denies side effects including myalgias and RUQ pain.   DM: Followed by Endocrinology. Reports he is only taking Ozempic.  Gout: States a few weeks was evaluated by [facility] ED and started on gout treatment. Symptoms have improved but continues to have mild ankle pain. Denies prior history of gout.   Past Medical History:  Diagnosis Date  . Arthritis   . Diabetes ([place])    type 1  . GERD (gastroesophageal reflux disease)   . Headache    migraines  . Hypertension   . Psoriasis    since age 19 years  . Tuberculosis    latent TB infection on treatment    Past Surgical History:  Procedure Laterality Date  . CARPAL TUNNEL RELEASE Right 01/15/2018   Procedure: Right Carpal tunnel release;  Surgeon: Ashok Pall, MD;  Location: [facility];  Service: Neurosurgery;  Laterality: Right;  Right Carpal tunnel release  . CERVICAL SPINE SURGERY    . LUMBAR LAMINECTOMY/DECOMPRESSION MICRODISCECTOMY Right 03/07/2018   Procedure: Right Lumbar 5  Sacral 1 Microdiscectomy;  Surgeon: Ashok Pall, MD;  Location: [facility];  Service: Neurosurgery;  Laterality: Right;  Right Lumbar 5 Sacral 1 Microdiscectomy  . NECK SURGERY      Family History  Problem Relation Age of Onset  . Alcohol abuse Mother   . Hypertension Mother   . Lung cancer Mother   . Alcohol abuse Father   . Hypertension Father   . Colon cancer Father        dx in his late 20's  . Esophageal cancer Neg Hx   . Rectal cancer Neg Hx     Social History   Socioeconomic History  . Marital status: Married    Spouse name: Not on file  . Number of children: 5  . Years of education: Not on file  . Highest education level: Not on file  Occupational History  . Occupation: disabled  Tobacco Use  . Smoking status: Current Every Day Smoker    Packs/day: 1.00    Years: 15.00    Pack years: 15.00    Types: Cigarettes  . Smokeless tobacco: Never Used  Vaping Use  . Vaping Use: Never used  Substance and Sexual Activity  . Alcohol use: Yes    Alcohol/week: 24.0 standard drinks    Types: 24 Standard drinks or equivalent per week  . Drug use: Never  . Sexual activity: Yes    Partners: Female    Birth control/protection: None  Other Topics Concern  . 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 Difficulty of Paying Living Expenses: Not on file  Food Insecurity:   . Worried About Charity fundraiser in the Last Year: Not on file  . Ran Out of Food in the Last Year: Not on file  Transportation Needs:   . Lack of Transportation (Medical): Not on file  . Lack of Transportation (Non-Medical): Not on file  Physical Activity:   . Days of Exercise per Week: Not on file  . Minutes of Exercise per  Session: Not on file  Stress:   . Feeling of Stress : Not on file  Social Connections:   . Frequency of Communication with Friends and Family: Not on file  . Frequency of Social Gatherings with Friends and Family: Not on file  . Attends Religious Services: Not on file  . Active Member of Clubs or Organizations: Not on file  . Attends Archivist Meetings: Not on file  . Marital Status: Not on file  Intimate Partner Violence:   . Fear of Current or Ex-Partner: Not on file  . Emotionally Abused: Not on file  . Physically Abused: Not on file  . Sexually Abused: Not on file    Outpatient Medications Prior to Visit  Medication Sig Dispense Refill  . blood glucose meter kit and supplies Dispense based on patient and insurance preference. Use to check glucose level three times daily before meals and 3 times daily 2 hours after meals (6 times daily total). (FOR ICD-10 E10.9, E11.9). 1 each 0  . esomeprazole (NEXIUM) 20 MG capsule Take 20 mg by mouth daily at 12 noon.    . gabapentin (NEURONTIN) 300 MG capsule Take 2 capsules (600 mg total) by mouth 2 (two) times daily. 360 capsule 0  . HYDROcodone-acetaminophen (NORCO) 5-325 MG tablet Take 1 tablet by mouth 2 (two) times daily as needed. 10 tablet 0  . ibuprofen (ADVIL) 200 MG tablet Take 800 mg by mouth 2 (two) times daily.    . insulin aspart (NOVOLOG FLEXPEN) 100 UNIT/ML FlexPen Inject 10-14 Units into the skin 3 (three) times daily with meals. 15 mL 11  . insulin glargine (LANTUS SOLOSTAR) 100 UNIT/ML Solostar Pen Inject 8 units into the skin twice daily-12 hours apart. 5 pen PRN  . meloxicam (MOBIC) 15 MG tablet Take 1 tablet (15 mg total) by mouth daily with lunch. 60 tablet 0  . neomycin-polymyxin b-dexamethasone (MAXITROL) 3.5-10000-0.1 OINT SMARTSIG:Sparingly In Eye(s) Twice Daily    . OneTouch Delica Lancets 64R MISC USE TO TEST BLOOD SUGARS 6 TIMES DAILY (BEFORE MEALS AND 2 HOURS AFTER MEALS) 200 each 0  . OneTouch Delica  Lancets 83K MISC USE 1  TO CHECK GLUCOSE SIX TIMES DAILY (BEFORE  MEALS  AND  TWO  HOURS  AFTER  MEALS) 200 each 0  . ONETOUCH VERIO test strip USE TO TEST 6 TIMES DAILY (BEFORE MEALS AND 2 HOURS AFTER MEALS) 200 each 0  . OZEMPIC, 0.25 OR 0.5 MG/DOSE, 2 MG/1.5ML SOPN INJECT 0.5MG INTO THE SKIN ONCE WEEKLY 2 mL 0  . Risankizumab-rzaa,150 MG Dose, (SKYRIZI, 150 MG DOSE,) 75 MG/0.83ML PSKT Inject 300 mg into the skin every  3 (three) months. 150 mg (1 injection) in each leg every 3 months    . sildenafil (VIAGRA) 100 MG tablet TAKE 1/2 TO 1 TABLET BY MOUTH 30 MINUTES PRIOR TO INTERCOURSE AS NEEDED. 30 tablet 0  . colchicine 0.6 MG tablet Take 1 tablet (0.6 mg total) by mouth daily. 10 tablet 0  . Olmesartan-amLODIPine-HCTZ 40-10-25 MG TABS Take 1 tablet by mouth daily. 1QD 90 tablet 0   No facility-administered medications prior to visit.    No Known Allergies  ROS Review of Systems A fourteen system review of systems was performed and found to be positive as per HPI.  Objective:    Physical Exam General:  Well Developed, appropriate for stated age, in no acute distress Neuro:  Alert and oriented,  extra-ocular muscles intact, no focal deficits  HEENT:  Normocephalic, atraumatic, neck supple, no carotid bruits appreciated, no JVD   Skin:  no gross rash, warm, pink. Cardiac:  RRR, S1 S2 Respiratory:  ECTA B/L and A/P, Not using accessory muscles, speaking in full sentences- unlabored. Vascular:  Ext warm, no cyanosis apprec.; cap RF less 2 sec. Psych:  No HI/SI, judgement and insight good, Euthymic mood. Full Affect.   BP (!) 152/98   Pulse 91   Ht '5\' 8"'  (1.727 m)   Wt 227 lb 11.2 oz (103.3 kg)   SpO2 91%   BMI 34.62 kg/m  Wt Readings from Last 3 Encounters:  01/18/20 227 lb 11.2 oz (103.3 kg)  12/25/19 220 lb (99.8 kg)  08/21/19 232 lb (105.2 kg)     Health Maintenance Due  Topic Date Due  . COVID-19 Vaccine (1) Never done  . TETANUS/TDAP  Never done  . INFLUENZA VACCINE   Never done    There are no preventive care reminders to display for this patient.  Lab Results  Component Value Date   TSH 1.320 10/27/2018   Lab Results  Component Value Date   WBC 10.1 12/25/2019   HGB 16.3 12/25/2019   HCT 45.3 12/25/2019   MCV 90.6 12/25/2019   PLT 263 12/25/2019   Lab Results  Component Value Date   NA 138 12/25/2019   K 4.3 12/25/2019   CO2 27 12/25/2019   GLUCOSE 148 (H) 12/25/2019   BUN 13 12/25/2019   CREATININE 0.61 12/25/2019   BILITOT 0.7 01/20/2019   ALKPHOS 50 01/20/2019   AST 119 (H) 01/20/2019   ALT 102 (H) 01/20/2019   PROT 7.5 01/20/2019   ALBUMIN 4.4 01/20/2019   CALCIUM 9.0 12/25/2019   ANIONGAP 9 12/25/2019   Lab Results  Component Value Date   CHOL 267 (H) 01/20/2019   Lab Results  Component Value Date   HDL 32.90 (L) 01/20/2019   Lab Results  Component Value Date   LDLCALC Comment 10/27/2018   Lab Results  Component Value Date   TRIG 386.0 (H) 01/20/2019   Lab Results  Component Value Date   CHOLHDL 8 01/20/2019   Lab Results  Component Value Date   HGBA1C 6.4 (A) 01/18/2020      Assessment & Plan:   Problem List Items Addressed This Visit      Cardiovascular and Mediastinum   Hypertension associated with diabetes ([place])   Relevant Medications   olmesartan (BENICAR) 40 MG tablet   amLODipine (NORVASC) 10 MG tablet     Endocrine   Diabetes mellitus (HCC)   Relevant Medications   olmesartan (BENICAR) 40 MG tablet   Other Relevant Orders   POCT glycosylated hemoglobin (  Hb A1C) (Completed)    Other Visit Diagnoses    Hypertensive urgency    -  Primary   Relevant Medications   olmesartan (BENICAR) 40 MG tablet   amLODipine (NORVASC) 10 MG tablet   Gout, unspecified cause, unspecified chronicity, unspecified site         Hypertension associated with diabetes, Hypertensive urgency: -On arrival BP significantly elevated, BP recheck improved but still above goal. -Discussed case with SP Dr. Madilyn Fireman  and since patient is asymptomatic, currently is in pain, BP stable prior visit, and reports poor medication compliance ok to discharge home as outpatient. In addition, patient declined going to ED. -Discussed with patient potential complications from uncontrolled hypertension and the importance of medication compliance to maintain blood pressure stable. Will separate triple combination medication and send olmesartan and amlodipine separately. Will hold off on HCTZ to see if possibly contributing to fatigue symptoms. Instructed to return for nurse visit for BP recheck later this week and will make additional adjustments to medication pending BP reading. Recommend to check BP and pulse at home, and keep a log. -Follow up in 2 weeks  Diabetes Mellitus: - A1c today is 6.4, stable. Follow up with endocrinology. - Continue current medication regimen. - Recommend ambulatory glucose monitoring. - Follow a low glucose/carbohydrate diet and stay as active as possible.  Alcohol, Gout:  -Recommend to continue with alcohol reduction to reduce risk for gout exacerbations. Diuretics can cause hyperuricemia so will continue to hold. -If patient continues to experience gout exacerbations then prophylaxis management should be considered.   Meds ordered this encounter  Medications  . olmesartan (BENICAR) 40 MG tablet    Sig: Take 1 tablet (40 mg total) by mouth daily.    Dispense:  30 tablet    Refill:  0    Order Specific Question:   Supervising Provider    Answer:   Beatrice Lecher D [[id number]]  . amLODipine (NORVASC) 10 MG tablet    Sig: Take 1 tablet (10 mg total) by mouth daily.    Dispense:  30 tablet    Refill:  0    Order Specific Question:   Supervising Provider    Answer:   Beatrice Lecher D [[id number]]    Follow-up: Return for HTN-med adjusted, in 2 weeks (telemed ok); Nurse visit Thursday for BP check.   Note:  This note was prepared with assistance of Dragon voice recognition software.  Occasional wrong-word or sound-a-like substitutions may have occurred due to the inherent limitations of voice recognition software.   Lorrene Reid, PA-C

## 2020-01-19 ENCOUNTER — Other Ambulatory Visit: Payer: Self-pay | Admitting: Physician Assistant

## 2020-01-21 ENCOUNTER — Other Ambulatory Visit: Payer: Self-pay | Admitting: Physician Assistant

## 2020-01-28 ENCOUNTER — Other Ambulatory Visit: Payer: Self-pay | Admitting: *Deleted

## 2020-01-28 ENCOUNTER — Other Ambulatory Visit: Payer: Self-pay | Admitting: Physician Assistant

## 2020-01-28 MED ORDER — SKYRIZI PEN 150 MG/ML ~~LOC~~ SOAJ: 150.0000 mg | SUBCUTANEOUS | 3 refills | Status: DC

## 2020-01-28 NOTE — Progress Notes (Signed)
Updated patients skyrizi dosage to the 150 mg. Sent to [facility].

## 2020-02-01 ENCOUNTER — Telehealth: Payer: Self-pay | Admitting: *Deleted

## 2020-02-02 ENCOUNTER — Telehealth: Payer: Self-pay | Admitting: Physician Assistant

## 2020-02-02 ENCOUNTER — Other Ambulatory Visit: Payer: Self-pay | Admitting: Physician Assistant

## 2020-02-02 NOTE — Telephone Encounter (Signed)
LVM for pt to call to discuss refusal of refill.  Per last OV note:  Follow-up: Return for HTN-med adjusted, in 2 weeks (telemed ok); Nurse visit Thursday for BP check.   Pt does not currently have an appt scheduled.  Charyl Bigger, CMA

## 2020-02-02 NOTE — Telephone Encounter (Signed)
Patient called in stating he had a voicemail left for him possibly refill for a prescription. Thanks

## 2020-02-02 NOTE — Telephone Encounter (Signed)
Patient requesting refill of Viagra. Patient is aware we have declined refill due to his elevated BP. Patient was supposed to follow up in 2 wks from last apt and was non-compliant.    Patient call transferred to front desk to schedule. AS< CMA

## 2020-02-02 NOTE — Telephone Encounter (Signed)
Please review refill request since, at last OV, pt had "hypertensive urgency".  Charyl Bigger, CMA

## 2020-02-03 ENCOUNTER — Other Ambulatory Visit: Payer: Self-pay

## 2020-02-03 ENCOUNTER — Encounter: Payer: Self-pay | Admitting: Physician Assistant

## 2020-02-03 ENCOUNTER — Ambulatory Visit (INDEPENDENT_AMBULATORY_CARE_PROVIDER_SITE_OTHER): Payer: Medicare Other | Admitting: Physician Assistant

## 2020-02-03 ENCOUNTER — Ambulatory Visit: Payer: Medicare Other | Admitting: Physician Assistant

## 2020-02-03 VITALS — BP 146/88 | HR 93 | Ht 68.0 in | Wt 226.0 lb

## 2020-02-03 DIAGNOSIS — Z7141 Alcohol abuse counseling and surveillance of alcoholic: Secondary | ICD-10-CM

## 2020-02-03 DIAGNOSIS — E1159 Type 2 diabetes mellitus with other circulatory complications: Secondary | ICD-10-CM

## 2020-02-03 DIAGNOSIS — F17210 Nicotine dependence, cigarettes, uncomplicated: Secondary | ICD-10-CM | POA: Diagnosis not present

## 2020-02-03 DIAGNOSIS — N529 Male erectile dysfunction, unspecified: Secondary | ICD-10-CM

## 2020-02-03 DIAGNOSIS — F1010 Alcohol abuse, uncomplicated: Secondary | ICD-10-CM

## 2020-02-03 DIAGNOSIS — Z716 Tobacco abuse counseling: Secondary | ICD-10-CM

## 2020-02-03 DIAGNOSIS — I152 Hypertension secondary to endocrine disorders: Secondary | ICD-10-CM

## 2020-02-03 MED ORDER — METOPROLOL SUCCINATE ER 50 MG PO TB24: 50.0000 mg | ORAL_TABLET | Freq: Every day | ORAL | 2 refills | Status: DC

## 2020-02-03 NOTE — Progress Notes (Signed)
Telehealth office visit note for Louis Reid, PA-C- at Primary Care at [facility]   I connected with current patient today by telephone and verified that I am speaking with the correct person   . Location of the patient: Home . Location of the provider: Office - This visit type was conducted due to national recommendations for restrictions regarding the COVID-19 Pandemic (e.g. social distancing) in an effort to limit this patient's exposure and mitigate transmission in our community.    - No physical exam could be performed with this format, beyond that communicated to [place] by the patient/ family members as noted.   - Additionally my office staff/ schedulers were to discuss with the patient that there may be a monetary charge related to this service, depending on their medical insurance.  My understanding is that patient understood and consented to proceed.     _________________________________________________________________________________   History of Present Illness: Pt calls in to follow up on hypertension. Pt has been checking BP at home and reports today's reading has been the best so far. BP readings range 140-180/80-90 but states has noticed BP gradually improving and lower. He has started taking his BP medications at night which seems to work better. Asymptomatic. States he is trying to reduce alcohol. He was a heavy drinker and has cut back to only drinking a few beers on the weekends and eventually wants to quit. He has also reduced his smoking.      GAD 7 : Generalized Anxiety Score 09/09/2018  Nervous, Anxious, on Edge 0  Control/stop worrying 0  Worry too much - different things 0  Trouble relaxing 0  Restless 0  Easily annoyed or irritable 0  Afraid - awful might happen 0  Total GAD 7 Score 0  Anxiety Difficulty Not difficult at all    Depression screen [facility] 2/9 02/03/2020 01/18/2020 08/13/2019 05/28/2019 02/05/2019  Decreased Interest 0 2 0 2 1  Down, Depressed,  Hopeless 0 1 0 0 0  PHQ - 2 Score 0 3 0 2 1  Altered sleeping _0 Tired, decreased energy 3 1 0 1 1  Change in appetite 0 0 0 0 0  Feeling bad or failure about yourself  0 0 0 0 0  Trouble concentrating 0 0 0 2 0  Moving slowly or fidgety/restless 0 0 0 0 0  Suicidal thoughts 0 0 0 0 0  PHQ-9 Score _1 Difficult doing work/chores Somewhat difficult Not difficult at all Not difficult at all Not difficult at all Not difficult at all  Some recent data might be hidden      Impression and Recommendations:     1. Hypertension associated with diabetes ([place])   2. Erectile dysfunction, unspecified erectile dysfunction type   3. Alcohol abuse   4. Cigarette smoker- 3 ppd for 6 yrs, then 1ppd for 9 yrs- 27 pk yr hx   5. Encounter for tobacco use cessation counseling   6. Alcohol cessation counseling     Hypertension associated with diabetes: -BP is improving but continues to have some elevated BP readings and per chart review pulse tends to be around 90s so will add Metoprolol 50 mg to help with BP and pulse. HCTZ was discontinued due to patient's complaint of fatigue after taking triple combo antihypertensive so will try to avoid diuretic.  -Continue ambulatory BP and pulse monitoring. -Follow up in 2 weeks to reassess BP and  medication therapy.  ED: -Discussed with patient potential risks of PDE inhibitors with uncontrolled hypertension and will not provide refills until BP is stable and under better control. Pt verbalized understanding.   Alcohol abuse, Alcohol cessation counseling: -Encouraged patient to continue with reducing alcohol use, which will help improve chronic conditions including hypertension and various resources are available such as AA or outpatient rehabilitation programs. -Will continue to monitor.  Smoking use, Tobacco use cessation counseling: -Encouraged to continue with reducing smoking use and eventually be able to quit. -Patient was counseled on  the dangers of tobacco use, and was advised to quit.  Reviewed strategies to maximize success, including removing cigarettes and smoking materials from environment and stress management.    - As part of my medical decision making, I reviewed the following data within the [place] History obtained from pt /family, CMA notes reviewed and incorporated if applicable, Labs reviewed, Radiograph/ tests reviewed if applicable and OV notes from prior OV's with me, as well as any other specialists she/he has seen since seeing me last, were all reviewed and used in my medical decision making process today.    - Additionally, when appropriate, discussion had with patient regarding our treatment plan, and their biases/concerns about that plan were used in my medical decision making today.    - The patient agreed with the plan and demonstrated an understanding of the instructions.   No barriers to understanding were identified.     - The patient was advised to call back or seek an in-person evaluation if the symptoms worsen or if the condition fails to improve as anticipated.   Return in about 2 weeks (around 02/17/2020) for HTN- added new med.    No orders of the defined types were placed in this encounter.   Meds ordered this encounter  Medications  . metoprolol succinate (TOPROL-XL) 50 MG 24 hr tablet    Sig: Take 1 tablet (50 mg total) by mouth daily. Take with or immediately following a meal.    Dispense:  30 tablet    Refill:  2    Order Specific Question:   Supervising Provider    Answer:   Beatrice Lecher D [[id number]]    Medications Discontinued During This Encounter  Medication Reason  . HYDROcodone-acetaminophen (NORCO) 5-325 MG tablet Patient Preference  . Risankizumab-rzaa,150 MG Dose, (SKYRIZI, 150 MG DOSE,) 75 MG/0.83ML PSKT Patient Preference       Time spent on visit including pre-visit chart review and post-visit care was 10 minutes.      The [place] was signed into law in 2016 which includes the topic of electronic health records.  This provides immediate access to information in MyChart.  This includes consultation notes, operative notes, office notes, lab results and pathology reports.  If you have any questions about what you read please let us know at your next visit or call us at the office.  We are right here with you.   __________________________________________________________________________________     Patient Care Team    Relationship Specialty Notifications Start End  Louis Carney, [place] PCP - General   08/30/19   Arlyss Gandy, PA-C  Dermatology  07/31/18    Comment: Rocco Serene, MD Consulting Physician Neurosurgery  07/31/18   Thayer Headings, MD Consulting Physician Infectious Diseases  07/31/18   Philemon Kingdom, MD Consulting Physician Endocrinology  12/25/18      -Vitals obtained; medications/ allergies reconciled;  personal medical, social, Sx  etc.histories were updated by CMA, reviewed by me and are reflected in chart   Patient Active Problem List   Diagnosis Date Noted  . Diabetes mellitus ([place]) 05/28/2019  . Morbid obesity ([place]) 05/28/2019  . Alcoholic hepatitis without ascites 05/28/2019  . Sprain of calcaneofibular ligament of ankle, initial encounter- L-  pt refused xrays of ankle 05/28/2019  . Alcohol abuse with alcohol-induced disorder ([place]) 02/07/2019  . Hepatitis 02/05/2019  . Adjustment disorder with mixed anxiety and depressed mood 02/05/2019  . Hepatocellular damage 11/07/2018  . Elevated liver function tests 11/07/2018  . Nausea 11/07/2018  . Alcohol abuse 09/09/2018  . HNP (herniated nucleus pulposus), lumbar 03/07/2018  . Transaminitis 01/29/2018  . Medication monitoring encounter 11/29/2017  . TB lung, latent 11/06/2017  . Family history of diabetes mellitus in father 09/12/2017  . High risk medication use 09/12/2017  . Vitamin D deficiency 09/12/2017   . Hyperlipidemia associated with type 2 diabetes mellitus ([place]) 09/12/2017  . Low level of high density lipoprotein (HDL) 09/12/2017  . Hypertriglyceridemia 09/12/2017  . Psoriasis-onset age 38 has needed Cosentyx in past has failed Enbrel and Humira 08/26/2017  . Chronic radicular low back pain- R sided 08/26/2017  . History of excision of lamina of cervical vertebra for decompression of sp- C4-6inal cord 08/26/2017  . 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 RSD (reflex sympathetic dystrophy)- right entire leg painful to non-noxious stimuli 08/26/2017  . Cigarette smoker- 3 ppd for 6 yrs, then 1ppd for 9 yrs- 27 pk yr hx 08/26/2017  . Drinks beer- 6-8 beers/d on ave ( 10 Yrs or so) 08/26/2017  . Hypertension associated with diabetes ([place]) 08/26/2017  . NSAID long-term use-4 ibuprofen 3 times daily times at least 5 years 08/26/2017  . 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 Chronic fatigue 08/26/2017  . Mood disorder ([place])- secondary to chronic pain syndrome 08/26/2017  . Insomnia 08/26/2017  . Environmental and seasonal allergies 08/26/2017  . ED (erectile dysfunction) 08/26/2017  . Allergic conjunctivitis of both eyes and rhinitis 08/26/2017  . Class 1 obesity 08/26/2017  . Plaque psoriasis 08/26/2017     Current Meds  Medication Sig  . amLODipine (NORVASC) 10 MG tablet Take 1 tablet (10 mg total) by mouth daily.  . blood glucose meter kit and supplies Dispense based on patient and insurance preference. Use to check glucose level three times daily before meals and 3 times daily 2 hours after meals (6 times daily total). (FOR ICD-10 E10.9, E11.9).  Marland Kitchen esomeprazole (NEXIUM) 20 MG capsule Take 20 mg by mouth daily at 12 noon.  . gabapentin (NEURONTIN) 300 MG capsule Take 2 capsules (600 mg total) by mouth 2 (two) times daily. (Patient taking differently: Take 600 mg by mouth daily. )  .  ibuprofen (ADVIL) 200 MG tablet Take 800 mg by mouth 2 (two) times daily.  Marland Kitchen neomycin-polymyxin b-dexamethasone (MAXITROL) 3.5-10000-0.1 OINT SMARTSIG:Sparingly In Eye(s) Twice Daily  . olmesartan (BENICAR) 40 MG tablet Take 1 tablet (40 mg total) by mouth daily.  Marland Kitchen OZEMPIC, 0.25 OR 0.5 MG/DOSE, 2 MG/1.5ML SOPN INJECT 0.5MG INTO THE SKIN ONCE WEEKLY  . Risankizumab-rzaa (SKYRIZI PEN) 150 MG/ML SOAJ Inject 150 mg into the skin as directed. Every 12 weeks for maintenance.     Allergies:  No Known Allergies   ROS:  See above HPI for pertinent positives and negatives   Objective:   Blood pressure (!) 146/88, pulse 93, height _0  (  1.727 m), weight 226 lb (102.5 kg).  (if some vitals are omitted, this means that patient was UNABLE to obtain them even though they were asked to get them prior to OV today.  They were asked to call us at their earliest convenience with these once obtained. ) General: A & O * 3; sounds in no acute distress Respiratory: speaking in full sentences, no conversational dyspnea Psych: insight appears good, mood- appears full

## 2020-02-03 NOTE — Telephone Encounter (Signed)
Pt has appt 02/03/2020 at [facility], CMA

## 2020-02-17 ENCOUNTER — Other Ambulatory Visit: Payer: Self-pay

## 2020-02-17 ENCOUNTER — Encounter: Payer: Self-pay | Admitting: Physician Assistant

## 2020-02-17 ENCOUNTER — Ambulatory Visit (INDEPENDENT_AMBULATORY_CARE_PROVIDER_SITE_OTHER): Payer: Medicare Other | Admitting: Physician Assistant

## 2020-02-17 VITALS — BP 141/88 | HR 79 | Temp 97.0°F | Ht 68.0 in | Wt 222.0 lb

## 2020-02-17 DIAGNOSIS — K5900 Constipation, unspecified: Secondary | ICD-10-CM

## 2020-02-17 DIAGNOSIS — E1159 Type 2 diabetes mellitus with other circulatory complications: Secondary | ICD-10-CM | POA: Diagnosis not present

## 2020-02-17 DIAGNOSIS — I152 Hypertension secondary to endocrine disorders: Secondary | ICD-10-CM

## 2020-02-17 MED ORDER — METOPROLOL SUCCINATE ER 100 MG PO TB24: 100.0000 mg | ORAL_TABLET | Freq: Every day | ORAL | 2 refills | Status: DC

## 2020-02-17 NOTE — Progress Notes (Signed)
Telehealth office visit note for Louis Reid, PA-C- at Primary Care at [facility]   I connected with current patient today by telephone and verified that I am speaking with the correct person   . Location of the patient: Home . Location of the provider: Office - This visit type was conducted due to national recommendations for restrictions regarding the COVID-19 Pandemic (e.g. social distancing) in an effort to limit this patient's exposure and mitigate transmission in our community.    - No physical exam could be performed with this format, beyond that communicated to [place] by the patient/ family members as noted.   - Additionally my office staff/ schedulers were to discuss with the patient that there may be a monetary charge related to this service, depending on their medical insurance.  My understanding is that patient understood and consented to proceed.     _________________________________________________________________________________   History of Present Illness: Pt calls in to follow up on hypertension.  HTN: Pt denies chest pain, palpitations, dizziness or leg swelling. Taking medication as directed without side effects. At last telehealth visit Metoprolol was added on to antihypertensive regimen. Continues to check BP at home and readings range in 140-150/80-90s, highest reading 160/93 pulse ranges 70-90 . Pt follows a low salt diet. Reports has experienced some constipation and has been taking Metamucil and staying hydrated, which has improved constipation.  Reports symptoms started after starting metoprolol.      GAD 7 : Generalized Anxiety Score 09/09/2018  Nervous, Anxious, on Edge 0  Control/stop worrying 0  Worry too much - different things 0  Trouble relaxing 0  Restless 0  Easily annoyed or irritable 0  Afraid - awful might happen 0  Total GAD 7 Score 0  Anxiety Difficulty Not difficult at all    Depression screen [facility] 2/9 02/17/2020 02/03/2020 01/18/2020  08/13/2019 05/28/2019  Decreased Interest 0 0 2 0 2  Down, Depressed, Hopeless 0 0 1 0 0  PHQ - 2 Score 0 0 3 0 2  Altered sleeping '2 2 1 3 2  ' Tired, decreased energy '2 3 1 ' 0 1  Change in appetite 0 0 0 0 0  Feeling bad or failure about yourself  0 0 0 0 0  Trouble concentrating 0 0 0 0 2  Moving slowly or fidgety/restless 0 0 0 0 0  Suicidal thoughts 0 0 0 0 0  PHQ-9 Score '4 5 5 3 7  ' Difficult doing work/chores Not difficult at all Somewhat difficult Not difficult at all Not difficult at all Not difficult at all  Some recent data might be hidden      Impression and Recommendations:     1. Hypertension associated with diabetes ([place])   2. Constipation, unspecified constipation type     Hypertension associated with diabetes: -BP has improved but continues to be above goal, pulse is stable and on higher end of normal, so will increase metoprolol to 100 mg.  Advised patient to let me know if  unable to tolerate increased dose. -Continue amlodipine and olmesartan. -Continue ambulatory BP and pulse monitoring, and keep a log. -Follow low-sodium diet. -Follow-up in 2 weeks to reassess blood pressure and medication therapy.  Constipation: -Symptom could possibly be related to medication side effect. Diarrhea is usually a more common side effect with metoprolol than constipation. -Recommend to continue with Metamucil, good hydration and increased dietary fiber.   - As part of my medical decision making, I reviewed the following  data within the [place] History obtained from pt /family, CMA notes reviewed and incorporated if applicable, Labs reviewed, Radiograph/ tests reviewed if applicable and OV notes from prior OV's with me, as well as any other specialists she/he has seen since seeing me last, were all reviewed and used in my medical decision making process today.    - Additionally, when appropriate, discussion had with patient regarding our treatment plan, and their  biases/concerns about that plan were used in my medical decision making today.    - The patient agreed with the plan and demonstrated an understanding of the instructions.   No barriers to understanding were identified.     - The patient was advised to call back or seek an in-person evaluation if the symptoms worsen or if the condition fails to improve as anticipated.   Return in about 2 weeks (around 03/02/2020) for HTN.    No orders of the defined types were placed in this encounter.   Meds ordered this encounter  Medications  . metoprolol succinate (TOPROL-XL) 100 MG 24 hr tablet    Sig: Take 1 tablet (100 mg total) by mouth daily. Take with or immediately following a meal.    Dispense:  30 tablet    Refill:  2    Order Specific Question:   Supervising Provider    Answer:   Beatrice Lecher D [[id number]]    Medications Discontinued During This Encounter  Medication Reason  . neomycin-polymyxin b-dexamethasone (MAXITROL) 3.5-10000-0.1 OINT Completed Course  . OneTouch Delica Lancets 04U MISC Duplicate  . metoprolol succinate (TOPROL-XL) 50 MG 24 hr tablet Dose change       Time spent on visit including pre-visit chart review and post-visit care was 10 minutes.      The [place] was signed into law in 2016 which includes the topic of electronic health records.  This provides immediate access to information in MyChart.  This includes consultation notes, operative notes, office notes, lab results and pathology reports.  If you have any questions about what you read please let us know at your next visit or call us at the office.  We are right here with you.  Note:  This note was prepared with assistance of Dragon voice recognition software. Occasional wrong-word or sound-a-like substitutions may have occurred due to the inherent limitations of voice recognition  software.  __________________________________________________________________________________     Patient Care Team    Relationship Specialty Notifications Start End  Louis Carney, [place] PCP - General   08/30/19   Arlyss Gandy, PA-C  Dermatology  07/31/18    Comment: Rocco Serene, MD Consulting Physician Neurosurgery  07/31/18   Thayer Headings, MD Consulting Physician Infectious Diseases  07/31/18   Philemon Kingdom, MD Consulting Physician Endocrinology  12/25/18      -Vitals obtained; medications/ allergies reconciled;  personal medical, social, Sx etc.histories were updated by CMA, reviewed by me and are reflected in chart   Patient Active Problem List   Diagnosis Date Noted  . Diabetes mellitus ([place]) 05/28/2019  . Morbid obesity ([place]) 05/28/2019  . Alcoholic hepatitis without ascites 05/28/2019  . Sprain of calcaneofibular ligament of ankle, initial encounter- L-  pt refused xrays of ankle 05/28/2019  . Alcohol abuse with alcohol-induced disorder ([place]) 02/07/2019  . Hepatitis 02/05/2019  . Adjustment disorder with mixed anxiety and depressed mood 02/05/2019  . Hepatocellular damage 11/07/2018  . Elevated liver function tests 11/07/2018  . Nausea 11/07/2018  .  Alcohol abuse 09/09/2018  . HNP (herniated nucleus pulposus), lumbar 03/07/2018  . Transaminitis 01/29/2018  . Medication monitoring encounter 11/29/2017  . TB lung, latent 11/06/2017  . Family history of diabetes mellitus in father 09/12/2017  . High risk medication use 09/12/2017  . Vitamin D deficiency 09/12/2017  . Hyperlipidemia associated with type 2 diabetes mellitus ([place]) 09/12/2017  . Low level of high density lipoprotein (HDL) 09/12/2017  . Hypertriglyceridemia 09/12/2017  . Psoriasis-onset age 84 has needed Cosentyx in past has failed Enbrel and Humira 08/26/2017  . Chronic radicular low back pain- R sided 08/26/2017  . History of excision of lamina of cervical vertebra for  decompression of sp- C4-6inal cord 08/26/2017  . 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 RSD (reflex sympathetic dystrophy)- right entire leg painful to non-noxious stimuli 08/26/2017  . Cigarette smoker- 3 ppd for 6 yrs, then 1ppd for 9 yrs- 27 pk yr hx 08/26/2017  . Drinks beer- 6-8 beers/d on ave ( 10 Yrs or so) 08/26/2017  . Hypertension associated with diabetes ([place]) 08/26/2017  . NSAID long-term use-4 ibuprofen 3 times daily times at least 5 years 08/26/2017  . 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 Chronic fatigue 08/26/2017  . Mood disorder ([place])- secondary to chronic pain syndrome 08/26/2017  . Insomnia 08/26/2017  . Environmental and seasonal allergies 08/26/2017  . ED (erectile dysfunction) 08/26/2017  . Allergic conjunctivitis of both eyes and rhinitis 08/26/2017  . Class 1 obesity 08/26/2017  . Plaque psoriasis 08/26/2017     Current Meds  Medication Sig  . amLODipine (NORVASC) 10 MG tablet Take 1 tablet (10 mg total) by mouth daily.  . blood glucose meter kit and supplies Dispense based on patient and insurance preference. Use to check glucose level three times daily before meals and 3 times daily 2 hours after meals (6 times daily total). (FOR ICD-10 E10.9, E11.9).  Marland Kitchen esomeprazole (NEXIUM) 20 MG capsule Take 20 mg by mouth daily at 12 noon.  . gabapentin (NEURONTIN) 300 MG capsule Take 2 capsules (600 mg total) by mouth 2 (two) times daily. (Patient taking differently: Take 600 mg by mouth daily. )  . ibuprofen (ADVIL) 200 MG tablet Take 800 mg by mouth 2 (two) times daily.  . insulin aspart (NOVOLOG FLEXPEN) 100 UNIT/ML FlexPen Inject 10-14 Units into the skin 3 (three) times daily with meals.  . insulin glargine (LANTUS SOLOSTAR) 100 UNIT/ML Solostar Pen Inject 8 units into the skin twice daily-12 hours apart.  . meloxicam (MOBIC) 15 MG tablet Take 1 tablet (15 mg total) by  mouth daily with lunch.  . olmesartan (BENICAR) 40 MG tablet Take 1 tablet (40 mg total) by mouth daily.  Glory Rosebush Delica Lancets 18F MISC USE TO TEST BLOOD SUGARS 6 TIMES DAILY (BEFORE MEALS AND 2 HOURS AFTER MEALS)  . ONETOUCH VERIO test strip USE TO TEST 6 TIMES DAILY (BEFORE MEALS AND 2 HOURS AFTER MEALS)  . OZEMPIC, 0.25 OR 0.5 MG/DOSE, 2 MG/1.5ML SOPN INJECT 0.5MG INTO THE SKIN ONCE WEEKLY  . Risankizumab-rzaa (SKYRIZI PEN) 150 MG/ML SOAJ Inject 150 mg into the skin as directed. Every 12 weeks for maintenance.  . sildenafil (VIAGRA) 100 MG tablet TAKE 1/2 TO 1 TABLET BY MOUTH 30 MINUTES PRIOR TO INTERCOURSE AS NEEDED.  . [DISCONTINUED] metoprolol succinate (TOPROL-XL) 50 MG 24 hr tablet Take 1 tablet (50 mg total) by mouth daily. Take with or immediately following a meal.  Allergies:  No Known Allergies   ROS:  See above HPI for pertinent positives and negatives   Objective:   Blood pressure (!) 141/88, pulse 79, temperature (!) 97 F (36.1 C), temperature source Oral, height '5\' 8"'  (1.727 m), weight 222 lb (100.7 kg).  (if some vitals are omitted, this means that patient was UNABLE to obtain them even though they were asked to get them prior to OV today.  They were asked to call us at their earliest convenience with these once obtained. ) General: A & O * 3; sounds in no acute distress Respiratory: speaking in full sentences, no conversational dyspnea Psych: insight appears good, mood- appears full

## 2020-02-22 ENCOUNTER — Other Ambulatory Visit: Payer: Self-pay | Admitting: Physician Assistant

## 2020-03-03 ENCOUNTER — Other Ambulatory Visit: Payer: Self-pay | Admitting: Internal Medicine

## 2020-03-07 ENCOUNTER — Ambulatory Visit (INDEPENDENT_AMBULATORY_CARE_PROVIDER_SITE_OTHER): Payer: Medicare Other | Admitting: Physician Assistant

## 2020-03-07 ENCOUNTER — Other Ambulatory Visit: Payer: Self-pay

## 2020-03-07 ENCOUNTER — Encounter: Payer: Self-pay | Admitting: Physician Assistant

## 2020-03-07 VITALS — BP 146/88 | HR 71 | Ht 68.0 in | Wt 226.0 lb

## 2020-03-07 DIAGNOSIS — G63 Polyneuropathy in diseases classified elsewhere: Secondary | ICD-10-CM

## 2020-03-07 DIAGNOSIS — E1159 Type 2 diabetes mellitus with other circulatory complications: Secondary | ICD-10-CM

## 2020-03-07 DIAGNOSIS — I152 Hypertension secondary to endocrine disorders: Secondary | ICD-10-CM

## 2020-03-07 MED ORDER — AMLODIPINE BESYLATE 10 MG PO TABS: 10.0000 mg | ORAL_TABLET | Freq: Every day | ORAL | 0 refills | Status: DC

## 2020-03-07 MED ORDER — OLMESARTAN MEDOXOMIL 40 MG PO TABS: 40.0000 mg | ORAL_TABLET | Freq: Every day | ORAL | 0 refills | Status: DC

## 2020-03-07 NOTE — Progress Notes (Signed)
Telehealth office visit note for Louis Reid, PA-C- at Primary Care at [facility]   I connected with current patient today by telephone and verified that I am speaking with the correct person   . Location of the patient: Home . Location of the provider: Office - This visit type was conducted due to national recommendations for restrictions regarding the COVID-19 Pandemic (e.g. social distancing) in an effort to limit this patient's exposure and mitigate transmission in our community.    - No physical exam could be performed with this format, beyond that communicated to [place] by the patient/ family members as noted.   - Additionally my office staff/ schedulers were to discuss with the patient that there may be a monetary charge related to this service, depending on their medical insurance.  My understanding is that patient understood and consented to proceed.     _________________________________________________________________________________   History of Present Illness: Patient calls in to follow up on hypertension.  Continues to check BP and pulse at home, average BP readings are 140s/70-80s and pulse 70-80.  Denies chest pain, palpitations, dyspnea, dizziness, or lower extremity swelling.  Reports good hydration.  States chronic back pain is about the same and needs to see neurosurgery.  Taking medications as directed without issues.     GAD 7 : Generalized Anxiety Score 09/09/2018  Nervous, Anxious, on Edge 0  Control/stop worrying 0  Worry too much - different things 0  Trouble relaxing 0  Restless 0  Easily annoyed or irritable 0  Afraid - awful might happen 0  Total GAD 7 Score 0  Anxiety Difficulty Not difficult at all    Depression screen [facility] 2/9 03/07/2020 02/17/2020 02/03/2020 01/18/2020 08/13/2019  Decreased Interest 0 0 0 2 0  Down, Depressed, Hopeless 0 0 0 1 0  PHQ - 2 Score 0 0 0 3 0  Altered sleeping _0 Tired, decreased energy _1 0  Change  in appetite 0 0 0 0 0  Feeling bad or failure about yourself  0 0 0 0 0  Trouble concentrating 0 0 0 0 0  Moving slowly or fidgety/restless 0 0 0 0 0  Suicidal thoughts 0 0 0 0 0  PHQ-9 Score _2 Difficult doing work/chores Not difficult at all Not difficult at all Somewhat difficult Not difficult at all Not difficult at all  Some recent data might be hidden      Impression and Recommendations:     1. Neuropathy due to medical condition (HCC)-C8-T1 nerve distribution bilateral upper extremity,   2. Hypertension associated with diabetes ([place])      Hypertension associated with diabetes: -BP has improved from prior but continues to be above goal and likely due to chronic back pain. -Will continue current medication regimen.  Recommend to follow-up with neurosurgery for lumbar pain. -Continue ambulatory BP and pulse monitoring. -Continue good hydration and follow a low-sodium diet. -Will continue to monitor.  Neuropathy due to medical condition: -On Gabapentin. -Recommend to follow up with Neurosurgery.  - As part of my medical decision making, I reviewed the following data within the [place] History obtained from pt /family, CMA notes reviewed and incorporated if applicable, Labs reviewed, Radiograph/ tests reviewed if applicable and OV notes from prior OV's with me, as well as any other specialists she/he has seen since seeing me last, were all reviewed and used in  my medical decision making process today.    - Additionally, when appropriate, discussion had with patient regarding our treatment plan, and their biases/concerns about that plan were used in my medical decision making today.    - The patient agreed with the plan and demonstrated an understanding of the instructions.   No barriers to understanding were identified.     - The patient was advised to call back or seek an in-person evaluation if the symptoms worsen or if the condition fails to  improve as anticipated.   Return in about 8 weeks (around 05/02/2020) for HTN.    No orders of the defined types were placed in this encounter.   Meds ordered this encounter  Medications  . amLODipine (NORVASC) 10 MG tablet    Sig: Take 1 tablet (10 mg total) by mouth daily.    Dispense:  90 tablet    Refill:  0    Order Specific Question:   Supervising Provider    Answer:   Beatrice Lecher D [[id number]]  . olmesartan (BENICAR) 40 MG tablet    Sig: Take 1 tablet (40 mg total) by mouth daily.    Dispense:  90 tablet    Refill:  0    Order Specific Question:   Supervising Provider    Answer:   Beatrice Lecher D [[id number]]    Medications Discontinued During This Encounter  Medication Reason  . olmesartan (BENICAR) 40 MG tablet Reorder  . amLODipine (NORVASC) 10 MG tablet Reorder       Time spent on visit including pre-visit chart review and post-visit care was 8 minutes.      The [place] was signed into law in 2016 which includes the topic of electronic health records.  This provides immediate access to information in MyChart.  This includes consultation notes, operative notes, office notes, lab results and pathology reports.  If you have any questions about what you read please let us know at your next visit or call us at the office.  We are right here with you.  Note:  This note was prepared with assistance of Dragon voice recognition software. Occasional wrong-word or sound-a-like substitutions may have occurred due to the inherent limitations of voice recognition software.  __________________________________________________________________________________     Patient Care Team    Relationship Specialty Notifications Start End  Louis [place]ney, [place] PCP - General   08/30/19   Janne Napoleon, PA-C  Dermatology  07/31/18    Comment: Rocco Serene, MD Consulting Physician Neurosurgery  07/31/18   Thayer Headings, MD Consulting Physician  Infectious Diseases  07/31/18   Philemon Kingdom, MD Consulting Physician Endocrinology  12/25/18      -Vitals obtained; medications/ allergies reconciled;  personal medical, social, Sx etc.histories were updated by CMA, reviewed by me and are reflected in chart   Patient Active Problem List   Diagnosis Date Noted  . Diabetes mellitus ([place]) 05/28/2019  . Morbid obesity ([place]) 05/28/2019  . Alcoholic hepatitis without ascites 05/28/2019  . Sprain of calcaneofibular ligament of ankle, initial encounter- L-  pt refused xrays of ankle 05/28/2019  . Alcohol abuse with alcohol-induced disorder ([place]) 02/07/2019  . Hepatitis 02/05/2019  . Adjustment disorder with mixed anxiety and depressed mood 02/05/2019  . Hepatocellular damage 11/07/2018  . Elevated liver function tests 11/07/2018  . Nausea 11/07/2018  . Alcohol abuse 09/09/2018  . HNP (herniated nucleus pulposus), lumbar 03/07/2018  . Transaminitis 01/29/2018  . Medication monitoring encounter 11/29/2017  .  TB lung, latent 11/06/2017  . Family history of diabetes mellitus in father 09/12/2017  . High risk medication use 09/12/2017  . Vitamin D deficiency 09/12/2017  . Hyperlipidemia associated with type 2 diabetes mellitus ([place]) 09/12/2017  . Low level of high density lipoprotein (HDL) 09/12/2017  . Hypertriglyceridemia 09/12/2017  . Psoriasis-onset age 75 has needed Cosentyx in past has failed Enbrel and Humira 08/26/2017  . Chronic radicular low back pain- R sided 08/26/2017  . History of excision of lamina of cervical vertebra for decompression of sp- C4-6inal cord 08/26/2017  . 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 RSD (reflex sympathetic dystrophy)- right entire leg painful to non-noxious stimuli 08/26/2017  . Cigarette smoker- 3 ppd for 6 yrs, then 1ppd for 9 yrs- 27 pk yr hx 08/26/2017  . Drinks beer- 6-8 beers/d on ave ( 10 Yrs or so) 08/26/2017  .  Hypertension associated with diabetes ([place]) 08/26/2017  . NSAID long-term use-4 ibuprofen 3 times daily times at least 5 years 08/26/2017  . 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 Chronic fatigue 08/26/2017  . Mood disorder ([place])- secondary to chronic pain syndrome 08/26/2017  . Insomnia 08/26/2017  . Environmental and seasonal allergies 08/26/2017  . ED (erectile dysfunction) 08/26/2017  . Allergic conjunctivitis of both eyes and rhinitis 08/26/2017  . Class 1 obesity 08/26/2017  . Plaque psoriasis 08/26/2017     Current Meds  Medication Sig  . amLODipine (NORVASC) 10 MG tablet Take 1 tablet (10 mg total) by mouth daily.  . blood glucose meter kit and supplies Dispense based on patient and insurance preference. Use to check glucose level three times daily before meals and 3 times daily 2 hours after meals (6 times daily total). (FOR ICD-10 E10.9, E11.9).  Marland Kitchen esomeprazole (NEXIUM) 20 MG capsule Take 20 mg by mouth daily at 12 noon.  . gabapentin (NEURONTIN) 300 MG capsule Take 2 capsules (600 mg total) by mouth 2 (two) times daily. (Patient taking differently: Take 600 mg by mouth daily. Patient states he is only taking 376m daily)  . ibuprofen (ADVIL) 200 MG tablet Take 1,200 mg by mouth daily.   . metoprolol succinate (TOPROL-XL) 100 MG 24 hr tablet Take 1 tablet (100 mg total) by mouth daily. Take with or immediately following a meal.  . olmesartan (BENICAR) 40 MG tablet Take 1 tablet (40 mg total) by mouth daily.  .Marland KitchenOZEMPIC, 0.25 OR 0.5 MG/DOSE, 2 MG/1.5ML SOPN INJECT 0.5MG INTO THE SKIN ONCE WEEKLY  . Risankizumab-rzaa (SKYRIZI PEN) 150 MG/ML SOAJ Inject 150 mg into the skin as directed. Every 12 weeks for maintenance.  . sildenafil (VIAGRA) 100 MG tablet TAKE 1/2 TO 1 TABLET BY MOUTH 30 MINUTES PRIOR TO INTERCOURSE AS NEEDED  . [DISCONTINUED] amLODipine (NORVASC) 10 MG tablet Take 1 tablet (10 mg total) by mouth daily.  . [DISCONTINUED] olmesartan  (BENICAR) 40 MG tablet Take 1 tablet (40 mg total) by mouth daily.     Allergies:  No Known Allergies   ROS:  See above HPI for pertinent positives and negatives   Objective:   Blood pressure (!) 146/88, pulse 71, height 5' 8" (1.727 m), weight 226 lb (102.5 kg).  (if some vitals are omitted, this means that patient was UNABLE to obtain them even though they were asked to get them prior to OV today.  They were asked to call [place] their earliest convenience with these once obtained. ) General: A &  O * 3; sounds in no acute distress Respiratory: speaking in full sentences, no conversational dyspnea Psych: insight appears good, mood- appears full

## 2020-04-07 ENCOUNTER — Other Ambulatory Visit: Payer: Self-pay | Admitting: Internal Medicine

## 2020-04-07 ENCOUNTER — Other Ambulatory Visit: Payer: Self-pay | Admitting: Physician Assistant

## 2020-04-09 ENCOUNTER — Other Ambulatory Visit: Payer: Self-pay | Admitting: Internal Medicine

## 2020-04-11 NOTE — Telephone Encounter (Signed)
Last request was refused. Last OV 4/21. Cancelled OV in [place] 2021. Please advise.

## 2020-06-19 ENCOUNTER — Other Ambulatory Visit: Payer: Self-pay | Admitting: Physician Assistant

## 2020-06-19 ENCOUNTER — Other Ambulatory Visit: Payer: Self-pay | Admitting: Internal Medicine

## 2020-06-20 ENCOUNTER — Encounter: Payer: Self-pay | Admitting: *Deleted

## 2020-06-20 ENCOUNTER — Other Ambulatory Visit: Payer: Self-pay | Admitting: Internal Medicine

## 2020-06-20 ENCOUNTER — Other Ambulatory Visit: Payer: Self-pay | Admitting: Physician Assistant

## 2020-06-24 ENCOUNTER — Other Ambulatory Visit: Payer: Self-pay | Admitting: Internal Medicine

## 2020-06-24 ENCOUNTER — Other Ambulatory Visit: Payer: Self-pay | Admitting: Physician Assistant

## 2020-06-25 ENCOUNTER — Other Ambulatory Visit: Payer: Self-pay | Admitting: Physician Assistant

## 2020-06-29 ENCOUNTER — Other Ambulatory Visit: Payer: Self-pay | Admitting: Physician Assistant

## 2020-07-03 ENCOUNTER — Telehealth: Payer: Self-pay | Admitting: Physician Assistant

## 2020-07-03 ENCOUNTER — Other Ambulatory Visit: Payer: Self-pay | Admitting: Physician Assistant

## 2020-07-03 DIAGNOSIS — E1159 Type 2 diabetes mellitus with other circulatory complications: Secondary | ICD-10-CM

## 2020-07-03 NOTE — Telephone Encounter (Signed)
Please contact pt to scheduled appointment per last AVS for further medication refills. AS, CMA

## 2020-07-04 NOTE — Telephone Encounter (Signed)
Left voicemail letting patient know he needs an appointment for further medication refills. 07-04-20

## 2020-07-06 ENCOUNTER — Encounter: Payer: Self-pay | Admitting: Physician Assistant

## 2020-07-06 ENCOUNTER — Ambulatory Visit (INDEPENDENT_AMBULATORY_CARE_PROVIDER_SITE_OTHER): Payer: Medicare Other | Admitting: Physician Assistant

## 2020-07-06 VITALS — BP 146/88 | HR 71 | Ht 68.0 in | Wt 216.0 lb

## 2020-07-06 DIAGNOSIS — E669 Obesity, unspecified: Secondary | ICD-10-CM | POA: Diagnosis not present

## 2020-07-06 DIAGNOSIS — Z6832 Body mass index (BMI) 32.0-32.9, adult: Secondary | ICD-10-CM | POA: Diagnosis not present

## 2020-07-06 DIAGNOSIS — Z716 Tobacco abuse counseling: Secondary | ICD-10-CM | POA: Diagnosis not present

## 2020-07-06 DIAGNOSIS — E1159 Type 2 diabetes mellitus with other circulatory complications: Secondary | ICD-10-CM | POA: Diagnosis not present

## 2020-07-06 DIAGNOSIS — I152 Hypertension secondary to endocrine disorders: Secondary | ICD-10-CM

## 2020-07-06 DIAGNOSIS — N529 Male erectile dysfunction, unspecified: Secondary | ICD-10-CM | POA: Diagnosis not present

## 2020-07-06 DIAGNOSIS — E1169 Type 2 diabetes mellitus with other specified complication: Secondary | ICD-10-CM | POA: Diagnosis not present

## 2020-07-06 DIAGNOSIS — Z794 Long term (current) use of insulin: Secondary | ICD-10-CM | POA: Diagnosis not present

## 2020-07-06 MED ORDER — SILDENAFIL CITRATE 100 MG PO TABS: ORAL_TABLET | ORAL | 0 refills | Status: DC

## 2020-07-06 MED ORDER — AMLODIPINE BESYLATE 10 MG PO TABS: 10.0000 mg | ORAL_TABLET | Freq: Every day | ORAL | 1 refills | Status: DC

## 2020-07-06 MED ORDER — METOPROLOL SUCCINATE ER 100 MG PO TB24: 100.0000 mg | ORAL_TABLET | Freq: Every day | ORAL | 1 refills | Status: DC

## 2020-07-06 NOTE — Patient Instructions (Signed)

## 2020-07-06 NOTE — Progress Notes (Unsigned)
Telehealth office visit note for Louis Reid, PA-C- at [facility] at [facility]   I connected with current patient today by telephone and verified that I am speaking with the correct person   . Location of the patient: Home . Location of the provider: Office - This visit type was conducted due to national recommendations for restrictions regarding the COVID-19 Pandemic (e.g. social distancing) in an effort to limit this patient's exposure and mitigate transmission in our community.    - No physical exam could be performed with this format, beyond that communicated to [place] by the patient/ family members as noted.   - Additionally my office staff/ schedulers were to discuss with the patient that there may be a monetary charge related to this service, depending on their medical insurance.  My understanding is that patient understood and consented to proceed.     _________________________________________________________________________________   History of Present Illness: Patient calls in to follow up on hypertension. Pt denies chest pain, palpitations, dizziness or lower extremity swelling. Taking medication as directed without side effects. Checks BP at home few times/wk and readings range 140s/80-92, pulse average 70s. Pt follows a low salt diet. Has increased water intake and staying hydrated, at least 64 fl oz.   Diabetes: Followed by Endocrinology. Pt denies increased urination or thirst. Pt reports medication compliance. No hypoglycemic events. Has made dietary changes by reducing sugar.   ED: Takes sildenafil once or twice/wk. Has been on medication for > 1 yr.  Tobacco abuse: Reports has reduced to 1/2 PPD and hopes to eventually be able to quit. Plans to gradually decrease use.   Weight: Reports has noticed some weight loss with reducing alcohol use, changing his diet and being more active.     GAD 7 : Generalized Anxiety Score 09/09/2018  Nervous, Anxious, on Edge 0   Control/stop worrying 0  Worry too much - different things 0  Trouble relaxing 0  Restless 0  Easily annoyed or irritable 0  Afraid - awful might happen 0  Total GAD 7 Score 0  Anxiety Difficulty Not difficult at all    Depression screen [facility] 2/9 07/06/2020 03/07/2020 02/17/2020 02/03/2020 01/18/2020  Decreased Interest 0 0 0 0 2  Down, Depressed, Hopeless 0 0 0 0 1  PHQ - 2 Score 0 0 0 0 3  Altered sleeping 0 _0 Tired, decreased energy 0 _1 Change in appetite 0 0 0 0 0  Feeling bad or failure about yourself  0 0 0 0 0  Trouble concentrating 0 0 0 0 0  Moving slowly or fidgety/restless 0 0 0 0 0  Suicidal thoughts 0 0 0 0 0  PHQ-9 Score 0 _2 Difficult doing work/chores - Not difficult at all Not difficult at all Somewhat difficult Not difficult at all  Some recent data might be hidden      Impression and Recommendations:     1. Hypertension associated with diabetes ([place])   2. Type 2 diabetes mellitus with other specified complication, with long-term current use of insulin ([place])   3. Erectile dysfunction, unspecified erectile dysfunction type   4. Encounter for tobacco use cessation counseling   5. Class 1 obesity with serious comorbidity and body mass index (BMI) of 32.0 to 32.9 in adult, unspecified obesity type     Hypertension associated with diabetes: -Suboptimal controlled. -Continue current medication regimen.  Recommend to continue with  weight loss efforts in reducing tobacco and alcohol use which will help improve hypertension.  If BP continues to be consistently >140/90 will consider medication adjustments. -Continue ambulatory BP and pulse monitoring, keep a log. -Continue good hydration and follow low-sodium diet. -Last BMP: Renal function and electrolytes normal -Advised patient to schedule lab visit for fasting blood work including CMP for medication monitoring.  Type 2 diabetes mellitus with other specified complication, with long-term current  use of insulin: -Followed by endocrinology, Dr. Cruzita Lederer  -Last A1c 6.4, stable. -Continue current medication regimen. Continue to monitor carbohydrates and glucose.  Erectile dysfunction, unspecified erectile dysfunction type: -Discussed with patient to closely monitor BP and pulse when taking sildenafil due to potential side effects. -ED related to significant spinal stenosis with neurogenic claudication. Patient underwent lumbar discetomy L5/S1 for HNP.  Class I obesity with serious comorbidity and body mass index (BMI) of 32.0 to 32.9 in adult, unspecified obesity type: -Patient has lost 10 pounds since last telehealth visit 02/2020. -Encourage to continue with dietary changes and physical activity. Patient is on Ozempic which is likely contributing to weight loss efforts.   Encounter for tobacco use cessation counseling: -Smoking cessation instruction/counseling given:  counseled patient on the dangers of tobacco use, advised patient to stop smoking, and reviewed strategies to maximize success  -Encourage to continue with reducing tobacco use.   - As part of my medical decision making, I reviewed the following data within the [place] History obtained from pt /family, CMA notes reviewed and incorporated if applicable, Labs reviewed, Radiograph/ tests reviewed if applicable and OV notes from prior OV's with me, as well as any other specialists she/he has seen since seeing me last, were all reviewed and used in my medical decision making process today.    - Additionally, when appropriate, discussion had with patient regarding our treatment plan, and their biases/concerns about that plan were used in my medical decision making today.    - The patient agreed with the plan and demonstrated an understanding of the instructions.   No barriers to understanding were identified.     - The patient was advised to call back or seek an in-person evaluation if the symptoms worsen or  if the condition fails to improve as anticipated.   Return in about 4 months (around 11/05/2020) for HTN, DM, ED (prefer in-person visit); schedule lab visit for FBW.    No orders of the defined types were placed in this encounter.   Meds ordered this encounter  Medications  . amLODipine (NORVASC) 10 MG tablet    Sig: Take 1 tablet (10 mg total) by mouth daily.    Dispense:  90 tablet    Refill:  1    Order Specific Question:   Supervising Provider    Answer:   Beatrice Lecher D [[id number]]  . metoprolol succinate (TOPROL-XL) 100 MG 24 hr tablet    Sig: Take 1 tablet (100 mg total) by mouth daily. Take with or immediately following a meal.    Dispense:  90 tablet    Refill:  1    Order Specific Question:   Supervising Provider    Answer:   Beatrice Lecher D [[id number]]  . sildenafil (VIAGRA) 100 MG tablet    Sig: Take 0.5 tablet by mouth 30 minutes prior to intercourse as needed.    Dispense:  30 tablet    Refill:  0    Order Specific Question:   Supervising Provider    Answer:  [place]    Medications Discontinued During This Encounter  Medication Reason  . metoprolol succinate (TOPROL-XL) 100 MG 24 hr tablet Reorder  . amLODipine (NORVASC) 10 MG tablet Reorder  . sildenafil (VIAGRA) 100 MG tablet Reorder       Time spent on telephone encounter was 12 minutes.      The [place] was signed into law in 2016 which includes the topic of electronic health records.  This provides immediate access to information in MyChart.  This includes consultation notes, operative notes, office notes, lab results and pathology reports.  If you have any questions about what you read please let us know at your next visit or call us at the office.  We are right here with you.  Note:  This note was prepared with assistance of Dragon voice recognition software. Occasional wrong-word or sound-a-like substitutions may have occurred due to the inherent limitations  of voice recognition software.  __________________________________________________________________________________     Patient Care Team    Relationship Specialty Notifications Start End  Louis Carney, [place] PCP - General   08/30/19   Janne Napoleon, PA-C (Inactive)  Dermatology  07/31/18    Comment: Rocco Serene, MD Consulting Physician Neurosurgery  07/31/18   Thayer Headings, MD Consulting Physician Infectious Diseases  07/31/18   Philemon Kingdom, MD Consulting Physician Endocrinology  12/25/18      -Vitals obtained; medications/ allergies reconciled;  personal medical, social, Sx etc.histories were updated by CMA, reviewed by me and are reflected in chart   Patient Active Problem List   Diagnosis Date Noted  . Diabetes mellitus ([place]) 05/28/2019  . Morbid obesity ([place]) 05/28/2019  . Alcoholic hepatitis without ascites 05/28/2019  . Sprain of calcaneofibular ligament of ankle, initial encounter- L-  pt refused xrays of ankle 05/28/2019  . Alcohol abuse with alcohol-induced disorder ([place]) 02/07/2019  . Hepatitis 02/05/2019  . Adjustment disorder with mixed anxiety and depressed mood 02/05/2019  . Hepatocellular damage 11/07/2018  . Elevated liver function tests 11/07/2018  . Nausea 11/07/2018  . Alcohol abuse 09/09/2018  . Ulnar neuropathy 06/03/2018  . HNP (herniated nucleus pulposus), lumbar 03/07/2018  . Transaminitis 01/29/2018  . Medication monitoring encounter 11/29/2017  . TB lung, latent 11/06/2017  . Spinal stenosis of lumbar region 10/01/2017  . Cervical spondylosis with myelopathy 10/01/2017  . Family history of diabetes mellitus in father 09/12/2017  . High risk medication use 09/12/2017  . Vitamin D deficiency 09/12/2017  . Hyperlipidemia associated with type 2 diabetes mellitus ([place]) 09/12/2017  . Low level of high density lipoprotein (HDL) 09/12/2017  . Hypertriglyceridemia 09/12/2017  . Psoriasis-onset age 29 has needed Cosentyx in past  has failed Enbrel and Humira 08/26/2017  . Chronic radicular low back pain- R sided 08/26/2017  . History of excision of lamina of cervical vertebra for decompression of sp- C4-6inal cord 08/26/2017  . 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 RSD (reflex sympathetic dystrophy)- right entire leg painful to non-noxious stimuli 08/26/2017  . Cigarette smoker- 3 ppd for 6 yrs, then 1ppd for 9 yrs- 27 pk yr hx 08/26/2017  . Drinks beer- 6-8 beers/d on ave ( 10 Yrs or so) 08/26/2017  . Hypertension associated with diabetes ([place]) 08/26/2017  . NSAID long-term use-4 ibuprofen 3 times daily times at least 5 years 08/26/2017  . 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 Chronic fatigue 08/26/2017  . Mood disorder (  [place])- secondary to chronic pain syndrome 08/26/2017  . Insomnia 08/26/2017  . Environmental and seasonal allergies 08/26/2017  . ED (erectile dysfunction) 08/26/2017  . Allergic conjunctivitis of both eyes and rhinitis 08/26/2017  . Class 1 obesity 08/26/2017  . Plaque psoriasis 08/26/2017     Current Meds  Medication Sig  . blood glucose meter kit and supplies Dispense based on patient and insurance preference. Use to check glucose level three times daily before meals and 3 times daily 2 hours after meals (6 times daily total). (FOR ICD-10 E10.9, E11.9).  Marland Kitchen esomeprazole (NEXIUM) 20 MG capsule Take 20 mg by mouth daily at 12 noon.  . gabapentin (NEURONTIN) 300 MG capsule Take 2 capsules (600 mg total) by mouth 2 (two) times daily. (Patient taking differently: Take 600 mg by mouth daily. Patient states he is only taking 317m daily)  . ibuprofen (ADVIL) 200 MG tablet Take 1,200 mg by mouth daily.   . insulin aspart (NOVOLOG FLEXPEN) 100 UNIT/ML FlexPen Inject 10-14 Units into the skin 3 (three) times daily with meals.  . insulin glargine (LANTUS SOLOSTAR) 100 UNIT/ML Solostar Pen Inject 8 units  into the skin twice daily-12 hours apart.  .Marland Kitchenolmesartan (BENICAR) 40 MG tablet Take 1 tablet (40 mg total) by mouth daily. **NEEDS APT FOR REFILLS**  . OneTouch Delica Lancets 373ZMISC USE TO TEST BLOOD SUGARS 6 TIMES DAILY (BEFORE MEALS AND 2 HOURS AFTER MEALS)  . ONETOUCH VERIO test strip USE TO TEST 6 TIMES DAILY (BEFORE MEALS AND 2 HOURS AFTER MEALS)  . OZEMPIC, 0.25 OR 0.5 MG/DOSE, 2 MG/1.5ML SOPN INJECT 0.5MG INTO THE SKIN ONCE WEEKLY  . Risankizumab-rzaa (SKYRIZI PEN) 150 MG/ML SOAJ Inject 150 mg into the skin as directed. Every 12 weeks for maintenance.  . [DISCONTINUED] amLODipine (NORVASC) 10 MG tablet Take 1 tablet (10 mg total) by mouth daily.  . [DISCONTINUED] metoprolol succinate (TOPROL-XL) 100 MG 24 hr tablet Take 1 tablet (100 mg total) by mouth daily. Take with or immediately following a meal.  . [DISCONTINUED] sildenafil (VIAGRA) 100 MG tablet TAKE 1/2 TO 1 TABLET BY MOUTH 30 MINUTES PRIOR TO INTERCOURSE AS NEEDED     Allergies:  No Known Allergies   ROS:  See above HPI for pertinent positives and negatives   Objective:   Blood pressure (!) 146/88, pulse 71, height 5' 8" (1.727 m), weight 216 lb (98 kg).  (if some vitals are omitted, this means that patient was UNABLE to obtain them. ) General: A & O * 3; sounds in no acute distress; in usual state of health.  Respiratory: speaking in full sentences, no conversational dyspnea Psych: insight appears good, mood- appears full

## 2020-07-11 NOTE — Telephone Encounter (Signed)
Faxed the office notes from last visit to [facility] [phone]

## 2020-07-13 ENCOUNTER — Other Ambulatory Visit: Payer: Self-pay | Admitting: Internal Medicine

## 2020-07-22 ENCOUNTER — Encounter: Payer: Self-pay | Admitting: Internal Medicine

## 2020-07-22 ENCOUNTER — Ambulatory Visit (INDEPENDENT_AMBULATORY_CARE_PROVIDER_SITE_OTHER): Payer: Medicare Other | Admitting: Internal Medicine

## 2020-07-22 ENCOUNTER — Other Ambulatory Visit: Payer: Self-pay

## 2020-07-22 VITALS — BP 140/88 | HR 84 | Ht 68.0 in | Wt 221.2 lb

## 2020-07-22 DIAGNOSIS — E1065 Type 1 diabetes mellitus with hyperglycemia: Secondary | ICD-10-CM | POA: Diagnosis not present

## 2020-07-22 DIAGNOSIS — Z794 Long term (current) use of insulin: Secondary | ICD-10-CM | POA: Diagnosis not present

## 2020-07-22 DIAGNOSIS — E1169 Type 2 diabetes mellitus with other specified complication: Secondary | ICD-10-CM

## 2020-07-22 DIAGNOSIS — E1165 Type 2 diabetes mellitus with hyperglycemia: Secondary | ICD-10-CM | POA: Diagnosis not present

## 2020-07-22 DIAGNOSIS — E669 Obesity, unspecified: Secondary | ICD-10-CM | POA: Diagnosis not present

## 2020-07-22 DIAGNOSIS — IMO0002 Reserved for concepts with insufficient information to code with codable children: Secondary | ICD-10-CM

## 2020-07-22 DIAGNOSIS — E781 Pure hyperglyceridemia: Secondary | ICD-10-CM

## 2020-07-22 LAB — POCT GLYCOSYLATED HEMOGLOBIN (HGB A1C): Hemoglobin A1C: 8.3 % — AB (ref 4.0–5.6)

## 2020-07-22 MED ORDER — NOVOLOG FLEXPEN 100 UNIT/ML ~~LOC~~ SOPN: 8.0000 [IU] | PEN_INJECTOR | Freq: Two times a day (BID) | SUBCUTANEOUS | 11 refills | Status: DC

## 2020-07-22 MED ORDER — OZEMPIC (1 MG/DOSE) 4 MG/3ML ~~LOC~~ SOPN: 1.0000 mg | PEN_INJECTOR | SUBCUTANEOUS | 3 refills | Status: DC

## 2020-07-22 MED ORDER — LANTUS SOLOSTAR 100 UNIT/ML ~~LOC~~ SOPN: PEN_INJECTOR | SUBCUTANEOUS | 3 refills | Status: DC

## 2020-07-22 NOTE — Progress Notes (Addendum)
Patient ID: Louis Carney, adult   DOB: June 14, 1980, 40 y.o.   MRN: [id number]   This visit occurred during the SARS-CoV-2 public health emergency.  Safety protocols were in place, including screening questions prior to the visit, additional usage of staff PPE, and extensive cleaning of exam room while observing appropriate contact time as indicated for disinfecting solutions.   HPI: Louis Carney is a 40 y.o.-year-old adult, initially referred by his PCP, Dr. Raliegh Scarlet, returning for follow-up for DM2, dx in 09/2018, insulin-dependent, uncontrolled, without long-term complications.  Last visit 11 months ago.  He no showed his appointment from 11/2019.  Interim history: He was off Ozempic for 3 weeks >> just restarted. He also reduced his insulin since last visit, using both Lantus and NovoLog only once a day.  He recently increased the dose back to twice a day but he is using a lower dose of Lantus and a higher dose of NovoLog.  Upon questioning, he is injecting in the same place on the abdomen. He denies vision changes, nausea, increased urination.  Reviewed history: He had dizziness, blurry vision >> checked sugar with neighbor's meter >> CBG high (HI, then rechecked: 540).  He saw PCP >> HbA1c was high x2 >> added insulin.  He felt much better after adding insulin.  However, sugars were still extremely high at last visit so we added rapid acting insulin and I made specific suggestions about his diet and alcohol intake.  Reviewed HbA1c levels: Lab Results  Component Value Date   HGBA1C 6.4 (A) 01/18/2020   HGBA1C 6.2 (A) 08/21/2019   HGBA1C 6.0 (A) 01/20/2019   HGBA1C 9.9 (A) 10/27/2018   HGBA1C 10.2 (H) 10/27/2018   HGBA1C 5.6 03/06/2018   HGBA1C 5.3 08/26/2017   Previously on: - Lantus 25 units 2x a day >> 24 units 2x a day - Novolog 12-14 units 15 min before lunch and dinner - added 10/2018  At last visit, I advised him to change to: - Ozempic 0.5 mg weekly >> no nausea - Lantus 25  units 2x a day >> 18 units 2x a day - Novolog 8-14 units 15 min before lunch and dinner >> 14 >> 8-10 >> 15 units before a larger meal    Pt checks his sugars once a day: - am: 202-300 >> 105-132 >> 107-140 >> 97-126 >> 126-164 - 2h after b'fast: n/c - before lunch: 300-340 >> <130 >> 140s >> 97-126 >> n/c - 2h after lunch: n/c - before dinner: 340 >> 128-132 >> n/c - 2h after dinner: n/c - bedtime: 340 >> 130s >> up to 189 >> 106-126 >> 124-168 - nighttime: n/c Lowest sugar was 202 >> 105 >> 107 >> 97 >> 124; it is unclear at which CBG level he has hypoglycemia awareness. Highest sugar was 460 >> 141 >> 189 >> 126 >> 202.  Glucometer:ReliOn  Pt's meals are: - Breakfast: skips - Lunch: [place] sandwich  - Dinner: lasagna, hamburger, steak + potatoes, pasta - Snacks:cookies, chips. He does not like greens. In the past, he was drinking 4-6 shots of vodka, 6 pack beer EVERY night >> then 5 Bud Light beers a night >> then liquor/vodka..  -No CKD.  Last BUN/creatinine:  Lab Results  Component Value Date   BUN 13 12/25/2019   BUN 8 11/06/2018   CREATININE 0.61 12/25/2019   CREATININE 0.79 11/06/2018  On olmesartan.  -He had significant hypertriglyceridemia, which improved at last check; last set of lipids: Lab Results  Component Value Date  CHOL 267 (H) 01/20/2019   HDL 32.90 (L) 01/20/2019   LDLCALC Comment 10/27/2018   LDLDIRECT 184.0 01/20/2019   TRIG 386.0 (H) 01/20/2019   CHOLHDL 8 01/20/2019    He has a history of transaminitis: Lab Results  Component Value Date   ALT 102 (H) 01/20/2019   AST 119 (H) 01/20/2019   ALKPHOS 50 01/20/2019   BILITOT 0.7 01/20/2019  He sees Dr. Carlean Purl with [facility].    - last eye exam was in 2021: No DR reportedly.  -+ numbness and tingling in his R leg - from back pain - see Dr. Cyndy Freeze.  Pt has FH of DM in father - type 2 DM. + significant alcoholism FH.  He is on Risankizumab in 04/2018 for psoriasis >> working. Prev. Embrel,  Humira, etc. his psoriasis is much improved on meds.  No history of pancreatitis or personal or family history of medullary thyroid cancer.  ROS: Constitutional: no weight gain/no weight loss, no fatigue, no subjective hyperthermia, no subjective hypothermia Eyes: no blurry vision, no xerophthalmia ENT: no sore throat, no nodules palpated in neck, no dysphagia, no odynophagia, no hoarseness Cardiovascular: no CP/no SOB/no palpitations/no leg swelling Respiratory: no cough/no SOB/no wheezing Gastrointestinal: no N/no V/no D/no C/no acid reflux Musculoskeletal: no muscle aches/+ joint aches - back pain Skin: + Rash on hands (psoriasis) >> much improved., no hair loss Neurological: no tremors/no numbness/no tingling/no dizziness  I reviewed pt's medications, allergies, PMH, social hx, family hx, and changes were documented in the history of present illness. Otherwise, unchanged from my initial visit note.  Past Medical History:  Diagnosis Date  . Arthritis   . Diabetes ([place])    type 1  . GERD (gastroesophageal reflux disease)   . Headache    migraines  . Hypertension   . Psoriasis    since age 21 years  . Tuberculosis    latent TB infection on treatment   Past Surgical History:  Procedure Laterality Date  . CARPAL TUNNEL RELEASE Right 01/15/2018   Procedure: Right Carpal tunnel release;  Surgeon: Ashok Pall, MD;  Location: [facility];  Service: Neurosurgery;  Laterality: Right;  Right Carpal tunnel release  . CERVICAL SPINE SURGERY    . LUMBAR LAMINECTOMY/DECOMPRESSION MICRODISCECTOMY Right 03/07/2018   Procedure: Right Lumbar 5 Sacral 1 Microdiscectomy;  Surgeon: Ashok Pall, MD;  Location: [facility];  Service: Neurosurgery;  Laterality: Right;  Right Lumbar 5 Sacral 1 Microdiscectomy  . NECK SURGERY     Social History   Socioeconomic History  . Marital status: Married    Spouse name: Not on file  . Number of children: 5  . Years of education: Not on file  . Highest education  level: Not on file  Occupational History  . Occupation: disabled  Tobacco Use  . Smoking status: Current Every Day Smoker    Packs/day: 1.00    Years: 15.00    Pack years: 15.00    Types: Cigarettes  . Smokeless tobacco: Never Used  Vaping Use  . Vaping Use: Never used  Substance and Sexual Activity  . Alcohol use: Yes    Alcohol/week: 24.0 standard drinks    Types: 24 Standard drinks or equivalent per week  . Drug use: Never  . Sexual activity: Yes    Partners: Female    Birth control/protection: None  Other Topics Concern  . 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 amLODipine (NORVASC) 10 MG tablet Take 1 tablet (10 mg total) by mouth daily. 90 tablet 1  . blood glucose meter kit and supplies Dispense based on patient and insurance preference. Use to check glucose level three times daily before meals and 3 times daily 2 hours after meals (6 times daily total). (FOR ICD-10 E10.9, E11.9). 1 each 0  . esomeprazole (NEXIUM) 20 MG capsule Take 20 mg by mouth daily at 12 noon.    . gabapentin (NEURONTIN) 300 MG capsule Take 2 capsules (600 mg total) by mouth 2 (two) times daily. (Patient taking differently: Take 600 mg by mouth daily. Patient states he is only taking 311m daily) 360 capsule 0  . ibuprofen (ADVIL) 200 MG tablet Take 1,200 mg by mouth daily.     . insulin aspart (NOVOLOG FLEXPEN) 100 UNIT/ML FlexPen  Inject 10-14 Units into the skin 3 (three) times daily with meals. 15 mL 11  . insulin glargine (LANTUS SOLOSTAR) 100 UNIT/ML Solostar Pen Inject 8 units into the skin twice daily-12 hours apart. 5 pen PRN  . metoprolol succinate (TOPROL-XL) 100 MG 24 hr tablet Take 1 tablet (100 mg total) by mouth daily. Take with or immediately following a meal. 90 tablet 1  . olmesartan (BENICAR) 40 MG tablet Take 1 tablet (40 mg total) by mouth daily. **NEEDS APT FOR REFILLS** 60 tablet 0  . OneTouch Delica Lancets 327NMISC USE TO TEST BLOOD SUGARS 6 TIMES DAILY (BEFORE MEALS AND 2 HOURS AFTER MEALS) 200 each 0  . ONETOUCH VERIO test strip USE TO TEST 6 TIMES DAILY (BEFORE MEALS AND 2 HOURS AFTER MEALS) 200 each 0  . OZEMPIC, 0.25 OR 0.5 MG/DOSE, 2 MG/1.5ML SOPN INJECT 0.5MG INTO THE SKIN ONCE A WEEK 2 mL 0  . Risankizumab-rzaa (SKYRIZI PEN) 150 MG/ML SOAJ Inject 150 mg into the skin as directed. Every 12 weeks for maintenance. 1 mL 3  . sildenafil (VIAGRA) 100 MG tablet Take 0.5 tablet by mouth 30 minutes prior to intercourse as needed. 30 tablet 0   No current facility-administered medications on file prior to visit.   No Known Allergies Family History  Problem Relation Age of Onset  . Alcohol abuse Mother   . Hypertension Mother   . Lung cancer Mother   . Alcohol abuse Father   . Hypertension Father   . Colon cancer Father        dx in his late 525's . Esophageal cancer Neg Hx   . Rectal cancer Neg Hx     PE: BP 140/88 (BP Location: Right Arm, Patient Position: Sitting, Cuff Size: Large)   Pulse 84   Ht _0  (1.727 m)   Wt 221 lb 3.2 oz (100.3 kg)   SpO2 96%   BMI 33.63 kg/m  Wt Readings from Last 3 Encounters:  07/22/20 221 lb 3.2 oz (100.3 kg)  07/06/20 216 lb (98 kg)  03/07/20 226 lb (102.5 kg)   Constitutional: overweight,  in NAD Eyes: PERRLA, EOMI, no exophthalmos ENT: moist mucous membranes, no thyromegaly, no cervical lymphadenopathy Cardiovascular: RRR, No MRG Respiratory:  CTA B Gastrointestinal: abdomen soft, NT, ND, BS+ Musculoskeletal: no deformities, strength intact in all 4 Skin: moist, warm, no rashes Neurological: no tremor with outstretched hands, DTR normal in all 4  ASSESSMENT: 1. DM, insulin-dependent, without long-term complications  2. HTG  3.  Obesity class I  PLAN:  1. Patient with insulin-dependent diabetes with improved sugars after he started to improve his diet.  At last visit, HbA1c was 6.2%, excellent.  Since then, he had another HbA1c in 12/2019, which was slightly higher, but still at goal, at 6.4%. -Of note, we did rule out type 1 diabetes in the past. -At last visit, sugars were excellent throughout the day per his report.  He was not taking NovoLog consistently, only approximately once a day so we discussed about only taking this before a larger meal.  We did not change the Lantus and Ozempic dose at that time.  He tolerates Ozempic well. - At today's visit, he tells me that was without Ozempic for 3 weeks and he just restarted.  Also, he was only taking the insulins once a day, rather than twice a day as prescribed.  He recently started to take it twice a day.  This is taking 18 units of Lantus, rather than 25 units twice a day as recommended at last visit.  At this visit, since sugars are above target, will increase Lantus to 22 units twice a day.  I advised him to take NovoLog only with a larger meal and only take 8 to 10 units, rather than 15 units, as he is taking now.  Also, since he tolerates Ozempic well, we will go ahead and increase the dose to 1 mg weekly. -At this visit, upon questioning, he is only injecting insulin in a small region close to the umbilicus.  I strongly advised him to rotate the sites, since I suspect that he has scar tissue built in this area -He could not afford a CGM in the past - I suggested to:  Patient Instructions  Please increase: - Ozempic 1 mg weekly  Please increase: - Lantus 22 units 2x a  day  Please decrease: - Novolog 8-10 units 15 min before a large meal  Please return in 3 months with your sugar log.    - we checked his HbA1c: 8.3% (higher) - advised to check sugars at different times of the day - 3x a day, rotating check times - advised for yearly eye exams >> he is UTD - return to clinic in 3 months  2. HTG -Reviewed latest lipid panel from 12/2018: LDL very high, triglycerides very high, HDL low: Lab Results  Component Value Date   CHOL 267 (H) 01/20/2019   HDL 32.90 (L) 01/20/2019   LDLCALC Comment 10/27/2018   LDLDIRECT 184.0 01/20/2019   TRIG 386.0 (H) 01/20/2019   CHOLHDL 8 01/20/2019  -We discussed in the past about stopping alcohol -I recommended a statin at last visit but he did not start -he is due for another lipid panel >> we will check this at next visit  3.  Obesity class I -We discussed at last visit about cutting out alcohol, starches, fatty foods and snacks.  He was also constantly eating between meals and I advised him to try to eliminate snacks but to snack on fruit if absolutely needed. -We will continue Ozempic which will also help with  weight loss -we will also increase the dose to - he gained 7 lbs before last visit, but lost a net 11 lbs since then  Philemon Kingdom, MD PhD [facility] Endocrinology

## 2020-07-22 NOTE — Patient Instructions (Addendum)
Please increase: - Ozempic 1 mg weekly  Please increase: - Lantus 22 units 2x a day  Please decrease: - Novolog 8-10 units 15 min before a large meal  Please return in 3 months with your sugar log.

## 2020-08-25 ENCOUNTER — Other Ambulatory Visit: Payer: Self-pay | Admitting: Physician Assistant

## 2020-08-25 DIAGNOSIS — N529 Male erectile dysfunction, unspecified: Secondary | ICD-10-CM

## 2020-08-30 IMAGING — US US EXTREM LOW VENOUS*L*
1 series · 14 of 24 positions shown · non-contrast
Comparison: None.

CLINICAL DATA: Left lower extremity edema, pain, and color changes

EXAM:
Left LOWER EXTREMITY VENOUS DOPPLER ULTRASOUND
TECHNIQUE: Gray-scale sonography with compression, as well as color and duplex
ultrasound, were performed to evaluate the deep venous system(s)
from the level of the common femoral vein through the popliteal and
proximal calf veins.

[Series 1: us venous img lower uni left (dvt) · portal-venous · 14 of 34 slices shown]
[im 1/34]
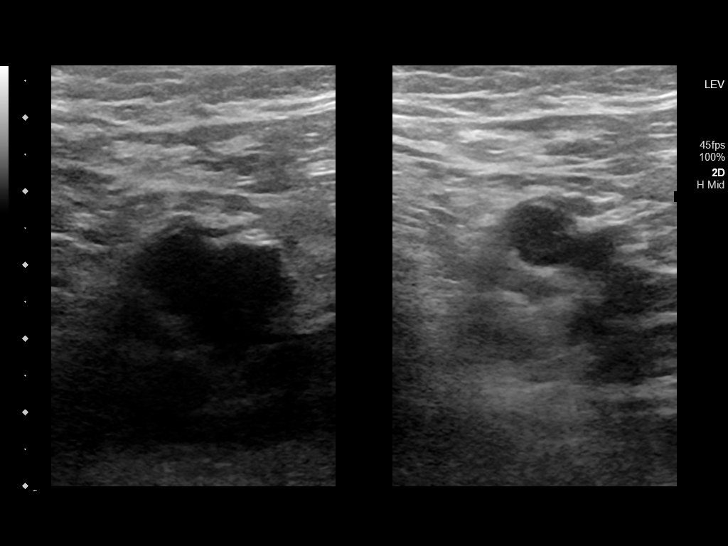
[im 3/34]
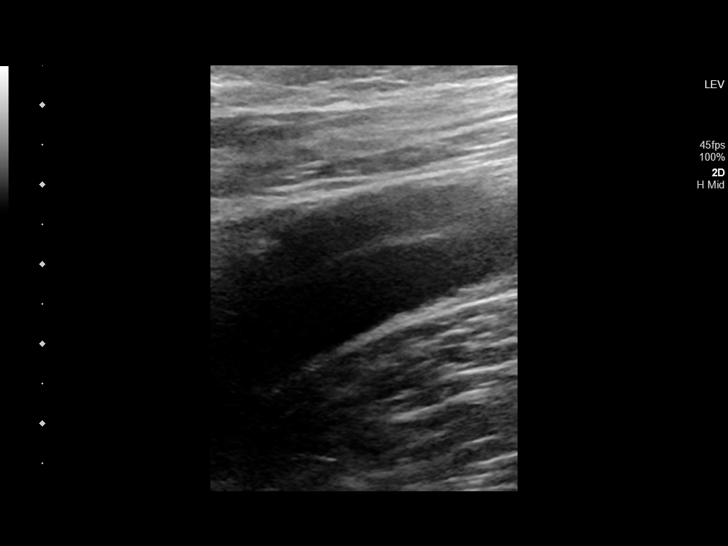
[im 6/34]
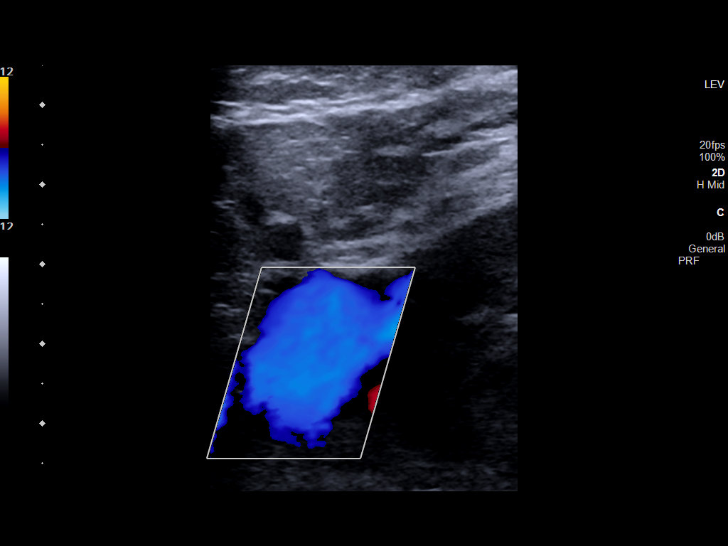
[im 9/34]
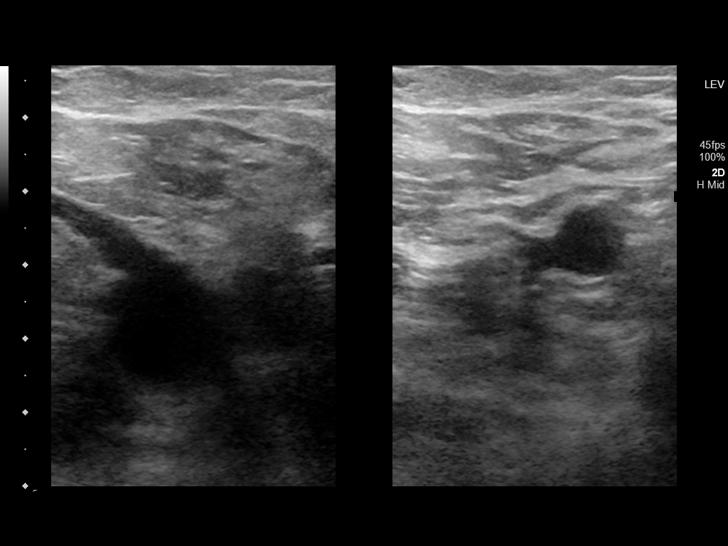
[im 11/34]
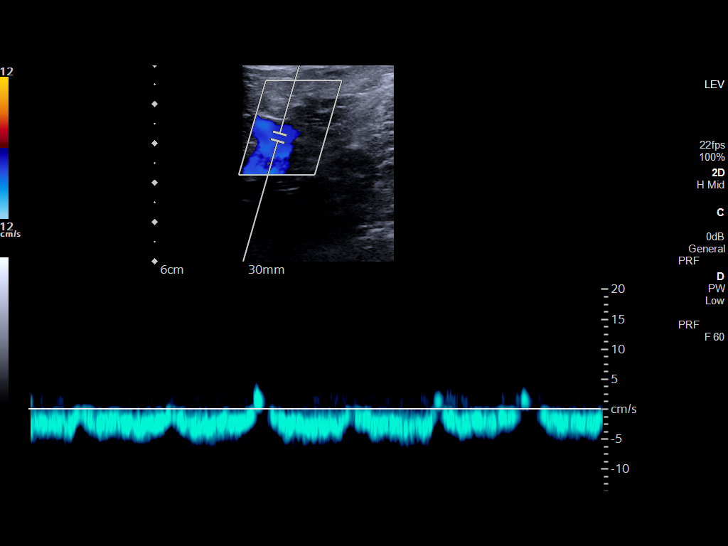
[im 13/34]
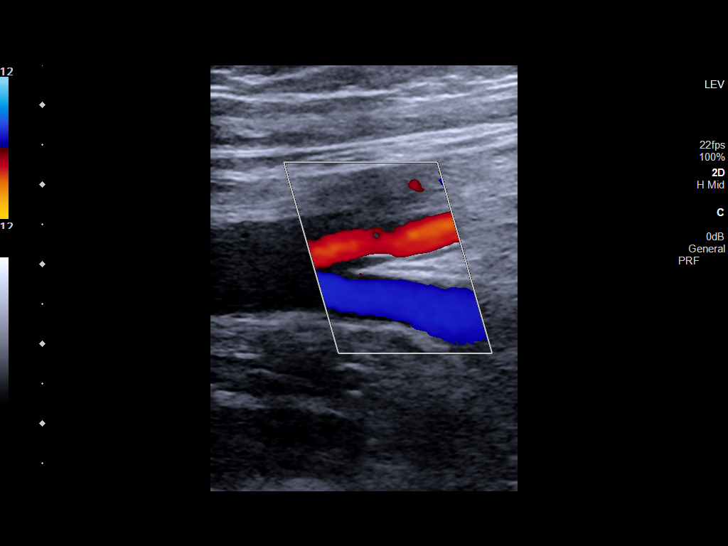
[im 16/34]
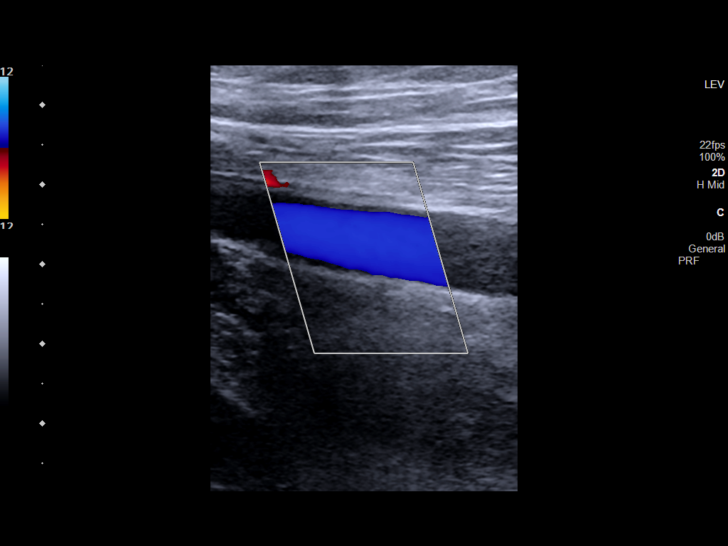
[im 18/34]
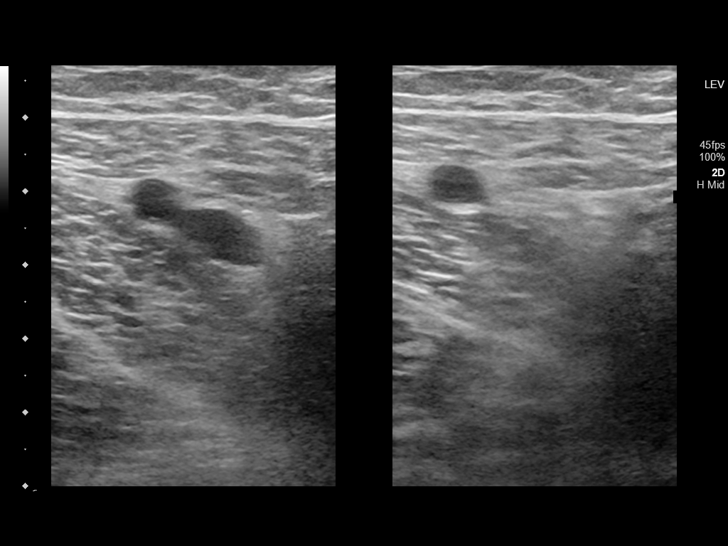
[im 21/34]
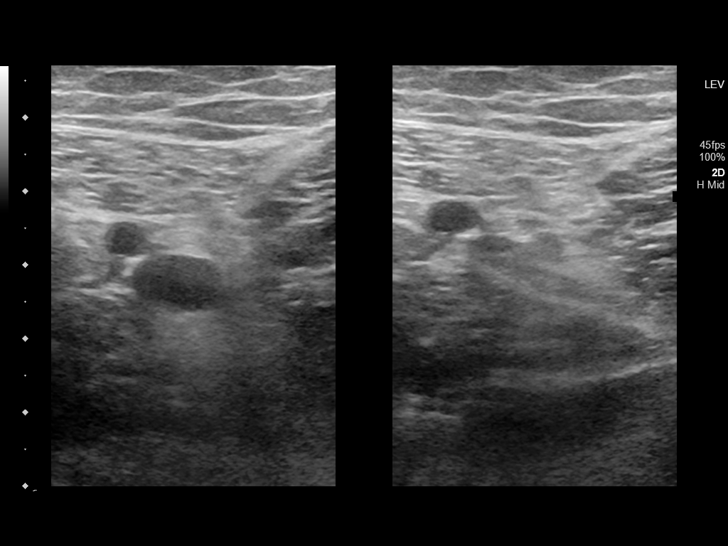
[im 23/34]
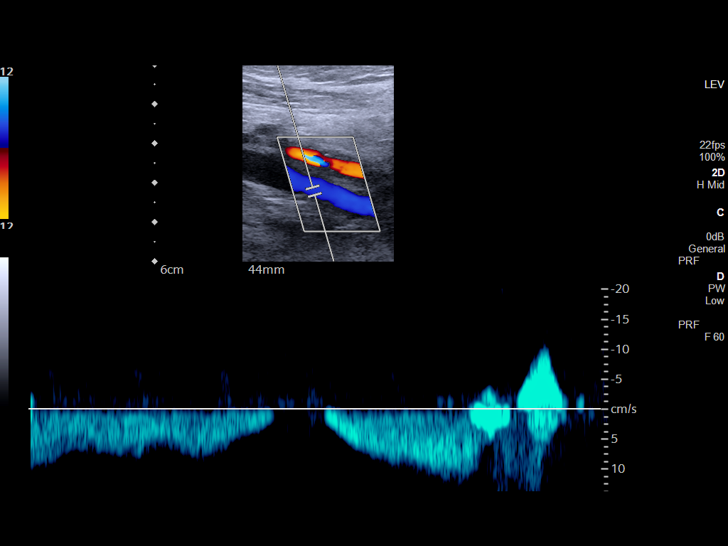
[im 26/34]
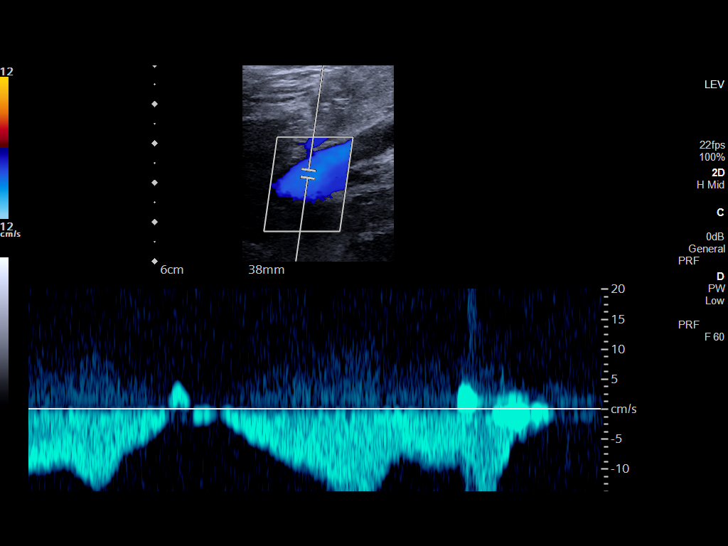
[im 28/34]
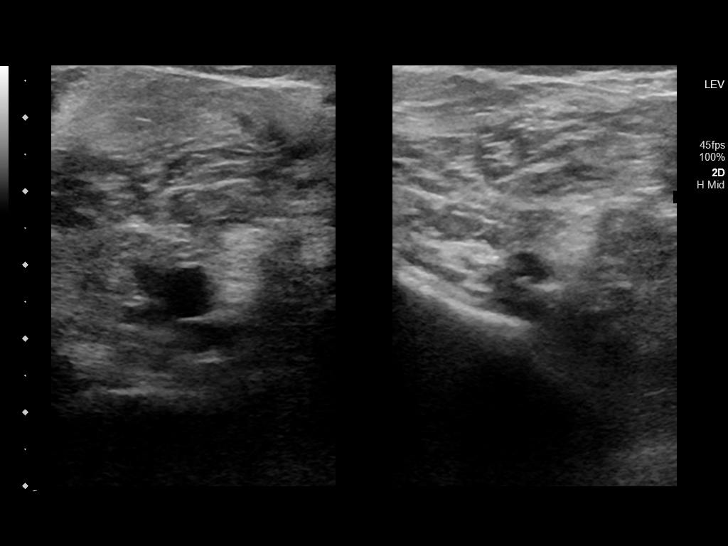
[im 31/34]
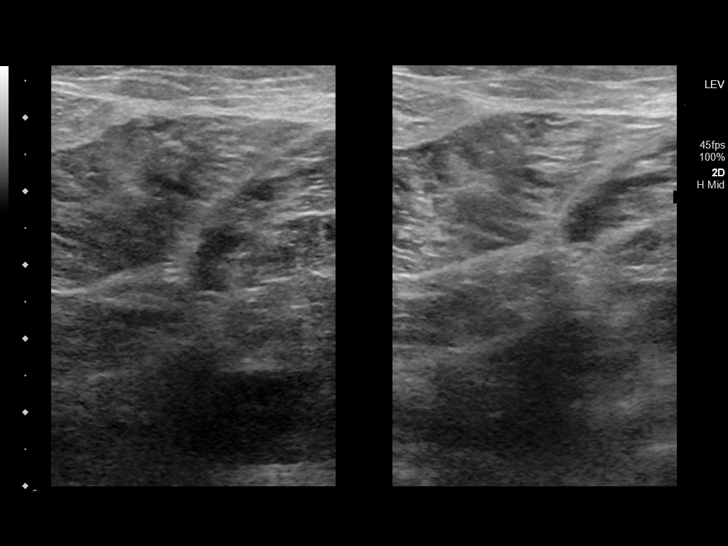
[im 34/34]
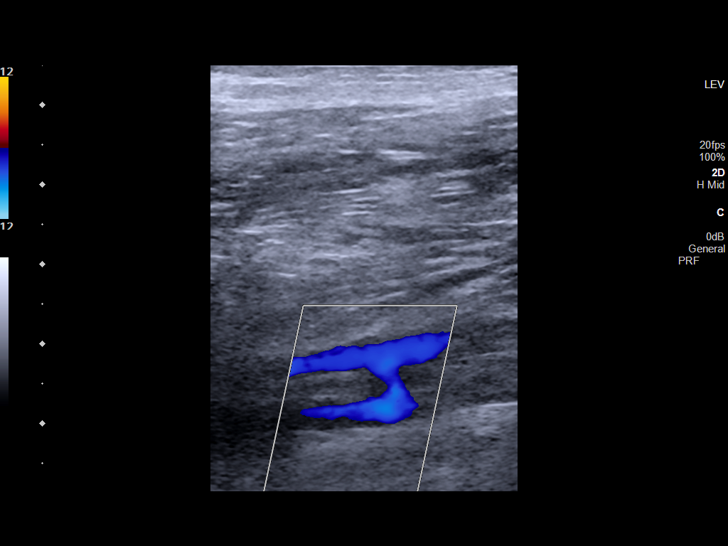

[14 of 24 positions shown; findings below may reference images not displayed]

FINDINGS: VENOUS

Normal compressibility of the common femoral, superficial femoral,
and popliteal veins, as well as the visualized calf veins.
Visualized portions of profunda femoral vein and great saphenous
vein unremarkable. No filling defects to suggest DVT on grayscale or
color Doppler imaging. Doppler waveforms show normal direction of
venous flow, normal respiratory plasticity and response to
augmentation.

Limited views of the contralateral common femoral vein are
unremarkable.

OTHER

None.

Limitations: none
IMPRESSION: 1. No sonographic findings of left lower extremity DVT.

## 2020-09-01 ENCOUNTER — Other Ambulatory Visit: Payer: Self-pay | Admitting: Physician Assistant

## 2020-09-01 DIAGNOSIS — N529 Male erectile dysfunction, unspecified: Secondary | ICD-10-CM

## 2020-09-05 ENCOUNTER — Other Ambulatory Visit: Payer: Self-pay | Admitting: Physician Assistant

## 2020-09-05 DIAGNOSIS — I152 Hypertension secondary to endocrine disorders: Secondary | ICD-10-CM

## 2020-09-05 DIAGNOSIS — N529 Male erectile dysfunction, unspecified: Secondary | ICD-10-CM

## 2020-09-07 ENCOUNTER — Other Ambulatory Visit: Payer: Self-pay | Admitting: Physician Assistant

## 2020-09-07 ENCOUNTER — Other Ambulatory Visit: Payer: Self-pay | Admitting: Family Medicine

## 2020-09-07 DIAGNOSIS — N529 Male erectile dysfunction, unspecified: Secondary | ICD-10-CM

## 2020-09-09 ENCOUNTER — Other Ambulatory Visit: Payer: Self-pay | Admitting: Physician Assistant

## 2020-09-09 ENCOUNTER — Other Ambulatory Visit: Payer: Self-pay | Admitting: Family Medicine

## 2020-09-09 DIAGNOSIS — N529 Male erectile dysfunction, unspecified: Secondary | ICD-10-CM

## 2020-09-22 ENCOUNTER — Other Ambulatory Visit: Payer: Self-pay | Admitting: Physician Assistant

## 2020-09-22 DIAGNOSIS — N529 Male erectile dysfunction, unspecified: Secondary | ICD-10-CM

## 2020-09-22 NOTE — Telephone Encounter (Signed)
Please reach out to pt for an appt. Per last AVS

## 2020-09-22 NOTE — Telephone Encounter (Signed)
Thanks

## 2020-09-22 NOTE — Telephone Encounter (Signed)
Patient scheduled.

## 2020-10-21 ENCOUNTER — Other Ambulatory Visit: Payer: Self-pay | Admitting: Physician Assistant

## 2020-10-21 DIAGNOSIS — N529 Male erectile dysfunction, unspecified: Secondary | ICD-10-CM

## 2020-11-03 ENCOUNTER — Ambulatory Visit: Payer: Medicare Other | Admitting: Internal Medicine

## 2020-11-08 ENCOUNTER — Ambulatory Visit: Payer: Medicare Other | Admitting: Physician Assistant

## 2020-11-15 ENCOUNTER — Other Ambulatory Visit: Payer: Self-pay | Admitting: Physician Assistant

## 2020-11-15 DIAGNOSIS — N529 Male erectile dysfunction, unspecified: Secondary | ICD-10-CM

## 2020-11-15 NOTE — Telephone Encounter (Signed)
Ben,   Please reach out to the patient to schedule a follow up per last AVS.   Respectfully,  Gwyndolyn Saxon

## 2020-12-08 ENCOUNTER — Telehealth: Payer: Self-pay | Admitting: *Deleted

## 2020-12-08 NOTE — Telephone Encounter (Signed)
Refill request denied for [facility]- patient due office visit.

## 2020-12-21 ENCOUNTER — Telehealth: Payer: Self-pay | Admitting: Physician Assistant

## 2020-12-21 ENCOUNTER — Other Ambulatory Visit: Payer: Self-pay | Admitting: Physician Assistant

## 2020-12-21 DIAGNOSIS — E1159 Type 2 diabetes mellitus with other circulatory complications: Secondary | ICD-10-CM

## 2020-12-21 DIAGNOSIS — I152 Hypertension secondary to endocrine disorders: Secondary | ICD-10-CM

## 2020-12-21 NOTE — Telephone Encounter (Signed)
Please contact patient to schedule an appointment per last AVS for further medication refills. AS, CMA

## 2021-01-07 ENCOUNTER — Telehealth: Payer: Self-pay

## 2021-01-07 NOTE — Telephone Encounter (Signed)
LVM for pt to call the office to schedule AWV with myself or in person. My available dates are Sat , September 10, 17, 24  form 8-12 and Sunday September  25 from 8-12.   Elyse Jarvis, RMA

## 2021-01-16 ENCOUNTER — Ambulatory Visit: Payer: Medicare Other | Admitting: Internal Medicine

## 2021-01-16 ENCOUNTER — Telehealth: Payer: Self-pay | Admitting: *Deleted

## 2021-01-16 NOTE — Telephone Encounter (Signed)
Received refill for [employer]- Denied- Patient needs office visit.

## 2021-01-17 ENCOUNTER — Telehealth: Payer: Self-pay | Admitting: Physician Assistant

## 2021-01-17 NOTE — Telephone Encounter (Signed)
Unable to reach pt to schedule AWV. Voicemail full

## 2021-01-18 ENCOUNTER — Telehealth: Payer: Self-pay | Admitting: *Deleted

## 2021-01-18 NOTE — Telephone Encounter (Signed)
[facility] denied patient needs OV.

## 2021-01-24 ENCOUNTER — Ambulatory Visit (INDEPENDENT_AMBULATORY_CARE_PROVIDER_SITE_OTHER): Payer: Medicare Other

## 2021-01-24 DIAGNOSIS — Z0000 Encounter for general adult medical examination without abnormal findings: Secondary | ICD-10-CM | POA: Diagnosis not present

## 2021-01-24 NOTE — Patient Instructions (Signed)
Colonoscopy, Adult A colonoscopy is a procedure to look at the entire large intestine. This procedure is done using a long, thin, flexible tube that has a camera on the end. You may have a colonoscopy: As a part of normal colorectal screening. If you have certain symptoms, such as: A low number of red blood cells in your blood (anemia). Diarrhea that does not go away. Pain in your abdomen. Blood in your stool. A colonoscopy can help screen for and diagnose medical problems, including: Tumors. Extra tissue that grows where mucus forms (polyps). Inflammation. Areas of bleeding. Tell your health care provider about: Any allergies you have. All medicines you are taking, including vitamins, herbs, eye drops, creams, and over-the-counter medicines. Any problems you or family members have had with anesthetic medicines. Any blood disorders you have. Any surgeries you have had. Any medical conditions you have. Any problems you have had with having bowel movements. Whether you are pregnant or may be pregnant. What are the risks? Generally, this is a safe procedure. However, problems may occur, including: Bleeding. Damage to your intestine. Allergic reactions to medicines given during the procedure. Infection. This is rare. What happens before the procedure? Eating and drinking restrictions Follow instructions from your health care provider about eating or drinking restrictions, which may include: A few days before the procedure: Follow a low-fiber diet. Avoid nuts, seeds, dried fruit, raw fruits, and vegetables. 1-3 days before the procedure: Eat only gelatin dessert or ice pops. Drink only clear liquids, such as water, clear juice, clear broth or bouillon, black coffee or tea, or clear soft drinks or sports drinks. Avoid liquids that contain red or purple dye. The day of the procedure: Do not eat solid foods. You may continue to drink clear liquids until up to 2 hours before the  procedure. Do not eat or drink anything starting 2 hours before the procedure, or within the time period that your health care provider recommends. Bowel prep If you were prescribed a bowel prep to take by mouth (orally) to clean out your colon: Take it as told by your health care provider. Starting the day before your procedure, you will need to drink a large amount of liquid medicine. The liquid will cause you to have many bowel movements of loose stool until your stool becomes almost clear or light green. If your skin or the opening between the buttocks (anus) gets irritated from diarrhea, you may relieve the irritation using: Wipes with medicine in them, such as adult wet wipes with aloe and vitamin E. A product to soothe skin, such as petroleum jelly. If you vomit while drinking the bowel prep: Take a break for up to 60 minutes. Begin the bowel prep again. Call your health care provider if you keep vomiting or you cannot take the bowel prep without vomiting. To clean out your colon, you may also be given: Laxative medicines. These help you have a bowel movement. Instructions for enema use. An enema is liquid medicine injected into your rectum. Medicines Ask your health care provider about: Changing or stopping your regular medicines or supplements. This is especially important if you are taking iron supplements, diabetes medicines, or blood thinners. Taking medicines such as aspirin and ibuprofen. These medicines can thin your blood. Do not take these medicines unless your health care provider tells you to take them. Taking over-the-counter medicines, vitamins, herbs, and supplements. General instructions Ask your health care provider what steps will be taken to help prevent infection. These may include  washing skin with a germ-killing soap. Plan to have someone take you home from the hospital or clinic. What happens during the procedure?  An IV will be inserted into one of your  veins. You may be given one or more of the following: A medicine to help you relax (sedative). A medicine to numb the area (local anesthetic). A medicine to make you fall asleep (general anesthetic). This is rarely needed. You will lie on your side with your knees bent. The tube will: Have oil or gel put on it (be lubricated). Be inserted into your anus. Be gently eased through all parts of your large intestine. Air will be sent into your colon to keep it open. This may cause some pressure or cramping. Images will be taken with the camera and will appear on a screen. A small tissue sample may be removed to be looked at under a microscope (biopsy). The tissue may be sent to a lab for testing if any signs of problems are found. If small polyps are found, they may be removed and checked for cancer cells. When the procedure is finished, the tube will be removed. The procedure may vary among health care providers and hospitals. What happens after the procedure? Your blood pressure, heart rate, breathing rate, and blood oxygen level will be monitored until you leave the hospital or clinic. You may have a small amount of blood in your stool. You may pass gas and have mild cramping or bloating in your abdomen. This is caused by the air that was used to open your colon during the exam. Do not drive for 24 hours after the procedure. It is up to you to get the results of your procedure. Ask your health care provider, or the department that is doing the procedure, when your results will be ready. Summary A colonoscopy is a procedure to look at the entire large intestine. Follow instructions from your health care provider about eating and drinking before the procedure. If you were prescribed an oral bowel prep to clean out your colon, take it as told by your health care provider. During the colonoscopy, a flexible tube with a camera on its end is inserted into the anus and then passed into the other  parts of the large intestine. This information is not intended to replace advice given to you by your health care provider. Make sure you discuss any questions you have with your health care provider. Document Revised: 11/07/2018 Document Reviewed: 11/07/2018 Elsevier Patient Education  2022 [place] for Type 2 Diabetes A screening test for type 2 diabetes (type 2 diabetes mellitus) is a blood test to measure your blood sugar (glucose) level. This test is done to check for early signs of diabetes, before you develop symptoms.  Type 2 diabetes is a long-term (chronic) disease. In type 2 diabetes, one or both of these problems may be present: The pancreas does not make enough of a hormone called insulin. Cells in the body do not respond properly to insulin that the body makes (insulin resistance). Normally, insulin allows blood sugar (glucose) to enter cells in the body. The cells use glucose for energy. Insulin resistance or lack of insulin causes excess glucose to build up in the blood instead of going into cells. This results in high blood glucose levels (hyperglycemia), which can cause many complications. You may be screened for type 2 diabetes as part of your regular health care, especially if you have a high risk for diabetes.  Screening can help to identify type 2 diabetes at its early stage (prediabetes). Identifying and treating prediabetes may delay or prevent the development of type 2 diabetes. Tell a health care provider about: All medicines you are taking, including vitamins, herbs, eye drops, creams, and over-the-counter medicines. Any bleeding problems you have. Any medical conditions you have. Whether you are pregnant or may be pregnant. Who should be screened for type 2 diabetes? Adults Adults age 6 and older. These adults should be screened once every three years. Adults who are any age, are overweight, and have one other risk factor. These adults should be  screened once every three years. Adults who have normal blood glucose levels and two or more risk factors. These adults may be screened once every year (annually). Women who have had gestational diabetes in the past. These women should be screened once every three years. Pregnant women who have risk factors. These women should be screened at their first prenatal visit and again between weeks 24 and 28 of pregnancy. Children and adolescents Children and adolescents should be screened for type 2 diabetes if they are overweight and have any of the following risk factors: A family history of type 2 diabetes. Being a member of a high-risk ethnic group. Signs of insulin resistance or conditions that are associated with insulin resistance. A mother who had gestational diabetes while pregnant. Screening should be done at least once every three years, starting at age 98 or at the onset of puberty, whichever comes first. Your health care provider or your child's health care provider may recommend having a screening more or less often. What are the risk factors for type 2 diabetes? The following are factors that may make you more likely to develop type 2 diabetes and can be modified: Not getting enough exercise. Having high blood pressure. Having low levels of good cholesterol (HDL-C) or high levels of blood fats (triglycerides). Having high blood glucose in a previous blood test. Being overweight or obese. The following are factors that may make you more likely to develop type 2 diabetes and can not be modified: Having a parent or sibling (first-degree relative) who has diabetes. Being of American-Indian, African-American, Hispanic/Latino, Asian, or [place] descent. Being older than age 44. Having a history of diabetes during pregnancy (gestational diabetes). Having certain diseases or conditions that may be caused by insulin resistance, including: Acanthosis nigricans. This is a condition  that causes dark skin on the neck, armpits, and groin. Polycystic ovary syndrome (PCOS). Cardiovascular heart disease. What happens during screening? During screening, your health care provider may ask questions about: Your health and your risk factors, including your activity level and any medical conditions that you have. The health of your first-degree relatives. Past pregnancies, if this applies. Your health care provider will also do a physical exam, including a blood pressure measurement and blood tests. There are four blood tests that can be used to screen for type 2 diabetes. You may have one or more of the following: A fasting blood glucose (FBG) test. You will not be allowed to eat (you will fast) for 8 hours or more before a blood sample is taken. A random blood glucose test. This test checks your blood glucose at any time of the day regardless of when you ate. An oral glucose tolerance test (OGTT). This test measures your blood glucose at two times: After you have not eaten (have fasted) overnight. This is your baseline glucose level. Two hours after you drink a  glucose-containing beverage. An A1C (hemoglobin A1C) blood test. This test provides information about blood glucose control over the previous 2-3 months. What do the results mean? Your test results are a measurement of how much glucose is in your blood. Normal blood glucose levels mean that you do not have diabetes or prediabetes. High blood glucose levels may mean that you have prediabetes or diabetes. Depending on the results, other tests may be needed to confirm the diagnosis. You may be diagnosed with type 2 diabetes if: Your FBG level is 126 mg/dL (7.0 mmol/L) or higher. Your random blood glucose level is 200 mg/dL (11.1 mmol/L) or higher. Your A1C level is 6.5% or higher. Your OGTT result is higher than 200 mg/dL (11.1 mmol/L). These blood tests may be repeated to confirm your diagnosis. Talk with your health care  provider about what your results mean. Summary A screening test for type 2 diabetes (type 2 diabetes mellitus) is a blood test to measure your blood sugar (glucose) level. Know what your risk factors are for developing type 2 diabetes. If you are at risk, get screening tests as often as told by your health care provider. Screening may help you identify type 2 diabetes at its early stage (prediabetes). Identifying and treating prediabetes may delay or prevent the development of type 2 diabetes. This information is not intended to replace advice given to you by your health care provider. Make sure you discuss any questions you have with your health care provider. Document Revised: 07/11/2020 Document Reviewed: 07/11/2020 Elsevier Patient Education  [place].  Diabetes Mellitus and [place] care is an important part of your health, especially when you have diabetes. Diabetes may cause you to have problems because of poor blood flow (circulation) to your feet and legs, which can cause your skin to: Become thinner and drier. Break more easily. Heal more slowly. Peel and crack. You may also have nerve damage (neuropathy) in your legs and feet, causing decreased feeling in them. This means that you may not notice minor injuries to your feet that could lead to more serious problems. Noticing and addressing any potential problems early is the best way to prevent future foot problems. How to care for your feet Foot hygiene  Wash your feet daily with warm water and mild soap. Do not use hot water. Then, pat your feet and the areas between your toes until they are completely dry. Do not soak your feet as this can dry your skin. Trim your toenails straight across. Do not dig under them or around the cuticle. File the edges of your nails with an emery board or nail file. Apply a moisturizing lotion or petroleum jelly to the skin on your feet and to dry, brittle toenails. Use lotion that does  not contain alcohol and is unscented. Do not apply lotion between your toes. Shoes and socks Wear clean socks or stockings every day. Make sure they are not too tight. Do not wear knee-high stockings since they may decrease blood flow to your legs. Wear shoes that fit properly and have enough cushioning. Always look in your shoes before you put them on to be sure there are no objects inside. To break in new shoes, wear them for just a few hours a day. This prevents injuries on your feet. Wounds, scrapes, corns, and calluses  Check your feet daily for blisters, cuts, bruises, sores, and redness. If you cannot see the bottom of your feet, use a mirror or ask someone  for help. Do not cut corns or calluses or try to remove them with medicine. If you find a minor scrape, cut, or break in the skin on your feet, keep it and the skin around it clean and dry. You may clean these areas with mild soap and water. Do not clean the area with peroxide, alcohol, or iodine. If you have a wound, scrape, corn, or callus on your foot, look at it several times a day to make sure it is healing and not infected. Check for: Redness, swelling, or pain. Fluid or blood. Warmth. Pus or a bad smell. General tips Do not cross your legs. This may decrease blood flow to your feet. Do not use heating pads or hot water bottles on your feet. They may burn your skin. If you have lost feeling in your feet or legs, you may not know this is happening until it is too late. Protect your feet from hot and cold by wearing shoes, such as at the beach or on hot pavement. Schedule a complete foot exam at least once a year (annually) or more often if you have foot problems. Report any cuts, sores, or bruises to your health care provider immediately. Where to find more information American Diabetes Association: www.diabetes.org Association of Diabetes Care & Education Specialists: www.diabeteseducator.org Contact a health care provider  if: You have a medical condition that increases your risk of infection and you have any cuts, sores, or bruises on your feet. You have an injury that is not healing. You have redness on your legs or feet. You feel burning or tingling in your legs or feet. You have pain or cramps in your legs and feet. Your legs or feet are numb. Your feet always feel cold. You have pain around any toenails. Get help right away if: You have a wound, scrape, corn, or callus on your foot and: You have pain, swelling, or redness that gets worse. You have fluid or blood coming from the wound, scrape, corn, or callus. Your wound, scrape, corn, or callus feels warm to the touch. You have pus or a bad smell coming from the wound, scrape, corn, or callus. You have a fever. You have a red line going up your leg. Summary Check your feet every day for blisters, cuts, bruises, sores, and redness. Apply a moisturizing lotion or petroleum jelly to the skin on your feet and to dry, brittle toenails. Wear shoes that fit properly and have enough cushioning. If you have foot problems, report any cuts, sores, or bruises to your health care provider immediately. Schedule a complete foot exam at least once a year (annually) or more often if you have foot problems. This information is not intended to replace advice given to you by your health care provider. Make sure you discuss any questions you have with your health care provider. Document Revised: 11/05/2019 Document Reviewed: 11/05/2019 Elsevier Patient Education  2022 [place] Prevention in the Home, Adult Falls can cause injuries and can happen to people of all ages. There are many things you can do to make your home safe and to help prevent falls. Ask for help when making these changes. What actions can I take to prevent falls? General Instructions Use good lighting in all rooms. Replace any light bulbs that burn out. Turn on the lights in dark areas. Use  night-lights. Keep items that you use often in easy-to-reach places. Lower the shelves around your home if needed. Set up your furniture so  you have a clear path. Avoid moving your furniture around. Do not have throw rugs or other things on the floor that can make you trip. Avoid walking on wet floors. If any of your floors are uneven, fix them. Add color or contrast paint or tape to clearly mark and help you see: Grab bars or handrails. First and last steps of staircases. Where the edge of each step is. If you use a stepladder: Make sure that it is fully opened. Do not climb a closed stepladder. Make sure the sides of the stepladder are locked in place. Ask someone to hold the stepladder while you use it. Know where your pets are when moving through your home. What can I do in the bathroom?   Keep the floor dry. Clean up any water on the floor right away. Remove soap buildup in the tub or shower. Use nonskid mats or decals on the floor of the tub or shower. Attach bath mats securely with double-sided, nonslip rug tape. If you need to sit down in the shower, use a plastic, nonslip stool. Install grab bars by the toilet and in the tub and shower. Do not use towel bars as grab bars. What can I do in the bedroom? Make sure that you have a light by your bed that is easy to reach. Do not use any sheets or blankets for your bed that hang to the floor. Have a firm chair with side arms that you can use for support when you get dressed. What can I do in the kitchen? Clean up any spills right away. If you need to reach something above you, use a step stool with a grab bar. Keep electrical cords out of the way. Do not use floor polish or wax that makes floors slippery. What can I do with my stairs? Do not leave any items on the stairs. Make sure that you have a light switch at the top and the bottom of the stairs. Make sure that there are handrails on both sides of the stairs. Fix handrails  that are broken or loose. Install nonslip stair treads on all your stairs. Avoid having throw rugs at the top or bottom of the stairs. Choose a carpet that does not hide the edge of the steps on the stairs. Check carpeting to make sure that it is firmly attached to the stairs. Fix carpet that is loose or worn. What can I do on the outside of my home? Use bright outdoor lighting. Fix the edges of walkways and driveways and fix any cracks. Remove anything that might make you trip as you walk through a door, such as a raised step or threshold. Trim any bushes or trees on paths to your home. Check to see if handrails are loose or broken and that both sides of all steps have handrails. Install guardrails along the edges of any raised decks and porches. Clear paths of anything that can make you trip, such as tools or rocks. Have leaves, snow, or ice cleared regularly. Use sand or salt on paths during winter. Clean up any spills in your garage right away. This includes grease or oil spills. What other actions can I take? Wear shoes that: Have a low heel. Do not wear high heels. Have rubber bottoms. Feel good on your feet and fit well. Are closed at the toe. Do not wear open-toe sandals. Use tools that help you move around if needed. These include: Canes. Walkers. Scooters. Crutches. Review your medicines with  your doctor. Some medicines can make you feel dizzy. This can increase your chance of falling. Ask your doctor what else you can do to help prevent falls. Where to find more information Centers for Disease Control and Prevention, [place]: http://www.wolf.info/ National Institute on Aging: http://kim-miller.com/ Contact a doctor if: You are afraid of falling at home. You feel weak, drowsy, or dizzy at home. You fall at home. Summary There are many simple things that you can do to make your home safe and to help prevent falls. Ways to make your home safe include removing things that can make you trip  and installing grab bars in the bathroom. Ask for help when making these changes in your home. This information is not intended to replace advice given to you by your health care provider. Make sure you discuss any questions you have with your health care provider. Document Revised: 11/18/2019 Document Reviewed: 11/18/2019 Elsevier Patient Education  [place] Maintenance, Male Adopting a healthy lifestyle and getting preventive care are important in promoting health and wellness. Ask your health care provider about: The right schedule for you to have regular tests and exams. Things you can do on your own to prevent diseases and keep yourself healthy. What should I know about diet, weight, and exercise? Eat a healthy diet  Eat a diet that includes plenty of vegetables, fruits, low-fat dairy products, and lean protein. Do not eat a lot of foods that are high in solid fats, added sugars, or sodium. Maintain a healthy weight Body mass index (BMI) is a measurement that can be used to identify possible weight problems. It estimates body fat based on height and weight. Your health care provider can help determine your BMI and help you achieve or maintain a healthy weight. Get regular exercise Get regular exercise. This is one of the most important things you can do for your health. Most adults should: Exercise for at least 150 minutes each week. The exercise should increase your heart rate and make you sweat (moderate-intensity exercise). Do strengthening exercises at least twice a week. This is in addition to the moderate-intensity exercise. Spend less time sitting. Even light physical activity can be beneficial. Watch cholesterol and blood lipids Have your blood tested for lipids and cholesterol at 41 years of age, then have this test every 5 years. You may need to have your cholesterol levels checked more often if: Your lipid or cholesterol levels are high. You are older than  41 years of age. You are at high risk for heart disease. What should I know about cancer screening? Many types of cancers can be detected early and may often be prevented. Depending on your health history and family history, you may need to have cancer screening at various ages. This may include screening for: Colorectal cancer. Prostate cancer. Skin cancer. Lung cancer. What should I know about heart disease, diabetes, and high blood pressure? Blood pressure and heart disease High blood pressure causes heart disease and increases the risk of stroke. This is more likely to develop in people who have high blood pressure readings, are of African descent, or are overweight. Talk with your health care provider about your target blood pressure readings. Have your blood pressure checked: Every 3-5 years if you are 24-49 years of age. Every year if you are 81 years old or older. If you are between the ages of 83 and 73 and are a current or former smoker, ask your health care provider if you  should have a one-time screening for abdominal aortic aneurysm (AAA). Diabetes Have regular diabetes screenings. This checks your fasting blood sugar level. Have the screening done: Once every three years after age 55 if you are at a normal weight and have a low risk for diabetes. More often and at a younger age if you are overweight or have a high risk for diabetes. What should I know about preventing infection? Hepatitis B If you have a higher risk for hepatitis B, you should be screened for this virus. Talk with your health care provider to find out if you are at risk for hepatitis B infection. Hepatitis C Blood testing is recommended for: Everyone born from [birth year] through 1966. Anyone with known risk factors for hepatitis C. Sexually transmitted infections (STIs) You should be screened each year for STIs, including gonorrhea and chlamydia, if: You are sexually active and are younger than 41 years of  age. You are older than 41 years of age and your health care provider tells you that you are at risk for this type of infection. Your sexual activity has changed since you were last screened, and you are at increased risk for chlamydia or gonorrhea. Ask your health care provider if you are at risk. Ask your health care provider about whether you are at high risk for HIV. Your health care provider may recommend a prescription medicine to help prevent HIV infection. If you choose to take medicine to prevent HIV, you should first get tested for HIV. You should then be tested every 3 months for as long as you are taking the medicine. Follow these instructions at home: Lifestyle Do not use any products that contain nicotine or tobacco, such as cigarettes, e-cigarettes, and chewing tobacco. If you need help quitting, ask your health care provider. Do not use street drugs. Do not share needles. Ask your health care provider for help if you need support or information about quitting drugs. Alcohol use Do not drink alcohol if your health care provider tells you not to drink. If you drink alcohol: Limit how much you have to 0-2 drinks a day. Be aware of how much alcohol is in your drink. In the [place] one drink equals one 12 oz bottle of beer (355 mL), one 5 oz glass of wine (148 mL), or one 1 oz glass of hard liquor (44 mL). General instructions Schedule regular health, dental, and eye exams. Stay current with your vaccines. Tell your health care provider if: You often feel depressed. You have ever been abused or do not feel safe at home. Summary Adopting a healthy lifestyle and getting preventive care are important in promoting health and wellness. Follow your health care provider's instructions about healthy diet, exercising, and getting tested or screened for diseases. Follow your health care provider's instructions on monitoring your cholesterol and blood pressure. This information is not  intended to replace advice given to you by your health care provider. Make sure you discuss any questions you have with your health care provider. Document Revised: 06/24/2020 Document Reviewed: 04/09/2018 Elsevier Patient Education  2022 [place].  Steps to Quit Smoking Smoking tobacco is the leading cause of preventable death. It can affect almost every organ in the body. Smoking puts you and people around you at risk for many serious, long-lasting (chronic) diseases. Quitting smoking can be hard, but it is one of the best things that you can do for your health. It is never too late to quit. How do I get ready  to quit? When you decide to quit smoking, make a plan to help you succeed. Before you quit: Pick a date to quit. Set a date within the next 2 weeks to give you time to prepare. Write down the reasons why you are quitting. Keep this list in places where you will see it often. Tell your family, friends, and co-workers that you are quitting. Their support is important. Talk with your doctor about the choices that may help you quit. Find out if your health insurance will pay for these treatments. Know the people, places, things, and activities that make you want to smoke (triggers). Avoid them. What first steps can I take to quit smoking? Throw away all cigarettes at home, at work, and in your car. Throw away the things that you use when you smoke, such as ashtrays and lighters. Clean your car. Make sure to empty the ashtray. Clean your home, including curtains and carpets. What can I do to help me quit smoking? Talk with your doctor about taking medicines and seeing a counselor at the same time. You are more likely to succeed when you do both. If you are pregnant or breastfeeding, talk with your doctor about counseling or other ways to quit smoking. Do not take medicine to help you quit smoking unless your doctor tells you to do so. To quit smoking: Quit right away Quit smoking  totally, instead of slowly cutting back on how much you smoke over a period of time. Go to counseling. You are more likely to quit if you go to counseling sessions regularly. Take medicine You may take medicines to help you quit. Some medicines need a prescription, and some you can buy over-the-counter. Some medicines may contain a drug called nicotine to replace the nicotine in cigarettes. Medicines may: Help you to stop having the desire to smoke (cravings). Help to stop the problems that come when you stop smoking (withdrawal symptoms). Your doctor may ask you to use: Nicotine patches, gum, or lozenges. Nicotine inhalers or sprays. Non-nicotine medicine that is taken by mouth. Find resources Find resources and other ways to help you quit smoking and remain smoke-free after you quit. These resources are most helpful when you use them often. They include: Online chats with a Social worker. Phone quitlines. Printed Furniture conservator/restorer. Support groups or group counseling. Text messaging programs. Mobile phone apps. Use apps on your mobile phone or tablet that can help you stick to your quit plan. There are many free apps for mobile phones and tablets as well as websites. Examples include Quit Guide from the [employer] and smokefree.gov  What things can I do to make it easier to quit?  Talk to your family and friends. Ask them to support and encourage you. Call a phone quitline (1-800-QUIT-NOW), reach out to support groups, or work with a Social worker. Ask people who smoke to not smoke around you. Avoid places that make you want to smoke, such as: Bars. Parties. Smoke-break areas at work. Spend time with people who do not smoke. Lower the stress in your life. Stress can make you want to smoke. Try these things to help your stress: Getting regular exercise. Doing deep-breathing exercises. Doing yoga. Meditating. Doing a body scan. To do this, close your eyes, focus on one area of your body at a time  from head to toe. Notice which parts of your body are tense. Try to relax the muscles in those areas. How will I feel when I quit smoking? Day 1 to  3 weeks Within the first 24 hours, you may start to have some problems that come from quitting tobacco. These problems are very bad 2-3 days after you quit, but they do not often last for more than 2-3 weeks. You may get these symptoms: Mood swings. Feeling restless, nervous, angry, or annoyed. Trouble concentrating. Dizziness. Strong desire for high-sugar foods and nicotine. Weight gain. Trouble pooping (constipation). Feeling like you may vomit (nausea). Coughing or a sore throat. Changes in how the medicines that you take for other issues work in your body. Depression. Trouble sleeping (insomnia). Week 3 and afterward After the first 2-3 weeks of quitting, you may start to notice more positive results, such as: Better sense of smell and taste. Less coughing and sore throat. Slower heart rate. Lower blood pressure. Clearer skin. Better breathing. Fewer sick days. Quitting smoking can be hard. Do not give up if you fail the first time. Some people need to try a few times before they succeed. Do your best to stick to your quit plan, and talk with your doctor if you have any questions or concerns. Summary Smoking tobacco is the leading cause of preventable death. Quitting smoking can be hard, but it is one of the best things that you can do for your health. When you decide to quit smoking, make a plan to help you succeed. Quit smoking right away, not slowly over a period of time. When you start quitting, seek help from your doctor, family, or friends. This information is not intended to replace advice given to you by your health care provider. Make sure you discuss any questions you have with your health care provider. Document Revised: 01/09/2019 Document Reviewed: 07/05/2018 Elsevier Patient Education  [place].

## 2021-01-24 NOTE — Progress Notes (Signed)
I connected with  Louis Carney on 01/24/21 by a phone enabled telemedicine application and verified that I am speaking with the correct person using two identifiers.   I discussed the limitations of evaluation and management by telemedicine. The patient expressed understanding and agreed to proceed.  Subjective:   Louis Carney is a 40 y.o. male who presents for Medicare Annual/Subsequent preventive examination.  Review of Systems    Defer to PCP       Objective:    There were no vitals filed for this visit. There is no height or weight on file to calculate BMI.  Advanced Directives 01/24/2021 12/25/2019 01/15/2019 03/07/2018 02/27/2018 01/07/2018 08/26/2017  Does Patient Have a Medical Advance Directive? Yes No No No No No No  Type of Advance Directive Healthcare Power of Attorney;Living will - - - - - -  Does patient want to make changes to medical advance directive? No - Patient declined - - - - - -  Copy of Healthcare Power of Attorney in Chart? No - copy requested - - - - - -  Would patient like information on creating a medical advance directive? - - No - Patient declined No - Patient declined No - Patient declined No - Patient declined No - Patient declined    Current Medications (verified) Outpatient Encounter Medications as of 01/24/2021  Medication Sig   amLODipine (NORVASC) 10 MG tablet Take 1 tablet (10 mg total) by mouth daily.   blood glucose meter kit and supplies Dispense based on patient and insurance preference. Use to check glucose level three times daily before meals and 3 times daily 2 hours after meals (6 times daily total). (FOR ICD-10 E10.9, E11.9).   esomeprazole (NEXIUM) 20 MG capsule Take 20 mg by mouth daily at 12 noon.   gabapentin (NEURONTIN) 300 MG capsule Take 2 capsules (600 mg total) by mouth 2 (two) times daily. (Patient taking differently: Take 600 mg by mouth daily. Patient states he is only taking 300mg daily)   ibuprofen (ADVIL) 200 MG tablet Take  1,200 mg by mouth daily.    insulin aspart (NOVOLOG FLEXPEN) 100 UNIT/ML FlexPen Inject 8-10 Units into the skin 2 (two) times daily before a meal.   insulin glargine (LANTUS SOLOSTAR) 100 UNIT/ML Solostar Pen Inject 20 units into the skin twice daily-12 hours apart.   metoprolol succinate (TOPROL-XL) 100 MG 24 hr tablet Take 1 tablet (100 mg total) by mouth daily. Take with or immediately following a meal.   olmesartan (BENICAR) 40 MG tablet Take 1 tablet (40 mg total) by mouth daily. **NEEDS APT FOR REFILLS**   OneTouch Delica Lancets 33G MISC USE TO TEST BLOOD SUGARS 6 TIMES DAILY (BEFORE MEALS AND 2 HOURS AFTER MEALS)   ONETOUCH VERIO test strip USE TO TEST 6 TIMES DAILY (BEFORE MEALS AND 2 HOURS AFTER MEALS)   Risankizumab-rzaa (SKYRIZI PEN) 150 MG/ML SOAJ Inject 150 mg into the skin as directed. Every 12 weeks for maintenance.   Semaglutide, 1 MG/DOSE, (OZEMPIC, 1 MG/DOSE,) 4 MG/3ML SOPN Inject 1 mg into the skin once a week.   sildenafil (VIAGRA) 100 MG tablet TAKE ONE-HALF TABLET BY MOUTH 30 MINUTES PRIOR TO INTERCOURSE AS NEEDED. NEED APPOINTMENT FOR FUTURE REFILLS   No facility-administered encounter medications on file as of 01/24/2021.    Allergies (verified) Patient has no known allergies.   History: Past Medical History:  Diagnosis Date   Arthritis    Diabetes (HCC)    type 1   GERD (gastroesophageal reflux disease)      Headache    migraines   Hypertension    Psoriasis    since age 36 years   Tuberculosis    latent TB infection on treatment   Past Surgical History:  Procedure Laterality Date   CARPAL TUNNEL RELEASE Right 01/15/2018   Procedure: Right Carpal tunnel release;  Surgeon: Ashok Pall, MD;  Location: [facility];  Service: Neurosurgery;  Laterality: Right;  Right Carpal tunnel release   CERVICAL SPINE SURGERY     LUMBAR LAMINECTOMY/DECOMPRESSION MICRODISCECTOMY Right 03/07/2018   Procedure: Right Lumbar 5 Sacral 1 Microdiscectomy;  Surgeon: Ashok Pall, MD;   Location: [facility];  Service: Neurosurgery;  Laterality: Right;  Right Lumbar 5 Sacral 1 Microdiscectomy   NECK SURGERY     Family History  Problem Relation Age of Onset   Alcohol abuse Mother    Hypertension Mother    Lung cancer Mother    Alcohol abuse Father    Hypertension Father    Colon cancer Father        dx in his late 69's   Esophageal cancer Neg Hx    Rectal cancer Neg Hx    Social History   Socioeconomic History   Marital status: Married    Spouse name: Not on file   Number of children: 5   Years of education: Not on file   Highest education level: Not on file  Occupational History   Occupation: disabled  Tobacco Use   Smoking status: Every Day    Packs/day: 1.00    Years: 15.00    Pack years: 15.00    Types: Cigarettes   Smokeless tobacco: Never  Vaping Use   Vaping Use: Never used  Substance and Sexual Activity   Alcohol use: Yes    Alcohol/week: 24.0 standard drinks    Types: 24 Standard drinks or equivalent per week   Drug use: Never   Sexual activity: Yes    Partners: Female    Birth control/protection: None  Other Topics Concern   Not on file  Social History Narrative   Married, 3 biologic children 2 stepchildren    disabled due to back pain   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  bring in CBG monitor from home?: No     Diabetic?YES  Interpreter Needed?: No      Activities of Daily Living In your present state of health, do you have any difficulty performing the following activities: 01/24/2021 07/06/2020  Hearing? N N  Vision? Y N  Difficulty concentrating or making decisions? N N  Walking or climbing stairs? Y Y  Dressing or bathing? N Y  Doing errands, shopping? Y N  Preparing Food and eating ? N -  Using the Toilet? N -  In the past  six months, have you accidently leaked urine? N -  Do you have problems with loss of bowel control? N -  Managing your Medications? N -  Managing your Finances? N -  Housekeeping or managing your Housekeeping? N -  Some recent data might be hidden    Patient Care Team: Lorrene Reid, PA-C as PCP - [facility] Clark-Burning, Anderson Malta, PA-C (Inactive) (Dermatology) Ashok Pall, MD as Consulting Physician (Neurosurgery) Comer, Okey Regal, MD as Consulting Physician (Infectious Diseases) Philemon Kingdom, MD as Consulting Physician (Endocrinology)  Indicate any recent Medical Services you may have received from other than [facility] providers in the past year (date may be approximate).     Assessment:   This is a routine wellness examination for Josey.  Hearing/Vision screen Hearing Screening - Comments:: Passed Whisper Test   Dietary issues and exercise activities discussed: Current Exercise Habits: Home exercise routine, Time (Minutes): 25, Frequency (Times/Week): 7, Weekly Exercise (Minutes/Week): 175, Intensity: Mild, Exercise limited by: orthopedic condition(s)   Goals Addressed   None   Depression Screen PHQ 2/9 Scores 07/06/2020 03/07/2020 02/17/2020 02/03/2020 01/18/2020 08/13/2019 05/28/2019  PHQ - 2 Score 0 0 0 0 3 0 2  PHQ- 9 Score 0 _0 Fall Risk Fall Risk  01/24/2021 07/06/2020 03/07/2020 02/17/2020 02/03/2020  Falls in the past year? 0 0 _1 Number falls in past yr: 0 - 1 1 0  Injury with Fall? 0 -  1 0 1  Risk for fall due to : No Fall Risks - - Impaired balance/gait;Orthopedic patient -  Follow up - Falls evaluation completed Falls evaluation completed Falls evaluation completed Falls evaluation completed    [place]:  Any stairs in or around the home? Yes  If so, are there any without handrails? No  Home free of loose throw rugs in walkways, pet beds, electrical cords, etc? Yes  Adequate lighting in your home to reduce risk of falls? Yes   ASSISTIVE DEVICES UTILIZED TO PREVENT FALLS:  Life alert? No  Use of a cane, walker or w/c? No  Grab bars in the bathroom? Yes  Shower chair or bench in shower? No  Elevated toilet seat or a handicapped toilet? No   TIMED UP AND GO:  Was the test performed? No .  Length of time to ambulate 10 feet: 0 sec.   Gait steady and fast without use of assistive device  Cognitive Function:     6CIT Screen 01/24/2021  What Year? 0 points  What month? 0 points  What time? 0 points  Count back from 20 0 points  Months in reverse 0 points  Repeat phrase 0 points  Total Score 0    Immunizations  There is no immunization history on file for this patient.  TDAP status: Due, Education has been provided regarding the importance of this vaccine. Advised may receive this vaccine at local pharmacy or Health Dept. Aware to provide a copy of the vaccination record if obtained from local pharmacy or Health Dept. Verbalized acceptance and understanding.  Flu Vaccine status: Due, Education has been provided regarding the importance of this vaccine. Advised may receive this vaccine at local pharmacy or Health Dept. Aware to provide a copy of the vaccination record if obtained from local pharmacy or Health Dept. Verbalized acceptance and understanding.  Pneumococcal vaccine status: Due, Education has been provided regarding the importance of this vaccine. Advised may receive this vaccine at local  pharmacy or Health Dept.  Aware to provide a copy of the vaccination record if obtained from local pharmacy or Health Dept. Verbalized acceptance and understanding.  Covid-19 vaccine status: Information provided on how to obtain vaccines.   Qualifies for Shingles Vaccine? No   Zostavax completed No   Shingrix Completed?: No.    Education has been provided regarding the importance of this vaccine. Patient has been advised to call insurance company to determine out of pocket expense if they have not yet received this vaccine. Advised may also receive vaccine at local pharmacy or Health Dept. Verbalized acceptance and understanding.  Screening Tests Health Maintenance  Topic Date Due   COVID-19 Vaccine (1) Never done   TETANUS/TDAP  Never done   PAP SMEAR-Modifier  Never done   OPHTHALMOLOGY EXAM  01/29/2020   FOOT EXAM  02/05/2020   INFLUENZA VACCINE  Never done   HEMOGLOBIN A1C  01/22/2021   Hepatitis C Screening  Completed   HIV Screening  Completed   HPV VACCINES  Aged Out    Health Maintenance  Health Maintenance Due  Topic Date Due   COVID-19 Vaccine (1) Never done   TETANUS/TDAP  Never done   PAP SMEAR-Modifier  Never done   OPHTHALMOLOGY EXAM  01/29/2020   FOOT EXAM  02/05/2020   INFLUENZA VACCINE  Never done   HEMOGLOBIN A1C  01/22/2021    Colorectal cancer screening: Referral to GI placed 01/24/2021. Pt aware the office will call re: appt.  Lung Cancer Screening: (Low Dose CT Chest recommended if Age 93-80 years, 30 pack-year currently smoking OR have quit w/in 15years.) does qualify.   Lung Cancer Screening Referral: NONE   Additional Screening:  Hepatitis C Screening: does qualify; Completed   Vision Screening: Recommended annual ophthalmology exams for early detection of glaucoma and other disorders of the eye. Is the patient up to date with their annual eye exam?  No Who is the provider or what is the name of the office in which the patient attends annual eye exams?  If pt is not  established with a provider, would they like to be referred to a provider to establish care? Yes .   Dental Screening: Recommended annual dental exams for proper oral hygiene  Community Resource Referral / Chronic Care Management: CRR required this visit?  No   CCM required this visit?  No      Plan:     I have personally reviewed and noted the following in the patient's chart:   Medical and social history Use of alcohol, tobacco or illicit drugs  Current medications and supplements including opioid prescriptions. Patient is not currently taking opioid prescriptions. Functional ability and status Nutritional status Physical activity Advanced directives List of other physicians Hospitalizations, surgeries, and ER visits in previous 12 months Vitals Screenings to include cognitive, depression, and falls Referrals and appointments  In addition, I have reviewed and discussed with patient certain preventive protocols, quality metrics, and best practice recommendations. A written personalized care plan for preventive services as well as general preventive health recommendations were provided to patient.     Stephan Minister, [place]   01/24/2021

## 2021-02-06 ENCOUNTER — Other Ambulatory Visit: Payer: Self-pay | Admitting: Physician Assistant

## 2021-02-06 DIAGNOSIS — E1159 Type 2 diabetes mellitus with other circulatory complications: Secondary | ICD-10-CM

## 2021-02-06 DIAGNOSIS — I152 Hypertension secondary to endocrine disorders: Secondary | ICD-10-CM

## 2021-02-06 DIAGNOSIS — N529 Male erectile dysfunction, unspecified: Secondary | ICD-10-CM

## 2021-02-20 ENCOUNTER — Ambulatory Visit (INDEPENDENT_AMBULATORY_CARE_PROVIDER_SITE_OTHER): Payer: Medicare Other | Admitting: Internal Medicine

## 2021-02-20 ENCOUNTER — Other Ambulatory Visit: Payer: Self-pay

## 2021-02-20 ENCOUNTER — Encounter: Payer: Self-pay | Admitting: Internal Medicine

## 2021-02-20 VITALS — BP 138/88 | HR 87 | Ht 68.0 in | Wt 205.4 lb

## 2021-02-20 DIAGNOSIS — E669 Obesity, unspecified: Secondary | ICD-10-CM | POA: Diagnosis not present

## 2021-02-20 DIAGNOSIS — E1169 Type 2 diabetes mellitus with other specified complication: Secondary | ICD-10-CM | POA: Diagnosis not present

## 2021-02-20 DIAGNOSIS — E1165 Type 2 diabetes mellitus with hyperglycemia: Secondary | ICD-10-CM | POA: Diagnosis not present

## 2021-02-20 DIAGNOSIS — E1065 Type 1 diabetes mellitus with hyperglycemia: Secondary | ICD-10-CM | POA: Diagnosis not present

## 2021-02-20 DIAGNOSIS — E782 Mixed hyperlipidemia: Secondary | ICD-10-CM | POA: Diagnosis not present

## 2021-02-20 DIAGNOSIS — Z794 Long term (current) use of insulin: Secondary | ICD-10-CM

## 2021-02-20 DIAGNOSIS — E781 Pure hyperglyceridemia: Secondary | ICD-10-CM | POA: Diagnosis not present

## 2021-02-20 LAB — POCT GLYCOSYLATED HEMOGLOBIN (HGB A1C): Hemoglobin A1C: 5.5 % (ref 4.0–5.6)

## 2021-02-20 LAB — LIPID PANEL
Cholesterol: 201 mg/dL — ABNORMAL HIGH (ref 0–200)
HDL: 33.6 mg/dL — ABNORMAL LOW (ref >39.00)
NonHDL: 166.99
Total CHOL/HDL Ratio: 6
Triglycerides: 295 mg/dL — ABNORMAL HIGH (ref 0.0–149.0)
VLDL: 59 mg/dL — ABNORMAL HIGH (ref 0.0–40.0)

## 2021-02-20 LAB — LDL CHOLESTEROL, DIRECT: Direct LDL: 143 mg/dL

## 2021-02-20 LAB — MICROALBUMIN / CREATININE URINE RATIO
Creatinine,U: 406.2 mg/dL
Microalb Creat Ratio: 4.6 mg/g (ref 0.0–30.0)
Microalb, Ur: 18.6 mg/dL — ABNORMAL HIGH (ref 0.0–1.9)

## 2021-02-20 MED ORDER — LANTUS SOLOSTAR 100 UNIT/ML ~~LOC~~ SOPN: PEN_INJECTOR | SUBCUTANEOUS | 3 refills | Status: DC

## 2021-02-20 NOTE — Progress Notes (Signed)
Patient ID: Louis Carney, adult   DOB: 1980/05/06, 40 y.o.   MRN: [id number]   This visit occurred during the SARS-CoV-2 public health emergency.  Safety protocols were in place, including screening questions prior to the visit, additional usage of staff PPE, and extensive cleaning of exam room while observing appropriate contact time as indicated for disinfecting solutions.   HPI: Louis Carney is a 40 y.o.-year-old adult, initially referred by his PCP, Dr. Raliegh Scarlet, returning for follow-up for DM2, dx in 09/2018, insulin-dependent, uncontrolled, without long-term complications.  Last visit 7 months ago.  He is not usually compliant with appointments.  Interim history: No increased urination, blurry vision, nausea, chest pain. Has back pain - on disability. He is in the process of separating from his wife.  He is now only living with his 69 year old son. After his wife moved away, he cut down drinking and started to improve diet - cutting down carbs. Lost 20-25 lbs per his scale at home.  He feels much better and sleeps better.  He is now also going to the gym. 3 months ago he ran out of NovoLog and could not refill it.  Yesterday he ran out of Lantus.  Reviewed history: He had dizziness, blurry vision >> checked sugar with neighbor's meter >> CBG high (HI, then rechecked: 540).  He saw PCP >> HbA1c was high x2 >> added insulin.  He felt much better after adding insulin.  However, sugars were still extremely high at last visit so we added rapid acting insulin and I made specific suggestions about his diet and alcohol intake.  Reviewed HbA1c levels: Lab Results  Component Value Date   HGBA1C 8.3 (A) 07/22/2020   HGBA1C 6.4 (A) 01/18/2020   HGBA1C 6.2 (A) 08/21/2019   HGBA1C 6.0 (A) 01/20/2019   HGBA1C 9.9 (A) 10/27/2018   HGBA1C 10.2 (H) 10/27/2018   HGBA1C 5.6 03/06/2018   HGBA1C 5.3 08/26/2017   Previously on: - Lantus 25 units 2x a day >> 24 units 2x a day - Novolog 12-14 units 15 min  before lunch and dinner - added 10/2018  Currently on: - Ozempic 0.5 >> 1 mg weekly >> no nausea - Lantus 25 units 2x a day >> 18 units 2x a day >> 22 units 2x a day >> 22 units 1x a day - Novolog 8-14 units 15 min before lunch and dinner >> 14 >> 8-10 >> 15 >> 8-10 units before a larger meal  >> ran out 3 mo ago  Pt checks his sugars once a day: - am: 202-300 >> 105-132 >> 107-140 >> 97-126 >> 126-164 >> 78-110 - 2h after b'fast: n/c - before lunch: 300-340 >> <130 >> 140s >> 97-126 >> n/c - 2h after lunch: n/c - before dinner: 340 >> 128-132 >> n/c - 2h after dinner: n/c - bedtime: 340 >> 130s >> up to 189 >> 106-126 >> 124-168  >> up to 110 - nighttime: n/c Lowest sugar was 97 >> 124  >> 78; it is unclear at which CBG level he has hypoglycemia awareness. Highest sugar was 460 ... >> 202  >> 110.  Glucometer:ReliOn  Pt's meals are: - Breakfast: skips - Lunch: [place] sandwich  - Dinner: lasagna, hamburger, steak + potatoes, pasta - Snacks:cookies, chips. He does not like greens. In the past, he was drinking 4-6 shots of vodka, 6 pack beer EVERY night >> then 5 Bud Light beers a night >> then liquor/vodka >> now off.  -No CKD.  Last BUN/creatinine:  Lab Results  Component Value Date   BUN 13 12/25/2019   BUN 8 11/06/2018   CREATININE 0.61 12/25/2019   CREATININE 0.79 11/06/2018  On olmesartan.  -He had significant hypertriglyceridemia, which improved at last check; last set of lipids: Lab Results  Component Value Date   CHOL 267 (H) 01/20/2019   HDL 32.90 (L) 01/20/2019   LDLCALC Comment 10/27/2018   LDLDIRECT 184.0 01/20/2019   TRIG 386.0 (H) 01/20/2019   CHOLHDL 8 01/20/2019    He has a history of transaminitis: Lab Results  Component Value Date   ALT 102 (H) 01/20/2019   AST 119 (H) 01/20/2019   ALKPHOS 50 01/20/2019   BILITOT 0.7 01/20/2019  He sees Dr. Carlean Purl with [facility].    - last eye exam was in 2021: No DR reportedly.  -+ numbness and tingling  in his R leg - from back pain - see Dr. Cyndy Freeze.  Pt has FH of DM in father - type 2 DM. + significant alcoholism FH.  He is on Risankizumab in 04/2018 for psoriasis >> working. Prev. Embrel, Humira, etc. his psoriasis is much improved on meds.  No history of pancreatitis or personal or family history of medullary thyroid cancer.  ROS: + See HPI Musculoskeletal: no muscle aches/+ joint aches - back pain Skin: + Rash on hands (psoriasis) >> much improved   I reviewed pt's medications, allergies, PMH, social hx, family hx, and changes were documented in the history of present illness. Otherwise, unchanged from my initial visit note.  Past Medical History:  Diagnosis Date   Arthritis    Diabetes ([place])    type 1   GERD (gastroesophageal reflux disease)    Headache    migraines   Hypertension    Psoriasis    since age 50 years   Tuberculosis    latent TB infection on treatment   Past Surgical History:  Procedure Laterality Date   CARPAL TUNNEL RELEASE Right 01/15/2018   Procedure: Right Carpal tunnel release;  Surgeon: Ashok Pall, MD;  Location: [facility];  Service: Neurosurgery;  Laterality: Right;  Right Carpal tunnel release   CERVICAL SPINE SURGERY     LUMBAR LAMINECTOMY/DECOMPRESSION MICRODISCECTOMY Right 03/07/2018   Procedure: Right Lumbar 5 Sacral 1 Microdiscectomy;  Surgeon: Ashok Pall, MD;  Location: [facility];  Service: Neurosurgery;  Laterality: Right;  Right Lumbar 5 Sacral 1 Microdiscectomy   NECK SURGERY     Social History   Socioeconomic History   Marital status: Married    Spouse name: Not on file   Number of children: 5   Years of education: Not on file   Highest education level: Not on file  Occupational History   Occupation: disabled  Tobacco Use   Smoking status: Every Day    Packs/day: 1.00    Years: 15.00    Pack years: 15.00    Types: Cigarettes   Smokeless tobacco: Never  Vaping Use   Vaping Use: Never used  Substance and Sexual Activity    Alcohol use: Yes    Alcohol/week: 24.0 standard drinks    Types: 24 Standard drinks or equivalent per week   Drug use: Never   Sexual activity: Yes    Partners: Female    Birth control/protection: None  Other Topics Concern   Not on file  Social History Narrative   Married, 3 biologic children 2 stepchildren    disabled due to back pain   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 [place]   6-8 beers nightly   Smoker   No drug use   Social Determinants of Radio broadcast assistant Strain: Not on file  Food Insecurity: Not on file  Transportation Needs: No Transportation Needs   Lack of Transportation (Medical): No   Lack of Transportation (Non-Medical): No  Physical Activity: Inactive   Days of Exercise per Week: 0 days   Minutes of Exercise per Session: 0 min  Stress: Not on file  Social Connections: Not on file  Intimate Partner Violence: Not on file   Current Outpatient Medications on File Prior to Visit  Medication Sig Dispense Refill   amLODipine (NORVASC) 10 MG tablet Take 1 tablet (10 mg total) by mouth daily. 90 tablet 1   blood glucose meter kit and supplies Dispense based on patient and insurance preference. Use to check glucose level three times daily before meals and 3 times daily 2 hours after meals (6 times daily total). (FOR ICD-10 E10.9, E11.9). 1 each 0   esomeprazole (NEXIUM) 20 MG capsule Take 20 mg by mouth daily at 12 noon.     gabapentin (NEURONTIN) 300 MG capsule Take 2 capsules (600 mg total) by mouth 2 (two) times daily. (Patient taking differently: Take 600 mg by mouth daily. Patient states he is only taking 358m daily) 360 capsule 0   ibuprofen (ADVIL) 200 MG tablet Take 1,200 mg by mouth daily.      insulin aspart (NOVOLOG FLEXPEN) 100 UNIT/ML FlexPen Inject 8-10 Units into the skin 2 (two) times daily before a meal. 15 mL 11   insulin glargine (LANTUS SOLOSTAR) 100 UNIT/ML Solostar Pen Inject 20 units  into the skin twice daily-12 hours apart. 30 mL 3   metoprolol succinate (TOPROL-XL) 100 MG 24 hr tablet Take 1 tablet (100 mg total) by mouth daily. Take with or immediately following a meal. 90 tablet 1   olmesartan (BENICAR) 40 MG tablet Take 1 tablet by mouth daily 30 tablet 0   OneTouch Delica Lancets 310GMISC USE TO TEST BLOOD SUGARS 6 TIMES DAILY (BEFORE MEALS AND 2 HOURS AFTER MEALS) 200 each 0   ONETOUCH VERIO test strip USE TO TEST 6 TIMES DAILY (BEFORE MEALS AND 2 HOURS AFTER MEALS) 200 each 0   Risankizumab-rzaa (SKYRIZI PEN) 150 MG/ML SOAJ Inject 150 mg into the skin as directed. Every 12 weeks for maintenance. 1 mL 3   Semaglutide, 1 MG/DOSE, (OZEMPIC, 1 MG/DOSE,) 4 MG/3ML SOPN Inject 1 mg into the skin once a week. 9 mL 3   sildenafil (VIAGRA) 100 MG tablet TAKE ONE-HALF TABLET BY MOUTH 30 MINUTES PRIOR TO INTERCOURSE AS NEEDED (PLEASE SCHEDULE APPOINTMENT FOR FUTURE REFILLS) 15 tablet 0   No current facility-administered medications on file prior to visit.   No Known Allergies Family History  Problem Relation Age of Onset   Alcohol abuse Mother    Hypertension Mother    Lung cancer Mother    Alcohol abuse Father    Hypertension Father    Colon cancer Father        dx in his late 523's  Esophageal cancer Neg Hx    Rectal cancer Neg Hx     PE: BP 138/88 (BP Location: Right Arm, Patient Position: Sitting, Cuff Size: Normal)   Pulse 87   Ht '5\' 8"'  (1.727 m)   Wt 205 lb 6.4 oz (93.2 kg)   SpO2 96%   BMI 31.23 kg/m  Wt Readings from Last 3 Encounters:  02/20/21  205 lb 6.4 oz (93.2 kg)  07/22/20 221 lb 3.2 oz (100.3 kg)  07/06/20 216 lb (98 kg)   Constitutional: overweight, in NAD Eyes: PERRLA, EOMI, no exophthalmos ENT: moist mucous membranes, no thyromegaly, no cervical lymphadenopathy Cardiovascular: RRR, No MRG Respiratory: CTA B Gastrointestinal: abdomen soft, NT, ND, BS+ Musculoskeletal: no deformities, strength intact in all 4 Skin: moist, warm, no  rashes Neurological: no tremor with outstretched hands, DTR normal in all 4  ASSESSMENT: 1. DM, insulin-dependent, without long-term complications -We ruled out antipancreatic autoimmunity and insulin deficiency  2. HTG  3.  Obesity class I  PLAN:  1. Patient with insulin-dependent diabetes, with improved blood sugars after he started to improve his diet.  HbA1c decreased to 6.2%, but then started to increase and at last visit was much higher, at 8.3%.  Upon questioning, he restarted to drink alcohol and we discussed about the absolute importance of decreasing and then stopping it.  At that time, we also increased Ozempic, decreased the dose of Lantus and increase the dose of NovoLog.  Upon questioning, he was injecting insulin only in a small region of his abdomen, close to the umbilicus and I advised him to rotate the sites more. - unfortunately, CGM was not covered for him in the past. -At today's visit, after he stopped drinking and started to improve his diet and go to the gym, sugars improved significantly.  No lows.  His highest blood sugar measurement was 110.  This is excellent control.  At this visit, we discussed about continuing to stay off NovoLog and I also advised him to reduce the Lantus dose further.  He already reduced it from 22 units twice a day to once a day, but at this visit I advised him to reduce the dose further to 16 units daily. - I suggested to:  Patient Instructions  Please continue: - Ozempic 1 mg weekly  Please reduce: - Lantus 16 units daily  Please return in 4-6 months with your sugar log.    - we checked his HbA1c: 5.5% (excellent improvement) - advised to check sugars at different times of the day - 1-2x a day, rotating check times - advised for yearly eye exams >> he is not UTD as he does not have eye insurance.  He would like to restart working (disability) and hopes that he can get eye insurance. - return to clinic in 4-6 months  2. HTG -Reviewed  latest lipid panel from 12/2018: Triglycerides and LDL very high, HDL low: Lab Results  Component Value Date   CHOL 267 (H) 01/20/2019   HDL 32.90 (L) 01/20/2019   LDLCALC Comment 10/27/2018   LDLDIRECT 184.0 01/20/2019   TRIG 386.0 (H) 01/20/2019   CHOLHDL 8 01/20/2019  -Stopping alcohol should definitely help with improving triglyceride levels -He is not on a statin -He is due for another lipid panel -we will check this today  3.  Obesity class I -We discussed in the past about cutting out alcohol, starches, fatty foods and snacks.  He was also constantly eating between meals and I strongly advised him to try to eliminate snacks but snack on fruit if absolutely needed.  At this visit, he tells me that he cut out alcohol. -We will continue Ozempic which should also help with weight loss -He lost 11 pounds before last visit -Since then, he lost approximately 16 pounds!  Component     Latest Ref Rng & Units 02/20/2021  Glucose     65 - 99 mg/dL 82  BUN     7 - 25 mg/dL 15  Creatinine     0.60 - 1.29 mg/dL 0.71  eGFR     > OR = 60 mL/min/1.24m 118  BUN/Creatinine Ratio     6 - 22 (calc) NOT APPLICABLE  Sodium     1681- 146 mmol/L 138  Potassium     3.5 - 5.3 mmol/L 4.8  Chloride     98 - 110 mmol/L 99  CO2     20 - 32 mmol/L 24  Calcium     8.6 - 10.3 mg/dL 10.1  Total Protein     6.1 - 8.1 g/dL 7.2  Albumin MSPROF     3.6 - 5.1 g/dL 4.5  Globulin     1.9 - 3.7 g/dL (calc) 2.7  AG Ratio     1.0 - 2.5 (calc) 1.7  Total Bilirubin     0.2 - 1.2 mg/dL 1.1  Alkaline phosphatase (APISO)     36 - 130 U/L 45  AST     10 - 40 U/L 21  ALT     9 - 46 U/L 25  Cholesterol     0 - 200 mg/dL 201 (H)  Triglycerides     0.0 - 149.0 mg/dL 295.0 (H)  HDL Cholesterol     >39.00 mg/dL 33.60 (L)  VLDL     0.0 - 40.0 mg/dL 59.0 (H)  Total CHOL/HDL Ratio      6  NonHDL      166.99  Microalb, Ur     0.0 - 1.9 mg/dL 18.6 (H)  Creatinine,U     mg/dL 406.2   MICROALB/CREAT RATIO     0.0 - 30.0 mg/g 4.6  Hemoglobin A1C     4.0 - 5.6 % 5.5  Direct LDL     mg/dL 143.0   Triglyceride and LDL improved, but still elevated.  I would suggest to start Crestor 10 mg daily. Will send a prescription to his pharmacy. The rest of the labs are at goal.  CPhilemon Kingdom MD PhD LCavalier County Memorial Hospital AssociationEndocrinology

## 2021-02-20 NOTE — Patient Instructions (Addendum)
Please continue: - Ozempic 1 mg weekly  Please reduce: - Lantus 16 units daily  Please return in 4-6 months with your sugar log.

## 2021-02-21 LAB — COMPLETE METABOLIC PANEL WITH GFR
AG Ratio: 1.7 (calc) (ref 1.0–2.5)
ALT: 25 U/L (ref 9–46)
AST: 21 U/L (ref 10–40)
Albumin: 4.5 g/dL (ref 3.6–5.1)
Alkaline phosphatase (APISO): 45 U/L (ref 36–130)
BUN: 15 mg/dL (ref 7–25)
CO2: 24 mmol/L (ref 20–32)
Calcium: 10.1 mg/dL (ref 8.6–10.3)
Chloride: 99 mmol/L (ref 98–110)
Creat: 0.71 mg/dL (ref 0.60–1.29)
Globulin: 2.7 g/dL (calc) (ref 1.9–3.7)
Glucose, Bld: 82 mg/dL (ref 65–99)
Potassium: 4.8 mmol/L (ref 3.5–5.3)
Sodium: 138 mmol/L (ref 135–146)
Total Bilirubin: 1.1 mg/dL (ref 0.2–1.2)
Total Protein: 7.2 g/dL (ref 6.1–8.1)
eGFR: 118 mL/min/{1.73_m2} (ref >=60)

## 2021-02-21 MED ORDER — ROSUVASTATIN CALCIUM 10 MG PO TABS: 10.0000 mg | ORAL_TABLET | Freq: Every day | ORAL | 3 refills | Status: DC

## 2021-02-23 ENCOUNTER — Other Ambulatory Visit: Payer: Self-pay | Admitting: Physician Assistant

## 2021-02-23 DIAGNOSIS — N529 Male erectile dysfunction, unspecified: Secondary | ICD-10-CM

## 2021-02-27 ENCOUNTER — Other Ambulatory Visit: Payer: Self-pay | Admitting: Physician Assistant

## 2021-02-27 DIAGNOSIS — N529 Male erectile dysfunction, unspecified: Secondary | ICD-10-CM

## 2021-02-28 ENCOUNTER — Other Ambulatory Visit: Payer: Self-pay | Admitting: Physician Assistant

## 2021-02-28 DIAGNOSIS — N529 Male erectile dysfunction, unspecified: Secondary | ICD-10-CM

## 2021-03-02 ENCOUNTER — Other Ambulatory Visit: Payer: Self-pay | Admitting: Physician Assistant

## 2021-03-02 DIAGNOSIS — N529 Male erectile dysfunction, unspecified: Secondary | ICD-10-CM

## 2021-03-06 ENCOUNTER — Other Ambulatory Visit: Payer: Self-pay | Admitting: Physician Assistant

## 2021-03-06 DIAGNOSIS — N529 Male erectile dysfunction, unspecified: Secondary | ICD-10-CM

## 2021-03-07 ENCOUNTER — Other Ambulatory Visit: Payer: Self-pay | Admitting: Physician Assistant

## 2021-03-07 DIAGNOSIS — N529 Male erectile dysfunction, unspecified: Secondary | ICD-10-CM

## 2021-03-09 ENCOUNTER — Encounter: Payer: Self-pay | Admitting: Physician Assistant

## 2021-03-09 ENCOUNTER — Ambulatory Visit (INDEPENDENT_AMBULATORY_CARE_PROVIDER_SITE_OTHER): Payer: Medicare Other | Admitting: Physician Assistant

## 2021-03-09 ENCOUNTER — Other Ambulatory Visit: Payer: Self-pay

## 2021-03-09 VITALS — BP 110/74 | HR 80 | Temp 98.5°F | Ht 68.0 in | Wt 201.0 lb

## 2021-03-09 DIAGNOSIS — N529 Male erectile dysfunction, unspecified: Secondary | ICD-10-CM

## 2021-03-09 DIAGNOSIS — E669 Obesity, unspecified: Secondary | ICD-10-CM

## 2021-03-09 DIAGNOSIS — Z6830 Body mass index (BMI) 30.0-30.9, adult: Secondary | ICD-10-CM

## 2021-03-09 DIAGNOSIS — Z716 Tobacco abuse counseling: Secondary | ICD-10-CM

## 2021-03-09 DIAGNOSIS — E1169 Type 2 diabetes mellitus with other specified complication: Secondary | ICD-10-CM

## 2021-03-09 DIAGNOSIS — E785 Hyperlipidemia, unspecified: Secondary | ICD-10-CM

## 2021-03-09 DIAGNOSIS — E1159 Type 2 diabetes mellitus with other circulatory complications: Secondary | ICD-10-CM | POA: Diagnosis not present

## 2021-03-09 DIAGNOSIS — Z794 Long term (current) use of insulin: Secondary | ICD-10-CM | POA: Diagnosis not present

## 2021-03-09 DIAGNOSIS — I152 Hypertension secondary to endocrine disorders: Secondary | ICD-10-CM

## 2021-03-09 MED ORDER — SILDENAFIL CITRATE 100 MG PO TABS: ORAL_TABLET | ORAL | 0 refills | Status: DC

## 2021-03-09 NOTE — Progress Notes (Signed)
Established Patient Office Visit  Subjective:  Patient ID: Louis Carney, male    DOB: [birth year]  Age: 40 y.o. MRN: [id number]  CC:  Chief Complaint  Patient presents with   Follow-up   Medication Refill   Hypertension    HPI Louis Carney presents for follow up on hypertension and diabetes mellitus. Patient reports is in the process of marital separation and divorce. States has been working on diet changes by reducing simple carbohydrates, glucose and fat intake. Also went from eating one meal per day to 3 small meals. Started going to the gym.  HTN: Pt denies chest pain, palpitations, dizziness, headache or lower extremity swelling. Taking medication as directed without side effects. Checks BP at home and readings range <130/80 with pulse 70-80s.   Diabetes mellitus: Followed by Endocrinology. Pt denies increased urination or thirst. Pt reports medication compliance. No hypoglycemic events. Has eliminated alcohol use. Checking glucose at home, FBS <120.  HLD: Pt taking medication as directed without issues. Patient has made dietary changes by reducing fat intake and increased exercise.  ED: Reports takes medication few times per month or sometimes even less.   Tobacco use: States has cut back 0.5 PPD, used to smoke 2.5 PPD.  Past Medical History:  Diagnosis Date   Arthritis    Diabetes ([place])    type 1   GERD (gastroesophageal reflux disease)    Headache    migraines   Hypertension    Psoriasis    since age 30 years   Tuberculosis    latent TB infection on treatment    Past Surgical History:  Procedure Laterality Date   CARPAL TUNNEL RELEASE Right 01/15/2018   Procedure: Right Carpal tunnel release;  Surgeon: Ashok Pall, MD;  Location: [place];  Service: Neurosurgery;  Laterality: Right;  Right Carpal tunnel release   CERVICAL SPINE SURGERY     LUMBAR LAMINECTOMY/DECOMPRESSION MICRODISCECTOMY Right 03/07/2018   Procedure: Right Lumbar 5 Sacral 1 Microdiscectomy;   Surgeon: Ashok Pall, MD;  Location: [facility];  Service: Neurosurgery;  Laterality: Right;  Right Lumbar 5 Sacral 1 Microdiscectomy   NECK SURGERY      Family History  Problem Relation Age of Onset   Alcohol abuse Mother    Hypertension Mother    Lung cancer Mother    Alcohol abuse Father    Hypertension Father    Colon cancer Father        dx in his late 71's   Esophageal cancer Neg Hx    Rectal cancer Neg Hx     Social History   Socioeconomic History   Marital status: Married    Spouse name: Not on file   Number of children: 5   Years of education: Not on file   Highest education level: Not on file  Occupational History   Occupation: disabled  Tobacco Use   Smoking status: Every Day    Packs/day: 1.00    Years: 15.00    Pack years: 15.00    Types: Cigarettes   Smokeless tobacco: Never  Vaping Use   Vaping Use: Never used  Substance and Sexual Activity   Alcohol use: Yes    Alcohol/week: 24.0 standard drinks    Types: 24 Standard drinks or equivalent per week   Drug use: Never   Sexual activity: Yes    Partners: Female    Birth control/protection: None  Other Topics Concern   Not on file  Social History Narrative   Married, 3 biologic children 2  stepchildren    disabled due to back pain   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   6-8 beers nightly   Smoker   No drug use   Social Determinants of Radio broadcast assistant Strain: Not on file  Food Insecurity: Not on file  Transportation Needs: No Transportation Needs   Lack of Transportation (Medical): No   Lack of Transportation (Non-Medical): No  Physical Activity: Inactive   Days of Exercise per Week: 0 days   Minutes of Exercise per Session: 0 min  Stress: Not on file  Social Connections: Not on file  Intimate Partner Violence: Not on file    Outpatient Medications Prior to Visit  Medication Sig Dispense Refill   amLODipine (NORVASC) 10 MG  tablet Take 1 tablet (10 mg total) by mouth daily. 90 tablet 1   blood glucose meter kit and supplies Dispense based on patient and insurance preference. Use to check glucose level three times daily before meals and 3 times daily 2 hours after meals (6 times daily total). (FOR ICD-10 E10.9, E11.9). 1 each 0   esomeprazole (NEXIUM) 20 MG capsule Take 20 mg by mouth daily at 12 noon.     gabapentin (NEURONTIN) 300 MG capsule Take 2 capsules (600 mg total) by mouth 2 (two) times daily. (Patient taking differently: Take 600 mg by mouth daily. Patient states he is only taking 348m daily) 360 capsule 0   ibuprofen (ADVIL) 200 MG tablet Take 1,200 mg by mouth daily.      insulin glargine (LANTUS SOLOSTAR) 100 UNIT/ML Solostar Pen Inject 20 units into the skin twice daily-12 hours apart. 30 mL 3   metoprolol succinate (TOPROL-XL) 100 MG 24 hr tablet Take 1 tablet (100 mg total) by mouth daily. Take with or immediately following a meal. 90 tablet 1   olmesartan (BENICAR) 40 MG tablet Take 1 tablet by mouth daily 30 tablet 0   OneTouch Delica Lancets 385UMISC USE TO TEST BLOOD SUGARS 6 TIMES DAILY (BEFORE MEALS AND 2 HOURS AFTER MEALS) 200 each 0   ONETOUCH VERIO test strip USE TO TEST 6 TIMES DAILY (BEFORE MEALS AND 2 HOURS AFTER MEALS) 200 each 0   Risankizumab-rzaa (SKYRIZI PEN) 150 MG/ML SOAJ Inject 150 mg into the skin as directed. Every 12 weeks for maintenance. 1 mL 3   rosuvastatin (CRESTOR) 10 MG tablet Take 1 tablet (10 mg total) by mouth daily. 90 tablet 3   Semaglutide, 1 MG/DOSE, (OZEMPIC, 1 MG/DOSE,) 4 MG/3ML SOPN Inject 1 mg into the skin once a week. 9 mL 3   sildenafil (VIAGRA) 100 MG tablet TAKE ONE-HALF TABLET BY MOUTH 30 MINUTES PRIOR TO INTERCOURSE AS NEEDED (PLEASE SCHEDULE APPOINTMENT FOR FUTURE REFILLS) 15 tablet 0   No facility-administered medications prior to visit.    No Known Allergies  ROS Review of Systems Review of Systems:  A fourteen system review of systems was  performed and found to be positive as per HPI.   Objective:    Physical Exam General:  Pleasant and cooperative, in no acute distress  Neuro:  Alert and oriented,  extra-ocular muscles intact  HEENT:  Normocephalic, atraumatic, neck supple Skin:  no gross rash, warm, pink. Cardiac:  RRR, S1 S2 Respiratory: CTA B/L, Not using accessory muscles, speaking in full sentences- unlabored. Vascular:  Ext warm, no cyanosis apprec.; cap RF less 2 sec. No edema Psych:  No HI/SI, judgement and insight good, Euthymic mood. Full Affect.  BP 110/74   Pulse 80   Temp 98.5 F (36.9 C)   Ht '5\' 8"'  (1.727 m)   Wt 201 lb (91.2 kg)   SpO2 99%   BMI 30.56 kg/m  Wt Readings from Last 3 Encounters:  03/09/21 201 lb (91.2 kg)  02/20/21 205 lb 6.4 oz (93.2 kg)  07/22/20 221 lb 3.2 oz (100.3 kg)     Health Maintenance Due  Topic Date Due   COVID-19 Vaccine (1) Never done   Pneumococcal Vaccine 5-55 Years old (1 - PCV) Never done   TETANUS/TDAP  Never done   OPHTHALMOLOGY EXAM  01/29/2020   FOOT EXAM  02/05/2020   INFLUENZA VACCINE  Never done    There are no preventive care reminders to display for this patient.  Lab Results  Component Value Date   TSH 1.320 10/27/2018   Lab Results  Component Value Date   WBC 10.1 12/25/2019   HGB 16.3 12/25/2019   HCT 45.3 12/25/2019   MCV 90.6 12/25/2019   PLT 263 12/25/2019   Lab Results  Component Value Date   NA 138 02/20/2021   K 4.8 02/20/2021   CO2 24 02/20/2021   GLUCOSE 82 02/20/2021   BUN 15 02/20/2021   CREATININE 0.71 02/20/2021   BILITOT 1.1 02/20/2021   ALKPHOS 50 01/20/2019   AST 21 02/20/2021   ALT 25 02/20/2021   PROT 7.2 02/20/2021   ALBUMIN 4.4 01/20/2019   CALCIUM 10.1 02/20/2021   ANIONGAP 9 12/25/2019   EGFR 118 02/20/2021   Lab Results  Component Value Date   CHOL 201 (H) 02/20/2021   Lab Results  Component Value Date   HDL 33.60 (L) 02/20/2021   Lab Results  Component Value Date   LDLCALC Comment  10/27/2018   Lab Results  Component Value Date   TRIG 295.0 (H) 02/20/2021   Lab Results  Component Value Date   CHOLHDL 6 02/20/2021   Lab Results  Component Value Date   HGBA1C 5.5 02/20/2021      Assessment & Plan:   Problem List Items Addressed This Visit       Cardiovascular and Mediastinum   Hypertension associated with diabetes ([place])   Relevant Medications   sildenafil (VIAGRA) 100 MG tablet     Endocrine   Hyperlipidemia associated with type 2 diabetes mellitus ([facility]) (Chronic)   Relevant Medications   sildenafil (VIAGRA) 100 MG tablet   Diabetes mellitus ([place]) - Primary     Other   ED (erectile dysfunction) (Chronic)   Relevant Medications   sildenafil (VIAGRA) 100 MG tablet   Other Visit Diagnoses     Encounter for tobacco use cessation counseling       Class 1 obesity with serious comorbidity and body mass index (BMI) of 30.0 to 30.9 in adult, unspecified obesity type          Diabetes mellitus: -Last A1c improved from 8.3 to 5.5.  Continue to follow-up with endocrinology.  Continue current medication regimen and recommend to monitor for hypoglycemic events. -Encouraged to continue with dietary changes and monitoring carbohydrate and glucose intake.  Hypertension associated with diabetes: -Improved and controlled (BP<130/80).  Continue current medication regimen.  Recent CMP normal.  Encourage to continue weight loss efforts. -Will continue to monitor  Hyperlipidemia associated with type 2 diabetes mellitus: -Last lipid panel: Total cholesterol 201, triglycerides 295, HDL 33.60, VLDL 59.0 -Continue rosuvastatin 10 mg. -Encourage to continue weight loss efforts with dietary changes and follow a low-fat diet.  ED: -Stable. -Continue current medication regimen as needed. -Will continue to monitor.  Class I obesity with serious comorbidity and body mass index (BMI 30.0 to 30.9), unspecified obesity type: -Associated with type 2 diabetes mellitus,  hypertension and hyperlipidemia. -Praised patient for weight loss of 26 pounds with dietary and lifestyle changes.  Encounter for tobacco use cessation counseling: -The patient was counseled on the dangers of tobacco use, and was advised to quit.  Reviewed strategies to maximize success, including stress management and support of family/friends.  Encouraged to continue with reducing tobacco use and eventually quit.   Meds ordered this encounter  Medications   sildenafil (VIAGRA) 100 MG tablet    Sig: TAKE ONE-HALF TABLET BY MOUTH 30 MINUTES PRIOR TO INTERCOURSE AS NEEDED (PLEASE SCHEDULE APPOINTMENT FOR FUTURE REFILLS)    Dispense:  30 tablet    Refill:  0    Order Specific Question:   Supervising Provider    Answer:   Beatrice Lecher D [[id number]]    Follow-up: Return in about 6 months (around 09/06/2021) for HTN, DM, HLD .    Lorrene Reid, PA-C

## 2021-03-09 NOTE — Patient Instructions (Signed)

## 2021-03-28 ENCOUNTER — Telehealth: Payer: Self-pay | Admitting: Dermatology

## 2021-03-28 ENCOUNTER — Ambulatory Visit (INDEPENDENT_AMBULATORY_CARE_PROVIDER_SITE_OTHER): Payer: Medicare Other | Admitting: Dermatology

## 2021-03-28 ENCOUNTER — Other Ambulatory Visit: Payer: Self-pay

## 2021-03-28 DIAGNOSIS — L400 Psoriasis vulgaris: Secondary | ICD-10-CM | POA: Diagnosis not present

## 2021-03-28 MED ORDER — CLOBETASOL PROP EMOLLIENT BASE 0.05 % EX CREA: 1.0000 "application " | TOPICAL_CREAM | Freq: Every day | CUTANEOUS | 5 refills | Status: DC

## 2021-03-28 MED ORDER — SKYRIZI PEN 150 MG/ML ~~LOC~~ SOAJ: 150.0000 mg | SUBCUTANEOUS | 3 refills | Status: DC

## 2021-03-28 NOTE — Telephone Encounter (Signed)
Louis Carney left message on office voice mail that they were calling to get a refill on Risankizumab-rzaa (SKYRIZI PEN) 150 MG/ML SOAJ.  Their phone number is [phone] and fax number is [phone].

## 2021-03-28 NOTE — Telephone Encounter (Signed)
Patient needs ov no refill

## 2021-04-06 ENCOUNTER — Telehealth: Payer: Self-pay | Admitting: Dermatology

## 2021-04-06 NOTE — Telephone Encounter (Signed)
error 

## 2021-04-10 ENCOUNTER — Telehealth: Payer: Self-pay | Admitting: *Deleted

## 2021-04-10 NOTE — Telephone Encounter (Signed)
[facility] sent fax over-stating they have attempted to reach patient and left 7 voicemail's regarding skyrizi shipment.

## 2021-04-15 ENCOUNTER — Encounter: Payer: Self-pay | Admitting: Dermatology

## 2021-04-15 NOTE — Progress Notes (Signed)
° °  Follow-Up Visit   Subjective  Louis Carney is a 40 y.o. male who presents for the following: Follow-up (Follow up on skyrizi. Needs refills. ).  Psoriasis Location:  Duration:  Quality:  Associated Signs/Symptoms: Modifying Factors:  Severity:  Timing: Context:   Objective  Well appearing patient in no apparent distress; mood and affect are within normal limits. Achieving 95% clearing with [employer].  No objective joint inflammation.    All skin waist up examined.   Assessment & Plan    Plaque psoriasis  I again both reviewed the margin of this autoimmune disorder along with its association with joint symptoms in the adult metabolic disorder.  Strongly encouraged to have healthy lifestyle goals including cessation of smoking.  Sample given to patient in the office for skyrizi.   Risankizumab-rzaa (SKYRIZI PEN) 150 MG/ML SOAJ Inject 150 mg into the skin as directed. Every 12 weeks for maintenance.  Clobetasol Prop Emollient Base (CLOBETASOL PROPIONATE E) 0.05 % emollient cream Apply 1 application topically daily.      I, Lavonna Monarch, MD, have reviewed all documentation for this visit.  The documentation on 04/15/21 for the exam, diagnosis, procedures, and orders are all accurate and complete.

## 2021-04-28 ENCOUNTER — Other Ambulatory Visit: Payer: Self-pay | Admitting: Physician Assistant

## 2021-04-28 DIAGNOSIS — N529 Male erectile dysfunction, unspecified: Secondary | ICD-10-CM

## 2021-04-28 DIAGNOSIS — I152 Hypertension secondary to endocrine disorders: Secondary | ICD-10-CM

## 2021-06-26 ENCOUNTER — Telehealth: Payer: Self-pay

## 2021-06-26 NOTE — Telephone Encounter (Signed)
Fax received from [facility] stating that the patient has agreed to deliver of his Skyrizi for 06/28/21 with a copay of $10.35.

## 2021-07-11 ENCOUNTER — Other Ambulatory Visit: Payer: Self-pay | Admitting: Physician Assistant

## 2021-07-11 DIAGNOSIS — N529 Male erectile dysfunction, unspecified: Secondary | ICD-10-CM

## 2021-07-11 DIAGNOSIS — E1159 Type 2 diabetes mellitus with other circulatory complications: Secondary | ICD-10-CM

## 2021-08-24 ENCOUNTER — Ambulatory Visit: Payer: Medicare Other | Admitting: Internal Medicine

## 2021-09-04 ENCOUNTER — Ambulatory Visit (INDEPENDENT_AMBULATORY_CARE_PROVIDER_SITE_OTHER): Payer: Medicare Other | Admitting: Internal Medicine

## 2021-09-04 ENCOUNTER — Encounter: Payer: Self-pay | Admitting: Internal Medicine

## 2021-09-04 VITALS — BP 120/84 | HR 78 | Ht 68.0 in | Wt 208.4 lb

## 2021-09-04 DIAGNOSIS — E782 Mixed hyperlipidemia: Secondary | ICD-10-CM | POA: Diagnosis not present

## 2021-09-04 DIAGNOSIS — Z794 Long term (current) use of insulin: Secondary | ICD-10-CM

## 2021-09-04 DIAGNOSIS — E669 Obesity, unspecified: Secondary | ICD-10-CM

## 2021-09-04 DIAGNOSIS — E1165 Type 2 diabetes mellitus with hyperglycemia: Secondary | ICD-10-CM | POA: Diagnosis not present

## 2021-09-04 LAB — POCT GLYCOSYLATED HEMOGLOBIN (HGB A1C): Hemoglobin A1C: 5.5 % (ref 4.0–5.6)

## 2021-09-04 MED ORDER — OZEMPIC (1 MG/DOSE) 4 MG/3ML ~~LOC~~ SOPN: 1.0000 mg | PEN_INJECTOR | SUBCUTANEOUS | 3 refills | Status: DC

## 2021-09-04 NOTE — Patient Instructions (Addendum)
Please restart: ?- Ozempic 1 mg weekly ? ?Please taper down to off: ?- Lantus 16 units daily for 4 days, then decrease by 4 units every 4 days until you stop ? ?Please return in 6 months with your sugar log.  ? ?Plant-based diet materials: ?- Lectures (you tube): ?Alyssa Grove: "Breaking the Food Seduction" ?Doug Lisle: "How to Lose Weight, without Losing Your Mind" ?- Documentaries: ?Supersize Me ?[place]. ?Forks over [employer] ?Vegucated ?Fat, Sick and Nearly Dead ?The Weight of the Nation ?Overweight and undernourished ?The Game Changers ?- Books: ?Alyssa Grove: "Program for Reversing Diabetes" ?Narda Amber: The kick Diabetes cookbook" ?Alyssa Grove: "The [employer]" ?Legrand Como Greger: "How Not to Die" ?Heath Gold: "The [place] Study" ?Norma Fredrickson: "Supermarket Vegan" (cookbook) ?- Facebook pages:  ?Forks versus Knives ?Nutrition facts ?Vegucated ?VegNews Magazine ?Food Matters ?Chef AJ ? ? ?

## 2021-09-04 NOTE — Progress Notes (Signed)
Patient ID: Louis Carney, male   DOB: Aug 15, 1980, 41 y.o.   MRN: [id number]  ? ?This visit occurred during the SARS-CoV-2 public health emergency.  Safety protocols were in place, including screening questions prior to the visit, additional usage of staff PPE, and extensive cleaning of exam room while observing appropriate contact time as indicated for disinfecting solutions.  ? ?HPI: ?Louis Carney is a 41 y.o.-year-old male, initially referred by his PCP, Dr. Raliegh Scarlet, returning for follow-up for DM2, dx in 09/2018, insulin-dependent, uncontrolled, without long-term complications.  Last visit 7 months ago.  He is not usually compliant with appointments. ? ?Interim history: ?No increased urination, blurry vision, nausea, chest pain. Has back pain - on disability. ?He separated from his wife.  He is now only living with his 83 year old son. ?After his wife moved away, he cut down drinking and started to improve diet - cutting down carbs. Lost 20-25 lbs per his scale at home.  He feels much better and sleeps better.  He also started to go to the gym.  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 ?Since last OV, he ran out of Ozempic in 05/2021. ? ?Reviewed history: ?He had dizziness, blurry vision >> checked sugar with neighbor's meter >> CBG high (HI, then rechecked: 540). ? ?He saw PCP >> HbA1c was high x2 >> added insulin.  He felt much better after adding insulin.  However, sugars were still extremely high at last visit so we added rapid acting insulin and I made specific suggestions about his diet and alcohol intake. ? ?Reviewed HbA1c levels: ?Lab Results  ?Component Value Date  ? HGBA1C 5.5 02/20/2021  ? HGBA1C 8.3 (A) 07/22/2020  ? HGBA1C 6.4 (A) 01/18/2020  ? HGBA1C 6.2 (A) 08/21/2019  ? HGBA1C 6.0 (A) 01/20/2019  ? HGBA1C 9.9 (A) 10/27/2018  ? HGBA1C 10.2 (H) 10/27/2018  ? HGBA1C 5.6 03/06/2018  ? HGBA1C 5.3 08/26/2017  ? ?Currently on: ?- Ozempic 0.5 >> 1 mg weekly >>  no nausea >> ran out in 05/2021 ?- Lantus 25 units 2x a day >> 18 units 2x a day >> 22 units 2x a day >> 22 units 1x a day >> 15 units 1-2x a day ?- l  >> ran out 10/2020 ? ?Pt checks his sugars once a day: ?- am: ? ?107-140 >> 97-126 >> 126-164 >> 78-110 >> 88-120 ?- 2h after b'fast: n/c ?- before lunch: 300-340 >> <130 >> 140s >> 97-126 >> n/c ?- 2h after lunch: n/c ?- before dinner: 340 >> 128-132 >> n/c ?- 2h after dinner: n/c ?- bedtime: 106-126 >> 124-168  >> up to 110 >> 80s-123 ?- nighttime: n/c ?Lowest sugar was 97 >> 124  >> 78 >> 88; it is unclear at which CBG level he has hypoglycemia awareness. ?Highest sugar was 460 ... >> 202  >> 110 >> 123. ? ?Glucometer:ReliOn ? ?Pt's meals are: ?- Breakfast: skips ?- Lunch: [place] sandwich  ?- Dinner: lasagna, hamburger, steak + potatoes, pasta ?- Snacks:cookies, chips. ?He does not like greens. ?In the past, he was drinking 4-6 shots of vodka, 6 pack beer EVERY night >> then 5 Bud Light beers a night >> then liquor/vodka >> stopped. ? ?-No CKD.  Last BUN/creatinine:  ?Lab Results  ?Component Value Date  ? BUN 15 02/20/2021  ? BUN 13 12/25/2019  ? CREATININE 0.71 02/20/2021  ? CREATININE 0.61 12/25/2019  ?On olmesartan. ? ?-He had significant hypertriglyceridemia, which improved at last  check; last set of lipids: ?Lab Results  ?Component Value Date  ? CHOL 201 (H) 02/20/2021  ? HDL 33.60 (L) 02/20/2021  ? [place] Comment 10/27/2018  ? LDLDIRECT 143.0 02/20/2021  ? TRIG 295.0 (H) 02/20/2021  ? CHOLHDL 6 02/20/2021  ?  ?He has a history of transaminitis: ?Lab Results  ?Component Value Date  ? ALT 25 02/20/2021  ? AST 21 02/20/2021  ? ALKPHOS 50 01/20/2019  ? BILITOT 1.1 02/20/2021  ?He sees Dr. Carlean Purl with [facility].   ? ?- last eye exam was in 2021: No DR reportedly. ? ?-+ numbness and tingling in his R leg - from back pain - see Dr. Cyndy Freeze. ? ?Pt has FH of DM in father - type 2 DM. + significant alcoholism FH. ? ?He is on Risankizumab in 04/2018 for psoriasis >>  working. Prev. Embrel, Humira, etc. his psoriasis is much improved on meds. ?No history of pancreatitis or personal or family history of medullary thyroid cancer. ? ?ROS: ?+ See HPI ?Musculoskeletal: no muscle aches/+ joint aches - back pain ?Skin: + Rash on hands (psoriasis) >> much improved ? ?I reviewed pt's medications, allergies, PMH, social hx, family hx, and changes were documented in the history of present illness. Otherwise, unchanged from my initial visit note. ? ?Past Medical History:  ?Diagnosis Date  ? Arthritis   ? Diabetes ([place])   ? type 1  ? GERD (gastroesophageal reflux disease)   ? Headache   ? migraines  ? Hypertension   ? Psoriasis   ? since age 64 years  ? Tuberculosis   ? latent TB infection on treatment  ? ?Past Surgical History:  ?Procedure Laterality Date  ? CARPAL TUNNEL RELEASE Right 01/15/2018  ? Procedure: Right Carpal tunnel release;  Surgeon: Ashok Pall, MD;  Location: [place];  Service: Neurosurgery;  Laterality: Right;  Right Carpal tunnel release  ? CERVICAL SPINE SURGERY    ? LUMBAR LAMINECTOMY/DECOMPRESSION MICRODISCECTOMY Right 03/07/2018  ? Procedure: Right Lumbar 5 Sacral 1 Microdiscectomy;  Surgeon: Ashok Pall, MD;  Location: [facility];  Service: Neurosurgery;  Laterality: Right;  Right Lumbar 5 Sacral 1 Microdiscectomy  ? NECK SURGERY    ? ?Social History  ? ?Socioeconomic History  ? Marital status: Married  ?  Spouse name: Not on file  ? Number of children: 5  ? Years of education: Not on file  ? Highest education level: Not on file  ?Occupational History  ? Occupation: disabled  ?Tobacco Use  ? Smoking status: Every Day  ?  Packs/day: 1.00  ?  Years: 15.00  ?  Pack years: 15.00  ?  Types: Cigarettes  ? Smokeless tobacco: Never  ?Vaping Use  ? Vaping Use: Never used  ?Substance and Sexual Activity  ? Alcohol use: Yes  ?  Alcohol/week: 24.0 standard drinks  ?  Types: 24 Standard drinks or equivalent per week  ? Drug use: Never  ? Sexual activity: Yes  ?  Partners: Female  ?   Birth control/protection: None  ?Other Topics Concern  ? Not on file  ?Social History Narrative  ? Married, 3 biologic children 2 stepchildren   ? disabled due to back pain  ? 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 6-8 beers nightly  ? Smoker  ? No drug use  ? ?Social Determinants of Health  ? ?Financial Resource Strain: Not on file  ?Food Insecurity: Not on file  ?Transportation Needs: No Transportation Needs  ?  Lack of Transportation (Medical): No  ? Lack of Transportation (Non-Medical): No  ?Physical Activity: Inactive  ? Days of Exercise per Week: 0 days  ? Minutes of Exercise per Session: 0 min  ?Stress: Not on file  ?Social Connections: Not on file  ?Intimate Partner Violence: Not on file  ? ?Current Outpatient Medications on File Prior to Visit  ?Medication Sig Dispense Refill  ? amLODipine (NORVASC) 10 MG tablet Take 1 tablet by mouth once daily 90 tablet 0  ? blood glucose meter kit and supplies Dispense based on patient and insurance preference. Use to check glucose level three times daily before meals and 3 times daily 2 hours after meals (6 times daily total). (FOR ICD-10 E10.9, E11.9). 1 each 0  ? Clobetasol Prop Emollient Base (CLOBETASOL PROPIONATE E) 0.05 % emollient cream Apply 1 application topically daily. 60 g 5  ? esomeprazole (NEXIUM) 20 MG capsule Take 20 mg by mouth daily at 12 noon.    ? gabapentin (NEURONTIN) 300 MG capsule Take 2 capsules (600 mg total) by mouth 2 (two) times daily. (Patient taking differently: Take 600 mg by mouth daily. Patient states he is only taking 365m daily) 360 capsule 0  ? ibuprofen (ADVIL) 200 MG tablet Take 1,200 mg by mouth daily.     ? insulin glargine (LANTUS SOLOSTAR) 100 UNIT/ML Solostar Pen Inject 20 units into the skin twice daily-12 hours apart. 30 mL 3  ? metoprolol succinate (TOPROL-XL) 100 MG 24 hr tablet Take 1 tablet (100 mg total) by mouth daily. Take with or immediately following a meal.  90 tablet 1  ? olmesartan (BENICAR) 40 MG tablet Take 1 tablet by mouth once daily 90 tablet 0  ? OneTouch Delica Lancets 340XMISC USE TO TEST BLOOD SUGARS 6 TIMES DAILY (BEFORE MEALS AND 2 HOURS AFTER M

## 2021-09-05 NOTE — Patient Instructions (Signed)
Hypertension, Adult ?Hypertension is another name for high blood pressure. High blood pressure forces your heart to work harder to pump blood. This can cause problems over time. ?There are two numbers in a blood pressure reading. There is a top number (systolic) over a bottom number (diastolic). It is best to have a blood pressure that is below 120/80. ?What are the causes? ?The cause of this condition is not known. Some other conditions can lead to high blood pressure. ?What increases the risk? ?Some lifestyle factors can make you more likely to develop high blood pressure: ?Smoking. ?Not getting enough exercise or physical activity. ?Being overweight. ?Having too much fat, sugar, calories, or salt (sodium) in your diet. ?Drinking too much alcohol. ?Other risk factors include: ?Having any of these conditions: ?Heart disease. ?Diabetes. ?High cholesterol. ?Kidney disease. ?Obstructive sleep apnea. ?Having a family history of high blood pressure and high cholesterol. ?Age. The risk increases with age. ?Stress. ?What are the signs or symptoms? ?High blood pressure may not cause symptoms. Very high blood pressure (hypertensive crisis) may cause: ?Headache. ?Fast or uneven heartbeats (palpitations). ?Shortness of breath. ?Nosebleed. ?Vomiting or feeling like you may vomit (nauseous). ?Changes in how you see. ?Very bad chest pain. ?Feeling dizzy. ?Seizures. ?How is this treated? ?This condition is treated by making healthy lifestyle changes, such as: ?Eating healthy foods. ?Exercising more. ?Drinking less alcohol. ?Your doctor may prescribe medicine if lifestyle changes do not help enough and if: ?Your top number is above 130. ?Your bottom number is above 80. ?Your personal target blood pressure may vary. ?Follow these instructions at home: ?Eating and drinking ? ?If told, follow the DASH eating plan. To follow this plan: ?Fill one half of your plate at each meal with fruits and vegetables. ?Fill one fourth of your plate  at each meal with whole grains. Whole grains include whole-wheat pasta, brown rice, and whole-grain bread. ?Eat or drink low-fat dairy products, such as skim milk or low-fat yogurt. ?Fill one fourth of your plate at each meal with low-fat (lean) proteins. Low-fat proteins include fish, chicken without skin, eggs, beans, and tofu. ?Avoid fatty meat, cured and processed meat, or chicken with skin. ?Avoid pre-made or processed food. ?Limit the amount of salt in your diet to less than 1,500 mg each day. ?Do not drink alcohol if: ?Your doctor tells you not to drink. ?You are pregnant, may be pregnant, or are planning to become pregnant. ?If you drink alcohol: ?Limit how much you have to: ?0-1 drink a day for women. ?0-2 drinks a day for men. ?Know how much alcohol is in your drink. In the [place] one drink equals one 12 oz bottle of beer (355 mL), one 5 oz glass of wine (148 mL), or one 1? oz glass of hard liquor (44 mL). ?Lifestyle ? ?Work with your doctor to stay at a healthy weight or to lose weight. Ask your doctor what the best weight is for you. ?Get at least 30 minutes of exercise that causes your heart to beat faster (aerobic exercise) most days of the week. This may include walking, swimming, or biking. ?Get at least 30 minutes of exercise that strengthens your muscles (resistance exercise) at least 3 days a week. This may include lifting weights or doing Pilates. ?Do not smoke or use any products that contain nicotine or tobacco. If you need help quitting, ask your doctor. ?Check your blood pressure at home as told by your doctor. ?Keep all follow-up visits. ?Medicines ?Take over-the-counter and prescription medicines   only as told by your doctor. Follow directions carefully. ?Do not skip doses of blood pressure medicine. The medicine does not work as well if you skip doses. Skipping doses also puts you at risk for problems. ?Ask your doctor about side effects or reactions to medicines that you should watch  for. ?Contact a doctor if: ?You think you are having a reaction to the medicine you are taking. ?You have headaches that keep coming back. ?You feel dizzy. ?You have swelling in your ankles. ?You have trouble with your vision. ?Get help right away if: ?You get a very bad headache. ?You start to feel mixed up (confused). ?You feel weak or numb. ?You feel faint. ?You have very bad pain in your: ?Chest. ?Belly (abdomen). ?You vomit more than once. ?You have trouble breathing. ?These symptoms may be an emergency. Get help right away. Call 911. ?Do not wait to see if the symptoms will go away. ?Do not drive yourself to the hospital. ?Summary ?Hypertension is another name for high blood pressure. ?High blood pressure forces your heart to work harder to pump blood. ?For most people, a normal blood pressure is less than 120/80. ?Making healthy choices can help lower blood pressure. If your blood pressure does not get lower with healthy choices, you may need to take medicine. ?This information is not intended to replace advice given to you by your health care provider. Make sure you discuss any questions you have with your health care provider. ?Document Revised: 02/02/2021 Document Reviewed: 02/02/2021 ?Elsevier Patient Education ? 2023 Elsevier Inc. ? ?

## 2021-09-06 ENCOUNTER — Ambulatory Visit: Payer: Medicare Other | Admitting: Physician Assistant

## 2021-09-06 ENCOUNTER — Encounter: Payer: Self-pay | Admitting: Physician Assistant

## 2021-09-06 ENCOUNTER — Ambulatory Visit (INDEPENDENT_AMBULATORY_CARE_PROVIDER_SITE_OTHER): Payer: Medicare Other | Admitting: Physician Assistant

## 2021-09-06 VITALS — BP 136/85 | HR 81 | Temp 97.7°F | Ht 68.0 in | Wt 207.0 lb

## 2021-09-06 DIAGNOSIS — H109 Unspecified conjunctivitis: Secondary | ICD-10-CM | POA: Diagnosis not present

## 2021-09-06 DIAGNOSIS — I152 Hypertension secondary to endocrine disorders: Secondary | ICD-10-CM | POA: Diagnosis not present

## 2021-09-06 DIAGNOSIS — R062 Wheezing: Secondary | ICD-10-CM

## 2021-09-06 DIAGNOSIS — J018 Other acute sinusitis: Secondary | ICD-10-CM | POA: Diagnosis not present

## 2021-09-06 DIAGNOSIS — Z794 Long term (current) use of insulin: Secondary | ICD-10-CM

## 2021-09-06 DIAGNOSIS — E785 Hyperlipidemia, unspecified: Secondary | ICD-10-CM | POA: Diagnosis not present

## 2021-09-06 DIAGNOSIS — E1159 Type 2 diabetes mellitus with other circulatory complications: Secondary | ICD-10-CM

## 2021-09-06 DIAGNOSIS — E1169 Type 2 diabetes mellitus with other specified complication: Secondary | ICD-10-CM

## 2021-09-06 MED ORDER — DOXYCYCLINE HYCLATE 100 MG PO TABS: 100.0000 mg | ORAL_TABLET | Freq: Two times a day (BID) | ORAL | 0 refills | Status: AC

## 2021-09-06 MED ORDER — ALBUTEROL SULFATE HFA 108 (90 BASE) MCG/ACT IN AERS: 2.0000 | INHALATION_SPRAY | Freq: Four times a day (QID) | RESPIRATORY_TRACT | 2 refills | Status: DC | PRN

## 2021-09-06 MED ORDER — OFLOXACIN 0.3 % OP SOLN: 2.0000 [drp] | Freq: Four times a day (QID) | OPHTHALMIC | 0 refills | Status: AC

## 2021-09-06 MED ORDER — METOPROLOL SUCCINATE ER 100 MG PO TB24: 100.0000 mg | ORAL_TABLET | Freq: Every day | ORAL | 1 refills | Status: DC

## 2021-09-06 NOTE — Assessment & Plan Note (Signed)
-  Last LDL 61. ?-Will collect direct LDL and hepatic function. ?-Continue current medication regimen, see med list. ?-Will continue to monitor. ?

## 2021-09-06 NOTE — Progress Notes (Signed)
?Established patient visit ? ? ?Patient: Louis Carney   DOB: [birth year]   41 y.o. Male  MRN: [id number] ?Visit Date: 09/06/2021 ? ?Chief Complaint  ?Patient presents with  ? Hypertension  ? ?Subjective  ?  ?HPI  ?Patient presents for chronic f/up. Patient reports quit drinking completely since the beginning of the year. Patient c/o left eye redness, swelling, and drainage for about a month. Denies eye pain with movement. Has tried different eye drops and allergy medication. Also reports other symptoms with nasal congestion, sinus pressure, and headache. No fever, earache, chills or night sweats. Does report episodic wheezing which clears after coughing and some shortness of breath. Denies worsening cough from baseline.  ? ?Diabetes: Pt denies increased urination or thirst.Patient is followed by Endocrinology. Pt reports medication compliance. States is planning to start a plant based diet. Wants to work on losing weight too.  ? ?HTN: Pt denies chest pain, palpitations, dizziness or lower extremity swelling. Patient reports has been out of his metoprolol but has been taking his other medications.  ? ?HLD: Pt taking medication as directed without issues. Reports has been cooking at home more.  ? ? ? ? ?Medications: ?Outpatient Medications Prior to Visit  ?Medication Sig  ? amLODipine (NORVASC) 10 MG tablet Take 1 tablet by mouth once daily  ? blood glucose meter kit and supplies Dispense based on patient and insurance preference. Use to check glucose level three times daily before meals and 3 times daily 2 hours after meals (6 times daily total). (FOR ICD-10 E10.9, E11.9).  ? Clobetasol Prop Emollient Base (CLOBETASOL PROPIONATE E) 0.05 % emollient cream Apply 1 application topically daily.  ? esomeprazole (NEXIUM) 20 MG capsule Take 20 mg by mouth daily at 12 noon.  ? ibuprofen (ADVIL) 200 MG tablet Take 1,200 mg by mouth daily.   ? insulin glargine (LANTUS SOLOSTAR) 100 UNIT/ML Solostar Pen Inject 20 units into the  skin twice daily-12 hours apart.  ? olmesartan (BENICAR) 40 MG tablet Take 1 tablet by mouth once daily  ? OneTouch Delica Lancets 00Q MISC USE TO TEST BLOOD SUGARS 6 TIMES DAILY (BEFORE MEALS AND 2 HOURS AFTER MEALS)  ? ONETOUCH VERIO test strip USE TO TEST 6 TIMES DAILY (BEFORE MEALS AND 2 HOURS AFTER MEALS)  ? Risankizumab-rzaa (SKYRIZI PEN) 150 MG/ML SOAJ Inject 150 mg into the skin as directed. Every 12 weeks for maintenance.  ? rosuvastatin (CRESTOR) 10 MG tablet Take 1 tablet (10 mg total) by mouth daily.  ? Semaglutide, 1 MG/DOSE, (OZEMPIC, 1 MG/DOSE,) 4 MG/3ML SOPN Inject 1 mg into the skin once a week.  ? sildenafil (VIAGRA) 100 MG tablet TAKE 1/2 TABLET BY MOUTH 30 MINUTES PRIOR TO INTERCOURSE AS NEEDED  ? [DISCONTINUED] metoprolol succinate (TOPROL-XL) 100 MG 24 hr tablet Take 1 tablet (100 mg total) by mouth daily. Take with or immediately following a meal.  ? gabapentin (NEURONTIN) 300 MG capsule Take 2 capsules (600 mg total) by mouth 2 (two) times daily. (Patient not taking: Reported on 09/06/2021)  ? ?No facility-administered medications prior to visit.  ? ? ?Review of Systems ?Review of Systems:  ?A fourteen system review of systems was performed and found to be positive as per HPI. ? ? ?Last CBC ?Lab Results  ?Component Value Date  ? WBC 8.2 09/06/2021  ? HGB 15.9 09/06/2021  ? HCT 45.3 09/06/2021  ? MCV 89 09/06/2021  ? MCH 31.2 09/06/2021  ? RDW 12.1 09/06/2021  ? PLT 272 09/06/2021  ? ?Last metabolic  panel ?Lab Results  ?Component Value Date  ? GLUCOSE 97 09/06/2021  ? NA 140 09/06/2021  ? K 4.7 09/06/2021  ? CL 101 09/06/2021  ? CO2 22 09/06/2021  ? BUN 12 09/06/2021  ? CREATININE 0.73 (L) 09/06/2021  ? EGFR 116 09/06/2021  ? CALCIUM 9.9 09/06/2021  ? PHOS 4.1 10/27/2018  ? PROT 7.4 09/06/2021  ? ALBUMIN 4.8 09/06/2021  ? LABGLOB 2.6 09/06/2021  ? AGRATIO 1.8 09/06/2021  ? BILITOT 0.5 09/06/2021  ? ALKPHOS 49 09/06/2021  ? AST 20 09/06/2021  ? ALT 25 09/06/2021  ? ANIONGAP 9 12/25/2019  ? ?Last  lipids ?Lab Results  ?Component Value Date  ? CHOL 201 (H) 02/20/2021  ? HDL 33.60 (L) 02/20/2021  ? [place] Comment 10/27/2018  ? LDLDIRECT 124 (H) 09/06/2021  ? TRIG 295.0 (H) 02/20/2021  ? CHOLHDL 6 02/20/2021  ? ?Last hemoglobin A1c ?Lab Results  ?Component Value Date  ? HGBA1C 5.5 09/04/2021  ? ?Last thyroid functions ?Lab Results  ?Component Value Date  ? TSH 1.320 10/27/2018  ? ?Last vitamin D ?Lab Results  ?Component Value Date  ? VD25OH 13.1 (L) 07/28/2018  ? ?  Objective  ?  ?BP 136/85   Pulse 81   Temp 97.7 ?F (36.5 ?C)   Ht '5\' 8"'  (1.727 m)   Wt 207 lb (93.9 kg)   SpO2 99%   BMI 31.47 kg/m?  ?BP Readings from Last 3 Encounters:  ?09/06/21 136/85  ?09/04/21 120/84  ?03/09/21 110/74  ? ?Wt Readings from Last 3 Encounters:  ?09/06/21 207 lb (93.9 kg)  ?09/04/21 208 lb 6.4 oz (94.5 kg)  ?03/09/21 201 lb (91.2 kg)  ? ? ?Physical Exam  ?General:  Well Developed, well nourished, appropriate for stated age.  ?Neuro:  Alert and oriented,  extra-ocular muscles intact  ?HEENT:  Normocephalic, atraumatic, upper and lower eyelids of left eye are swollen, erythematous sclera of left eye, slight yellow crust noted, PERRL, sinus pressure- frontal and ethmoid, fluid behind TM's of both ears, pale nasal mucosa with boggy turbinates of left nostril, mild erythema of posterior oropharynx, neck supple, +anterior cervical adenopathy   ?Skin:  no gross rash, warm, pink. ?Cardiac:  RRR, S1 S2 ?Respiratory: Scattered rhonchi with expiratory wheezing, no crackles or rales.  ?Vascular:  Ext warm, no cyanosis apprec.; cap RF less 2 sec. ?Psych:  No HI/SI, judgement and insight good, Euthymic mood. Full Affect. ? ? ?Results for orders placed or performed in visit on 09/06/21  ?CBC w/Diff  ?Result Value Ref Range  ? WBC 8.2 3.4 - 10.8 x10E3/uL  ? RBC 5.10 4.14 - 5.80 x10E6/uL  ? Hemoglobin 15.9 13.0 - 17.7 g/dL  ? Hematocrit 45.3 37.5 - 51.0 %  ? MCV 89 79 - 97 fL  ? MCH 31.2 26.6 - 33.0 pg  ? MCHC 35.1 31.5 - 35.7 g/dL  ? RDW  12.1 11.6 - 15.4 %  ? Platelets 272 150 - 450 x10E3/uL  ? Neutrophils 54 Not Estab. %  ? Lymphs 32 Not Estab. %  ? Monocytes 9 Not Estab. %  ? Eos 4 Not Estab. %  ? Basos 1 Not Estab. %  ? Neutrophils Absolute 4.4 1.4 - 7.0 x10E3/uL  ? Lymphocytes Absolute 2.6 0.7 - 3.1 x10E3/uL  ? Monocytes Absolute 0.7 0.1 - 0.9 x10E3/uL  ? EOS (ABSOLUTE) 0.3 0.0 - 0.4 x10E3/uL  ? Basophils Absolute 0.1 0.0 - 0.2 x10E3/uL  ? Immature Granulocytes 0 Not Estab. %  ? Immature Grans (Abs)  0.0 0.0 - 0.1 x10E3/uL  ?Comp Met (CMET)  ?Result Value Ref Range  ? Glucose 97 70 - 99 mg/dL  ? BUN 12 6 - 24 mg/dL  ? Creatinine, Ser 0.73 (L) 0.76 - 1.27 mg/dL  ? eGFR 116 >59 mL/min/1.73  ? BUN/Creatinine Ratio 16 9 - 20  ? Sodium 140 134 - 144 mmol/L  ? Potassium 4.7 3.5 - 5.2 mmol/L  ? Chloride 101 96 - 106 mmol/L  ? CO2 22 20 - 29 mmol/L  ? Calcium 9.9 8.7 - 10.2 mg/dL  ? Total Protein 7.4 6.0 - 8.5 g/dL  ? Albumin 4.8 4.0 - 5.0 g/dL  ? Globulin, Total 2.6 1.5 - 4.5 g/dL  ? Albumin/Globulin Ratio 1.8 1.2 - 2.2  ? Bilirubin Total 0.5 0.0 - 1.2 mg/dL  ? Alkaline Phosphatase 49 44 - 121 IU/L  ? AST 20 0 - 40 IU/L  ? ALT 25 0 - 44 IU/L  ?Direct LDL  ?Result Value Ref Range  ? LDL Direct 124 (H) 0 - 99 mg/dL  ? ? Assessment & Plan  ?  ? ? ?Problem List Items Addressed This Visit   ? ?  ? Cardiovascular and Mediastinum  ? Hypertension associated with diabetes ([place])  ?  -Sub-optimal. BP goal is <130/80. Patient has been without beta blocker, provided refill. Will continue amlodipine 10 mg and olmesartan 40 mg. Praised for alcohol cessation. Will continue to monitor.  ? ?  ?  ? Relevant Medications  ? metoprolol succinate (TOPROL-XL) 100 MG 24 hr tablet  ? Other Relevant Orders  ? CBC w/Diff (Completed)  ? Comp Met (CMET) (Completed)  ?  ? Endocrine  ? Hyperlipidemia associated with type 2 diabetes mellitus (HCC) (Chronic)  ?  -Last LDL 61. ?-Will collect direct LDL and hepatic function. ?-Continue current medication regimen, see med list. ?-Will  continue to monitor. ? ?  ?  ? Relevant Medications  ? metoprolol succinate (TOPROL-XL) 100 MG 24 hr tablet  ? Other Relevant Orders  ? CBC w/Diff (Completed)  ? Comp Met (CMET) (Completed)  ? Direct LDL (Comple

## 2021-09-06 NOTE — Assessment & Plan Note (Signed)
-  Followed by Endocrinology. ?-Last A1c remained well controlled at 5.5 ?-Recommend to continue his medication regimen and ambulatory glucose monitoring.  ?-Will continue to monitor alongside cardiology. ?

## 2021-09-06 NOTE — Assessment & Plan Note (Signed)
-  Sub-optimal. BP goal is <130/80. Patient has been without beta blocker, provided refill. Will continue amlodipine 10 mg and olmesartan 40 mg. Praised for alcohol cessation. Will continue to monitor.  ?

## 2021-09-07 LAB — CBC WITH DIFFERENTIAL/PLATELET
Basophils Absolute: 0.1 10*3/uL (ref 0.0–0.2)
Basos: 1 %
EOS (ABSOLUTE): 0.3 10*3/uL (ref 0.0–0.4)
Eos: 4 %
Hematocrit: 45.3 % (ref 37.5–51.0)
Hemoglobin: 15.9 g/dL (ref 13.0–17.7)
Immature Grans (Abs): 0 10*3/uL (ref 0.0–0.1)
Immature Granulocytes: 0 %
Lymphocytes Absolute: 2.6 10*3/uL (ref 0.7–3.1)
Lymphs: 32 %
MCH: 31.2 pg (ref 26.6–33.0)
MCHC: 35.1 g/dL (ref 31.5–35.7)
MCV: 89 fL (ref 79–97)
Monocytes Absolute: 0.7 10*3/uL (ref 0.1–0.9)
Monocytes: 9 %
Neutrophils Absolute: 4.4 10*3/uL (ref 1.4–7.0)
Neutrophils: 54 %
Platelets: 272 10*3/uL (ref 150–450)
RBC: 5.1 x10E6/uL (ref 4.14–5.80)
RDW: 12.1 % (ref 11.6–15.4)
WBC: 8.2 10*3/uL (ref 3.4–10.8)

## 2021-09-07 LAB — COMPREHENSIVE METABOLIC PANEL
ALT: 25 IU/L (ref 0–44)
AST: 20 IU/L (ref 0–40)
Albumin/Globulin Ratio: 1.8 (ref 1.2–2.2)
Albumin: 4.8 g/dL (ref 4.0–5.0)
Alkaline Phosphatase: 49 IU/L (ref 44–121)
BUN/Creatinine Ratio: 16 (ref 9–20)
BUN: 12 mg/dL (ref 6–24)
Bilirubin Total: 0.5 mg/dL (ref 0.0–1.2)
CO2: 22 mmol/L (ref 20–29)
Calcium: 9.9 mg/dL (ref 8.7–10.2)
Chloride: 101 mmol/L (ref 96–106)
Creatinine, Ser: 0.73 mg/dL — ABNORMAL LOW (ref 0.76–1.27)
Globulin, Total: 2.6 g/dL (ref 1.5–4.5)
Glucose: 97 mg/dL (ref 70–99)
Potassium: 4.7 mmol/L (ref 3.5–5.2)
Sodium: 140 mmol/L (ref 134–144)
Total Protein: 7.4 g/dL (ref 6.0–8.5)
eGFR: 116 mL/min/{1.73_m2} (ref >59)

## 2021-09-07 LAB — LDL CHOLESTEROL, DIRECT: LDL Direct: 124 mg/dL — ABNORMAL HIGH (ref 0–99)

## 2021-09-12 ENCOUNTER — Other Ambulatory Visit: Payer: Self-pay | Admitting: Physician Assistant

## 2021-09-12 DIAGNOSIS — N529 Male erectile dysfunction, unspecified: Secondary | ICD-10-CM

## 2021-10-03 ENCOUNTER — Other Ambulatory Visit: Payer: Self-pay | Admitting: Physician Assistant

## 2021-10-03 DIAGNOSIS — N529 Male erectile dysfunction, unspecified: Secondary | ICD-10-CM

## 2021-10-08 ENCOUNTER — Other Ambulatory Visit: Payer: Self-pay | Admitting: Physician Assistant

## 2021-10-08 DIAGNOSIS — N529 Male erectile dysfunction, unspecified: Secondary | ICD-10-CM

## 2021-10-30 ENCOUNTER — Other Ambulatory Visit: Payer: Self-pay | Admitting: Physician Assistant

## 2021-10-30 DIAGNOSIS — N529 Male erectile dysfunction, unspecified: Secondary | ICD-10-CM

## 2021-11-28 ENCOUNTER — Telehealth: Payer: Self-pay | Admitting: *Deleted

## 2021-11-28 DIAGNOSIS — L400 Psoriasis vulgaris: Secondary | ICD-10-CM

## 2021-11-28 MED ORDER — SKYRIZI PEN 150 MG/ML ~~LOC~~ SOAJ: 150.0000 mg | SUBCUTANEOUS | 6 refills | Status: DC

## 2021-11-28 NOTE — Telephone Encounter (Signed)
Fax from [facility] needing refill on [facility]- sent to pharmacy.

## 2021-11-30 ENCOUNTER — Other Ambulatory Visit: Payer: Self-pay | Admitting: Physician Assistant

## 2021-11-30 DIAGNOSIS — I152 Hypertension secondary to endocrine disorders: Secondary | ICD-10-CM

## 2022-01-07 ENCOUNTER — Other Ambulatory Visit: Payer: Self-pay | Admitting: Physician Assistant

## 2022-01-07 DIAGNOSIS — N529 Male erectile dysfunction, unspecified: Secondary | ICD-10-CM

## 2022-01-16 ENCOUNTER — Encounter: Payer: Self-pay | Admitting: Physician Assistant

## 2022-01-16 ENCOUNTER — Ambulatory Visit (INDEPENDENT_AMBULATORY_CARE_PROVIDER_SITE_OTHER): Payer: Medicare Other | Admitting: Physician Assistant

## 2022-01-16 VITALS — BP 127/89 | HR 79 | Ht 68.0 in | Wt 207.0 lb

## 2022-01-16 DIAGNOSIS — Z794 Long term (current) use of insulin: Secondary | ICD-10-CM

## 2022-01-16 DIAGNOSIS — J329 Chronic sinusitis, unspecified: Secondary | ICD-10-CM

## 2022-01-16 DIAGNOSIS — M48062 Spinal stenosis, lumbar region with neurogenic claudication: Secondary | ICD-10-CM

## 2022-01-16 DIAGNOSIS — I152 Hypertension secondary to endocrine disorders: Secondary | ICD-10-CM | POA: Diagnosis not present

## 2022-01-16 DIAGNOSIS — E1169 Type 2 diabetes mellitus with other specified complication: Secondary | ICD-10-CM

## 2022-01-16 DIAGNOSIS — E1159 Type 2 diabetes mellitus with other circulatory complications: Secondary | ICD-10-CM

## 2022-01-16 DIAGNOSIS — L409 Psoriasis, unspecified: Secondary | ICD-10-CM | POA: Diagnosis not present

## 2022-01-16 DIAGNOSIS — E785 Hyperlipidemia, unspecified: Secondary | ICD-10-CM | POA: Diagnosis not present

## 2022-01-16 LAB — POCT GLYCOSYLATED HEMOGLOBIN (HGB A1C): Hemoglobin A1C: 5.2 % (ref 4.0–5.6)

## 2022-01-16 MED ORDER — GABAPENTIN 300 MG PO CAPS: 300.0000 mg | ORAL_CAPSULE | Freq: Two times a day (BID) | ORAL | 0 refills | Status: DC

## 2022-01-16 NOTE — Assessment & Plan Note (Signed)
-  Patient previously followed by [facility] and on [place] which has worked well for him. Will place new dermatology referral.

## 2022-01-16 NOTE — Progress Notes (Signed)
Established patient visit   Patient: Louis Carney   DOB: June 01, 1980   41 y.o. Male  MRN: [id number] Visit Date: 01/16/2022  Chief Complaint  Patient presents with   Follow-up    Patient presents today for follow up. Patient is feeling great. Sinus issues he stated cant shake. Needs new Dermatology his closed.    Subjective    HPI HPI     Follow-up    Additional comments: Patient presents today for follow up. Patient is feeling great. Sinus issues he stated cant shake. Needs new Dermatology his closed.       Last edited by Pennelope Bracken, CMA on 01/16/2022  9:45 AM.      Patient presents for chronic follow-up. Patient reports continues to have chronic sinus issues with nasal congestion and sinus pressure. Also reports continues to have watery left eye, the antibiotic drops did not help and has also tried allergy drops such as pataday which were ineffective. Patient reports not ready to have back surgery and would like to pursue other non-invasive procedures. Reports some days his back pain and tingling/numbness are significant and interfere with certain daily activities.   Diabetes mellitus: Pt denies increased urination or thirst. Pt reports medication compliance. No hypoglycemic events. Checking glucose at home. Sugars average 100. Continues to avoid liquor. Drinks about 4 beers per week.    HTN: Pt denies chest pain, palpitations, dizziness or lower extremity swelling. Taking medication as directed without side effects.   HLD: Pt taking medication as directed without issues. No changes with diet.    Medications: Outpatient Medications Prior to Visit  Medication Sig   albuterol (VENTOLIN HFA) 108 (90 Base) MCG/ACT inhaler Inhale 2 puffs into the lungs every 6 (six) hours as needed for wheezing or shortness of breath.   amLODipine (NORVASC) 10 MG tablet Take 1 tablet by mouth once daily   blood glucose meter kit and supplies Dispense based on patient and insurance preference.  Use to check glucose level three times daily before meals and 3 times daily 2 hours after meals (6 times daily total). (FOR ICD-10 E10.9, E11.9).   Clobetasol Prop Emollient Base (CLOBETASOL PROPIONATE E) 0.05 % emollient cream Apply 1 application topically daily.   esomeprazole (NEXIUM) 20 MG capsule Take 20 mg by mouth daily at 12 noon.   ibuprofen (ADVIL) 200 MG tablet Take 1,200 mg by mouth daily.    insulin glargine (LANTUS SOLOSTAR) 100 UNIT/ML Solostar Pen Inject 20 units into the skin twice daily-12 hours apart.   metoprolol succinate (TOPROL-XL) 100 MG 24 hr tablet Take 1 tablet (100 mg total) by mouth daily. Take with or immediately following a meal.   olmesartan (BENICAR) 40 MG tablet Take 1 tablet by mouth once daily   OneTouch Delica Lancets 58K MISC USE TO TEST BLOOD SUGARS 6 TIMES DAILY (BEFORE MEALS AND 2 HOURS AFTER MEALS)   ONETOUCH VERIO test strip USE TO TEST 6 TIMES DAILY (BEFORE MEALS AND 2 HOURS AFTER MEALS)   Risankizumab-rzaa (SKYRIZI PEN) 150 MG/ML SOAJ Inject 150 mg into the skin as directed. Every 12 weeks for maintenance.   rosuvastatin (CRESTOR) 10 MG tablet Take 1 tablet (10 mg total) by mouth daily.   Semaglutide, 1 MG/DOSE, (OZEMPIC, 1 MG/DOSE,) 4 MG/3ML SOPN Inject 1 mg into the skin once a week.   sildenafil (VIAGRA) 100 MG tablet TAKE 1/2 TABLET BY MOUTH 30 MINUTES PRIOR TO INTERCOURSE AS NEEDED   [DISCONTINUED] gabapentin (NEURONTIN) 300 MG capsule Take 2 capsules (600 mg  total) by mouth 2 (two) times daily.   No facility-administered medications prior to visit.    Review of Systems Review of Systems:  A fourteen system review of systems was performed and found to be positive as per HPI.  Last CBC Lab Results  Component Value Date   WBC 8.2 09/06/2021   HGB 15.9 09/06/2021   HCT 45.3 09/06/2021   MCV 89 09/06/2021   MCH 31.2 09/06/2021   RDW 12.1 09/06/2021   PLT 272 79/06/4095   Last metabolic panel Lab Results  Component Value Date   GLUCOSE  97 09/06/2021   NA 140 09/06/2021   K 4.7 09/06/2021   CL 101 09/06/2021   CO2 22 09/06/2021   BUN 12 09/06/2021   CREATININE 0.73 (L) 09/06/2021   EGFR 116 09/06/2021   CALCIUM 9.9 09/06/2021   PHOS 4.1 10/27/2018   PROT 7.4 09/06/2021   ALBUMIN 4.8 09/06/2021   LABGLOB 2.6 09/06/2021   AGRATIO 1.8 09/06/2021   BILITOT 0.5 09/06/2021   ALKPHOS 49 09/06/2021   AST 20 09/06/2021   ALT 25 09/06/2021   ANIONGAP 9 12/25/2019   Last lipids Lab Results  Component Value Date   CHOL 201 (H) 02/20/2021   HDL 33.60 (L) 02/20/2021   LDLCALC Comment 10/27/2018   LDLDIRECT 124 (H) 09/06/2021   TRIG 295.0 (H) 02/20/2021   CHOLHDL 6 02/20/2021   Last hemoglobin A1c Lab Results  Component Value Date   HGBA1C 5.2 01/16/2022   Last thyroid functions Lab Results  Component Value Date   TSH 1.320 10/27/2018   Last vitamin D Lab Results  Component Value Date   VD25OH 13.1 (L) 07/28/2018       Objective    BP 127/89   Pulse 79   Ht '5\' 8"'  (1.727 m)   Wt 207 lb (93.9 kg)   SpO2 99%   BMI 31.47 kg/m  BP Readings from Last 3 Encounters:  01/16/22 127/89  09/06/21 136/85  09/04/21 120/84   Wt Readings from Last 3 Encounters:  01/16/22 207 lb (93.9 kg)  09/06/21 207 lb (93.9 kg)  09/04/21 208 lb 6.4 oz (94.5 kg)    Physical Exam  General:  Pleasant and cooperative, in no acute distress, appropriate for stated age.  Neuro:  Alert and oriented,  extra-ocular muscles intact  HEENT:  Normocephalic, atraumatic, tenderness of frontal sinus, neck supple  Skin:  no gross rash, warm, pink. Cardiac:  RRR, S1 S2 Respiratory: CTA B/L  Vascular:  Ext warm, no cyanosis apprec.; cap RF less 2 sec. Psych:  No HI/SI, judgement and insight good, Euthymic mood. Full Affect.   Results for orders placed or performed in visit on 01/16/22  POCT glycosylated hemoglobin (Hb A1C)  Result Value Ref Range   Hemoglobin A1C 5.2 4.0 - 5.6 %   HbA1c POC (<> result, manual entry)     HbA1c, POC  (prediabetic range)     HbA1c, POC (controlled diabetic range)      Assessment & Plan      Problem List Items Addressed This Visit       Cardiovascular and Mediastinum   Hypertension associated with diabetes ([place])    -Overall stable. Continue current medication regimen, see med list. Encourage to continue with low alcohol use and eventually quit. Recommend repeating CMP at follow-up visit.        Endocrine   Hyperlipidemia associated with type 2 diabetes mellitus (HCC) (Chronic)    -Last direct LDL 124 (goal<70). Recommend to  continue rosuvastatin 10 mg and collecting a fasting lipid panel and hepatic function at follow-up visit. Recommend to follow a heart healthy diet low in fat.      Diabetes mellitus ([place]) - Primary    -A1c 5.2 today, well controlled. Recommend to follow-up with endocrinology as scheduled and discuss stopping Lantus. Encourage to continue with low alcohol use and eventually quit. Continue ambulatory glucose monitoring.      Relevant Orders   POCT glycosylated hemoglobin (Hb A1C) (Completed)     Musculoskeletal and Integument   Psoriasis-onset age 38 has needed Cosentyx in past has failed Enbrel and Humira (Chronic)    -Patient previously followed by [facility] and on [place] which has worked well for him. Will place new dermatology referral.      Relevant Orders   Ambulatory referral to Dermatology     Other   Spinal stenosis of lumbar region    -Provided refill of Gabapentin 300 mg BID. Will place referral to Dr. Posey Pronto [facility] per pt request.  -MRI Lumbar Spine 05/15/2018 IMPRESSION: 1. Interval right side surgery at L5-S1 with 11 mm right paracentral rim enhancing disc extrusion contributing to severe right lateral recess and increased multifactorial spinal stenosis compared to the 2019 MRI. Query right S1 radiculitis. Moderate right greater than left L5 foraminal stenosis appears not significantly  changed. 2. Increased epidural lipomatosis and associated spinal stenosis since 2019 at L3-L4 and L4-L5. 3. Smooth enhancement of the left L5 nerve roots in the lower cauda equina appears inflammatory but is of unclear significance.      Relevant Medications   gabapentin (NEURONTIN) 300 MG capsule   Other Relevant Orders   Ambulatory referral to Pain Clinic   Other Visit Diagnoses     Chronic sinusitis, unspecified location       Relevant Orders   Ambulatory referral to ENT      Chronic sinusitis: -Discussed nasal rinses/lavage and steamy showers. -Will place referral to ENT for further evaluation.  Return in about 4 months (around 05/18/2022) for DM, HTN, tobacco use.        Lorrene Reid, PA-C  [facility] at [facility] [phone] (phone) [phone] (fax)  [place]

## 2022-01-16 NOTE — Assessment & Plan Note (Signed)
>>  ASSESSMENT AND PLAN FOR PSORIASIS-ONSET AGE 41 HAS NEEDED COSENTYX IN PAST HAS FAILED ENBREL AND HUMIRA WRITTEN ON 01/16/2022  1:21 PM BY ABONZA, MARITZA, PA-C  -Patient previously followed by [facility] and on [place] which has worked well for him. Will place new dermatology referral.

## 2022-01-16 NOTE — Assessment & Plan Note (Signed)
-  Overall stable. Continue current medication regimen, see med list. Encourage to continue with low alcohol use and eventually quit. Recommend repeating CMP at follow-up visit.

## 2022-01-16 NOTE — Assessment & Plan Note (Signed)
-  Provided refill of Gabapentin 300 mg BID. Will place referral to Dr. Posey Pronto with [facility] in [facility] per pt request.  -MRI Lumbar Spine 05/15/2018 IMPRESSION: 1. Interval right side surgery at L5-S1 with 11 mm right paracentral rim enhancing disc extrusion contributing to severe right lateral recess and increased multifactorial spinal stenosis compared to the 2019 MRI. Query right S1 radiculitis. Moderate right greater than left L5 foraminal stenosis appears not significantly changed. 2. Increased epidural lipomatosis and associated spinal stenosis since 2019 at L3-L4 and L4-L5. 3. Smooth enhancement of the left L5 nerve roots in the lower cauda equina appears inflammatory but is of unclear significance.

## 2022-01-16 NOTE — Assessment & Plan Note (Signed)
-  Last direct LDL 124 (goal<70). Recommend to continue rosuvastatin 10 mg and collecting a fasting lipid panel and hepatic function at follow-up visit. Recommend to follow a heart healthy diet low in fat.

## 2022-01-16 NOTE — Patient Instructions (Signed)

## 2022-01-16 NOTE — Assessment & Plan Note (Addendum)
-  A1c 5.2 today, well controlled. Recommend to follow-up with endocrinology as scheduled and discuss stopping Lantus. Encourage to continue with low alcohol use and eventually quit. Continue ambulatory glucose monitoring.

## 2022-01-23 DIAGNOSIS — H10422 Simple chronic conjunctivitis, left eye: Secondary | ICD-10-CM | POA: Diagnosis not present

## 2022-01-23 DIAGNOSIS — J302 Other seasonal allergic rhinitis: Secondary | ICD-10-CM | POA: Diagnosis not present

## 2022-01-23 DIAGNOSIS — J320 Chronic maxillary sinusitis: Secondary | ICD-10-CM | POA: Diagnosis not present

## 2022-03-02 ENCOUNTER — Other Ambulatory Visit: Payer: Self-pay | Admitting: Nurse Practitioner

## 2022-03-02 DIAGNOSIS — N529 Male erectile dysfunction, unspecified: Secondary | ICD-10-CM

## 2022-03-08 ENCOUNTER — Ambulatory Visit (INDEPENDENT_AMBULATORY_CARE_PROVIDER_SITE_OTHER): Payer: Medicare Other | Admitting: Internal Medicine

## 2022-03-08 ENCOUNTER — Encounter: Payer: Self-pay | Admitting: Internal Medicine

## 2022-03-08 VITALS — BP 124/82 | HR 73 | Ht 68.0 in | Wt 207.8 lb

## 2022-03-08 DIAGNOSIS — E669 Obesity, unspecified: Secondary | ICD-10-CM

## 2022-03-08 DIAGNOSIS — E1165 Type 2 diabetes mellitus with hyperglycemia: Secondary | ICD-10-CM

## 2022-03-08 DIAGNOSIS — E782 Mixed hyperlipidemia: Secondary | ICD-10-CM | POA: Diagnosis not present

## 2022-03-08 DIAGNOSIS — Z794 Long term (current) use of insulin: Secondary | ICD-10-CM

## 2022-03-08 NOTE — Patient Instructions (Signed)
Please continue: - Ozempic 1 mg weekly  Please return in 6 months.

## 2022-03-08 NOTE — Progress Notes (Signed)
Patient ID: Louis Carney, male   DOB: 02-14-1981, 41 y.o.   MRN: [id number]   HPI: Louis Carney is a 41 y.o.-year-old male, initially referred by his PCP, Dr. Raliegh Scarlet, returning for follow-up for DM2, dx in 09/2018, insulin-dependent, uncontrolled, without long-term complications.  Last visit 6 months ago.  He is not usually compliant with appointments.  Interim history: No increased urination, blurry vision, nausea, chest pain. Has back pain - on disability.  He was referred to the pain clinic by PCP. He is driving back and forth to [place] where his wife lives.  They are trying to finalize the divorce.  Reviewed history: He had dizziness, blurry vision >> checked sugar with neighbor's meter >> CBG high (HI, then rechecked: 540).  He saw PCP >> HbA1c was high x2 >> added insulin.  He felt much better after adding insulin.  However, sugars were still extremely high at last visit so we added rapid acting insulin and I made specific suggestions about his diet and alcohol intake.  Reviewed HbA1c levels: Lab Results  Component Value Date   HGBA1C 5.2 01/16/2022   HGBA1C 5.5 09/04/2021   HGBA1C 5.5 02/20/2021   HGBA1C 8.3 (A) 07/22/2020   HGBA1C 6.4 (A) 01/18/2020   HGBA1C 6.2 (A) 08/21/2019   HGBA1C 6.0 (A) 01/20/2019   HGBA1C 9.9 (A) 10/27/2018   HGBA1C 10.2 (H) 10/27/2018   HGBA1C 5.6 03/06/2018   Currently on: - Ozempic 0.5 >> 1 mg weekly >> ran out in 05/2021 >> restarted - Lantus 25 units 2x a day >> .Marland KitchenMarland Kitchen 15 units 1-2x a day >> off for 6 mo He was previously on NovoLog but stopped around 10/2020 after he ran out.  Pt checks his sugars once a day: - am: 97-126 >> 126-164 >> 78-110 >> 88-120 >> 98-111 - 2h after b'fast: n/c - before lunch: 300-340 >> <130 >> 140s >> 97-126 >> n/c - 2h after lunch: n/c - before dinner: 340 >> 128-132 >> n/c - 2h after dinner: n/c - bedtime:  124-168  >> up to 110 >> 80s-123 >> 90s-110s - nighttime: n/c Lowest sugar was 78 >> 88 >> 98; it  is unclear at which CBG level he has hypoglycemia awareness. Highest sugar was 460 ...>> 123 >> 110s.  Glucometer:ReliOn  Pt's meals are: - Breakfast: skips - Lunch: [place] sandwich  - Dinner: lasagna, hamburger, steak + potatoes, pasta - Snacks:cookies, chips. He does not like greens. In the past, he was drinking 4-6 shots of vodka, 6 pack beer EVERY night >> then 5 Bud Light beers a night >> then liquor/vodka >> stopped.  -No CKD.  Last BUN/creatinine:  Lab Results  Component Value Date   BUN 12 09/06/2021   BUN 15 02/20/2021   CREATININE 0.73 (L) 09/06/2021   CREATININE 0.71 02/20/2021  On olmesartan.  -He had significant hypertriglyceridemia, which improved at last check; last set of lipids: Lab Results  Component Value Date   CHOL 201 (H) 02/20/2021   HDL 33.60 (L) 02/20/2021   LDLCALC Comment 10/27/2018   LDLDIRECT 124 (H) 09/06/2021   TRIG 295.0 (H) 02/20/2021   CHOLHDL 6 02/20/2021   On Crestor 10 mg daily.  He has a history of transaminitis: Lab Results  Component Value Date   ALT 25 09/06/2021   AST 20 09/06/2021   ALKPHOS 49 09/06/2021   BILITOT 0.5 09/06/2021  He sees Dr. Carlean Purl with [facility].    - last eye exam was in 2021: No DR reportedly.  -+  numbness and tingling in his R leg - from back pain - see Dr. Cyndy Freeze.  Pt has FH of DM in father - type 2 DM. + significant alcoholism FH.  He is on Risankizumab in 04/2018 for psoriasis >> working. Prev. Embrel, Humira, etc. his psoriasis is much improved on meds. No history of pancreatitis or personal or family history of medullary thyroid cancer.  ROS: + See HPI Musculoskeletal: no muscle aches/+ joint aches - back pain Skin: + Rash on hands (psoriasis) >> much improved  I reviewed pt's medications, allergies, PMH, social hx, family hx, and changes were documented in the history of present illness. Otherwise, unchanged from my initial visit note.  Past Medical History:  Diagnosis Date   Arthritis     Diabetes ([place])    type 1   GERD (gastroesophageal reflux disease)    Headache    migraines   Hypertension    Psoriasis    since age 57 years   Tuberculosis    latent TB infection on treatment   Past Surgical History:  Procedure Laterality Date   CARPAL TUNNEL RELEASE Right 01/15/2018   Procedure: Right Carpal tunnel release;  Surgeon: Ashok Pall, MD;  Location: [place] [facility] [facility];  Service: Neurosurgery;  Laterality: Right;  Right Carpal tunnel release   CERVICAL SPINE SURGERY     LUMBAR LAMINECTOMY/DECOMPRESSION MICRODISCECTOMY Right 03/07/2018   Procedure: Right Lumbar 5 Sacral 1 Microdiscectomy;  Surgeon: Ashok Pall, MD;  Location: [facility];  Service: Neurosurgery;  Laterality: Right;  Right Lumbar 5 Sacral 1 Microdiscectomy   NECK SURGERY     Social History   Socioeconomic History   Marital status: Married    Spouse name: Not on file   Number of children: 5   Years of education: Not on file   Highest education level: Not on file  Occupational History   Occupation: disabled  Tobacco Use   Smoking status: Every Day    Packs/day: 1.00    Years: 15.00    Total pack years: 15.00    Types: Cigarettes   Smokeless tobacco: Never  Vaping Use   Vaping Use: Never used  Substance and Sexual Activity   Alcohol use: Yes    Alcohol/week: 24.0 standard drinks of alcohol    Types: 24 Standard drinks or equivalent per week   Drug use: Never   Sexual activity: Yes    Partners: Female    Birth control/protection: None  Other Topics Concern   Not on file  Social History Narrative   Married, 3 biologic children 2 stepchildren    disabled due to back pain   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   6-8 beers nightly   Smoker   No drug use   Social Determinants of Radio broadcast assistant Strain: Not on file  Food Insecurity: Not on file  Transportation Needs: No Transportation Needs (01/24/2021)   PRAPARE - Armed forces logistics/support/administrative officer (Medical): No    Lack of Transportation (Non-Medical): No  Physical Activity: Inactive (01/24/2021)   Exercise Vital Sign    Days of Exercise per Week: 0 days    Minutes of Exercise per Session: 0 min  Stress: Not on file  Social Connections: Not on file  Intimate Partner Violence: Not on file   Current Outpatient Medications on File Prior to Visit  Medication Sig Dispense Refill   albuterol (VENTOLIN HFA) 108 (90 Base) MCG/ACT inhaler Inhale 2 puffs into  the lungs every 6 (six) hours as needed for wheezing or shortness of breath. 8 g 2   amLODipine (NORVASC) 10 MG tablet Take 1 tablet by mouth once daily 90 tablet 0   blood glucose meter kit and supplies Dispense based on patient and insurance preference. Use to check glucose level three times daily before meals and 3 times daily 2 hours after meals (6 times daily total). (FOR ICD-10 E10.9, E11.9). 1 each 0   Clobetasol Prop Emollient Base (CLOBETASOL PROPIONATE E) 0.05 % emollient cream Apply 1 application topically daily. 60 g 5   esomeprazole (NEXIUM) 20 MG capsule Take 20 mg by mouth daily at 12 noon.     gabapentin (NEURONTIN) 300 MG capsule Take 1 capsule (300 mg total) by mouth 2 (two) times daily. 180 capsule 0   ibuprofen (ADVIL) 200 MG tablet Take 1,200 mg by mouth daily.      insulin glargine (LANTUS SOLOSTAR) 100 UNIT/ML Solostar Pen Inject 20 units into the skin twice daily-12 hours apart. 30 mL 3   metoprolol succinate (TOPROL-XL) 100 MG 24 hr tablet Take 1 tablet (100 mg total) by mouth daily. Take with or immediately following a meal. 90 tablet 1   olmesartan (BENICAR) 40 MG tablet Take 1 tablet by mouth once daily 90 tablet 0   OneTouch Delica Lancets 73Z MISC USE TO TEST BLOOD SUGARS 6 TIMES DAILY (BEFORE MEALS AND 2 HOURS AFTER MEALS) 200 each 0   ONETOUCH VERIO test strip USE TO TEST 6 TIMES DAILY (BEFORE MEALS AND 2 HOURS AFTER MEALS) 200 each 0   Risankizumab-rzaa (SKYRIZI PEN) 150 MG/ML SOAJ  Inject 150 mg into the skin as directed. Every 12 weeks for maintenance. 1 mL 6   rosuvastatin (CRESTOR) 10 MG tablet Take 1 tablet (10 mg total) by mouth daily. 90 tablet 3   Semaglutide, 1 MG/DOSE, (OZEMPIC, 1 MG/DOSE,) 4 MG/3ML SOPN Inject 1 mg into the skin once a week. 9 mL 3   sildenafil (VIAGRA) 100 MG tablet TAKE ONE-HALF TABLET BY MOUTH 30 MINUTES PRIOR TO INTERCOURSE AS NEEDED 30 tablet 0   No current facility-administered medications on file prior to visit.   No Known Allergies Family History  Problem Relation Age of Onset   Alcohol abuse Mother    Hypertension Mother    Lung cancer Mother    Alcohol abuse Father    Hypertension Father    Colon cancer Father        dx in his late 49's   Esophageal cancer Neg Hx    Rectal cancer Neg Hx     PE: BP 124/82 (BP Location: Left Arm, Patient Position: Sitting, Cuff Size: Normal)   Pulse 73   Ht _0  (1.727 m)   Wt 207 lb 12.8 oz (94.3 kg)   SpO2 94%   BMI 31.60 kg/m  Wt Readings from Last 3 Encounters:  03/08/22 207 lb 12.8 oz (94.3 kg)  01/16/22 207 lb (93.9 kg)  09/06/21 207 lb (93.9 kg)   Constitutional: overweight, in NAD Eyes:  EOMI, no exophthalmos ENT: no neck masses, no cervical lymphadenopathy Cardiovascular: RRR, No MRG Respiratory: CTA B Musculoskeletal: no deformities Skin:no rashes Neurological: no tremor with outstretched hands  ASSESSMENT: 1. DM, insulin-dependent, without long-term complications -We ruled out antipancreatic autoimmunity and insulin deficiency  Component     Latest Ref Rng 01/20/2019  Glucose, Plasma     65 - 99 mg/dL 111 (H)   C-Peptide     0.80 - 3.85  ng/mL 2.43   Islet Cell Ab     Neg:<1:1  Negative   ZNT8 Antibodies     U/mL <15   Glutamic Acid Decarb Ab     <5 IU/mL <5     2. HTG  3.  Obesity class I  PLAN:  1. Patient with type 2 diabetes, with improved control after he started to improve his diet and especially after cutting out alcohol and starting to workout  at the gym.  At last visit, HbA1c was 5.5%, stable, excellent.  However, he had another HbA1c obtained 12/2021 and this was even better, at 5.2%. -At last visit, sugars were at goal and he continued to work on his diet.  He was interested in becoming a vegetarian and we discussed about different sources of information and recipes.  Since sugars were at goal, I advised him to decrease the Lantus dose and taper it down to off.  We previously checked him for insulin deficiency and pancreatic autoimmunity and the tests were negative.  I did advise him to restart Ozempic to help with blood sugar control after stopping insulin. -At today's visit, all sugars are at goal.  He tells me that he was able to stop Lantus after our last visit.  He continues on Ozempic without hyper or hypoglycemic spikes.  He has no GI symptoms from it.  We will continue this for now. -He tells me that he tried the plant-based diet but because he is on the road so much, he was not able to follow the diet but plans to return to eat when he settles down - I suggested to:  Patient Instructions  Please continue: - Ozempic 1 mg weekly  Please return in 6 months.   - advised to check sugars at different times of the day - 1x a day, rotating check times - advised for yearly eye exams >> he is UTD - return to clinic in 6 months  2. HTG -Reviewed latest lipid panel from 01/2021 and the LDL from 08/2021: All fractions abnormal: Lab Results  Component Value Date   CHOL 201 (H) 02/20/2021   HDL 33.60 (L) 02/20/2021   LDLCALC Comment 10/27/2018   LDLDIRECT 124 (H) 09/06/2021   TRIG 295.0 (H) 02/20/2021   CHOLHDL 6 02/20/2021  -On Crestor 10 mg daily without side effects  3.  Obesity class I -We discussed in the past about cutting out alcohol, starches, fatty foods and snacks.  He was also constantly eating between meals and I strongly advised him to try to eliminate snacks but snack on fruit if absolutely needed.  He cut out  alcohol last year. -We will continue Ozempic which should also help with weight loss -Before last visit, he gained 7 pounds, but  lost 27 pounds before the previous 2 visits. -At last visit I recommended a more plant-based diet and he tried it but was not able to follow weight due to being on the road frequently. -Weight approximately stable since last visit  Philemon Kingdom, MD PhD [facility]

## 2022-03-28 ENCOUNTER — Ambulatory Visit: Payer: Medicare Other | Admitting: Dermatology

## 2022-05-18 ENCOUNTER — Ambulatory Visit: Payer: Medicare Other | Admitting: Nurse Practitioner

## 2022-05-28 ENCOUNTER — Other Ambulatory Visit (HOSPITAL_COMMUNITY): Payer: Self-pay

## 2022-05-28 ENCOUNTER — Telehealth: Payer: Self-pay

## 2022-05-28 NOTE — Telephone Encounter (Signed)
Patient Advocate Encounter   Received notification from [facility] that prior authorization is required for Ozempic (1 MG/DOSE) '4MG'$ /3ML pen-injectors  Submitted: 05-28-2022 [facility]  Status is pending

## 2022-05-29 ENCOUNTER — Ambulatory Visit: Payer: Medicare Other | Admitting: Nurse Practitioner

## 2022-05-29 ENCOUNTER — Telehealth: Payer: Self-pay | Admitting: *Deleted

## 2022-05-29 NOTE — Progress Notes (Incomplete)
Established patient visit   Patient: Louis Carney   DOB: 1980-11-03   42 y.o. Male  MRN: [id number] Visit Date: 05/29/2022   No chief complaint on file.  Subjective    HPI  ***   Medications: Outpatient Medications Prior to Visit  Medication Sig  . albuterol (VENTOLIN HFA) 108 (90 Base) MCG/ACT inhaler Inhale 2 puffs into the lungs every 6 (six) hours as needed for wheezing or shortness of breath.  Marland Kitchen amLODipine (NORVASC) 10 MG tablet Take 1 tablet by mouth once daily  . blood glucose meter kit and supplies Dispense based on patient and insurance preference. Use to check glucose level three times daily before meals and 3 times daily 2 hours after meals (6 times daily total). (FOR ICD-10 E10.9, E11.9).  . Clobetasol Prop Emollient Base (CLOBETASOL PROPIONATE E) 0.05 % emollient cream Apply 1 application topically daily.  Marland Kitchen esomeprazole (NEXIUM) 20 MG capsule Take 20 mg by mouth daily at 12 noon.  . gabapentin (NEURONTIN) 300 MG capsule Take 1 capsule (300 mg total) by mouth 2 (two) times daily.  Marland Kitchen ibuprofen (ADVIL) 200 MG tablet Take 1,200 mg by mouth daily.   . insulin glargine (LANTUS SOLOSTAR) 100 UNIT/ML Solostar Pen Inject 20 units into the skin twice daily-12 hours apart.  . metoprolol succinate (TOPROL-XL) 100 MG 24 hr tablet Take 1 tablet (100 mg total) by mouth daily. Take with or immediately following a meal.  . olmesartan (BENICAR) 40 MG tablet Take 1 tablet by mouth once daily  . OneTouch Delica Lancets 42V MISC USE TO TEST BLOOD SUGARS 6 TIMES DAILY (BEFORE MEALS AND 2 HOURS AFTER MEALS)  . ONETOUCH VERIO test strip USE TO TEST 6 TIMES DAILY (BEFORE MEALS AND 2 HOURS AFTER MEALS)  . Risankizumab-rzaa (SKYRIZI PEN) 150 MG/ML SOAJ Inject 150 mg into the skin as directed. Every 12 weeks for maintenance.  . rosuvastatin (CRESTOR) 10 MG tablet Take 1 tablet (10 mg total) by mouth daily.  . Semaglutide, 1 MG/DOSE, (OZEMPIC, 1 MG/DOSE,) 4 MG/3ML SOPN Inject 1 mg into the skin once a  week.  . sildenafil (VIAGRA) 100 MG tablet TAKE ONE-HALF TABLET BY MOUTH 30 MINUTES PRIOR TO INTERCOURSE AS NEEDED   No facility-administered medications prior to visit.    Review of Systems  {Labs (Optional):23779}   Objective    There were no vitals filed for this visit. There is no height or weight on file to calculate BMI.  BP Readings from Last 3 Encounters:  03/08/22 124/82  01/16/22 127/89  09/06/21 136/85    Wt Readings from Last 3 Encounters:  03/08/22 207 lb 12.8 oz (94.3 kg)  01/16/22 207 lb (93.9 kg)  09/06/21 207 lb (93.9 kg)    Physical Exam  ***  No results found for any visits on 05/29/22.  Assessment & Plan     Problem List Items Addressed This Visit   None    No follow-ups on file.         Ronnell Freshwater, NP  [facility] at [facility] [phone] (phone) [phone] (fax)  [place]

## 2022-05-29 NOTE — Telephone Encounter (Signed)
LVM for pt to call office back to see about rescheduling his missed appointment on 05/29/22. Louis Carney, CMA

## 2022-05-29 NOTE — Telephone Encounter (Signed)
Patient Advocate Encounter  Prior Authorization for Ozempic (1 MG/DOSE) '4MG'$ /3ML pen-injectors has been approved through [employer]  .   Effective: 05-28-2022 to until further notice

## 2022-06-08 ENCOUNTER — Other Ambulatory Visit: Payer: Self-pay

## 2022-06-08 DIAGNOSIS — E1159 Type 2 diabetes mellitus with other circulatory complications: Secondary | ICD-10-CM

## 2022-06-08 MED ORDER — OLMESARTAN MEDOXOMIL 40 MG PO TABS: 40.0000 mg | ORAL_TABLET | Freq: Every day | ORAL | 0 refills | Status: DC

## 2022-06-08 NOTE — Telephone Encounter (Signed)
L.O.V: 01/16/22  N.O.V: 06/27/22  30 day supply of Olmesartan sent to [facility].

## 2022-06-11 ENCOUNTER — Other Ambulatory Visit: Payer: Self-pay

## 2022-06-11 NOTE — Telephone Encounter (Signed)
L.O.V: 01/16/22  N.O.V: 06/27/22  L.R.F:09/06/21 Doxycycline 14 tab 0 refill.  Refill denied. Will be discussed during next OV.

## 2022-06-19 ENCOUNTER — Ambulatory Visit
Admission: EM | Admit: 2022-06-19 | Discharge: 2022-06-19 | Disposition: A | Discharge status: Home / Self Care | Payer: Medicare Other | Attending: Emergency Medicine | Admitting: Emergency Medicine

## 2022-06-19 DIAGNOSIS — L0231 Cutaneous abscess of buttock: Secondary | ICD-10-CM

## 2022-06-19 MED ORDER — DOXYCYCLINE HYCLATE 100 MG PO CAPS: 100.0000 mg | ORAL_CAPSULE | Freq: Two times a day (BID) | ORAL | 0 refills | Status: AC

## 2022-06-19 NOTE — Discharge Instructions (Addendum)
Take the doxycycline as directed.    Keep your wound clean and dry.  Wash it gently twice a day with soap and water.    Follow up with your primary care provider or a general surgeon.

## 2022-06-19 NOTE — ED Triage Notes (Signed)
Patient to Urgent Care with complaints of  abscess on the right side of his buttocks. States it is golf-ball sized. 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   Reports area appeared one week ago.   No drainage.

## 2022-06-19 NOTE — ED Provider Notes (Signed)
Louis Carney    CSN: [id number] Arrival date & time: 06/19/22  0909      History   Chief Complaint Chief Complaint  Patient presents with   Abscess    HPI Louis Carney is a 42 y.o. male.  Patient presents with 1 week history of abscess on his right buttock.  No wound drainage, fever, chills, difficulty with bowel movements, or other symptoms.  No treatment at home.  He reports history of abscess in the same area 10 years ago.  He drives a tow truck.  His medical history includes diabetes, hypertension, morbid obesity, mood disorder secondary to chronic pain syndrome, current everyday smoker.   The history is provided by the patient and medical records.    Past Medical History:  Diagnosis Date   Arthritis    Diabetes ([place])    type 1   GERD (gastroesophageal reflux disease)    Headache    migraines   Hypertension    Psoriasis    since age 53 years   Tuberculosis    latent TB infection on treatment    Patient Active Problem List   Diagnosis Date Noted   Diabetes mellitus ([place]) 05/28/2019   Morbid obesity ([place]) [id number]   Alcoholic hepatitis without ascites 05/28/2019   Sprain of calcaneofibular ligament of ankle, initial encounter- L-  pt refused xrays of ankle 05/28/2019   Alcohol abuse with alcohol-induced disorder ([place]) 02/07/2019   Hepatitis 02/05/2019   Adjustment disorder with mixed anxiety and depressed mood 02/05/2019   Hepatocellular damage 11/07/2018   Elevated liver function tests 11/07/2018   Nausea 11/07/2018   Alcohol abuse 09/09/2018   Ulnar neuropathy 06/03/2018   HNP (herniated nucleus pulposus), lumbar 03/07/2018   Transaminitis 01/29/2018   Medication monitoring encounter 11/29/2017   TB lung, latent 11/06/2017   Spinal stenosis of lumbar region 10/01/2017   Cervical spondylosis with myelopathy 10/01/2017   Family history of diabetes mellitus in father 09/12/2017   High risk medication use 09/12/2017   Vitamin D deficiency  09/12/2017   Hyperlipidemia associated with type 2 diabetes mellitus ([place]) 09/12/2017   Low level of high density lipoprotein (HDL) 09/12/2017   Hypertriglyceridemia 09/12/2017   Psoriasis-onset age 76 has needed Cosentyx in past has failed Enbrel and Humira 08/26/2017   Chronic radicular low back pain- R sided 08/26/2017   History of excision of lamina of cervical vertebra for decompression of sp- C4-6inal cord 08/26/2017   Neuropathy due to medical condition (HCC)-C8-T1 nerve distribution bilateral upper extremity, 08/26/2017   RSD (reflex sympathetic dystrophy)- right entire leg painful to non-noxious stimuli 08/26/2017   Cigarette smoker- 3 ppd for 6 yrs, then 1ppd for 9 yrs- 27 pk yr hx 08/26/2017   Drinks beer- 6-8 beers/d on ave ( 10 Yrs or so) 08/26/2017   Hypertension associated with diabetes ([place]) 08/26/2017   NSAID long-term use-4 ibuprofen 3 times daily times at least 5 years 08/26/2017   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 08/26/2017   Chronic fatigue 08/26/2017   Mood disorder ([place])- secondary to chronic pain syndrome 08/26/2017   Insomnia 08/26/2017   Environmental and seasonal allergies 08/26/2017   ED (erectile dysfunction) 08/26/2017   Allergic conjunctivitis of both eyes and rhinitis 08/26/2017   Class 1 obesity 08/26/2017   Plaque psoriasis 08/26/2017    Past Surgical History:  Procedure Laterality Date   CARPAL TUNNEL RELEASE Right 01/15/2018   Procedure: Right Carpal tunnel release;  Surgeon: Ashok Pall, MD;  Location: [facility];  Service: Neurosurgery;  Laterality: Right;  Right Carpal tunnel release   CERVICAL SPINE SURGERY     LUMBAR LAMINECTOMY/DECOMPRESSION MICRODISCECTOMY Right 03/07/2018   Procedure: Right Lumbar 5 Sacral 1 Microdiscectomy;  Surgeon: Ashok Pall, MD;  Location: [facility];  Service: Neurosurgery;  Laterality: Right;  Right Lumbar 5 Sacral 1 Microdiscectomy   NECK SURGERY         Home Medications    Prior to Admission  medications   Medication Sig Start Date End Date Taking? Authorizing Provider  doxycycline (VIBRAMYCIN) 100 MG capsule Take 1 capsule (100 mg total) by mouth 2 (two) times daily for 7 days. 06/19/22 06/26/22 Yes Sharion Balloon, NP  albuterol (VENTOLIN HFA) 108 (90 Base) MCG/ACT inhaler Inhale 2 puffs into the lungs every 6 (six) hours as needed for wheezing or shortness of breath. 09/06/21   Lorrene Reid, PA-C  amLODipine (NORVASC) 10 MG tablet Take 1 tablet by mouth once daily 11/30/21   Abonza, Maritza, PA-C  blood glucose meter kit and supplies Dispense based on patient and insurance preference. Use to check glucose level three times daily before meals and 3 times daily 2 hours after meals (6 times daily total). (FOR ICD-10 E10.9, E11.9). 10/27/18   Opalski, Neoma Laming, DO  Clobetasol Prop Emollient Base (CLOBETASOL PROPIONATE E) 0.05 % emollient cream Apply 1 application topically daily. 03/28/21   Lavonna Monarch, MD  esomeprazole (NEXIUM) 20 MG capsule Take 20 mg by mouth daily at 12 noon.    [provider]  gabapentin (NEURONTIN) 300 MG capsule Take 1 capsule (300 mg total) by mouth 2 (two) times daily. 01/16/22   Lorrene Reid, PA-C  ibuprofen (ADVIL) 200 MG tablet Take 1,200 mg by mouth daily.     [provider]  insulin glargine (LANTUS SOLOSTAR) 100 UNIT/ML Solostar Pen Inject 20 units into the skin twice daily-12 hours apart. 02/20/21   Philemon Kingdom, MD  metoprolol succinate (TOPROL-XL) 100 MG 24 hr tablet Take 1 tablet (100 mg total) by mouth daily. Take with or immediately following a meal. 09/06/21   Abonza, Maritza, PA-C  olmesartan (BENICAR) 40 MG tablet Take 1 tablet (40 mg total) by mouth daily. 06/08/22   Ronnell Freshwater, NP  OneTouch Delica Lancets 99991111 MISC USE TO TEST BLOOD SUGARS 6 TIMES DAILY (BEFORE MEALS AND 2 HOURS AFTER MEALS) 07/28/19   Opalski, Neoma Laming, DO  ONETOUCH VERIO test strip USE TO TEST 6 TIMES DAILY (BEFORE MEALS AND 2 HOURS AFTER MEALS) 07/28/19    Opalski, Neoma Laming, DO  Risankizumab-rzaa (SKYRIZI PEN) 150 MG/ML SOAJ Inject 150 mg into the skin as directed. Every 12 weeks for maintenance. 11/28/21   Lavonna Monarch, MD  rosuvastatin (CRESTOR) 10 MG tablet Take 1 tablet (10 mg total) by mouth daily. 02/21/21   Philemon Kingdom, MD  Semaglutide, 1 MG/DOSE, (OZEMPIC, 1 MG/DOSE,) 4 MG/3ML SOPN Inject 1 mg into the skin once a week. 09/04/21   Philemon Kingdom, MD  sildenafil (VIAGRA) 100 MG tablet TAKE ONE-HALF TABLET BY MOUTH 30 MINUTES PRIOR TO INTERCOURSE AS NEEDED 03/02/22   Ronnell Freshwater, NP    Family History Family History  Problem Relation Age of Onset   Alcohol abuse Mother    Hypertension Mother    Lung cancer Mother    Alcohol abuse Father    Hypertension Father    Colon cancer Father        dx in his late 65's   Esophageal cancer Neg Hx    Rectal cancer Neg Hx  Social History Social History   Tobacco Use   Smoking status: Every Day    Packs/day: 1.00    Years: 15.00    Total pack years: 15.00    Types: Cigarettes   Smokeless tobacco: Never  Vaping Use   Vaping Use: Never used  Substance Use Topics   Alcohol use: Yes    Alcohol/week: 24.0 standard drinks of alcohol    Types: 24 Standard drinks or equivalent per week   Drug use: Never     Allergies   Patient has no known allergies.   Review of Systems Review of Systems  Constitutional:  Negative for chills and fever.  Gastrointestinal:  Negative for abdominal pain, constipation, diarrhea and vomiting.  Musculoskeletal:  Negative for gait problem.  Skin:  Positive for color change and wound.  Neurological:  Negative for weakness and numbness.  All other systems reviewed and are negative.    Physical Exam Triage Vital Signs ED Triage Vitals  Enc Vitals Group     BP 06/19/22 0934 129/89     Pulse Rate 06/19/22 0915 73     Resp 06/19/22 0915 18     Temp 06/19/22 0915 98.1 F (36.7 C)     Temp src --      SpO2 06/19/22 0915 97 %     Weight --       Height --      Head Circumference --      Peak Flow --      Pain Score 06/19/22 0931 0     Pain Loc --      Pain Edu? --      Excl. in [place]? --    No data found.  Updated Vital Signs BP 129/89   Pulse 73   Temp 98.1 F (36.7 C)   Resp 18   SpO2 97%   Visual Acuity Right Eye Distance:   Left Eye Distance:   Bilateral Distance:    Right Eye Near:   Left Eye Near:    Bilateral Near:     Physical Exam Vitals and nursing note reviewed.  Constitutional:      General: He is not in acute distress.    Appearance: Normal appearance. He is well-developed. He is not ill-appearing.  HENT:     Mouth/Throat:     Mouth: Mucous membranes are moist.  Cardiovascular:     Rate and Rhythm: Normal rate and regular rhythm.  Pulmonary:     Effort: Pulmonary effort is normal. No respiratory distress.  Musculoskeletal:     Cervical back: Neck supple.  Skin:    General: Skin is warm and dry.     Capillary Refill: Capillary refill takes less than 2 seconds.     Findings: Erythema and lesion present.     Comments: Right buttock: Tennis ball size area of tender, fluctuant induration with localized erythema; no drainage or open wound.   Neurological:     General: No focal deficit present.     Mental Status: He is alert and oriented to person, place, and time.      UC Treatments / Results  Labs (all labs ordered are listed, but only abnormal results are displayed) Labs Reviewed - No data to display  EKG   Radiology No results found.  Procedures Incision and Drainage  Date/Time: 06/19/2022 10:09 AM  Performed by: Sharion Balloon, NP Authorized by: Sharion Balloon, NP   Consent:    Consent obtained:  Verbal   Consent given  by:  Patient   Risks discussed:  Bleeding, incomplete drainage, infection and pain Universal protocol:    Procedure explained and questions answered to patient or proxy's satisfaction: yes   Location:    Type:  Abscess   Location: right  buttock. Pre-procedure details:    Skin preparation:  Povidone-iodine Anesthesia:    Anesthesia method:  Local infiltration   Local anesthetic:  Lidocaine 1% w/o epi Procedure type:    Complexity:  Simple Procedure details:    Incision types:  Single straight   Drainage:  Purulent and bloody   Drainage amount:  Copious   Wound treatment:  Wound left open   Packing materials:  None Post-procedure details:    Procedure completion:  Tolerated well, no immediate complications  (including critical care time)  Medications Ordered in UC Medications - No data to display  Initial Impression / Assessment and Plan / UC Course  I have reviewed the triage vital signs and the nursing notes.  Pertinent labs & imaging results that were available during my care of the patient were reviewed by me and considered in my medical decision making (see chart for details).    Abscess of right buttock.  I&D performed.  Treating with doxycycline.  Wound care instructions and signs of worsening infection discussed.  ED precautions given.  Instructed patient to follow up with his PCP or a general surgeon.  He agrees to plan of care.    Final Clinical Impressions(s) / UC Diagnoses   Final diagnoses:  Abscess of buttock, right     Discharge Instructions      Take the doxycycline as directed.    Keep your wound clean and dry.  Wash it gently twice a day with soap and water.    Follow up with your primary care provider or a general surgeon.            ED Prescriptions     Medication Sig Dispense Auth. Provider   doxycycline (VIBRAMYCIN) 100 MG capsule Take 1 capsule (100 mg total) by mouth 2 (two) times daily for 7 days. 14 capsule Sharion Balloon, NP      PDMP not reviewed this encounter.   Sharion Balloon, NP 06/19/22 1013

## 2022-06-27 ENCOUNTER — Ambulatory Visit (INDEPENDENT_AMBULATORY_CARE_PROVIDER_SITE_OTHER): Payer: Medicare Other | Admitting: Nurse Practitioner

## 2022-06-27 ENCOUNTER — Encounter: Payer: Self-pay | Admitting: Nurse Practitioner

## 2022-06-27 VITALS — BP 112/72 | HR 79 | Ht 68.0 in | Wt 210.8 lb

## 2022-06-27 DIAGNOSIS — E1169 Type 2 diabetes mellitus with other specified complication: Secondary | ICD-10-CM

## 2022-06-27 DIAGNOSIS — I152 Hypertension secondary to endocrine disorders: Secondary | ICD-10-CM | POA: Diagnosis not present

## 2022-06-27 DIAGNOSIS — E1159 Type 2 diabetes mellitus with other circulatory complications: Secondary | ICD-10-CM | POA: Diagnosis not present

## 2022-06-27 DIAGNOSIS — E781 Pure hyperglyceridemia: Secondary | ICD-10-CM

## 2022-06-27 DIAGNOSIS — L400 Psoriasis vulgaris: Secondary | ICD-10-CM | POA: Diagnosis not present

## 2022-06-27 DIAGNOSIS — Z794 Long term (current) use of insulin: Secondary | ICD-10-CM | POA: Diagnosis not present

## 2022-06-27 DIAGNOSIS — E785 Hyperlipidemia, unspecified: Secondary | ICD-10-CM | POA: Diagnosis not present

## 2022-06-27 DIAGNOSIS — N529 Male erectile dysfunction, unspecified: Secondary | ICD-10-CM | POA: Diagnosis not present

## 2022-06-27 DIAGNOSIS — E782 Mixed hyperlipidemia: Secondary | ICD-10-CM

## 2022-06-27 LAB — POCT GLYCOSYLATED HEMOGLOBIN (HGB A1C): HbA1c POC (<> result, manual entry): 5.6 % (ref 4.0–5.6)

## 2022-06-27 MED ORDER — ROSUVASTATIN CALCIUM 10 MG PO TABS: 10.0000 mg | ORAL_TABLET | Freq: Every day | ORAL | 3 refills | Status: DC

## 2022-06-27 MED ORDER — METOPROLOL SUCCINATE ER 100 MG PO TB24: 100.0000 mg | ORAL_TABLET | Freq: Every day | ORAL | 1 refills | Status: DC

## 2022-06-27 MED ORDER — AMLODIPINE BESYLATE 10 MG PO TABS: 10.0000 mg | ORAL_TABLET | Freq: Every day | ORAL | 3 refills | Status: DC

## 2022-06-27 MED ORDER — OLMESARTAN MEDOXOMIL 40 MG PO TABS: 40.0000 mg | ORAL_TABLET | Freq: Every day | ORAL | 1 refills | Status: DC

## 2022-06-27 MED ORDER — SILDENAFIL CITRATE 100 MG PO TABS: ORAL_TABLET | ORAL | 3 refills | Status: DC

## 2022-06-27 NOTE — Progress Notes (Signed)
Established patient visit   Patient: Louis Carney   DOB: August 22, 1980   42 y.o. Male  MRN: [id number] Visit Date: 06/27/2022   Chief Complaint  Patient presents with   Medical Management of Chronic Issues   Subjective    HPI  Follow up -dm2  --HgbA1c is 5.6 today -not taking any diabetic medication and has not been on any for several months.  -does see endocrinology  -due for medicare wellness visit  -no current concerns or complaints today.  - he denies chest pain, chest pressure, or shortness of breath. He denies headaches or visual disturbances. He denies abdominal pain, nausea, vomiting, or changes in bowel or bladder habits.     Medications: Outpatient Medications Prior to Visit  Medication Sig   albuterol (VENTOLIN HFA) 108 (90 Base) MCG/ACT inhaler Inhale 2 puffs into the lungs every 6 (six) hours as needed for wheezing or shortness of breath.   blood glucose meter kit and supplies Dispense based on patient and insurance preference. Use to check glucose level three times daily before meals and 3 times daily 2 hours after meals (6 times daily total). (FOR ICD-10 E10.9, E11.9).   Clobetasol Prop Emollient Base (CLOBETASOL PROPIONATE E) 0.05 % emollient cream Apply 1 application topically daily.   esomeprazole (NEXIUM) 20 MG capsule Take 20 mg by mouth daily at 12 noon.   ibuprofen (ADVIL) 200 MG tablet Take 1,200 mg by mouth daily.    OneTouch Delica Lancets 99991111 MISC USE TO TEST BLOOD SUGARS 6 TIMES DAILY (BEFORE MEALS AND 2 HOURS AFTER MEALS)   ONETOUCH VERIO test strip USE TO TEST 6 TIMES DAILY (BEFORE MEALS AND 2 HOURS AFTER MEALS)   Risankizumab-rzaa (SKYRIZI PEN) 150 MG/ML SOAJ Inject 150 mg into the skin as directed. Every 12 weeks for maintenance.   Semaglutide, 1 MG/DOSE, (OZEMPIC, 1 MG/DOSE,) 4 MG/3ML SOPN Inject 1 mg into the skin once a week.   [DISCONTINUED] amLODipine (NORVASC) 10 MG tablet Take 1 tablet by mouth once daily   [DISCONTINUED] gabapentin (NEURONTIN)  300 MG capsule Take 1 capsule (300 mg total) by mouth 2 (two) times daily.   [DISCONTINUED] insulin glargine (LANTUS SOLOSTAR) 100 UNIT/ML Solostar Pen Inject 20 units into the skin twice daily-12 hours apart.   [DISCONTINUED] metoprolol succinate (TOPROL-XL) 100 MG 24 hr tablet Take 1 tablet (100 mg total) by mouth daily. Take with or immediately following a meal.   [DISCONTINUED] olmesartan (BENICAR) 40 MG tablet Take 1 tablet (40 mg total) by mouth daily.   [DISCONTINUED] rosuvastatin (CRESTOR) 10 MG tablet Take 1 tablet (10 mg total) by mouth daily.   [DISCONTINUED] sildenafil (VIAGRA) 100 MG tablet TAKE ONE-HALF TABLET BY MOUTH 30 MINUTES PRIOR TO INTERCOURSE AS NEEDED   No facility-administered medications prior to visit.    Review of Systems See HPI    Last CBC Lab Results  Component Value Date   WBC 8.2 09/06/2021   HGB 15.9 09/06/2021   HCT 45.3 09/06/2021   MCV 89 09/06/2021   MCH 31.2 09/06/2021   RDW 12.1 09/06/2021   PLT 272 0000000   Last metabolic panel Lab Results  Component Value Date   GLUCOSE 97 09/06/2021   NA 140 09/06/2021   K 4.7 09/06/2021   CL 101 09/06/2021   CO2 22 09/06/2021   BUN 12 09/06/2021   CREATININE 0.73 (L) 09/06/2021   EGFR 116 09/06/2021   CALCIUM 9.9 09/06/2021   PHOS 4.1 10/27/2018   PROT 7.4 09/06/2021   ALBUMIN 4.8 09/06/2021  LABGLOB 2.6 09/06/2021   AGRATIO 1.8 09/06/2021   BILITOT 0.5 09/06/2021   ALKPHOS 49 09/06/2021   AST 20 09/06/2021   ALT 25 09/06/2021   ANIONGAP 9 12/25/2019   Last lipids Lab Results  Component Value Date   CHOL 201 (H) 02/20/2021   HDL 33.60 (L) 02/20/2021   LDLCALC Comment 10/27/2018   LDLDIRECT 124 (H) 09/06/2021   TRIG 295.0 (H) 02/20/2021   CHOLHDL 6 02/20/2021   Last hemoglobin A1c Lab Results  Component Value Date   HGBA1C 5.6 06/27/2022   Last thyroid functions Lab Results  Component Value Date   TSH 1.320 10/27/2018   Last vitamin D Lab Results  Component Value Date    VD25OH 13.1 (L) 07/28/2018       Objective     Today's Vitals   06/27/22 1342 06/27/22 1400  BP: 132/84 112/72  Pulse: 79   SpO2: 90%   Weight: 210 lb 12.8 oz (95.6 kg)   Height: 5\' 8"  (1.727 m)    Body mass index is 32.05 kg/m.  BP Readings from Last 3 Encounters:  06/27/22 112/72  06/19/22 129/89  03/08/22 124/82    Wt Readings from Last 3 Encounters:  06/27/22 210 lb 12.8 oz (95.6 kg)  03/08/22 207 lb 12.8 oz (94.3 kg)  01/16/22 207 lb (93.9 kg)    Physical Exam Vitals and nursing note reviewed.  Constitutional:      Appearance: Normal appearance. He is well-developed.  HENT:     Head: Normocephalic and atraumatic.     Nose: Nose normal.     Mouth/Throat:     Mouth: Mucous membranes are moist.     Pharynx: Oropharynx is clear.  Eyes:     Extraocular Movements: Extraocular movements intact.     Conjunctiva/sclera: Conjunctivae normal.     Pupils: Pupils are equal, round, and reactive to light.  Neck:     Vascular: No carotid bruit.  Cardiovascular:     Rate and Rhythm: Normal rate and regular rhythm.     Pulses: Normal pulses.     Heart sounds: Normal heart sounds.  Pulmonary:     Effort: Pulmonary effort is normal.     Breath sounds: Normal breath sounds.  Abdominal:     Palpations: Abdomen is soft.  Musculoskeletal:        General: Normal range of motion.     Cervical back: Normal range of motion and neck supple.  Lymphadenopathy:     Cervical: No cervical adenopathy.  Skin:    General: Skin is warm and dry.     Capillary Refill: Capillary refill takes less than 2 seconds.     Comments: Widely disperesed plaques of psoriasis noted    Neurological:     General: No focal deficit present.     Mental Status: He is alert and oriented to person, place, and time.  Psychiatric:        Mood and Affect: Mood normal.        Behavior: Behavior normal.        Thought Content: Thought content normal.        Judgment: Judgment normal.      Results for  orders placed or performed in visit on 06/27/22  POCT glycosylated hemoglobin (Hb A1C)  Result Value Ref Range   Hemoglobin A1C     HbA1c POC (<> result, manual entry) 5.6 4.0 - 5.6 %   HbA1c, POC (prediabetic range)     HbA1c, POC (controlled diabetic range)  Assessment & Plan    Type 2 diabetes mellitus with other specified complication, with long-term current use of insulin (HCC) Assessment & Plan: HgbA1c 5.6 today. Currently diet controlled. Continue to see endocrinology as scheduled.   Orders: -     POCT glycosylated hemoglobin (Hb A1C)  Hypertension associated with diabetes ([place]) Assessment & Plan: Stable.  Continue current medication.   Orders: -     POCT glycosylated hemoglobin (Hb A1C) -     amLODIPine Besylate; Take 1 tablet (10 mg total) by mouth daily.  Dispense: 90 tablet; Refill: 3 -     Metoprolol Succinate ER; Take 1 tablet (100 mg total) by mouth daily. Take with or immediately following a meal.  Dispense: 90 tablet; Refill: 1 -     Olmesartan Medoxomil; Take 1 tablet (40 mg total) by mouth daily.  Dispense: 90 tablet; Refill: 1  Hyperlipidemia associated with type 2 diabetes mellitus ([place]) Assessment & Plan: Stable. Continue lipid lowering medications  as prescribed   Orders: -     POCT glycosylated hemoglobin (Hb A1C)  Plaque psoriasis Assessment & Plan: Refer to dermatology for further evaluation and treatment.    Orders: -     Ambulatory referral to Dermatology  Erectile dysfunction, unspecified erectile dysfunction type Assessment & Plan: May take viagra as needed and as prescribed   Orders: -     Sildenafil Citrate; TAKE ONE-HALF TABLET BY MOUTH 30 MINUTES PRIOR TO INTERCOURSE AS NEEDED  Dispense: 30 tablet; Refill: 3  Hypertriglyceridemia Assessment & Plan: Stable. Continue current medication.   Orders: -     Rosuvastatin Calcium; Take 1 tablet (10 mg total) by mouth daily.  Dispense: 90 tablet; Refill: 3    Problem List Items  Addressed This Visit       Cardiovascular and Mediastinum   Hypertension associated with diabetes ([place])    Stable.  Continue current medication.       Relevant Medications   amLODipine (NORVASC) 10 MG tablet   metoprolol succinate (TOPROL-XL) 100 MG 24 hr tablet   olmesartan (BENICAR) 40 MG tablet   rosuvastatin (CRESTOR) 10 MG tablet   sildenafil (VIAGRA) 100 MG tablet   Other Relevant Orders   POCT glycosylated hemoglobin (Hb A1C) (Completed)     Endocrine   Hyperlipidemia associated with type 2 diabetes mellitus (HCC) (Chronic)    Stable. Continue lipid lowering medications  as prescribed       Relevant Medications   amLODipine (NORVASC) 10 MG tablet   metoprolol succinate (TOPROL-XL) 100 MG 24 hr tablet   olmesartan (BENICAR) 40 MG tablet   rosuvastatin (CRESTOR) 10 MG tablet   sildenafil (VIAGRA) 100 MG tablet   Other Relevant Orders   POCT glycosylated hemoglobin (Hb A1C) (Completed)   Diabetes mellitus (HCC) - Primary    HgbA1c 5.6 today. Currently diet controlled. Continue to see endocrinology as scheduled.       Relevant Medications   olmesartan (BENICAR) 40 MG tablet   rosuvastatin (CRESTOR) 10 MG tablet   Other Relevant Orders   POCT glycosylated hemoglobin (Hb A1C) (Completed)     Musculoskeletal and Integument   Plaque psoriasis    Refer to dermatology for further evaluation and treatment.        Relevant Orders   Ambulatory referral to Dermatology     Other   ED (erectile dysfunction) (Chronic)    May take viagra as needed and as prescribed       Relevant Medications   sildenafil (VIAGRA) 100 MG tablet  Hypertriglyceridemia    Stable. Continue current medication.       Relevant Medications   amLODipine (NORVASC) 10 MG tablet   metoprolol succinate (TOPROL-XL) 100 MG 24 hr tablet   olmesartan (BENICAR) 40 MG tablet   rosuvastatin (CRESTOR) 10 MG tablet   sildenafil (VIAGRA) 100 MG tablet     Return in about 3 months (around 09/25/2022)  for diabetes with HgbA1c check, needs MWV at next available place .         Ronnell Freshwater, NP  [facility] at [facility] [phone] (phone) [phone] (fax)  [place]

## 2022-07-05 DIAGNOSIS — L400 Psoriasis vulgaris: Secondary | ICD-10-CM | POA: Diagnosis not present

## 2022-07-22 NOTE — Assessment & Plan Note (Signed)
Refer to dermatology for further evaluation and treatment.

## 2022-07-22 NOTE — Assessment & Plan Note (Signed)
Stable. Continue lipid lowering medications  as prescribed

## 2022-07-22 NOTE — Assessment & Plan Note (Signed)
Stable.  Continue current medication

## 2022-07-22 NOTE — Assessment & Plan Note (Signed)
May take viagra as needed and as prescribed

## 2022-07-22 NOTE — Assessment & Plan Note (Signed)
HgbA1c 5.6 today. Currently diet controlled. Continue to see endocrinology as scheduled.

## 2022-08-20 ENCOUNTER — Telehealth: Payer: Self-pay

## 2022-08-20 NOTE — Telephone Encounter (Signed)
Called patient to schedule Medicare Annual Wellness Visit (AWV). Left message for patient to call back and schedule Medicare Annual Wellness Visit (AWV).  Last date of AWV: 01/24/21  Please schedule an appointment at any time on Annual Wellness Visit Schedule.

## 2022-08-23 ENCOUNTER — Ambulatory Visit (INDEPENDENT_AMBULATORY_CARE_PROVIDER_SITE_OTHER): Payer: Medicare Other

## 2022-08-23 VITALS — Ht 68.0 in | Wt 210.0 lb

## 2022-08-23 DIAGNOSIS — Z0000 Encounter for general adult medical examination without abnormal findings: Secondary | ICD-10-CM | POA: Diagnosis not present

## 2022-08-23 NOTE — Patient Instructions (Addendum)
Louis Carney , Thank you for taking time to come for your Medicare Wellness Visit. I appreciate your ongoing commitment to your health goals. Please review the following plan we discussed and let me know if I can assist you in the future.   These are the goals we discussed:  Goals       No current goals (pt-stated)        This is a list of the screening recommended for you and due dates:  Health Maintenance  Topic Date Due   DTaP/Tdap/Td vaccine (1 - Tdap) Never done   Complete foot exam   02/05/2020   Yearly kidney function blood test for diabetes  09/07/2022   Eye exam for diabetics  08/23/2022*   Yearly kidney health urinalysis for diabetes  08/24/2022*   COVID-19 Vaccine (1) 09/08/2022*   Flu Shot  11/29/2022   Hemoglobin A1C  12/26/2022   Medicare Annual Wellness Visit  08/23/2023   Hepatitis C Screening: USPSTF Recommendation to screen - Ages 18-79 yo.  Completed   HIV Screening  Completed   HPV Vaccine  Aged Out  *Topic was postponed. The date shown is not the original due date.    Advanced directives: Advance directive discussed with you today. Even though you declined this today, please call our office should you change your mind, and we can give you the proper paperwork for you to fill out.   Conditions/risks identified: None  Next appointment: Follow up in one year for your annual wellness visit    Preventive Care 40-64 Years, Male Preventive care refers to lifestyle choices and visits with your health care provider that can promote health and wellness. What does preventive care include? A yearly physical exam. This is also called an annual well check. Dental exams once or twice a year. Routine eye exams. Ask your health care provider how often you should have your eyes checked. Personal lifestyle choices, including: Daily care of your teeth and gums. Regular physical activity. Eating a healthy diet. Avoiding tobacco and drug use. Limiting alcohol  use. Practicing safe sex. Taking low-dose aspirin every day starting at age 51. What happens during an annual well check? The services and screenings done by your health care provider during your annual well check will depend on your age, overall health, lifestyle risk factors, and family history of disease. Counseling  Your health care provider may ask you questions about your: Alcohol use. Tobacco use. Drug use. Emotional well-being. Home and relationship well-being. Sexual activity. Eating habits. Work and work Astron[employer]. Screening  You may have the following tests or measurements: Height, weight, and BMI. Blood pressure. Lipid and cholesterol levels. These may be checked every 5 years, or more frequently if you are over 46 years old. Skin check. Lung cancer screening. You may have this screening every year starting at age 65 if you have a 30-pack-year history of smoking and currently smoke or have quit within the past 15 years. Fecal occult blood test (FOBT) of the stool. You may have this test every year starting at age 7. Flexible sigmoidoscopy or colonoscopy. You may have a sigmoidoscopy every 5 years or a colonoscopy every 10 years starting at age 38. Prostate cancer screening. Recommendations will vary depending on your family history and other risks. Hepatitis C blood test. Hepatitis B blood test. Sexually transmitted disease (STD) testing. Diabetes screening. This is done by checking your blood sugar (glucose) after you have not eaten for a while (fasting). You may have this done  every 1-3 years. Discuss your test results, treatment options, and if necessary, the need for more tests with your health care provider. Vaccines  Your health care provider may recommend certain vaccines, such as: Influenza vaccine. This is recommended every year. Tetanus, diphtheria, and acellular pertussis (Tdap, Td) vaccine. You may need a Td booster every 10 years. Zoster vaccine. You may  need this after age 24. Pneumococcal 13-valent conjugate (PCV13) vaccine. You may need this if you have certain conditions and have not been vaccinated. Pneumococcal polysaccharide (PPSV23) vaccine. You may need one or two doses if you smoke cigarettes or if you have certain conditions. Talk to your health care provider about which screenings and vaccines you need and how often you need them. This information is not intended to replace advice given to you by your health care provider. Make sure you discuss any questions you have with your health care provider. Document Released: 05/13/2015 Document Revised: 01/04/2016 Document Reviewed: 02/15/2015 Elsevier Interactive Patient Education  2017 [employer].  Fall Prevention in the Home Falls can cause injuries. They can happen to people of all ages. There are many things you can do to make your home safe and to help prevent falls. What can I do on the outside of my home? Regularly fix the edges of walkways and driveways and fix any cracks. Remove anything that might make you trip as you walk through a door, such as a raised step or threshold. Trim any bushes or trees on the path to your home. Use bright outdoor lighting. Clear any walking paths of anything that might make someone trip, such as rocks or tools. Regularly check to see if handrails are loose or broken. Make sure that both sides of any steps have handrails. Any raised decks and porches should have guardrails on the edges. Have any leaves, snow, or ice cleared regularly. Use sand or salt on walking paths during winter. Clean up any spills in your garage right away. This includes oil or grease spills. What can I do in the bathroom? Use night lights. Install grab bars by the toilet and in the tub and shower. Do not use towel bars as grab bars. Use non-skid mats or decals in the tub or shower. If you need to sit down in the shower, use a plastic, non-slip stool. Keep the floor dry.  Clean up any water that spills on the floor as soon as it happens. Remove soap buildup in the tub or shower regularly. Attach bath mats securely with double-sided non-slip rug tape. Do not have throw rugs and other things on the floor that can make you trip. What can I do in the bedroom? Use night lights. Make sure that you have a light by your bed that is easy to reach. Do not use any sheets or blankets that are too big for your bed. They should not hang down onto the floor. Have a firm chair that has side arms. You can use this for support while you get dressed. Do not have throw rugs and other things on the floor that can make you trip. What can I do in the kitchen? Clean up any spills right away. Avoid walking on wet floors. Keep items that you use a lot in easy-to-reach places. If you need to reach something above you, use a strong step stool that has a grab bar. Keep electrical cords out of the way. Do not use floor polish or wax that makes floors slippery. If you must use  wax, use non-skid floor wax. Do not have throw rugs and other things on the floor that can make you trip. What can I do with my stairs? Do not leave any items on the stairs. Make sure that there are handrails on both sides of the stairs and use them. Fix handrails that are broken or loose. Make sure that handrails are as long as the stairways. Check any carpeting to make sure that it is firmly attached to the stairs. Fix any carpet that is loose or worn. Avoid having throw rugs at the top or bottom of the stairs. If you do have throw rugs, attach them to the floor with carpet tape. Make sure that you have a light switch at the top of the stairs and the bottom of the stairs. If you do not have them, ask someone to add them for you. What else can I do to help prevent falls? Wear shoes that: Do not have high heels. Have rubber bottoms. Are comfortable and fit you well. Are closed at the toe. Do not wear sandals. If  you use a stepladder: Make sure that it is fully opened. Do not climb a closed stepladder. Make sure that both sides of the stepladder are locked into place. Ask someone to hold it for you, if possible. Clearly mark and make sure that you can see: Any grab bars or handrails. First and last steps. Where the edge of each step is. Use tools that help you move around (mobility aids) if they are needed. These include: Canes. Walkers. Scooters. Crutches. Turn on the lights when you go into a dark area. Replace any light bulbs as soon as they burn out. Set up your furniture so you have a clear path. Avoid moving your furniture around. If any of your floors are uneven, fix them. If there are any pets around you, be aware of where they are. Review your medicines with your doctor. Some medicines can make you feel dizzy. This can increase your chance of falling. Ask your doctor what other things that you can do to help prevent falls. This information is not intended to replace advice given to you by your health care provider. Make sure you discuss any questions you have with your health care provider. Document Released: 02/10/2009 Document Revised: 09/22/2015 Document Reviewed: 05/21/2014 Elsevier Interactive Patient Education  2017 [employer].

## 2022-08-23 NOTE — Progress Notes (Signed)
Subjective:   Louis Carney is a 42 y.o. male who presents for Medicare Annual/Subsequent preventive examination.  Review of Systems    Virtual Visit via Telephone Note  I connected with  Louis Carney on 08/23/22 at  9:30 AM EDT by telephone and verified that I am speaking with the correct person using two identifiers.  Location: Patient: Home Provider: Office Persons participating in the virtual visit: patient/Nurse Health Advisor   I discussed the limitations, risks, security and privacy concerns of performing an evaluation and management service by telephone and the availability of in person appointments. The patient expressed understanding and agreed to proceed.  Interactive audio and video telecommunications were attempted between this nurse and patient, however failed, due to patient having technical difficulties OR patient did not have access to video capability.  We continued and completed visit with audio only.  Some vital signs may be absent or patient reported.   Tillie Rung, LPN  Cardiac Risk Factors include: advanced age (>6men, >62 women);diabetes mellitus;male gender;hypertension;smoking/ tobacco exposure     Objective:    Today's Vitals   08/23/22 0929  Weight: 210 lb (95.3 kg)  Height:  (1.727 m)   Body mass index is 31.93 kg/m.     08/23/2022    9:43 AM 01/24/2021   10:03 PM 12/25/2019   10:00 AM 01/15/2019    5:45 PM 03/07/2018    4:05 PM 02/27/2018   10:51 AM 01/07/2018   11:27 AM  Advanced Directives  Does Patient Have a Medical Advance Directive? No Yes No No No No No  Type of Furniture conservator/restorer;Living will       Does patient want to make changes to medical advance directive?  No - Patient declined       Copy of Healthcare Power of Attorney in Chart?  No - copy requested       Would patient like information on creating a medical advance directive? No - Patient declined   No - Patient declined No - Patient  declined No - Patient declined No - Patient declined    Current Medications (verified) Outpatient Encounter Medications as of 08/23/2022  Medication Sig   albuterol (VENTOLIN HFA) 108 (90 Base) MCG/ACT inhaler Inhale 2 puffs into the lungs every 6 (six) hours as needed for wheezing or shortness of breath.   amLODipine (NORVASC) 10 MG tablet Take 1 tablet (10 mg total) by mouth daily.   blood glucose meter kit and supplies Dispense based on patient and insurance preference. Use to check glucose level three times daily before meals and 3 times daily 2 hours after meals (6 times daily total). (FOR ICD-10 E10.9, E11.9).   Clobetasol Prop Emollient Base (CLOBETASOL PROPIONATE E) 0.05 % emollient cream Apply 1 application topically daily.   esomeprazole (NEXIUM) 20 MG capsule Take 20 mg by mouth daily at 12 noon.   ibuprofen (ADVIL) 200 MG tablet Take 1,200 mg by mouth daily.    metoprolol succinate (TOPROL-XL) 100 MG 24 hr tablet Take 1 tablet (100 mg total) by mouth daily. Take with or immediately following a meal.   olmesartan (BENICAR) 40 MG tablet Take 1 tablet (40 mg total) by mouth daily.   OneTouch Delica Lancets 33G MISC USE TO TEST BLOOD SUGARS 6 TIMES DAILY (BEFORE MEALS AND 2 HOURS AFTER MEALS)   ONETOUCH VERIO test strip USE TO TEST 6 TIMES DAILY (BEFORE MEALS AND 2 HOURS AFTER MEALS)   Risankizumab-rzaa (SKYRIZI PEN) 150 MG/ML  SOAJ Inject 150 mg into the skin as directed. Every 12 weeks for maintenance.   rosuvastatin (CRESTOR) 10 MG tablet Take 1 tablet (10 mg total) by mouth daily.   Semaglutide, 1 MG/DOSE, (OZEMPIC, 1 MG/DOSE,) 4 MG/3ML SOPN Inject 1 mg into the skin once a week.   sildenafil (VIAGRA) 100 MG tablet TAKE ONE-HALF TABLET BY MOUTH 30 MINUTES PRIOR TO INTERCOURSE AS NEEDED   No facility-administered encounter medications on file as of 08/23/2022.    Allergies (verified) Patient has no known allergies.   History: Past Medical History:  Diagnosis Date   Arthritis     Diabetes    type 1   GERD (gastroesophageal reflux disease)    Headache    migraines   Hypertension    Psoriasis    since age 42 years   Tuberculosis    latent TB infection on treatment   Past Surgical History:  Procedure Laterality Date   CARPAL TUNNEL RELEASE Right 01/15/2018   Procedure: Right Carpal tunnel release;  Surgeon: Coletta Memos, MD;  Location: [facility] OR;  Service: Neurosurgery;  Laterality: Right;  Right Carpal tunnel release   CERVICAL SPINE SURGERY     LUMBAR LAMINECTOMY/DECOMPRESSION MICRODISCECTOMY Right 03/07/2018   Procedure: Right Lumbar 5 Sacral 1 Microdiscectomy;  Surgeon: Coletta Memos, MD;  Location: [facility] OR;  Service: Neurosurgery;  Laterality: Right;  Right Lumbar 5 Sacral 1 Microdiscectomy   NECK SURGERY     Family History  Problem Relation Age of Onset   Alcohol abuse Mother    Hypertension Mother    Lung cancer Mother    Alcohol abuse Father    Hypertension Father    Colon cancer Father        dx in his late 60's   Esophageal cancer Neg Hx    Rectal cancer Neg Hx    Social History   Socioeconomic History   Marital status: Married    Spouse name: Not on file   Number of children: 5   Years of education: Not on file   Highest education level: Not on file  Occupational History   Occupation: disabled  Tobacco Use   Smoking status: Every Day    Packs/day: 1.00    Years: 15.00    Additional pack years: 0.00    Total pack years: 15.00    Types: Cigarettes   Smokeless tobacco: Never  Vaping Use   Vaping Use: Never used  Substance and Sexual Activity   Alcohol use: Yes    Alcohol/week: 24.0 standard drinks of alcohol    Types: 24 Standard drinks or equivalent per week   Drug use: Never   Sexual activity: Yes    Partners: Female    Birth control/protection: None  Other Topics Concern   Not on file  Social History Narrative   Married, 3 biologic children 2 stepchildren    disabled due to back pain   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   6-8 beers nightly   Smoker   No drug use   Social Determinants of Corporate investment banker Strain: Low Risk  (08/23/2022)   Overall Financial Resource Strain (CARDIA)    Difficulty of Paying Living Expenses: Not hard at all  Food Insecurity: No Food Insecurity (08/23/2022)   Hunger Vital Sign    Worried About Running Out of Food in the Last Year: Never true    Ran Out of Food in the Last Year: Never true  Transportation Needs: No Transportation Needs (08/23/2022)   PRAPARE - Administrator, Civil Service (Medical): No    Lack of Transportation (Non-Medical): No  Physical Activity: Inactive (08/23/2022)   Exercise Vital Sign    Days of Exercise per Week: 0 days    Minutes of Exercise per Session: 0 min  Stress: No Stress Concern Present (08/23/2022)   [employer] of Occupational Health - Occupational Stress Questionnaire    Feeling of Stress : Not at all  Social Connections: Socially Isolated (08/23/2022)   Social Connection and Isolation Panel [NHANES]    Frequency of Communication with Friends and Family: More than three times a week    Frequency of Social Gatherings with Friends and Family: More than three times a week    Attends Religious Services: Never    Database administrator or Organizations: No    Attends Engineer, structural: Never    Marital Status: Divorced    Tobacco Counseling Ready to quit: No Counseling given: Yes   Clinical Intake:  Pre-visit preparation completed: No  Pain : No/denies pain   Nutrition Risk Assessment:  Has the patient had any N/V/D within the last 2 months?  No  Does the patient have any non-healing wounds?  No  Has the patient had any unintentional weight loss or weight gain?  No   Diabetes:  Is the patient diabetic?  Yes  If diabetic, was a CBG obtained today?  Yes CBG 111 Taken by patient Did the patient bring in their glucometer from home?  No   How often do you monitor your CBG's? Daily.   Financial Strains and Diabetes Management:  Are you having any financial strains with the device, your supplies or your medication? No .  Does the patient want to be seen by Chronic Care Management for management of their diabetes?  No  Would the patient like to be referred to a Nutritionist or for Diabetic Management?  No   Diabetic Exams:  Diabetic Eye Exam: Completed . Overdue for diabetic eye exam. Pt has been advised about the importance in completing this exam. A referral has been placed today. Message sent to referral coordinator for scheduling purposes. Advised pt to expect a call from office referred to regarding appt.  Diabetic Foot Exam: Completed . Pt has been advised about the importance in completing this exam. Pt is scheduled for diabetic foot exam on Followed by Dr Tera Helper.    BMI - recorded: 31.93 Nutritional Risks: None Diabetes: Yes CBG done?: Yes (CBG 111 Taken by patient) CBG resulted in Enter/ Edit results?: Yes Did pt. bring in CBG monitor from home?: No  How often do you need to have someone help you when you read instructions, pamphlets, or other written materials from your doctor or pharmacy?: 1 - Never  Diabetic?  Yes  Interpreter Needed?: No  Information entered by :: Theresa Mulligan LPN   Activities of Daily Living    08/23/2022    9:41 AM 01/16/2022    9:47 AM  In your present state of health, do you have any difficulty performing the following activities:  Hearing? 0 0  Vision? 0 1  Difficulty concentrating or making decisions? 0 0  Walking or climbing stairs? 0 0  Dressing or bathing? 0 0  Doing errands, shopping? 0 0  Preparing Food and eating ? N   Using the Toilet? N   In the past six months, have you accidently leaked urine? N  Do you have problems with loss of bowel control? N   Managing your Medications? N   Managing your FiCarlean Jewsusekeeping or managing your  Housekeeping? N     Patient Care Team: Boscia, Heather E, NP as PCP - General (Family Medicine) Clark-Burning, Victorino Dike, PA-C (Inactive) (Dermatology) Coletta Memos, MD as Consulting Physician (Neurosurgery) Comer, Belia Heman, MD as Consulting Physician (Infectious Diseases) Carlus Pavlov, MD as Consulting Physician (Endocrinology) Janalyn Harder, MD (Inactive) as Consulting Physician (Dermatology)  Indicate any recent Medical Services you may have received from other than [facility] providers in the past year (date may be approximate).     Assessment:   This is a routine wellness examination for Louis Carney.  Hearing/Vision screen Hearing Screening - Comments:: Denies hearing difficulties   Vision Screening - Comments:: - Not up to date with routine eye exams Patient deferred   Dietary issues and exercise activities discussed: Current Exercise Habits: The patient does not participate in regular exercise at present, Exercise limited by: None identified   Goals Addressed               This Visit's Progress     No current goals (pt-stated)         Depression Screen    08/23/2022    9:41 AM 01/16/2022    9:46 AM 09/06/2021   10:19 AM 03/09/2021    2:54 PM 07/06/2020   10:03 AM 03/07/2020    1:16 PM 02/17/2020    3:31 PM  PHQ 2/9 Scores  PHQ - 2 Score 0 0 1 1 0 0 0  PHQ- 9 Score  0 4 4    Fall Risk    08/23/2022    9:42 AM 01/16/2022    9:46 AM 09/06/2021   10:18 AM 03/09/2021    2:53 PM 01/24/2021   10:03 PM  Fall Risk   Falls in the past year? 1 0 1 1 0  Number falls in past yr: 0 0 1 1 0  Injury with Fall? 0 0 0 0 0  Risk for fall due to : No Fall Risks No Fall Risks Impaired balance/gait;Impaired mobility History of fall(s) No Fall Risks  Follow up Falls prevention discussed Falls evaluation completed;Education provided Falls evaluation completed Falls evaluation completed     FALL RISK PREVENTION PERTAINING TO THE HOME:  Any stairs in or around the home? Yes   If so, are there any without handrails? No  Home free of loose throw rugs in walkways, pet beds, electrical cords, etc? Yes  Adequate lighting in your home to reduce risk of falls? Yes   ASSISTIVE DEVICES UTILIZED TO PREVENT FALLS:  Life alert? No  Use of a cane, walker or w/c? No  Grab bars in the bathroom? Yes  Shower chair or bench in shower? No  Elevated toilet seat or a handicapped toilet? No   TIMED UP AND GO:  Was the test performed? No . Audio visit  Cognitive Function:        08/23/2022    9:43 AM 01/24/2021   10:07 PM  6CIT Screen  What Year? 0 points 0 points  What month? 0 points 0 points  What time? 0 points 0 points  Count back from 20 0 points 0 points  Months in reverse 0 points 0 points  Repeat phrase 0 points 0 points  Total Score 0 points 0 points    Immunizations  There is no immunization history on file for this  patient.  TDAP status: Due, Education has been provided regarding the importance of this vaccine. Advised may receive this vaccine at local pharmacy or Health Dept. Aware to provide a copy of the vaccination record if obtained from local pharmacy or Health Dept. Verbalized acceptance and understanding.  Flu Vaccine status: Up to date    Covid-19 vaccine status: Declined, Education has been provided regarding the importance of this vaccine but patient still declined. Advised may receive this vaccine at local pharmacy or Health Dept.or vaccine clinic. Aware to provide a copy of the vaccination record if obtained from local pharmacy or Health Dept. Verbalized acceptance and understanding.  Qualifies for Shingles Vaccine? No   Zostavax completed No   Shingrix Completed?: No.    Education has been provided regarding the importance of this vaccine. Patient has been advised to call insurance company to determine out of pocket expense if they have not yet received this vaccine. Advised may also receive vaccine at local pharmacy or Health Dept.  Verbalized acceptance and understanding.  Screening Tests Health Maintenance  Topic Date Due   DTaP/Tdap/Td (1 - Tdap) Never done   FOOT EXAM  02/05/2020   Diabetic kidney evaluation - eGFR measurement  09/07/2022   OPHTHALMOLOGY EXAM  08/23/2022 (Originally 01/29/2020)   Diabetic kidney evaluation - Urine ACR  08/24/2022 (Originally 02/20/2022)   COVID-19 Vaccine (1) 09/08/2022 (Originally 09/02/1985)   INFLUENZA VACCINE  11/29/2022   HEMOGLOBIN A1C  12/26/2022   Medicare Annual Wellness (AWV)  08/23/2023   Hepatitis C Screening  Completed   HIV Screening  Completed   HPV VACCINES  Aged Out    Health Maintenance  Health Maintenance Due  Topic Date Due   DTaP/Tdap/Td (1 - Tdap) Never done   FOOT EXAM  02/05/2020   Diabetic kidney evaluation - eGFR measurement  09/07/2022      Lung Cancer Screening: (Low Dose CT Chest recommended if Age 37-80 years, 30 pack-year currently smoking OR have quit w/in 15years.) does qualify.   Lung Cancer Screening Referral: Deferred  Additional Screening:  Hepatitis C Screening: does qualify; Completed 05/22/19  Vision Screening: Recommended annual ophthalmology exams for early detection of glaucoma and other disorders of the eye. Is the patient up to date with their annual eye exam?  No Who is the provider or what is the name of the office in which the patient attends annual eye exams? No If pt is not established with a provider, would they like to be referred to a provider to establish care? No .   Dental Screening: Recommended annual dental exams for proper oral hygiene  Community Resource Referral / Chronic Care Management:  CRR required this visit?  No   CCM required this visit?  No      Plan:     I have personally reviewed and noted the following in the patient's chart:   Medical and social history Use of alcohol, tobacco or illicit drugs  Current medications and supplements including opioid prescriptions. Patient is not  currently taking opioid prescriptions. Functional ability and status Nutritional status Physical activity Advanced directives List of other physicians Hospitalizations, surgeries, and ER visits in previous 12 months Vitals Screenings to include cognitive, depression, and falls Referrals and appointments  In addition, I have reviewed and discussed with patient certain preventive protocols, quality metrics, and best practice recommendations. A written personalized care plan for preventive services as well as general preventive health recommendations were provided to patient.     Tillie Rung, LPN  08/23/2022   Nurse Notes: Patient request f/u with concerns of increased allergy symptoms. Patient due Diabetic kidney evaluation-eGFR measurement

## 2022-09-06 ENCOUNTER — Ambulatory Visit (INDEPENDENT_AMBULATORY_CARE_PROVIDER_SITE_OTHER): Payer: Medicare Other | Admitting: Internal Medicine

## 2022-09-06 ENCOUNTER — Encounter: Payer: Self-pay | Admitting: Internal Medicine

## 2022-09-06 VITALS — BP 128/78 | HR 85 | Ht 68.0 in | Wt 211.6 lb

## 2022-09-06 DIAGNOSIS — E781 Pure hyperglyceridemia: Secondary | ICD-10-CM

## 2022-09-06 DIAGNOSIS — Z7985 Long-term (current) use of injectable non-insulin antidiabetic drugs: Secondary | ICD-10-CM | POA: Diagnosis not present

## 2022-09-06 DIAGNOSIS — E782 Mixed hyperlipidemia: Secondary | ICD-10-CM | POA: Diagnosis not present

## 2022-09-06 DIAGNOSIS — Z794 Long term (current) use of insulin: Secondary | ICD-10-CM

## 2022-09-06 DIAGNOSIS — E669 Obesity, unspecified: Secondary | ICD-10-CM

## 2022-09-06 DIAGNOSIS — E1165 Type 2 diabetes mellitus with hyperglycemia: Secondary | ICD-10-CM | POA: Diagnosis not present

## 2022-09-06 LAB — COMPREHENSIVE METABOLIC PANEL
ALT: 38 U/L (ref 0–53)
AST: 30 U/L (ref 0–37)
Albumin: 4.6 g/dL (ref 3.5–5.2)
Alkaline Phosphatase: 44 U/L (ref 39–117)
BUN: 11 mg/dL (ref 6–23)
CO2: 25 mEq/L (ref 19–32)
Calcium: 9.9 mg/dL (ref 8.4–10.5)
Chloride: 98 mEq/L (ref 96–112)
Creatinine, Ser: 0.79 mg/dL (ref 0.40–1.50)
GFR: 109.19 mL/min (ref >60.00)
Glucose, Bld: 111 mg/dL — ABNORMAL HIGH (ref 70–99)
Potassium: 4.3 mEq/L (ref 3.5–5.1)
Sodium: 135 mEq/L (ref 135–145)
Total Bilirubin: 0.6 mg/dL (ref 0.2–1.2)
Total Protein: 7.6 g/dL (ref 6.0–8.3)

## 2022-09-06 LAB — LDL CHOLESTEROL, DIRECT: Direct LDL: 127 mg/dL

## 2022-09-06 LAB — MICROALBUMIN / CREATININE URINE RATIO
Creatinine,U: 117.3 mg/dL
Microalb Creat Ratio: 5 mg/g (ref 0.0–30.0)
Microalb, Ur: 5.9 mg/dL — ABNORMAL HIGH (ref 0.0–1.9)

## 2022-09-06 LAB — LIPID PANEL
Cholesterol: 213 mg/dL — ABNORMAL HIGH (ref 0–200)
HDL: 33.5 mg/dL — ABNORMAL LOW (ref >39.00)
Total CHOL/HDL Ratio: 6
Triglycerides: 540 mg/dL — ABNORMAL HIGH (ref 0.0–149.0)

## 2022-09-06 LAB — POCT GLYCOSYLATED HEMOGLOBIN (HGB A1C): Hemoglobin A1C: 5.3 % (ref 4.0–5.6)

## 2022-09-06 MED ORDER — ROSUVASTATIN CALCIUM 20 MG PO TABS: 20.0000 mg | ORAL_TABLET | Freq: Every day | ORAL | 3 refills | Status: DC

## 2022-09-06 MED ORDER — OZEMPIC (1 MG/DOSE) 4 MG/3ML ~~LOC~~ SOPN: 1.0000 mg | PEN_INJECTOR | SUBCUTANEOUS | 3 refills | Status: DC

## 2022-09-06 NOTE — Patient Instructions (Addendum)
Please continue: - Ozempic 1 mg weekly  Please call and schedule an eye appt.: [facility]:   Address: [address] [place], [place] [address]  [phone]  Please stop at the lab.  Please return in 6 months.

## 2022-09-06 NOTE — Progress Notes (Signed)
Patient ID: Louis Carney, male   DOB: October 03, 1980, 42 y.o.   MRN: [id number]   HPI: Louis Carney is a 42 y.o.-year-old male, initially referred by his PCP, Dr. Sharee Holster, returning for follow-up for DM2, dx in 09/2018, non-insulin-dependent, uncontrolled, without long-term complications.  Last visit 6 months ago.  He is not usually compliant with appointments.  Interim history: No increased urination, blurry vision, nausea, chest pain.  Has back pain - on disability.  He was referred to the pain clinic by PCP.  Reviewed history: He had dizziness, blurry vision >> checked sugar with neighbor's meter >> CBG high (HI, then rechecked: 540).  He saw PCP >> HbA1c was high x2 >> added insulin.  He felt much better after adding insulin.  However, sugars were still extremely high at last visit so we added rapid acting insulin and I made specific suggestions about his diet and alcohol intake.  Reviewed HbA1c levels: Lab Results  Component Value Date   HGBA1C 5.6 06/27/2022   HGBA1C 5.2 01/16/2022   HGBA1C 5.5 09/04/2021   HGBA1C 5.5 02/20/2021   HGBA1C 8.3 (A) 07/22/2020   HGBA1C 6.4 (A) 01/18/2020   HGBA1C 6.2 (A) 08/21/2019   HGBA1C 6.0 (A) 01/20/2019   HGBA1C 9.9 (A) 10/27/2018   HGBA1C 10.2 (H) 10/27/2018   Currently on: - Ozempic 0.5 >> 1 mg weekly  He was previously on NovoLog but stopped around 10/2020 after he ran out. He was previously on Lantus 25 units twice a day, but then reduced and stopped at the beginning of 2023.  Pt checks his sugars once a day: - am: 97-126 >> 126-164 >> 78-110 >> 88-120 >> 98-111 >> 106-132 - 2h after b'fast: n/c - before lunch: 300-340 >> <130 >> 140s >> 97-126 >> n/c - 2h after lunch: n/c - before dinner: 340 >> 128-132 >> n/c - 2h after dinner: n/c - bedtime:  124-168  >> up to 110 >> 80s-123 >> 90s-110s >> 100-130s - nighttime: n/c Lowest sugar was 78 >> 88 >> 98 >> 100; it is unclear at which CBG level he has hypoglycemia awareness. Highest sugar  was 460 ...>> 123 >> 110s >> 130s.  Glucometer:ReliOn  Pt's meals are: - Breakfast: skips - Lunch: [place] sandwich  - Dinner: lasagna, hamburger, steak + potatoes, pasta - Snacks:cookies, chips. He does not like greens. In the past, he was drinking 4-6 shots of vodka, 6 pack beer EVERY night >> then 5 Bud Light beers a night >> then liquor/vodka >> stopped.  -No CKD.  Last BUN/creatinine:  Lab Results  Component Value Date   BUN 12 09/06/2021   BUN 15 02/20/2021   CREATININE 0.73 (L) 09/06/2021   CREATININE 0.71 02/20/2021  On olmesartan.  -He had significant hypertriglyceridemia, which improved at last check; last set of lipids: Lab Results  Component Value Date   CHOL 201 (H) 02/20/2021   HDL 33.60 (L) 02/20/2021   LDLCALC Comment 10/27/2018   LDLDIRECT 124 (H) 09/06/2021   TRIG 295.0 (H) 02/20/2021   CHOLHDL 6 02/20/2021   On Crestor 10 mg daily.  He has a history of transaminitis.  Latest liver tests reviewed: Lab Results  Component Value Date   ALT 25 09/06/2021   AST 20 09/06/2021   ALKPHOS 49 09/06/2021   BILITOT 0.5 09/06/2021  He sees Dr. Leone Payor with [facility].    - last eye exam was in 2021: No DR reportedly.  -+ numbness and tingling in his R leg - from back pain -  see Dr. Mikal Plane.  Pt has FH of DM in father - type 2 DM. + significant alcoholism FH.  He is on Risankizumab in 04/2018 for psoriasis >> working. Prev. Embrel, Humira, etc. his psoriasis is much improved on meds. No history of pancreatitis or personal or family history of medullary thyroid cancer.  ROS: + See HPI + Back pain + Rash on hands (psoriasis) >> much improved >> now seeing [place] [facility] - on Skyrizi  I reviewed pt's medications, allergies, PMH, social hx, family hx, and changes were documented in the history of present illness. Otherwise, unchanged from my initial visit note.  Past Medical History:  Diagnosis Date   Arthritis    Diabetes (HCC)    type 1   GERD  (gastroesophageal reflux disease)    Headache    migraines   Hypertension    Psoriasis    since age 10 years   Tuberculosis    latent TB infection on treatment   Past Surgical History:  Procedure Laterality Date   CARPAL TUNNEL RELEASE Right 01/15/2018   Procedure: Right Carpal tunnel release;  Surgeon: Coletta Memos, MD;  Location: [facility] OR;  Service: Neurosurgery;  Laterality: Right;  Right Carpal tunnel release   CERVICAL SPINE SURGERY     LUMBAR LAMINECTOMY/DECOMPRESSION MICRODISCECTOMY Right 03/07/2018   Procedure: Right Lumbar 5 Sacral 1 Microdiscectomy;  Surgeon: Coletta Memos, MD;  Location: [facility] OR;  Service: Neurosurgery;  Laterality: Right;  Right Lumbar 5 Sacral 1 Microdiscectomy   NECK SURGERY     Social History   Socioeconomic History   Marital status: Married    Spouse name: Not on file   Number of children: 5   Years of education: Not on file   Highest education level: Not on file  Occupational History   Occupation: disabled  Tobacco Use   Smoking status: Every Day    Packs/day: 1.00    Years: 15.00    Additional pack years: 0.00    Total pack years: 15.00    Types: Cigarettes   Smokeless tobacco: Never  Vaping Use   Vaping Use: Never used  Substance and Sexual Activity   Alcohol use: Yes    Alcohol/week: 24.0 standard drinks of alcohol    Types: 24 Standard drinks or equivalent per week   Drug use: Never   Sexual activity: Yes    Partners: Female    Birth control/protection: None  Other Topics Concern   Not on file  Social History Narrative   Married, 3 biologic children 2 stepchildren    disabled due to back pain   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   6-8 beers nightly   Smoker   No drug use   Social Determinants of Corporate investment banker Strain: Low Risk  (08/23/2022)   Carney Financial Resource Strain (CARDIA)    Difficulty of Paying Living Expenses: Not hard at all  Food  Insecurity: No Food Insecurity (08/23/2022)   Hunger Vital Sign    Worried About Running Out of Food in the Last Year: Never true    Ran Out of Food in the Last Year: Never true  Transportation Needs: No Transportation Needs (08/23/2022)   PRAPARE - Administrator, Civil Service (Medical): No    Lack of Transportation (Non-Medical): No  Physical Activity: Inactive (08/23/2022)   Exercise Vital Sign    Days of Exercise per Week: 0 days    Minutes of Exercise per  Session: 0 min  Stress: No Stress Concern Present (08/23/2022)   [employer] of Occupational Health - Occupational Stress Questionnaire    Feeling of Stress : Not at all  Social Connections: Socially Isolated (08/23/2022)   Social Connection and Isolation Panel [NHANES]    Frequency of Communication with Friends and Family: More than three times a week    Frequency of Social Gatherings with Friends and Family: More than three times a week    Attends Religious Services: Never    Database administrator or Organizations: No    Attends Banker Meetings: Never    Marital Status: Divorced  Catering manager Violence: Not At Risk (08/23/2022)   Humiliation, Afraid, Rape, and Kick questionnaire    Fear of Current or Ex-Partner: No    Emotionally Abused: No    Physically Abused: No    Sexually Abused: No   Current Outpatient Medications on File Prior to Visit  Medication Sig Dispense Refill   albuterol (VENTOLIN HFA) 108 (90 Base) MCG/ACT inhaler Inhale 2 puffs into the lungs every 6 (six) hours as needed for wheezing or shortness of breath. 8 g 2   amLODipine (NORVASC) 10 MG tablet Take 1 tablet (10 mg total) by mouth daily. 90 tablet 3   blood glucose meter kit and supplies Dispense based on patient and insurance preference. Use to check glucose level three times daily before meals and 3 times daily 2 hours after meals (6 times daily total). (FOR ICD-10 E10.9, E11.9). 1 each 0   Clobetasol Prop Emollient Base  (CLOBETASOL PROPIONATE E) 0.05 % emollient cream Apply 1 application topically daily. 60 g 5   esomeprazole (NEXIUM) 20 MG capsule Take 20 mg by mouth daily at 12 noon.     ibuprofen (ADVIL) 200 MG tablet Take 1,200 mg by mouth daily.      metoprolol succinate (TOPROL-XL) 100 MG 24 hr tablet Take 1 tablet (100 mg total) by mouth daily. Take with or immediately following a meal. 90 tablet 1   olmesartan (BENICAR) 40 MG tablet Take 1 tablet (40 mg total) by mouth daily. 90 tablet 1   OneTouch Delica Lancets 33G MISC USE TO TEST BLOOD SUGARS 6 TIMES DAILY (BEFORE MEALS AND 2 HOURS AFTER MEALS) 200 each 0   ONETOUCH VERIO test strip USE TO TEST 6 TIMES DAILY (BEFORE MEALS AND 2 HOURS AFTER MEALS) 200 each 0   Risankizumab-rzaa (SKYRIZI PEN) 150 MG/ML SOAJ Inject 150 mg into the skin as directed. Every 12 weeks for maintenance. 1 mL 6   rosuvastatin (CRESTOR) 10 MG tablet Take 1 tablet (10 mg total) by mouth daily. 90 tablet 3   Semaglutide, 1 MG/DOSE, (OZEMPIC, 1 MG/DOSE,) 4 MG/3ML SOPN Inject 1 mg into the skin once a week. 9 mL 3   sildenafil (VIAGRA) 100 MG tablet TAKE ONE-HALF TABLET BY MOUTH 30 MINUTES PRIOR TO INTERCOURSE AS NEEDED 30 tablet 3   No current facility-administered medications on file prior to visit.   No Known Allergies Family History  Problem Relation Age of Onset   Alcohol abuse Mother    Hypertension Mother    Lung cancer Mother    Alcohol abuse Father    Hypertension Father    Colon cancer Father        dx in his late 53's   Esophageal cancer Neg Hx    Rectal cancer Neg Hx    PE: BP 128/78 (BP Location: Left Arm, Patient Position: Sitting, Cuff Size: Normal)  Pulse 85   Ht 5\' 8"  (1.727 m)   Wt 211 lb 9.6 oz (96 kg)   SpO2 96%   BMI 32.17 kg/m  Wt Readings from Last 3 Encounters:  09/06/22 211 lb 9.6 oz (96 kg)  08/23/22 210 lb (95.3 kg)  06/27/22 210 lb 12.8 oz (95.6 kg)   Constitutional: overweight, in NAD Eyes:  EOMI, no exophthalmos ENT: no neck  masses, no cervical lymphadenopathy Cardiovascular: RRR, No MRG Respiratory: CTA B Musculoskeletal: no deformities Skin:no rashes Neurological: no tremor with outstretched hands Diabetic Foot Exam - Simple   Simple Foot Form Diabetic Foot exam was performed with the following findings: Yes 09/06/2022  9:25 AM  Visual Inspection No deformities, no ulcerations, no other skin breakdown bilaterally: Yes Sensation Testing Intact to touch and monofilament testing bilaterally: Yes Pulse Check Posterior Tibialis and Dorsalis pulse intact bilaterally: Yes Comments    ASSESSMENT: 1. DM, noninsulin-dependent, without long-term complications -We ruled out antipancreatic autoimmunity and insulin deficiency  Component     Latest Ref Rng 01/20/2019  Glucose, Plasma     65 - 99 mg/dL 528 (H)   C-Peptide     0.80 - 3.85 ng/mL 2.43   Islet Cell Ab     Neg:<1:1  Negative   ZNT8 Antibodies     U/mL <15   Glutamic Acid Decarb Ab     <5 IU/mL <5     2.  Hyperlipidemia (mixed)  3.  Obesity class I  PLAN:  1. Patient with controlled type 2 diabetes, insulin independent now, after he started to workout at the gym and cut out alcohol.  At last visit HbA1c was excellent, at 5.6%, slightly higher than before.  Sugars were at goal so we continued the same dose of Ozempic. -At today's visit, sugars are at goal both in the morning and at night.  He did not see blood sugars higher than 130's.  We can continue the same dose of Ozempic.  I refilled this today for him. - I suggested to:  Patient Instructions  Please continue: - Ozempic 1 mg weekly  Please call and schedule an eye appt.: [facility]:   Address: [address] [place], [place] [address]  [phone]  Please stop at the lab.  Please return in 6 months.   - we checked his HbA1c: 5.3% (lower) - advised to check sugars at different times of the day - 1x a day, rotating check times - advised for yearly eye  exams >> he is not UTD.  He tells me that he does not have the Medicare vision plan.  I still advised him to call [facility] to see if he can be seen.  He may need to pay out-of-pocket, with which he agrees. - return to clinic in 6 months  2.  Mixed hyperlipidemia -Reviewed latest lipid panel from 01/2022: All fractions abnormal: Lab Results  Component Value Date   CHOL 201 (H) 02/20/2021   HDL 33.60 (L) 02/20/2021   LDLCALC Comment 10/27/2018   LDLDIRECT 124 (H) 09/06/2021   TRIG 295.0 (H) 02/20/2021   CHOLHDL 6 02/20/2021  -He continues Crestor 10 mg daily without side effects -He is due for another lipid panel -will check today (nonfasting)   3.  Obesity class I -We discussed in the past about cutting out alcohol, starches, fatty foods and snacks.  He was also constantly eating between meals and I strongly advised him to try to eliminate snacks but snack on fruit  if absolutely needed.   -He cut out alcohol last year -Weight was approximately stable at last visit; now gained 4 lbs -Will continue to Ozempic which should also help with weight loss -At last visit I recommended a more plant-based diet and he tried it but was not able to follow weight due to being on the road frequently.  Component     Latest Ref Rng 09/06/2022  Glucose     70 - 99 mg/dL 782 (H)   BUN     6 - 23 mg/dL 11   Creatinine     9.56 - 1.50 mg/dL 2.13   Sodium     086 - 145 mEq/L 135   Potassium     3.5 - 5.1 mEq/L 4.3   Chloride     96 - 112 mEq/L 98   CO2     19 - 32 mEq/L 25   Calcium     8.4 - 10.5 mg/dL 9.9   Total Protein     6.0 - 8.3 g/dL 7.6   Total Bilirubin     0.2 - 1.2 mg/dL 0.6   AST     0 - 37 U/L 30   ALT     0 - 53 U/L 38   Albumin     3.5 - 5.2 g/dL 4.6   Alkaline Phosphatase     39 - 117 U/L 44   Triglycerides     0.0 - 149.0 mg/dL 578.4 Triglyceride is over 400; calculations on Lipids are invalid. (H)   HDL Cholesterol     >39.00 mg/dL 69.62 (L)    Total CHOL/HDL Ratio 6   Hemoglobin A1C     4.0 - 5.6 % 5.3   Direct LDL     mg/dL 952.8   MICROALB/CREAT RATIO     0.0 - 30.0 mg/g 5.0   Cholesterol     0 - 200 mg/dL 413 (H)   Microalb, Ur     0.0 - 1.9 mg/dL 5.9 (H)   Creatinine,U     mg/dL 244.0   GFR     >10.27 mL/min 109.19   Triglycerides and LDL quite high, HDL slightly low.  The rest of the labs are at goal.  Will definitely recommend improvement in diet but I would also suggest to increase the Crestor to 20 mg daily.  Carlus Pavlov, MD PhD [facility]

## 2022-09-19 ENCOUNTER — Encounter: Payer: Medicare Other | Admitting: Nurse Practitioner

## 2022-09-20 ENCOUNTER — Ambulatory Visit (INDEPENDENT_AMBULATORY_CARE_PROVIDER_SITE_OTHER): Payer: Medicare Other | Admitting: Dermatology

## 2022-09-20 ENCOUNTER — Ambulatory Visit
Admission: RE | Admit: 2022-09-20 | Discharge: 2022-09-20 | Disposition: A | Discharge status: Home / Self Care | Payer: Medicare Other | Attending: Dermatology | Admitting: Dermatology

## 2022-09-20 ENCOUNTER — Ambulatory Visit
Admission: RE | Admit: 2022-09-20 | Discharge: 2022-09-20 | Disposition: A | Discharge status: Home / Self Care | Payer: Medicare Other | Source: Ambulatory Visit | Attending: Dermatology | Admitting: Dermatology

## 2022-09-20 VITALS — BP 149/88 | HR 87

## 2022-09-20 DIAGNOSIS — Z79899 Other long term (current) drug therapy: Secondary | ICD-10-CM

## 2022-09-20 DIAGNOSIS — Z7189 Other specified counseling: Secondary | ICD-10-CM

## 2022-09-20 DIAGNOSIS — Z8611 Personal history of tuberculosis: Secondary | ICD-10-CM | POA: Diagnosis not present

## 2022-09-20 DIAGNOSIS — L4050 Arthropathic psoriasis, unspecified: Secondary | ICD-10-CM

## 2022-09-20 DIAGNOSIS — J9811 Atelectasis: Secondary | ICD-10-CM | POA: Diagnosis not present

## 2022-09-20 DIAGNOSIS — L400 Psoriasis vulgaris: Secondary | ICD-10-CM

## 2022-09-20 MED ORDER — SKYRIZI PEN 150 MG/ML ~~LOC~~ SOAJ
150.0000 mg | SUBCUTANEOUS | 6 refills | Status: DC
Start: 2022-09-20 — End: 2022-12-26

## 2022-09-20 MED ORDER — CLOBETASOL PROPIONATE 0.05 % EX CREA: TOPICAL_CREAM | CUTANEOUS | 1 refills | Status: DC

## 2022-09-20 NOTE — Progress Notes (Signed)
   New Pt Visit   Subjective  Louis Carney is a 42 y.o. male who presents for the following: Psoriasis - was a patient at [facility] currently using [place] and doing well other than a stubborn plaque on the back for which he is using Calcipotriene. He has a hx of positive TB test for which he was treated.   The following portions of the chart were reviewed this encounter and updated as appropriate: medications, allergies, medical history  Review of Systems:  No other skin or systemic complaints except as noted in HPI or Assessment and Plan.  Objective  Well appearing patient in no apparent distress; mood and affect are within normal limits.  Areas Examined: The face, scalp, hands, knees, elbows.  Relevant exam findings are noted in the Assessment and Plan.   Assessment & Plan   Plaque psoriasis and Psoriatic Arthritis  Related Procedures Ambulatory referral to Rheumatology - -to evaluate for Psoriatic Arthritis  Related Medications Clobetasol Prop Emollient Base (CLOBETASOL PROPIONATE E) 0.05 % emollient cream Apply 1 application topically daily.  Risankizumab-rzaa (SKYRIZI PEN) 150 MG/ML SOAJ Inject 150 mg into the skin as directed. Every 12 weeks for maintenance.  History of tuberculosis Related Procedures DG Chest 2 View    PSORIASIS of skin Hypertrophic plaque on the sacral area.  Well-demarcated erythematous papules/plaques with silvery scale, guttate pink scaly papules. 20% BSA prior to tx per patient hx.  Chronic and persistent condition with duration or expected duration over one year. Condition is bothersome/symptomatic for patient. Currently flared.  Pt c/o joint/back pain  - reviewed labs Treatment Plan:  Counseling on psoriasis and coordination of care  psoriasis is a chronic non-curable, but treatable genetic/hereditary disease that may have other systemic features affecting other organ systems such as joints (Psoriatic Arthritis). It  is associated with an increased risk of inflammatory bowel disease, heart disease, non-alcoholic fatty liver disease, and depression.  Treatments include light and laser treatments; topical medications; and systemic medications including oral and injectables.  Continue Calcipotriene cream QD. Zoryve and Vtama not on insurance formulary.   Continue Skyrizi injections SQ Q12W. Reviewed risks of biologics including immunosuppression, infections, injection site reaction, and failure to improve condition. Goal is control of skin condition, not cure.  Some older biologics such as Humira and Enbrel may slightly increase risk of malignancy and may worsen congestive heart failure.  Taltz and Cosentyx may cause inflammatory bowel disease to flare. The use of biologics requires long term medication management, including periodic office visits and monitoring of blood work.  Recommend referral to rheumatology.   Return in about 4 months (around 01/21/2023) for psoriasis follow up and TBSE.  Maylene Roes, CMA, am acting as scribe for Armida Sans, MD .  Documentation: I have reviewed the above documentation for accuracy and completeness, and I agree with the above.  Armida Sans, MD

## 2022-09-20 NOTE — Patient Instructions (Addendum)
For X-RAY: [facility] Outpatient Imaging at [facility] [address] [place],  [place]  [address] Main: [phone]  Due to recent changes in healthcare laws, you may see results of your pathology and/or laboratory studies on MyChart before the doctors have had a chance to review them. We understand that in some cases there may be results that are confusing or concerning to you. Please understand that not all results are received at the same time and often the doctors may need to interpret multiple results in order to provide you with the best plan of care or course of treatment. Therefore, we ask that you please give [place] 2 business days to thoroughly review all your results before contacting the office for clarification. Should we see a critical lab result, you will be contacted sooner.   If You Need Anything After Your Visit  If you have any questions or concerns for your doctor, please call our main line at [phone] and press option 4 to reach your doctor's medical assistant. If no one answers, please leave a voicemail as directed and we will return your call as soon as possible. Messages left after 4 pm will be answered the following business day.   You may also send [place] a message via MyChart. We typically respond to MyChart messages within 1-2 business days.  For prescription refills, please ask your pharmacy to contact our office. Our fax number is [phone].  If you have an urgent issue when the clinic is closed that cannot wait until the next business day, you can page your doctor at the number below.    Please note that while we do our best to be available for urgent issues outside of office hours, we are not available 24/7.   If you have an urgent issue and are unable to reach [place], you may choose to seek medical care at your doctor's office, retail clinic, urgent care center, or emergency room.  If you have a medical emergency, please immediately call  911 or go to the emergency department.  Pager Numbers  - Dr. Gwen Pounds: [phone]  - Dr. Neale Burly: [phone]  - Dr. Roseanne Reno: [phone]  In the event of inclement weather, please call our main line at [phone] for an update on the status of any delays or closures.  Dermatology Medication Tips: Please keep the boxes that topical medications come in in order to help keep track of the instructions about where and how to use these. Pharmacies typically print the medication instructions only on the boxes and not directly on the medication tubes.   If your medication is too expensive, please contact our office at [phone] option 4 or send [place] a message through MyChart.   We are unable to tell what your co-pay for medications will be in advance as this is different depending on your insurance coverage. However, we may be able to find a substitute medication at lower cost or fill out paperwork to get insurance to cover a needed medication.   If a prior authorization is required to get your medication covered by your insurance company, please allow [place] 1-2 business days to complete this process.  Drug prices often vary depending on where the prescription is filled and some pharmacies may offer cheaper prices.  The website www.goodrx.com contains coupons for medications through different pharmacies. The prices here do not account for what the cost may be with help from insurance (it may be cheaper with your insurance), but the website can  give you the price if you did not use any insurance.  - You can print the associated coupon and take it with your prescription to the pharmacy.  - You may also stop by our office during regular business hours and pick up a GoodRx coupon card.  - If you need your prescription sent electronically to a different pharmacy, notify our office through [facility] or by phone at [phone] option 4.     Si Usted Necesita Algo Despus de Su  Visita  Tambin puede enviarnos un mensaje a travs de Clinical cytogeneticist. Por lo general respondemos a los mensajes de MyChart en el transcurso de 1 a 2 das hbiles.  Para renovar recetas, por favor pida a su farmacia que se ponga en contacto con nuestra oficina. Annie Sable de fax es [place] [phone].  Si tiene un asunto urgente cuando la clnica est cerrada y que no puede esperar hasta el siguiente da hbil, puede llamar/localizar a su doctor(a) al nmero que aparece a continuacin.   Por favor, tenga en cuenta que aunque hacemos todo lo posible para estar disponibles para asuntos urgentes fuera del horario de [place], no estamos disponibles las 24 horas del da, los 7 [address]  [address] de la [place].   Si tiene un problema urgente y no puede comunicarse con nosotros, puede optar por buscar atencin mdica  en el consultorio de su doctor(a), en una clnica privada, en un centro de atencin urgente o en una sala de emergencias.  Si tiene Engineer, drilling, por favor llame inmediatamente al 911 o vaya a la sala de emergencias.  Nmeros de bper  - Dr. Gwen Pounds: [phone]  - Dra. Moye: [phone]  - Dra. Roseanne Reno: [phone]  En caso de inclemencias del [place], por favor llame a Lacy Duverney principal al [phone] para una actualizacin sobre el [place] de cualquier retraso o cierre.  Consejos para la medicacin en dermatologa: Por favor, guarde las cajas en las que vienen los medicamentos de uso tpico para ayudarle a seguir las instrucciones sobre dnde y cmo usarlos. Las farmacias generalmente imprimen las instrucciones del medicamento slo en las cajas y no directamente en los tubos del [place].   Si su medicamento es muy caro, por favor, pngase en contacto con Rolm Gala llamando al [phone] y presione la opcin 4 o envenos un mensaje a travs de Clinical cytogeneticist.   No podemos decirle cul ser su copago por los medicamentos por adelantado ya que esto es diferente dependiendo de la  cobertura de su seguro. Sin embargo, es posible que podamos encontrar un medicamento sustituto a Audiological scientist un formulario para que el seguro cubra el medicamento que se considera necesario.   Si se requiere una autorizacin previa para que su compaa de seguros [place] su medicamento, por favor permtanos de 1 a 2 das hbiles para completar [address]  Los precios de los medicamentos varan con frecuencia dependiendo del Environmental consultant de dnde se surte la receta y alguna farmacias pueden ofrecer precios ms baratos.  El sitio web www.goodrx.com tiene cupones para medicamentos de Health and safety inspector. Los precios aqu no tienen en cuenta lo que podra costar con la ayuda del seguro (puede ser ms barato con su seguro), pero el sitio web puede darle el precio si no utiliz Tourist information centre manager.  - Puede imprimir el cupn correspondiente y llevarlo con su receta a la farmacia.  - Tambin puede pasar por nuestra oficina durante el horario de atencin regular y Education officer, museum una tarjeta de cupones de GoodRx.  -  Si necesita que su receta se enve electrnicamente a Psychiatrist, informe a nuestra oficina a travs de MyChart de [place] o por telfono llamando al [phone] y presione la opcin 4.

## 2022-09-25 ENCOUNTER — Ambulatory Visit (INDEPENDENT_AMBULATORY_CARE_PROVIDER_SITE_OTHER): Payer: Medicare Other | Admitting: Nurse Practitioner

## 2022-09-25 ENCOUNTER — Encounter: Payer: Self-pay | Admitting: Nurse Practitioner

## 2022-09-25 VITALS — BP 128/89 | HR 98 | Ht 68.0 in | Wt 209.0 lb

## 2022-09-25 DIAGNOSIS — Z794 Long term (current) use of insulin: Secondary | ICD-10-CM | POA: Diagnosis not present

## 2022-09-25 DIAGNOSIS — J329 Chronic sinusitis, unspecified: Secondary | ICD-10-CM | POA: Diagnosis not present

## 2022-09-25 DIAGNOSIS — J3081 Allergic rhinitis due to animal (cat) (dog) hair and dander: Secondary | ICD-10-CM

## 2022-09-25 DIAGNOSIS — E1169 Type 2 diabetes mellitus with other specified complication: Secondary | ICD-10-CM

## 2022-09-25 DIAGNOSIS — I152 Hypertension secondary to endocrine disorders: Secondary | ICD-10-CM | POA: Diagnosis not present

## 2022-09-25 DIAGNOSIS — E1159 Type 2 diabetes mellitus with other circulatory complications: Secondary | ICD-10-CM

## 2022-09-25 DIAGNOSIS — E1165 Type 2 diabetes mellitus with hyperglycemia: Secondary | ICD-10-CM

## 2022-09-25 DIAGNOSIS — E785 Hyperlipidemia, unspecified: Secondary | ICD-10-CM

## 2022-09-25 MED ORDER — AZELASTINE HCL 0.1 % NA SOLN
2.0000 | Freq: Two times a day (BID) | NASAL | 5 refills | Status: DC
Start: 2022-09-25 — End: 2023-05-24

## 2022-09-25 MED ORDER — MONTELUKAST SODIUM 10 MG PO TABS
10.0000 mg | ORAL_TABLET | Freq: Every day | ORAL | 3 refills | Status: DC
Start: 2022-09-25 — End: 2023-07-02

## 2022-09-25 MED ORDER — CEFUROXIME AXETIL 500 MG PO TABS
500.0000 mg | ORAL_TABLET | Freq: Two times a day (BID) | ORAL | 0 refills | Status: DC
Start: 2022-09-25 — End: 2022-12-10

## 2022-09-25 NOTE — Assessment & Plan Note (Signed)
Add astelin nasal spray - 2 sprays in both nostrils daily  Add singulair 10 mg daily  Refer to allergy/asthma for further evaluation and treatment

## 2022-09-25 NOTE — Assessment & Plan Note (Signed)
Stable.  Continue current medication

## 2022-09-25 NOTE — Assessment & Plan Note (Signed)
Most recent HgbA1c per endocrinology 5.3 -continue all diabetic medication as before.  Check Hgba1c 3 months  Follow up with endocrinology as scheduled

## 2022-09-25 NOTE — Assessment & Plan Note (Signed)
Treat with ceftin twice daily for 10 days.  Use prescribed treatment for allergies. Refer to allergy/asthma for further evaluation and treatment

## 2022-09-25 NOTE — Assessment & Plan Note (Signed)
Crestor increased to 20 mg daily per endocrinology.  Check fasting lipids and CMP in 3 months and adjust treatment as indicated

## 2022-09-25 NOTE — Progress Notes (Signed)
Established patient visit   Patient: Louis Carney   DOB: [birth year]   42 y.o. Male  MRN: [id number] Visit Date: 09/25/2022   Chief Complaint  Patient presents with   Medical Management of Chronic Issues   Subjective    HPI  Follow up  -DM2 --does see endocrinology  --HgbA1c 5.3 on 09/06/2022.  -- crestor was increased to 20 mg  -continue ozempic 1 mg weekly  -keeps coming off of medications  --crestor was increased to 20 mg daily  -having trouble with sinuses.  --did see ENT. Was treated for infection for a while.  --was told to get allergies checked, but was advised to [facility].  --concerned that he is allergic to his dog --agreeable to have referral to allergy/immunology for further evaluation.   -he denies chest pain, chest pressure, or shortness of breath. He denies headaches or visual disturbances. He denies abdominal pain, nausea, vomiting, or changes in bowel or bladder habits.    Medications: Outpatient Medications Prior to Visit  Medication Sig   albuterol (VENTOLIN HFA) 108 (90 Base) MCG/ACT inhaler Inhale 2 puffs into the lungs every 6 (six) hours as needed for wheezing or shortness of breath.   amLODipine (NORVASC) 10 MG tablet Take 1 tablet (10 mg total) by mouth daily.   blood glucose meter kit and supplies Dispense based on patient and insurance preference. Use to check glucose level three times daily before meals and 3 times daily 2 hours after meals (6 times daily total). (FOR ICD-10 E10.9, E11.9).   clobetasol cream (TEMOVATE) 0.05 % Apply to aa psoriasis QD 3d/wk. Avoid applying to face, groin, and axilla. Use as directed. Long-term use can cause thinning of the skin.   Clobetasol Prop Emollient Base (CLOBETASOL PROPIONATE E) 0.05 % emollient cream Apply 1 application topically daily.   esomeprazole (NEXIUM) 20 MG capsule Take 20 mg by mouth daily at 12 noon.   ibuprofen (ADVIL) 200 MG tablet Take 1,200 mg by mouth daily.     metoprolol succinate (TOPROL-XL) 100 MG 24 hr tablet Take 1 tablet (100 mg total) by mouth daily. Take with or immediately following a meal.   olmesartan (BENICAR) 40 MG tablet Take 1 tablet (40 mg total) by mouth daily.   OneTouch Delica Lancets 33G MISC USE TO TEST BLOOD SUGARS 6 TIMES DAILY (BEFORE MEALS AND 2 HOURS AFTER MEALS)   ONETOUCH VERIO test strip USE TO TEST 6 TIMES DAILY (BEFORE MEALS AND 2 HOURS AFTER MEALS)   Risankizumab-rzaa (SKYRIZI PEN) 150 MG/ML SOAJ Inject 150 mg into the skin as directed. Every 12 weeks for maintenance.   rosuvastatin (CRESTOR) 20 MG tablet Take 1 tablet (20 mg total) by mouth daily.   Semaglutide, 1 MG/DOSE, (OZEMPIC, 1 MG/DOSE,) 4 MG/3ML SOPN Inject 1 mg into the skin once a week.   sildenafil (VIAGRA) 100 MG tablet TAKE ONE-HALF TABLET BY MOUTH 30 MINUTES PRIOR TO INTERCOURSE AS NEEDED   No facility-administered medications prior to visit.    Review of Systems See HPI     Last CBC Lab Results  Component Value Date   WBC 8.2 09/06/2021   HGB 15.9 09/06/2021   HCT 45.3 09/06/2021   MCV 89 09/06/2021   MCH 31.2 09/06/2021   RDW 12.1 09/06/2021   PLT 272 09/06/2021   Last metabolic panel Lab Results  Component Value Date   GLUCOSE 111 (H) 09/06/2022   NA 135 09/06/2022   K 4.3 09/06/2022  CL 98 09/06/2022   CO2 25 09/06/2022   BUN 11 09/06/2022   CREATININE 0.79 09/06/2022   EGFR 116 09/06/2021   CALCIUM 9.9 09/06/2022   PHOS 4.1 10/27/2018   PROT 7.6 09/06/2022   ALBUMIN 4.6 09/06/2022   LABGLOB 2.6 09/06/2021   AGRATIO 1.8 09/06/2021   BILITOT 0.6 09/06/2022   ALKPHOS 44 09/06/2022   AST 30 09/06/2022   ALT 38 09/06/2022   ANIONGAP 9 12/25/2019   Last lipids Lab Results  Component Value Date   CHOL 213 (H) 09/06/2022   HDL 33.50 (L) 09/06/2022   LDLCALC Comment 10/27/2018   LDLDIRECT 127.0 09/06/2022   TRIG (H) 09/06/2022    540.0 Triglyceride is over 400; calculations on Lipids are invalid.   CHOLHDL 6 09/06/2022    Last hemoglobin A1c Lab Results  Component Value Date   HGBA1C 5.3 09/06/2022   Last thyroid functions Lab Results  Component Value Date   TSH 1.320 10/27/2018   Last vitamin D Lab Results  Component Value Date   VD25OH 13.1 (L) 07/28/2018       Objective     Today's Vitals   09/25/22 1316  BP: 128/89  Pulse: 98  SpO2: 100%  Weight: 209 lb (94.8 kg)  Height: 5\' 8"  (1.727 m)   Body mass index is 31.78 kg/m.  BP Readings from Last 3 Encounters:  09/25/22 128/89  09/20/22 (Abnormal) 149/88  09/06/22 128/78    Wt Readings from Last 3 Encounters:  09/25/22 209 lb (94.8 kg)  09/06/22 211 lb 9.6 oz (96 kg)  08/23/22 210 lb (95.3 kg)    Physical Exam Vitals and nursing note reviewed.  Constitutional:      Appearance: Normal appearance. He is well-developed.  HENT:     Head: Normocephalic and atraumatic.     Right Ear: Tympanic membrane is erythematous and bulging.     Left Ear: Tympanic membrane is erythematous and bulging.     Nose: Congestion present.     Right Sinus: No maxillary sinus tenderness or frontal sinus tenderness.     Left Sinus: No maxillary sinus tenderness or frontal sinus tenderness.     Mouth/Throat:     Mouth: Mucous membranes are moist.     Pharynx: Oropharynx is clear.  Eyes:     Extraocular Movements: Extraocular movements intact.     Conjunctiva/sclera: Conjunctivae normal.     Pupils: Pupils are equal, round, and reactive to light.  Neck:     Vascular: No carotid bruit.  Cardiovascular:     Rate and Rhythm: Normal rate and regular rhythm.     Pulses: Normal pulses.     Heart sounds: Normal heart sounds.  Pulmonary:     Effort: Pulmonary effort is normal.     Breath sounds: Normal breath sounds.  Abdominal:     Palpations: Abdomen is soft.  Musculoskeletal:        General: Normal range of motion.     Cervical back: Normal range of motion and neck supple.  Lymphadenopathy:     Cervical: Cervical adenopathy present.  Skin:     General: Skin is warm and dry.     Capillary Refill: Capillary refill takes less than 2 seconds.  Neurological:     General: No focal deficit present.     Mental Status: He is alert and oriented to person, place, and time.  Psychiatric:        Mood and Affect: Mood normal.  Behavior: Behavior normal.        Thought Content: Thought content normal.        Judgment: Judgment normal.      Assessment & Plan    Chronic sinusitis, unspecified location Assessment & Plan: Treat with ceftin twice daily for 10 days.  Use prescribed treatment for allergies. Refer to allergy/asthma for further evaluation and treatment   Orders: -     Ambulatory referral to Allergy -     Cefuroxime Axetil; Take 1 tablet (500 mg total) by mouth 2 (two) times daily with a meal.  Dispense: 20 tablet; Refill: 0  Allergic rhinitis due to animal hair and dander Assessment & Plan: Add astelin nasal spray - 2 sprays in both nostrils daily  Add singulair 10 mg daily  Refer to allergy/asthma for further evaluation and treatment   Orders: -     Ambulatory referral to Allergy -     Montelukast Sodium; Take 1 tablet (10 mg total) by mouth at bedtime.  Dispense: 30 tablet; Refill: 3 -     Azelastine HCl; Place 2 sprays into both nostrils 2 (two) times daily. Use in each nostril as directed  Dispense: 30 mL; Refill: 5  Type 2 diabetes mellitus with hyperglycemia, with long-term current use of insulin (HCC) Assessment & Plan: Most recent HgbA1c per endocrinology 5.3 -continue all diabetic medication as before.  Check Hgba1c 3 months  Follow up with endocrinology as scheduled    Hypertension associated with diabetes [facility]) Assessment & Plan: Stable.  Continue current medication.    Hyperlipidemia associated with type 2 diabetes mellitus (HCC) Assessment & Plan: Crestor increased to 20 mg daily per endocrinology.  Check fasting lipids and CMP in 3 months and adjust treatment as indicated       Return  in about 3 months (around 12/26/2022) for diabetes, check fasting lipids, CMP, and Hgba1c 1 week prior to visit .         Carlean Jews, NP  [facility] at [facility] [phone] (phone) [phone] (fax)  [facility]

## 2022-09-26 ENCOUNTER — Telehealth: Payer: Self-pay

## 2022-09-26 NOTE — Telephone Encounter (Signed)
-----   Message from Deirdre Evener, MD sent at 09/26/2022  4:57 PM EDT ----- Chest Xray from 09/25/2022 shows: No evidence of TB or other infectious process. Pt is on Skyrizi for Psoriasis  Continue Skyrizi Keep follow up appts

## 2022-09-26 NOTE — Telephone Encounter (Signed)
Left message for patient to call office for results/hd 

## 2022-09-27 ENCOUNTER — Telehealth: Payer: Self-pay

## 2022-09-27 NOTE — Telephone Encounter (Signed)
-----   Message from David C Kowalski, MD sent at 09/26/2022  4:57 PM EDT ----- Chest Xray from 09/25/2022 shows: No evidence of TB or other infectious process. Pt is on Skyrizi for Psoriasis  Continue Skyrizi Keep follow up appts 

## 2022-09-27 NOTE — Telephone Encounter (Signed)
Advised patient of chest xray results/hd

## 2022-10-03 ENCOUNTER — Encounter: Payer: Self-pay | Admitting: Dermatology

## 2022-11-26 ENCOUNTER — Telehealth: Payer: Self-pay

## 2022-11-26 NOTE — Telephone Encounter (Signed)
Left message on voicemail to return my call. Rheumatology LVM for patient to return their call to schedule appointment. If he would like to go through with the referral he should contact their office to schedule an appointment.

## 2022-12-06 ENCOUNTER — Other Ambulatory Visit: Payer: Self-pay | Admitting: Family Medicine

## 2022-12-06 DIAGNOSIS — E1169 Type 2 diabetes mellitus with other specified complication: Secondary | ICD-10-CM

## 2022-12-06 DIAGNOSIS — E1165 Type 2 diabetes mellitus with hyperglycemia: Secondary | ICD-10-CM

## 2022-12-06 DIAGNOSIS — I152 Hypertension secondary to endocrine disorders: Secondary | ICD-10-CM

## 2022-12-10 ENCOUNTER — Ambulatory Visit
Admission: EM | Admit: 2022-12-10 | Discharge: 2022-12-10 | Disposition: A | Discharge status: Home / Self Care | Payer: Medicare Other | Attending: Family Medicine | Admitting: Family Medicine

## 2022-12-10 DIAGNOSIS — Z1152 Encounter for screening for COVID-19: Secondary | ICD-10-CM | POA: Insufficient documentation

## 2022-12-10 DIAGNOSIS — J039 Acute tonsillitis, unspecified: Secondary | ICD-10-CM | POA: Diagnosis not present

## 2022-12-10 DIAGNOSIS — J029 Acute pharyngitis, unspecified: Secondary | ICD-10-CM | POA: Insufficient documentation

## 2022-12-10 LAB — POCT RAPID STREP A (OFFICE): Rapid Strep A Screen: NEGATIVE

## 2022-12-10 MED ORDER — ONDANSETRON 4 MG PO TBDP
4.0000 mg | ORAL_TABLET | Freq: Once | ORAL | Status: AC
  Administered 2022-12-10: 4 mg via ORAL

## 2022-12-10 MED ORDER — PREDNISONE 20 MG PO TABS: 20.0000 mg | ORAL_TABLET | Freq: Every day | ORAL | 0 refills | Status: AC

## 2022-12-10 MED ORDER — ONDANSETRON HCL 4 MG PO TABS: 8.0000 mg | ORAL_TABLET | Freq: Three times a day (TID) | ORAL | 0 refills | Status: DC | PRN

## 2022-12-10 MED ORDER — AMOXICILLIN 875 MG PO TABS: 875.0000 mg | ORAL_TABLET | Freq: Two times a day (BID) | ORAL | 0 refills | Status: DC

## 2022-12-10 MED ORDER — DEXAMETHASONE SODIUM PHOSPHATE 10 MG/ML IJ SOLN
10.0000 mg | Freq: Once | INTRAMUSCULAR | Status: AC
  Administered 2022-12-10: 10 mg via INTRAMUSCULAR

## 2022-12-10 NOTE — ED Provider Notes (Signed)
Louis Carney    CSN: [id number] Arrival date & time: 12/10/22  1343      History   Chief Complaint Chief Complaint  Patient presents with  . Headache  . Sore Throat  . Generalized Body Aches    HPI Louis Carney is a 42 y.o. male.   HPI Patient here with sore throat, chills, body aches and fever TMAX 101.9.  Symptoms present for 5 days. He has taken home COVID test which were negative. No known sick exposures. Difficulty sleeping due to pain and soreness of throat. Denies any associated URI symptoms.  Past Medical History:  Diagnosis Date  . Arthritis   . Diabetes (HCC)    type 1  . GERD (gastroesophageal reflux disease)   . Headache    migraines  . Hypertension   . Psoriasis    since age 41 years  . Tuberculosis    latent TB infection on treatment    Patient Active Problem List   Diagnosis Date Noted  . Chronic sinusitis 09/25/2022  . Allergic rhinitis due to animal hair and dander 09/25/2022  . Diabetes mellitus (HCC) 05/28/2019  . Morbid obesity (HCC) 05/28/2019  . Alcoholic hepatitis without ascites 05/28/2019  . Sprain of calcaneofibular ligament of ankle, initial encounter- L-  pt refused xrays of ankle 05/28/2019  . Alcohol abuse with alcohol-induced disorder (HCC) 02/07/2019  . Hepatitis 02/05/2019  . Adjustment disorder with mixed anxiety and depressed mood 02/05/2019  . Hepatocellular damage 11/07/2018  . Elevated liver function tests 11/07/2018  . Nausea 11/07/2018  . Alcohol abuse 09/09/2018  . Ulnar neuropathy 06/03/2018  . HNP (herniated nucleus pulposus), lumbar 03/07/2018  . Transaminitis 01/29/2018  . Medication monitoring encounter 11/29/2017  . TB lung, latent 11/06/2017  . Spinal stenosis of lumbar region 10/01/2017  . Cervical spondylosis with myelopathy 10/01/2017  . Family history of diabetes mellitus in father 09/12/2017  . High risk medication use 09/12/2017  . Vitamin D deficiency 09/12/2017  . Hyperlipidemia  associated with type 2 diabetes mellitus (HCC) 09/12/2017  . Low level of high density lipoprotein (HDL) 09/12/2017  . Hypertriglyceridemia 09/12/2017  . Psoriasis-onset age 32 has needed Cosentyx in past has failed Enbrel and Humira 08/26/2017  . Chronic radicular low back pain- R sided 08/26/2017  . History of excision of lamina of cervical vertebra for decompression of sp- C4-6inal cord 08/26/2017  . 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 RSD (reflex sympathetic dystrophy)- right entire leg painful to non-noxious stimuli 08/26/2017  . Cigarette smoker- 3 ppd for 6 yrs, then 1ppd for 9 yrs- 27 pk yr hx 08/26/2017  . Drinks beer- 6-8 beers/d on ave ( 10 Yrs or so) 08/26/2017  . Hypertension associated with diabetes (HCC) 08/26/2017  . NSAID long-term use-4 ibuprofen 3 times daily times at least 5 years 08/26/2017  . 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 Chronic fatigue 08/26/2017  . Mood disorder (HCC)- secondary to chronic pain syndrome 08/26/2017  . Insomnia 08/26/2017  . Environmental and seasonal allergies 08/26/2017  . ED (erectile dysfunction) 08/26/2017  . Allergic conjunctivitis of both eyes and rhinitis 08/26/2017  . Class 1 obesity 08/26/2017  . Plaque psoriasis 08/26/2017    Past Surgical History:  Procedure Laterality Date  . CARPAL TUNNEL RELEASE Right 01/15/2018   Procedure: Right Carpal tunnel release;  Surgeon: Coletta Memos, MD;  Location: [facility] OR;  Service: Neurosurgery;  Laterality: Right;  Right Carpal tunnel release  .  CERVICAL SPINE SURGERY    . LUMBAR LAMINECTOMY/DECOMPRESSION MICRODISCECTOMY Right 03/07/2018   Procedure: Right Lumbar 5 Sacral 1 Microdiscectomy;  Surgeon: Coletta Memos, MD;  Location: [facility] OR;  Service: Neurosurgery;  Laterality: Right;  Right Lumbar 5 Sacral 1 Microdiscectomy  . NECK SURGERY         Home Medications    Prior to Admission  medications   Medication Sig Start Date End Date Taking? Authorizing Provider  albuterol (VENTOLIN HFA) 108 (90 Base) MCG/ACT inhaler Inhale 2 puffs into the lungs every 6 (six) hours as needed for wheezing or shortness of breath. 09/06/21   Mayer Masker, PA-C  amLODipine (NORVASC) 10 MG tablet Take 1 tablet (10 mg total) by mouth daily. 06/27/22   Carlean Jews, NP  azelastine (ASTELIN) 0.1 % nasal spray Place 2 sprays into both nostrils 2 (two) times daily. Use in each nostril as directed 09/25/22   Carlean Jews, NP  blood glucose meter kit and supplies Dispense based on patient and insurance preference. Use to check glucose level three times daily before meals and 3 times daily 2 hours after meals (6 times daily total). (FOR ICD-10 E10.9, E11.9). 10/27/18   Opalski, Gavin Pound, DO  cefUROXime (CEFTIN) 500 MG tablet Take 1 tablet (500 mg total) by mouth 2 (two) times daily with a meal. Patient not taking: Reported on 12/10/2022 09/25/22   Carlean Jews, NP  clobetasol cream (TEMOVATE) 0.05 % Apply to aa psoriasis QD 3d/wk. Avoid applying to face, groin, and axilla. Use as directed. Long-term use can cause thinning of the skin. 09/20/22   Deirdre Evener, MD  Clobetasol Prop Emollient Base (CLOBETASOL PROPIONATE E) 0.05 % emollient cream Apply 1 application topically daily. 03/28/21   Janalyn Harder, MD  esomeprazole (NEXIUM) 20 MG capsule Take 20 mg by mouth daily at 12 noon.    [provider]  ibuprofen (ADVIL) 200 MG tablet Take 1,200 mg by mouth daily.     [provider]  metoprolol succinate (TOPROL-XL) 100 MG 24 hr tablet Take 1 tablet (100 mg total) by mouth daily. Take with or immediately following a meal. 06/27/22   Boscia, Kathlynn Grate, NP  montelukast (SINGULAIR) 10 MG tablet Take 1 tablet (10 mg total) by mouth at bedtime. 09/25/22   Carlean Jews, NP  olmesartan (BENICAR) 40 MG tablet Take 1 tablet (40 mg total) by mouth daily. 06/27/22   Carlean Jews, NP   OneTouch Delica Lancets 33G MISC USE TO TEST BLOOD SUGARS 6 TIMES DAILY (BEFORE MEALS AND 2 HOURS AFTER MEALS) 07/28/19   Opalski, Gavin Pound, DO  ONETOUCH VERIO test strip USE TO TEST 6 TIMES DAILY (BEFORE MEALS AND 2 HOURS AFTER MEALS) 07/28/19   Opalski, Gavin Pound, DO  Risankizumab-rzaa (SKYRIZI PEN) 150 MG/ML SOAJ Inject 150 mg into the skin as directed. Every 12 weeks for maintenance. 09/20/22   Deirdre Evener, MD  rosuvastatin (CRESTOR) 20 MG tablet Take 1 tablet (20 mg total) by mouth daily. 09/06/22   Carlus Pavlov, MD  Semaglutide, 1 MG/DOSE, (OZEMPIC, 1 MG/DOSE,) 4 MG/3ML SOPN Inject 1 mg into the skin once a week. 09/06/22   Carlus Pavlov, MD  sildenafil (VIAGRA) 100 MG tablet TAKE ONE-HALF TABLET BY MOUTH 30 MINUTES PRIOR TO INTERCOURSE AS NEEDED 06/27/22   Carlean Jews, NP    Family History Family History  Problem Relation Age of Onset  . Alcohol abuse Mother   . Hypertension Mother   . Lung cancer Mother   .  Alcohol abuse Father   . Hypertension Father   . Colon cancer Father        dx in his late 13's  . Esophageal cancer Neg Hx   . Rectal cancer Neg Hx     Social History Social History   Tobacco Use  . Smoking status: Every Day    Current packs/day: 1.00    Average packs/day: 1 pack/day for 15.0 years (15.0 ttl pk-yrs)    Types: Cigarettes  . Smokeless tobacco: Never  Vaping Use  . Vaping status: Never Used  Substance Use Topics  . Alcohol use: Yes    Alcohol/week: 24.0 standard drinks of alcohol    Types: 24 Standard drinks or equivalent per week  . Drug use: Never     Allergies   Patient has no known allergies.   Review of Systems Review of Systems   Physical Exam Triage Vital Signs ED Triage Vitals  Encounter Vitals Group     BP 12/10/22 1418 (!) 145/97     Systolic BP Percentile --      Diastolic BP Percentile --      Pulse Rate 12/10/22 1418 100     Resp 12/10/22 1418 18     Temp 12/10/22 1418 98 F (36.7 C)     Temp src --       SpO2 12/10/22 1418 98 %     Weight --      Height --      Head Circumference --      Peak Flow --      Pain Score 12/10/22 1407 2     Pain Loc --      Pain Education --      Exclude from Growth Chart --    No data found.  Updated Vital Signs BP (!) 145/97   Pulse 100   Temp 98 F (36.7 C)   Resp 18   SpO2 98%   Visual Acuity Right Eye Distance:   Left Eye Distance:   Bilateral Distance:    Right Eye Near:   Left Eye Near:    Bilateral Near:     Physical Exam   UC Treatments / Results  Labs (all labs ordered are listed, but only abnormal results are displayed) Labs Reviewed  SARS CORONAVIRUS 2 (TAT 6-24 HRS)  POCT RAPID STREP A (OFFICE)    EKG   Radiology No results found.  Procedures Procedures (including critical care time)  Medications Ordered in UC Medications - No data to display  Initial Impression / Assessment and Plan / UC Course  I have reviewed the triage vital signs and the nursing notes.  Pertinent labs & imaging results that were available during my care of the patient were reviewed by me and considered in my medical decision making (see chart for details).     *** Final Clinical Impressions(s) / UC Diagnoses   Final diagnoses:  Encounter for screening for COVID-19   Discharge Instructions   None    ED Prescriptions   None    PDMP not reviewed this encounter.

## 2022-12-10 NOTE — Discharge Instructions (Signed)
Today start amoxicillin 875 twice daily for total of 10 days, complete entire course of antibiotics.  You received a steroid shot while here in clinic.  You will start oral steroids which is prednisone tomorrow morning 20 mg once daily for 5 days this is to help the swelling in your throat.  COVID test is pending.  Your results should be available to view on MyChart within 24 to 48 hours.  If your COVID test is positive, our results nurse will give you a call however if you will see your results before the results nurse has an opportunity to call you.  Your symptoms should gradually improve over the course of the next 2 to 3 days.  If at any point your symptoms worsen or do not improve return for evaluation.

## 2022-12-10 NOTE — ED Triage Notes (Signed)
Patient to Urgent Care with complaints of sore throat/ painful swallowing/ sweats/ fever 101.9/ generalized body aches. Symptoms started Thursday.  Taking sudafed/ Mucinex/ dayquil/ ibuprofen.  Three negative home covid tests.

## 2022-12-19 ENCOUNTER — Other Ambulatory Visit: Payer: Medicare Other

## 2022-12-19 DIAGNOSIS — E1159 Type 2 diabetes mellitus with other circulatory complications: Secondary | ICD-10-CM | POA: Diagnosis not present

## 2022-12-19 DIAGNOSIS — E785 Hyperlipidemia, unspecified: Secondary | ICD-10-CM | POA: Diagnosis not present

## 2022-12-19 DIAGNOSIS — Z794 Long term (current) use of insulin: Secondary | ICD-10-CM | POA: Diagnosis not present

## 2022-12-19 DIAGNOSIS — E1165 Type 2 diabetes mellitus with hyperglycemia: Secondary | ICD-10-CM | POA: Diagnosis not present

## 2022-12-19 DIAGNOSIS — I152 Hypertension secondary to endocrine disorders: Secondary | ICD-10-CM | POA: Diagnosis not present

## 2022-12-19 DIAGNOSIS — E1169 Type 2 diabetes mellitus with other specified complication: Secondary | ICD-10-CM | POA: Diagnosis not present

## 2022-12-20 LAB — COMPREHENSIVE METABOLIC PANEL
ALT: 26 IU/L (ref 0–44)
AST: 18 IU/L (ref 0–40)
Albumin: 4 g/dL — ABNORMAL LOW (ref 4.1–5.1)
Alkaline Phosphatase: 63 IU/L (ref 44–121)
BUN/Creatinine Ratio: 12 (ref 9–20)
BUN: 9 mg/dL (ref 6–24)
Bilirubin Total: 0.6 mg/dL (ref 0.0–1.2)
CO2: 21 mmol/L (ref 20–29)
Calcium: 9.1 mg/dL (ref 8.7–10.2)
Chloride: 99 mmol/L (ref 96–106)
Creatinine, Ser: 0.74 mg/dL — ABNORMAL LOW (ref 0.76–1.27)
Globulin, Total: 2.7 g/dL (ref 1.5–4.5)
Glucose: 104 mg/dL — ABNORMAL HIGH (ref 70–99)
Potassium: 4.6 mmol/L (ref 3.5–5.2)
Sodium: 137 mmol/L (ref 134–144)
Total Protein: 6.7 g/dL (ref 6.0–8.5)
eGFR: 115 mL/min/{1.73_m2} (ref >59)

## 2022-12-20 LAB — LIPID PANEL
Chol/HDL Ratio: 6.5 ratio — ABNORMAL HIGH (ref 0.0–5.0)
Cholesterol, Total: 163 mg/dL (ref 100–199)
HDL: 25 mg/dL — ABNORMAL LOW (ref >39)
LDL Chol Calc (NIH): 97 mg/dL (ref 0–99)
Triglycerides: 237 mg/dL — ABNORMAL HIGH (ref 0–149)
VLDL Cholesterol Cal: 41 mg/dL — ABNORMAL HIGH (ref 5–40)

## 2022-12-20 LAB — HEMOGLOBIN A1C
Est. average glucose Bld gHb Est-mCnc: 114 mg/dL
Hgb A1c MFr Bld: 5.6 % (ref 4.8–5.6)

## 2022-12-26 ENCOUNTER — Ambulatory Visit: Payer: Medicare Other | Admitting: Dermatology

## 2022-12-26 VITALS — BP 147/88 | HR 77

## 2022-12-26 DIAGNOSIS — L400 Psoriasis vulgaris: Secondary | ICD-10-CM | POA: Diagnosis not present

## 2022-12-26 DIAGNOSIS — L0591 Pilonidal cyst without abscess: Secondary | ICD-10-CM

## 2022-12-26 DIAGNOSIS — L729 Follicular cyst of the skin and subcutaneous tissue, unspecified: Secondary | ICD-10-CM

## 2022-12-26 DIAGNOSIS — Z79899 Other long term (current) drug therapy: Secondary | ICD-10-CM | POA: Diagnosis not present

## 2022-12-26 DIAGNOSIS — Z7189 Other specified counseling: Secondary | ICD-10-CM

## 2022-12-26 MED ORDER — CLINDAMYCIN PHOSPHATE 1 % EX SOLN: CUTANEOUS | 5 refills | Status: DC

## 2022-12-26 MED ORDER — SKYRIZI PEN 150 MG/ML ~~LOC~~ SOAJ: 150.0000 mg | SUBCUTANEOUS | 1 refills | Status: DC

## 2022-12-26 NOTE — Patient Instructions (Signed)

## 2022-12-26 NOTE — Progress Notes (Signed)
Follow-Up Visit   Subjective  Louis Carney is a 42 y.o. male who presents for the following: Psoriasis, currently using Skyizi SQ Q3M, tolerating well with no s/e, and Clobetasol 0.05% cream to aa's QD-BID PRN. Patient c/o recurrent cyst that flares in the buttocks crease, and patient would like to discuss treatment options.   The following portions of the chart were reviewed this encounter and updated as appropriate: medications, allergies, medical history  Review of Systems:  No other skin or systemic complaints except as noted in HPI or Assessment and Plan.  Objective  Well appearing patient in no apparent distress; mood and affect are within normal limits.  Areas Examined: The face, hands, arms   Relevant exam findings are noted in the Assessment and Plan.   Assessment & Plan   Encounter for long-term (current) use of high-risk medication  Related Procedures CBC with Differential/Platelets  Plaque psoriasis  Related Medications Clobetasol Prop Emollient Base (CLOBETASOL PROPIONATE E) 0.05 % emollient cream Apply 1 application topically daily.  risankizumab-rzaa (SKYRIZI PEN) 150 MG/ML pen Inject 1 mL (150 mg total) into the skin as directed. Every 12 weeks for maintenance.    PSORIASIS - with psoriatic arthritis, reviewed recent labs and chest x-ray Small plaques of the hands. Persistent plaque of the sacral area 20% BSA.  Chronic and persistent condition with duration or expected duration over one year. Condition is bothersome/symptomatic for patient. Currently flared. Patient does notice that condition flares two months after Skyrizi injection  Treatment Plan: Continue Clobetasol 0.05% cream to aa's QD-BID and Skyrizi SQ Q3M.   Topical steroids (such as triamcinolone, fluocinolone, fluocinonide, mometasone, clobetasol, halobetasol, betamethasone, hydrocortisone) can cause thinning and lightening of the skin if they are used for too long in the same area. Your  physician has selected the right strength medicine for your problem and area affected on the body. Please use your medication only as directed by your physician to prevent side effects.   Reviewed risks of biologics including immunosuppression, infections, injection site reaction, and failure to improve condition. Goal is control of skin condition, not cure.  Some older biologics such as Humira and Enbrel may slightly increase risk of malignancy and may worsen congestive heart failure.  Taltz and Cosentyx may cause inflammatory bowel disease to flare. The use of biologics requires long term medication management, including periodic office visits and monitoring of blood work.   Counseling on psoriasis and coordination of care  psoriasis is a chronic non-curable, but treatable genetic/hereditary disease that may have other systemic features affecting other organ systems such as joints (Psoriatic Arthritis). It is associated with an increased risk of inflammatory bowel disease, heart disease, non-alcoholic fatty liver disease, and depression.  Treatments include light and laser treatments; topical medications; and systemic medications including oral and injectables.  PILONIDAL CYST Exam:  Subcutaneous nodule with punctum at superior gluteal crease Treatment Plan: Benign-appearing. Exam most consistent with a pilonidal cyst. Discussed that these can grow over time and sometimes sometimes get irritated or inflamed. Discussed pilonidal cysts often have a sinus tract that can go deeper. Recommend evaluation with general surgery for possible excision if it becomes bothersome. Recommend repeat evaluation if it changes.    Start Clindamycin solution QD.   Return in about 6 months (around 06/28/2023) for psoriasis follow up .  Maylene Roes, CMA, am acting as scribe for Armida Sans, MD .  Documentation: I have reviewed the above documentation for accuracy and completeness, and I agree with the  above.  Armida Sans, MD

## 2022-12-27 ENCOUNTER — Ambulatory Visit: Payer: Medicare Other | Admitting: Family Medicine

## 2022-12-27 ENCOUNTER — Encounter: Payer: Self-pay | Admitting: Family Medicine

## 2022-12-27 ENCOUNTER — Telehealth: Payer: Self-pay

## 2022-12-27 LAB — CBC WITH DIFFERENTIAL/PLATELET
Basophils Absolute: 0.1 10*3/uL (ref 0.0–0.2)
Basos: 1 %
EOS (ABSOLUTE): 0.3 10*3/uL (ref 0.0–0.4)
Eos: 3 %
Hematocrit: 45.8 % (ref 37.5–51.0)
Hemoglobin: 15.4 g/dL (ref 13.0–17.7)
Immature Grans (Abs): 0 10*3/uL (ref 0.0–0.1)
Immature Granulocytes: 0 %
Lymphocytes Absolute: 3 10*3/uL (ref 0.7–3.1)
Lymphs: 33 %
MCH: 29.8 pg (ref 26.6–33.0)
MCHC: 33.6 g/dL (ref 31.5–35.7)
MCV: 89 fL (ref 79–97)
Monocytes Absolute: 0.7 10*3/uL (ref 0.1–0.9)
Monocytes: 8 %
Neutrophils Absolute: 5 10*3/uL (ref 1.4–7.0)
Neutrophils: 55 %
Platelets: 364 10*3/uL (ref 150–450)
RBC: 5.16 x10E6/uL (ref 4.14–5.80)
RDW: 12.2 % (ref 11.6–15.4)
WBC: 9.1 10*3/uL (ref 3.4–10.8)

## 2022-12-27 NOTE — Telephone Encounter (Signed)
-----   Message from Louis Carney sent at 12/27/2022  1:11 PM EDT ----- Blood counts from 12/26/2022 were all OK / NORMAL Continue Skyrizi for Psoriasis Keep follow up appt

## 2022-12-27 NOTE — Progress Notes (Deleted)
Established Patient Office Visit  Subjective   Patient ID: Louis Carney, male    DOB: 1980/10/03  Age: 42 y.o. MRN: [id number]  No chief complaint on file.   HPI  Psoriasis-taking Skyrizi, clobetasol  Hypertriglyceridemia-statin  DM-Ozempic  Erectile dysfunction-Viagra   The 10-year ASCVD risk score (Arnett DK, et al., 2019) is: 21.5%  Health Maintenance Due  Topic Date Due   COVID-19 Vaccine (1) Never done   DTaP/Tdap/Td (1 - Tdap) Never done   OPHTHALMOLOGY EXAM  01/29/2020   INFLUENZA VACCINE  11/29/2022      Objective:     There were no vitals taken for this visit. {Vitals History (Optional):23777}  Physical Exam   No results found for any visits on 12/27/22.      Assessment & Plan:   There are no diagnoses linked to this encounter.   No follow-ups on file.    Sandre Kitty, MD

## 2022-12-27 NOTE — Telephone Encounter (Signed)
Left voicemail to return my call

## 2022-12-28 ENCOUNTER — Encounter: Payer: Self-pay | Admitting: Dermatology

## 2023-01-01 ENCOUNTER — Telehealth: Payer: Self-pay

## 2023-01-01 NOTE — Telephone Encounter (Signed)
-----   Message from Armida Sans sent at 12/27/2022  1:11 PM EDT ----- Blood counts from 12/26/2022 were all OK / NORMAL Continue Skyrizi for Psoriasis Keep follow up appt

## 2023-01-01 NOTE — Telephone Encounter (Signed)
Left voicemail to return my call

## 2023-01-02 ENCOUNTER — Telehealth: Payer: Self-pay

## 2023-01-02 NOTE — Telephone Encounter (Signed)
Patient informed of lab results. 

## 2023-01-02 NOTE — Telephone Encounter (Signed)
-----   Message from Armida Sans sent at 12/27/2022  1:11 PM EDT ----- Blood counts from 12/26/2022 were all OK / NORMAL Continue Skyrizi for Psoriasis Keep follow up appt

## 2023-01-08 ENCOUNTER — Ambulatory Visit (INDEPENDENT_AMBULATORY_CARE_PROVIDER_SITE_OTHER): Payer: Medicare Other | Admitting: Family Medicine

## 2023-01-08 VITALS — BP 138/85 | HR 82 | Ht 68.0 in | Wt 203.0 lb

## 2023-01-08 DIAGNOSIS — H0014 Chalazion left upper eyelid: Secondary | ICD-10-CM | POA: Diagnosis not present

## 2023-01-08 DIAGNOSIS — E1159 Type 2 diabetes mellitus with other circulatory complications: Secondary | ICD-10-CM

## 2023-01-08 DIAGNOSIS — J320 Chronic maxillary sinusitis: Secondary | ICD-10-CM | POA: Diagnosis not present

## 2023-01-08 DIAGNOSIS — R5383 Other fatigue: Secondary | ICD-10-CM

## 2023-01-08 DIAGNOSIS — Z7984 Long term (current) use of oral hypoglycemic drugs: Secondary | ICD-10-CM

## 2023-01-08 DIAGNOSIS — I152 Hypertension secondary to endocrine disorders: Secondary | ICD-10-CM

## 2023-01-08 MED ORDER — OLMESARTAN-AMLODIPINE-HCTZ 40-10-12.5 MG PO TABS: 1.0000 | ORAL_TABLET | Freq: Every day | ORAL | 1 refills | Status: DC

## 2023-01-08 MED ORDER — CEFDINIR 300 MG PO CAPS: 300.0000 mg | ORAL_CAPSULE | Freq: Two times a day (BID) | ORAL | 0 refills | Status: AC

## 2023-01-08 NOTE — Assessment & Plan Note (Signed)
Patient had previously been referred to ENT and was provided steroid and antibiotics which improved symptoms but symptoms eventually returned.  Because of the chronic and recurring nature he may need procedure such as balloon sinuplasty.  Advised patient to reach out to the ENT he previously had seen.  Prescribed 10 days of twice daily cefdinir.

## 2023-01-08 NOTE — Patient Instructions (Addendum)
It was nice to see you today,  We addressed the following topics today: I have put in future lab orders to check for your testosterone and TSH levels to evaluate your fatigue.  I would also consider doing a sleep study for diagnosing obstructive sleep apnea - I will send in a referral to the ophthalmologist to evaluate your eye.  This person can also do diabetic retinopathy screenings in the future. - You should reach out to the ear nose and throat doctor who previously evaluated you and be reevaluated for recurrent or chronic bacterial sinusitis. - I will also send in a 10-day prescription for an antibiotic for your current sinus infection - I have changed your 2 blood pressure medications to a combination blood pressure medication that also includes hydrochlorothiazide. - I would like to see back in 1 month  Have a great day,  Frederic Jericho, MD

## 2023-01-08 NOTE — Assessment & Plan Note (Signed)
Blood pressure elevated today.  Will switch his olmesartan and amlodipine to a combination pill including olmesartan/amlodipine/HCTZ.  Follow-up in 1 month.

## 2023-01-08 NOTE — Progress Notes (Unsigned)
Established Patient Office Visit  Subjective   Patient ID: Louis Carney, male    DOB: [birth year]  Age: 42 y.o. MRN: [id number]  Chief Complaint  Patient presents with   Medical Management of Chronic Issues    HPI  Psoriasis-taking Skyrizi, clobetasol  Hypertriglyceridemia-statin  DM-Ozempic  Erectile dysfunction-Viagra  HTN - 146/99  Eye -patient states that for the past year he has noticed drainage coming out of the left eye which she describes as "white stuff" he has been to the ear nose and throat doctor and was prescribed a steroid and an antibiotic which improved his sinus infection and discharge but it eventually returned.  He more recently has an issue with the upper eyelid of the left side also claiming discharge from there.  He states his wife will sometimes take a Q-tip and remove stringy mucus like discharge from the upper eyelid.  It is also painful like he has something in his eye.  No double vision or other vision changes.  He does not have a regular optometrist or ophthalmologist.  Fatigue-patient states that recently in the mid afternoon he has felt fatigued around 3 to 5 PM.  Has no morning sleepiness.  Does state that he has been told he snores and has apneic episodes by his wife.  Denies headache.  Has never been evaluated for sleep study.    The 10-year ASCVD risk score (Arnett DK, et al., 2019) is: 19.4%  Health Maintenance Due  Topic Date Due   COVID-19 Vaccine (1) Never done   DTaP/Tdap/Td (1 - Tdap) Never done   OPHTHALMOLOGY EXAM  01/29/2020      Objective:     BP 138/85   Pulse 82   Ht 5\' 8"  (1.727 m)   Wt 203 lb (92.1 kg)   SpO2 96%   BMI 30.87 kg/m  {Vitals History (Optional):23777}  Physical Exam General: Alert, oriented HEENT: Tenderness over the left maxillary sinus.  No visible discharge seen coming from the left eye or eyelids.  The interior upper left eyelid was negative for foreign body but does appear to have an area of  swelling.   No results found for any visits on 01/08/23.      Assessment & Plan:   Chalazion of left upper eyelid Assessment & Plan: Chronic issue causing pain/irritation.  Has been using warm compresses at home.  Endorses reported discharge but none visualized on exam today.  Given the duration of his symptoms will refer to ophthalmology  Orders: -     Ambulatory referral to Ophthalmology  Fatigue, unspecified type Assessment & Plan: Complains of chronic midday fatigue.  Will check thyroid and testosterone.  Consider sleep study as well.  Orders: -     Testosterone; Future -     TSH; Future  Chronic maxillary sinusitis Assessment & Plan: Patient had previously been referred to ENT and was provided steroid and antibiotics which improved symptoms but symptoms eventually returned.  Because of the chronic and recurring nature he may need procedure such as balloon sinuplasty.  Advised patient to reach out to the ENT he previously had seen.  Prescribed 10 days of twice daily cefdinir.   Hypertension associated with diabetes (HCC) Assessment & Plan: Blood pressure elevated today.  Will switch his olmesartan and amlodipine to a combination pill including olmesartan/amlodipine/HCTZ.  Follow-up in 1 month.   Other orders -     Cefdinir; Take 1 capsule (300 mg total) by mouth 2 (two) times daily for 10 days.  Dispense: 20 capsule; Refill: 0 -     Olmesartan-amLODIPine-HCTZ; Take 1 tablet by mouth daily.  Dispense: 90 tablet; Refill: 1     Return in about 4 weeks (around 02/05/2023) for HTN.    Sandre Kitty, MD

## 2023-01-08 NOTE — Assessment & Plan Note (Signed)
Complains of chronic midday fatigue.  Will check thyroid and testosterone.  Consider sleep study as well.

## 2023-01-08 NOTE — Assessment & Plan Note (Signed)
Chronic issue causing pain/irritation.  Has been using warm compresses at home.  Endorses reported discharge but none visualized on exam today.  Given the duration of his symptoms will refer to ophthalmology

## 2023-01-15 ENCOUNTER — Other Ambulatory Visit: Payer: Medicare Other

## 2023-01-15 DIAGNOSIS — R5383 Other fatigue: Secondary | ICD-10-CM | POA: Diagnosis not present

## 2023-02-05 ENCOUNTER — Encounter: Payer: Self-pay | Admitting: Family Medicine

## 2023-02-05 ENCOUNTER — Ambulatory Visit (INDEPENDENT_AMBULATORY_CARE_PROVIDER_SITE_OTHER): Payer: Medicare Other | Admitting: Family Medicine

## 2023-02-05 VITALS — BP 121/79 | HR 78 | Ht 68.0 in | Wt 209.0 lb

## 2023-02-05 DIAGNOSIS — G8929 Other chronic pain: Secondary | ICD-10-CM

## 2023-02-05 DIAGNOSIS — Z794 Long term (current) use of insulin: Secondary | ICD-10-CM | POA: Diagnosis not present

## 2023-02-05 DIAGNOSIS — M5416 Radiculopathy, lumbar region: Secondary | ICD-10-CM

## 2023-02-05 DIAGNOSIS — G5601 Carpal tunnel syndrome, right upper limb: Secondary | ICD-10-CM | POA: Diagnosis not present

## 2023-02-05 DIAGNOSIS — E1159 Type 2 diabetes mellitus with other circulatory complications: Secondary | ICD-10-CM | POA: Diagnosis not present

## 2023-02-05 DIAGNOSIS — E1165 Type 2 diabetes mellitus with hyperglycemia: Secondary | ICD-10-CM | POA: Diagnosis not present

## 2023-02-05 DIAGNOSIS — I152 Hypertension secondary to endocrine disorders: Secondary | ICD-10-CM

## 2023-02-05 MED ORDER — CELECOXIB 200 MG PO CAPS: 200.0000 mg | ORAL_CAPSULE | Freq: Two times a day (BID) | ORAL | 2 refills | Status: DC

## 2023-02-05 NOTE — Progress Notes (Unsigned)
   Established Patient Office Visit  Subjective   Patient ID: Louis Carney, male    DOB: [birth year]  Age: 42 y.o. MRN: [id number]  No chief complaint on file.   HPI  Hypertension-patient states he was not able to pick up the combination pill we prescribed last time.  He has continued to take his his previous 2 medications.  Blood pressure has been good during this time.  We discussed leaving it as is since his blood pressure is better without a change in medication.  Diabetes-patient states that they are taking them off of everything except Ozempic because his blood glucose has been so well-controlled.  Back pain-patient has been to [facility] in the past.  Had a discectomy and right sided carpal tunnel release (both apparently at the same time).  States he has not been back there and over 2 years.  He would like to see a different neurosurgeon if possible.  He was seeing Dr. Franky Macho over there.  Still has significant pain in the wrist of the right hand where the surgery was done.  States he cannot hold a toothbrush because of the pain associated with squeezing.  Patient's back pain is so significant he cannot sleep on his back.  He has tried many different treatment options, none of which have been adequate in controlling his pain.  Currently taking ibuprofen multiple times a day.  Discussed alternatives to ibuprofen.  Fatigue-we discussed patient's normal testosterone thyroid level.  Discussed having a sleep study for his sleep apnea symptoms.  Offered referral to sleep study.  Discussed options for treatment including CPAP, muscular, inspire implant.  Advised him all of these would likely require positive sleep study first.  Patient going to the ophthalmologist later this week.  States that the medication prescribed last visit is helping with his sinus drainage.    Neck stent.  Cervical laminectomy  Fatigue-testosterone and TSH were normal.  Sleep study?  Ophtoh end of  this week.     The 10-year ASCVD risk score (Arnett DK, et al., 2019) is: 19.4%  Health Maintenance Due  Topic Date Due   COVID-19 Vaccine (1) Never done   DTaP/Tdap/Td (1 - Tdap) Never done   OPHTHALMOLOGY EXAM  01/29/2020      Objective:     There were no vitals taken for this visit. {Vitals History (Optional):23777}  Physical Exam   No results found for any visits on 02/05/23.      Assessment & Plan:   There are no diagnoses linked to this encounter.   No follow-ups on file.    Sandre Kitty, MD

## 2023-02-05 NOTE — Patient Instructions (Addendum)
It was nice to see you today,  We addressed the following topics today: -Continue taking your medications for blood pressure.  Your blood pressure is fine today on the 2 medications you have been taking in the past.  We will hold off on adding the third medication since your insurance does not cover it. - I will look into a referral to a different neurosurgery office. - I have sent in a prescription for Celebrex.  Take this instead of your ibuprofen.  Also you should not be taking Advil, Aleve, Goody's or BC powders when taking these. - If you decide to get a sleep study let us know.  Sleep apnea is likely the main cause of your fatigue.  Have a great day,  Frederic Jericho, MD

## 2023-02-07 DIAGNOSIS — H04123 Dry eye syndrome of bilateral lacrimal glands: Secondary | ICD-10-CM | POA: Diagnosis not present

## 2023-02-07 DIAGNOSIS — H10403 Unspecified chronic conjunctivitis, bilateral: Secondary | ICD-10-CM | POA: Diagnosis not present

## 2023-02-07 DIAGNOSIS — G5601 Carpal tunnel syndrome, right upper limb: Secondary | ICD-10-CM | POA: Insufficient documentation

## 2023-02-07 LAB — HM DIABETES EYE EXAM

## 2023-02-07 NOTE — Assessment & Plan Note (Addendum)
Sees endocrinology.  Currently on Ozempic only.  Blood sugar well-controlled.  No changes to treatment today.

## 2023-02-07 NOTE — Assessment & Plan Note (Addendum)
Patient had lumbar discectomy in 2019 with Dr. Franky Macho at [facility] spine and neurosurgery.  Other than the op note for right sided L5/S1 microdiscectomy I cannot see any of his other notes regarding this.  Patient has not followed up with them in 2 years.  He would like a new referral to a different neurosurgery practice.  Will obtain records from Dr. Jackelyn Knife office and then we can send in a new neurosurgery referral. - Also prescribed Celebrex to use instead of other NSAIDs.  Patient is a chronic OTC NSAID user despite warnings/counseling regarding chronic NSAID use.

## 2023-02-07 NOTE — Assessment & Plan Note (Signed)
Patient reports that when he had his microdiscectomy the surgeon also performed a carpal tunnel release on the right side.  Chart review shows that carpal tunnel release occurred on 01/15/2018 and microdiscectomy occurred in November 2019.  Patient still has significant pain with flexion of the right hand.  Interferes with his ability to grip.  Sending in records request to Dr. Jackelyn Knife office.

## 2023-02-07 NOTE — Assessment & Plan Note (Signed)
Patient never was able to get his medication we prescribed last visit because it was not covered by insurance.  Has been taking his previous medications since then.  Blood pressure was at goal today.  States he has had similar readings at home.  Will just continue on with his previous blood pressure medications.

## 2023-02-19 ENCOUNTER — Other Ambulatory Visit: Payer: Self-pay | Admitting: Nurse Practitioner

## 2023-02-19 DIAGNOSIS — N529 Male erectile dysfunction, unspecified: Secondary | ICD-10-CM

## 2023-02-19 NOTE — Telephone Encounter (Signed)
Good morning. You are listed as this patient's PCP now.  -Herbert Seta

## 2023-02-28 ENCOUNTER — Encounter: Payer: Medicare Other | Admitting: Internal Medicine

## 2023-02-28 NOTE — Progress Notes (Deleted)
Office Visit Note  Patient: Louis Carney             Date of Birth: 14-May-1980           MRN: [id number]             PCP: Sandre Kitty, MD Referring: Deirdre Evener, MD Visit Date: 02/28/2023 Occupation: @GUAROCC @  Subjective:  No chief complaint on file.   History of Present Illness: Louis Carney is a 42 y.o. male here for evaluation and management of psoriatic arthritis. He currently sees Dr. Gwen Pounds on treatment with Cristy Folks and topical clobetasol. ***     Activities of Daily Living:  Patient reports morning stiffness for *** {minute/hour:19697}.   Patient {ACTIONS;DENIES/REPORTS:21021675::"Denies"} nocturnal pain.  Difficulty dressing/grooming: {ACTIONS;DENIES/REPORTS:21021675::"Denies"} Difficulty climbing stairs: {ACTIONS;DENIES/REPORTS:21021675::"Denies"} Difficulty getting out of chair: {ACTIONS;DENIES/REPORTS:21021675::"Denies"} Difficulty using hands for taps, buttons, cutlery, and/or writing: {ACTIONS;DENIES/REPORTS:21021675::"Denies"}  No Rheumatology ROS completed.   PMFS History:  Patient Active Problem List   Diagnosis Date Noted   Carpal tunnel syndrome of right wrist 02/07/2023   Chalazion of left upper eyelid 01/08/2023   Chronic sinusitis 09/25/2022   Allergic rhinitis due to animal hair and dander 09/25/2022   Diabetes mellitus (HCC) 05/28/2019   Morbid obesity (HCC) 05/28/2019   Alcoholic hepatitis without ascites 05/28/2019   Alcohol abuse with alcohol-induced disorder (HCC) 02/07/2019   Adjustment disorder with mixed anxiety and depressed mood 02/05/2019   Hepatocellular damage 11/07/2018   Alcohol abuse 09/09/2018   Ulnar neuropathy 06/03/2018   HNP (herniated nucleus pulposus), lumbar 03/07/2018   Medication monitoring encounter 11/29/2017   Spinal stenosis of lumbar region 10/01/2017   Cervical spondylosis with myelopathy 10/01/2017   High risk medication use 09/12/2017   Vitamin D deficiency 09/12/2017   Hyperlipidemia associated  with type 2 diabetes mellitus (HCC) 09/12/2017   Low level of high density lipoprotein (HDL) 09/12/2017   Hypertriglyceridemia 09/12/2017   Chronic radicular low back pain- R sided 08/26/2017   History of excision of lamina of cervical vertebra for decompression of sp- C4-6inal cord 08/26/2017   Neuropathy due to medical condition (HCC)-C8-T1 nerve distribution bilateral upper extremity, 08/26/2017   RSD (reflex sympathetic dystrophy)- right entire leg painful to non-noxious stimuli 08/26/2017   Cigarette smoker- 3 ppd for 6 yrs, then 1ppd for 9 yrs- 27 pk yr hx 08/26/2017   Drinks beer- 6-8 beers/d on ave ( 10 Yrs or so) 08/26/2017   Hypertension associated with diabetes (HCC) 08/26/2017   NSAID long-term use-4 ibuprofen 3 times daily times at least 5 years 08/26/2017   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 08/26/2017   Fatigue 08/26/2017   Mood disorder (HCC)- secondary to chronic pain syndrome 08/26/2017   Insomnia 08/26/2017   Environmental and seasonal allergies 08/26/2017   ED (erectile dysfunction) 08/26/2017   Allergic conjunctivitis of both eyes and rhinitis 08/26/2017   Class 1 obesity 08/26/2017   Plaque psoriasis 08/26/2017    Past Medical History:  Diagnosis Date   Arthritis    Diabetes (HCC)    type 1   GERD (gastroesophageal reflux disease)    Headache    migraines   Hypertension    Psoriasis    since age 19 years   TB lung, latent 11/06/2017   Transaminitis 01/29/2018   Tuberculosis    latent TB infection on treatment    Family History  Problem Relation Age of Onset   Alcohol abuse Mother    Hypertension Mother    Lung cancer Mother  Alcohol abuse Father    Hypertension Father    Colon cancer Father        dx in his late 44's   Esophageal cancer Neg Hx    Rectal cancer Neg Hx    Past Surgical History:  Procedure Laterality Date   CARPAL TUNNEL RELEASE Right 01/15/2018   Procedure: Right Carpal tunnel release;  Surgeon: Coletta Memos, MD;  [facility];  Service: Neurosurgery;  Laterality: Right;  Right Carpal tunnel release   CERVICAL SPINE SURGERY     LUMBAR LAMINECTOMY/DECOMPRESSION MICRODISCECTOMY Right 03/07/2018   Procedure: Right Lumbar 5 Sacral 1 Microdiscectomy;  Surgeon: Coletta Memos, MD;  Location: [facility] OR;  Service: Neurosurgery;  Laterality: Right;  Right Lumbar 5 Sacral 1 Microdiscectomy   NECK SURGERY     Social History   Social History Narrative   Married, 3 biologic children 2 stepchildren    disabled due to back pain   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   Objective: Vital Signs: There were no vitals taken for this visit.   Physical Exam   Musculoskeletal Exam: ***  CDAI Exam: CDAI Score: -- Patient Global: --; Provider Global: -- Swollen: --; Tender: -- Joint Exam 02/28/2023   No joint exam has been documented for this visit   There is currently no information documented on the homunculus. Go to the Rheumatology activity and complete the homunculus joint exam.  Investigation: No additional findings.  Imaging: No results found.  Recent Labs: Lab Results  Component Value Date   WBC 9.1 12/26/2022   HGB 15.4 12/26/2022   PLT 364 12/26/2022   NA 137 12/19/2022   K 4.6 12/19/2022   CL 99 12/19/2022   CO2 21 12/19/2022   GLUCOSE 104 (H) 12/19/2022   BUN 9 12/19/2022   CREATININE 0.74 (L) 12/19/2022   BILITOT 0.6 12/19/2022   ALKPHOS 63 12/19/2022   AST 18 12/19/2022   ALT 26 12/19/2022   PROT 6.7 12/19/2022   ALBUMIN 4.0 (L) 12/19/2022   CALCIUM 9.1 12/19/2022   GFRAA >60 12/25/2019    Speciality Comments: No specialty comments available.  Procedures:  No procedures performed Allergies: Patient has no known allergies.   Assessment / Plan:     Visit Diagnoses: No diagnosis found.  Orders: No  orders of the defined types were placed in this encounter.  No orders of the defined types were placed in this encounter.   Face-to-face time spent with patient was *** minutes. Greater than 50% of time was spent in counseling and coordination of care.  Follow-Up Instructions: No follow-ups on file.   Fuller Plan, MD  Note - This record has been created using [employer].  Chart creation errors have been sought, but may not always  have been located. Such creation errors do not reflect on  the standard of medical care.

## 2023-03-11 ENCOUNTER — Encounter: Payer: Self-pay | Admitting: Internal Medicine

## 2023-03-11 ENCOUNTER — Ambulatory Visit (INDEPENDENT_AMBULATORY_CARE_PROVIDER_SITE_OTHER): Payer: Medicare Other | Admitting: Internal Medicine

## 2023-03-11 VITALS — BP 126/70 | HR 86 | Ht 68.0 in | Wt 207.0 lb

## 2023-03-11 DIAGNOSIS — E1165 Type 2 diabetes mellitus with hyperglycemia: Secondary | ICD-10-CM

## 2023-03-11 DIAGNOSIS — E66811 Obesity, class 1: Secondary | ICD-10-CM

## 2023-03-11 DIAGNOSIS — Z794 Long term (current) use of insulin: Secondary | ICD-10-CM | POA: Diagnosis not present

## 2023-03-11 DIAGNOSIS — E782 Mixed hyperlipidemia: Secondary | ICD-10-CM

## 2023-03-11 LAB — POCT GLYCOSYLATED HEMOGLOBIN (HGB A1C): Hemoglobin A1C: 5.7 % — AB (ref 4.0–5.6)

## 2023-03-11 NOTE — Progress Notes (Addendum)
Patient ID: Louis Carney, male   DOB: 29-Jan-1981, 42 y.o.   MRN: [id number]   HPI: Louis Carney is a 42 y.o.-year-old male, initially referred by his PCP, Dr. Sharee Holster, returning for follow-up for DM2, dx in 09/2018, non-insulin-dependent, uncontrolled, without long-term complications.  Last visit 6 months ago.  He is not usually compliant with appointments.  Interim history: No increased urination, blurry vision, nausea, chest pain.  Problems with left eye discharge since last visit.  She saw ENT and was told he had sinusitis.  She saw ophthalmology (Dr. Dione Booze) with he tells me that he could not have the eye exam yet until he gets a sleep study.  Reviewed history: He had dizziness, blurry vision >> checked sugar with neighbor's meter >> CBG high (HI, then rechecked: 540).  He saw PCP >> HbA1c was high x2 >> added insulin.  He felt much better after adding insulin.  However, sugars were still extremely high at last visit so we added rapid acting insulin and I made specific suggestions about his diet and alcohol intake.  Reviewed HbA1c levels: Lab Results  Component Value Date   HGBA1C 5.6 12/19/2022   HGBA1C 5.3 09/06/2022   HGBA1C 5.6 06/27/2022   HGBA1C 5.2 01/16/2022   HGBA1C 5.5 09/04/2021   HGBA1C 5.5 02/20/2021   HGBA1C 8.3 (A) 07/22/2020   HGBA1C 6.4 (A) 01/18/2020   HGBA1C 6.2 (A) 08/21/2019   HGBA1C 6.0 (A) 01/20/2019   Currently on: - Ozempic 0.5 >> 1 mg weekly  He was previously on NovoLog but stopped around 10/2020 after he ran out. He was previously on Lantus 25 units twice a day, but then reduced and stopped at the beginning of 2023.  Pt checks his sugars once a day: - am: 78-110 >> 88-120 >> 98-111 >> 106-132 >> 80-121 - 2h after b'fast: n/c - before lunch: 300-340 >> .Marland KitchenMarland Kitchen 140s >> 97-126 >> n/c - 2h after lunch: n/c - before dinner: 340 >> 128-132 >> n/c - 2h after dinner: n/c - bedtime:  80s-123 >> 90s-110s >> 100-130s >> n/c - nighttime: n/c Lowest sugar was 78  >> 88 >> 98 >> 100 >> 80; it is unclear at which CBG level he has hypoglycemia awareness. Highest sugar was 460 ...>> 123 >> 110s >> 130s >> 120.  Glucometer:ReliOn  Pt's meals are: - Breakfast: skips - Lunch: [place] sandwich  - Dinner: lasagna, hamburger, steak + potatoes, pasta - Snacks:cookies, chips. He does not like greens. In the past, he was drinking 4-6 shots of vodka, 6 pack beer EVERY night >> then 5 Bud Light beers a night >> then liquor/vodka >> stopped.  -No CKD.  Last BUN/creatinine:  Lab Results  Component Value Date   BUN 9 12/19/2022   BUN 11 09/06/2022   CREATININE 0.74 (L) 12/19/2022   CREATININE 0.79 09/06/2022   Lab Results  Component Value Date   MICRALBCREAT 5.0 09/06/2022   MICRALBCREAT 4.6 02/20/2021   MICRALBCREAT 57 (H) 10/27/2018  On olmesartan.  -He had significant hypertriglyceridemia, which improved at last check; last set of lipids: Lab Results  Component Value Date   CHOL 163 12/19/2022   HDL 25 (L) 12/19/2022   LDLCALC 97 12/19/2022   LDLDIRECT 127.0 09/06/2022   TRIG 237 (H) 12/19/2022   CHOLHDL 6.5 (H) 12/19/2022   On Crestor 20 << 10 mg daily.  He has a history of transaminitis.  Latest liver tests reviewed: Lab Results  Component Value Date   ALT 26 12/19/2022   AST  18 12/19/2022   ALKPHOS 63 12/19/2022   BILITOT 0.6 12/19/2022  He sees Dr. Leone Payor with [facility].    - last eye exam was in 2021: No DR reportedly. He saw ophthalmology (Dr. Dione Booze) >> will have an eye exam in 2 mo.  -+ numbness and tingling in his R leg - from back pain - see Dr. Mikal Plane.  Last foot exam was 09/06/2022 here in clinic.  Pt has FH of DM in father - type 2 DM. + significant alcoholism FH.  He is on Risankizumab in 04/2018 for psoriasis >> working. Prev. Embrel, Humira, etc. his psoriasis is much improved on meds. No history of pancreatitis or personal or family history of medullary thyroid cancer. He has sinusitis >> saw ENT.  ROS: + See HPI +  Back pain -PCP previously referred him to pain clinic. + Rash on hands (psoriasis) >> much improved >> now seeing [facility] - on Skyrizi  I reviewed pt's medications, allergies, PMH, social hx, family hx, and changes were documented in the history of present illness. Otherwise, unchanged from my initial visit note.  Past Medical History:  Diagnosis Date   Arthritis    Diabetes (HCC)    type 1   GERD (gastroesophageal reflux disease)    Headache    migraines   Hypertension    Psoriasis    since age 33 years   TB lung, latent 11/06/2017   Transaminitis 01/29/2018   Tuberculosis    latent TB infection on treatment   Past Surgical History:  Procedure Laterality Date   CARPAL TUNNEL RELEASE Right 01/15/2018   Procedure: Right Carpal tunnel release;  Surgeon: Coletta Memos, MD;  Location: [facility] OR;  Service: Neurosurgery;  Laterality: Right;  Right Carpal tunnel release   CERVICAL SPINE SURGERY     LUMBAR LAMINECTOMY/DECOMPRESSION MICRODISCECTOMY Right 03/07/2018   Procedure: Right Lumbar 5 Sacral 1 Microdiscectomy;  Surgeon: Coletta Memos, MD;  Location: [facility] OR;  Service: Neurosurgery;  Laterality: Right;  Right Lumbar 5 Sacral 1 Microdiscectomy   NECK SURGERY     Social History   Socioeconomic History   Marital status: Married    Spouse name: Not on file   Number of children: 5   Years of education: Not on file   Highest education level: 11th grade  Occupational History   Occupation: disabled  Tobacco Use   Smoking status: Every Day    Current packs/day: 1.00    Average packs/day: 1 pack/day for 15.0 years (15.0 ttl pk-yrs)    Types: Cigarettes   Smokeless tobacco: Never  Vaping Use   Vaping status: Never Used  Substance and Sexual Activity   Alcohol use: Yes    Alcohol/week: 24.0 standard drinks of alcohol    Types: 24 Standard drinks or equivalent per week   Drug use: Never   Sexual activity: Yes    Partners: Female    Birth control/protection: None  Other  Topics Concern   Not on file  Social History Narrative   Married, 3 biologic children 2 stepchildren    disabled due to back pain   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   6-8 beers nightly   Smoker   No drug use   Social Determinants of Health   Financial Resource Strain: Medium Risk (01/08/2023)   Overall Financial Resource Strain (CARDIA)    Difficulty of Paying Living Expenses: Somewhat hard  Food Insecurity: No Food Insecurity (01/08/2023)   Hunger Vital  Sign    Worried About Programme researcher, broadcasting/film/video in the Last Year: Never true    Ran Out of Food in the Last Year: Never true  Transportation Needs: No Transportation Needs (01/08/2023)   PRAPARE - Administrator, Civil Service (Medical): No    Lack of Transportation (Non-Medical): No  Physical Activity: Inactive (01/08/2023)   Exercise Vital Sign    Days of Exercise per Week: 0 days    Minutes of Exercise per Session: 0 min  Stress: Stress Concern Present (01/08/2023)   [employer] of Occupational Health - Occupational Stress Questionnaire    Feeling of Stress : Very much  Social Connections: Moderately Isolated (01/08/2023)   Social Connection and Isolation Panel [NHANES]    Frequency of Communication with Friends and Family: Once a week    Frequency of Social Gatherings with Friends and Family: More than three times a week    Attends Religious Services: Never    Database administrator or Organizations: No    Attends Banker Meetings: Never    Marital Status: Married  Catering manager Violence: Not At Risk (08/23/2022)   Humiliation, Afraid, Rape, and Kick questionnaire    Fear of Current or Ex-Partner: No    Emotionally Abused: No    Physically Abused: No    Sexually Abused: No   Current Outpatient Medications on File Prior to Visit  Medication Sig Dispense Refill   albuterol (VENTOLIN HFA) 108 (90 Base) MCG/ACT inhaler Inhale 2 puffs into  the lungs every 6 (six) hours as needed for wheezing or shortness of breath. 8 g 2   azelastine (ASTELIN) 0.1 % nasal spray Place 2 sprays into both nostrils 2 (two) times daily. Use in each nostril as directed 30 mL 5   blood glucose meter kit and supplies Dispense based on patient and insurance preference. Use to check glucose level three times daily before meals and 3 times daily 2 hours after meals (6 times daily total). (FOR ICD-10 E10.9, E11.9). 1 each 0   celecoxib (CELEBREX) 200 MG capsule Take 1 capsule (200 mg total) by mouth 2 (two) times daily. 60 capsule 2   clindamycin (CLEOCIN T) 1 % external solution Apply to aa QD. 60 mL 5   clobetasol cream (TEMOVATE) 0.05 % Apply to aa psoriasis QD 3d/wk. Avoid applying to face, groin, and axilla. Use as directed. Long-term use can cause thinning of the skin. 60 g 1   Clobetasol Prop Emollient Base (CLOBETASOL PROPIONATE E) 0.05 % emollient cream Apply 1 application topically daily. 60 g 5   esomeprazole (NEXIUM) 20 MG capsule Take 20 mg by mouth daily at 12 noon.     metoprolol succinate (TOPROL-XL) 100 MG 24 hr tablet Take 1 tablet (100 mg total) by mouth daily. Take with or immediately following a meal. 90 tablet 1   montelukast (SINGULAIR) 10 MG tablet Take 1 tablet (10 mg total) by mouth at bedtime. 30 tablet 3   Olmesartan-amLODIPine-HCTZ 40-10-12.5 MG TABS Take 1 tablet by mouth daily. 90 tablet 1   ondansetron (ZOFRAN) 4 MG tablet Take 2 tablets (8 mg total) by mouth every 8 (eight) hours as needed for nausea or vomiting. 12 tablet 0   OneTouch Delica Lancets 33G MISC USE TO TEST BLOOD SUGARS 6 TIMES DAILY (BEFORE MEALS AND 2 HOURS AFTER MEALS) 200 each 0   ONETOUCH VERIO test strip USE TO TEST 6 TIMES DAILY (BEFORE MEALS AND 2 HOURS AFTER MEALS) 200 each 0  risankizumab-rzaa (SKYRIZI PEN) 150 MG/ML pen Inject 1 mL (150 mg total) into the skin as directed. Every 12 weeks for maintenance. 1 mL 1   rosuvastatin (CRESTOR) 20 MG tablet Take 1  tablet (20 mg total) by mouth daily. 90 tablet 3   Semaglutide, 1 MG/DOSE, (OZEMPIC, 1 MG/DOSE,) 4 MG/3ML SOPN Inject 1 mg into the skin once a week. 9 mL 3   sildenafil (VIAGRA) 100 MG tablet TAKE 1/2 (ONE-HALF) TABLET BY MOUTH 30 MINUTES PRIOR TO INTERCOURSE AS NEEDED 30 tablet 0   No current facility-administered medications on file prior to visit.   No Known Allergies Family History  Problem Relation Age of Onset   Alcohol abuse Mother    Hypertension Mother    Lung cancer Mother    Alcohol abuse Father    Hypertension Father    Colon cancer Father        dx in his late 32's   Esophageal cancer Neg Hx    Rectal cancer Neg Hx    PE: There were no vitals taken for this visit. Wt Readings from Last 10 Encounters:  03/11/23 207 lb (93.9 kg)  02/05/23 209 lb (94.8 kg)  01/08/23 203 lb (92.1 kg)  09/25/22 209 lb (94.8 kg)  09/06/22 211 lb 9.6 oz (96 kg)  08/23/22 210 lb (95.3 kg)  06/27/22 210 lb 12.8 oz (95.6 kg)  03/08/22 207 lb 12.8 oz (94.3 kg)  01/16/22 207 lb (93.9 kg)  09/06/21 207 lb (93.9 kg)   Constitutional: overweight, in NAD Eyes:  EOMI, no exophthalmos ENT: no neck masses, no cervical lymphadenopathy Cardiovascular: RRR, No MRG Respiratory: CTA B Musculoskeletal: no deformities Skin:no rashes Neurological: no tremor with outstretched hands  ASSESSMENT: 1. DM, noninsulin-dependent, without long-term complications -We ruled out antipancreatic autoimmunity and insulin deficiency  Component     Latest Ref Rng 01/20/2019  Glucose, Plasma     65 - 99 mg/dL 161 (H)   C-Peptide     0.80 - 3.85 ng/mL 2.43   Islet Cell Ab     Neg:<1:1  Negative   ZNT8 Antibodies     U/mL <15   Glutamic Acid Decarb Ab     <5 IU/mL <5     2.  Hyperlipidemia (mixed)  3.  Obesity class I  PLAN:  1. Patient with controlled type 2 diabetes, insulin-dependent previously, but coming off insulin after starting to workout at the gym and cutting out alcohol.  At last visit, HbA1c  was excellent, in the normal range, at 5.3%.  He had another HbA1c 3 months ago which was slightly higher, but still at goal, at 5.6%.  We did not change his regimen at last visit as sugars are all at goal. He continues on Ozempic 1 mg weekly, tolerated well. -At today's visit, sugars remain at goal, but he only checks in the morning.  We discussed about checking some sugars later in the day with no changes are needed for his regimen for now. - I suggested to:  Patient Instructions  Please continue: - Ozempic 1 mg weekly   Check some sugars later in the day.  Please return in 6 months.   - we checked his HbA1c: 5.7% (only slightly higher) - advised to check sugars at different times of the day - 1x a day, rotating check times - advised for yearly eye exams >> he is UTD - return to clinic in 6 months  2.  Mixed hyperlipidemia -Reviewed latest lipid panel from 11/2022: LDL  improved at last check, now at goal, triglycerides high, HDL low: Lab Results  Component Value Date   CHOL 163 12/19/2022   HDL 25 (L) 12/19/2022   LDLCALC 97 12/19/2022   LDLDIRECT 127.0 09/06/2022   TRIG 237 (H) 12/19/2022   CHOLHDL 6.5 (H) 12/19/2022  -At last visit, in 08/2022, I suggested to increase Crestor to 20 mg daily.  He tolerates this well.  3.  Obesity class I -We discussed in the past about cutting out alcohol, starches, fatty foods and snacks.  He was also constantly eating between meals and I strongly advised him to try to eliminate snacks but snack on fruit if absolutely needed.  I previously recommended a more plant-based diet and he tried it but was not able to follow weight due to being on the road frequently. -He cut out alcohol in 2023 -He gained 4 pounds before last visit but lost 4 pounds since then -Will continue Ozempic which should also help with weight loss  Carlus Pavlov, MD PhD [facility]

## 2023-03-11 NOTE — Addendum Note (Signed)
Addended by: Pollie Meyer on: 03/11/2023 03:43 PM   Modules accepted: Orders

## 2023-03-11 NOTE — Patient Instructions (Addendum)
Please continue: - Ozempic 1 mg weekly   Check some sugars later in the day.  Please return in 6 months.

## 2023-03-14 ENCOUNTER — Other Ambulatory Visit: Payer: Self-pay | Admitting: Family Medicine

## 2023-03-14 DIAGNOSIS — R5382 Chronic fatigue, unspecified: Secondary | ICD-10-CM

## 2023-03-14 NOTE — Telephone Encounter (Signed)
Patient request referral for sleep study.   Okay to add referral or should patient have an appointment? Please advise at your convenience.

## 2023-03-14 NOTE — Telephone Encounter (Signed)
I'll send in a sleep study order.  I have already talked to him about it in the past.

## 2023-05-08 ENCOUNTER — Ambulatory Visit (HOSPITAL_BASED_OUTPATIENT_CLINIC_OR_DEPARTMENT_OTHER): Payer: Medicare Other | Admitting: Internal Medicine

## 2023-05-08 ENCOUNTER — Ambulatory Visit (INDEPENDENT_AMBULATORY_CARE_PROVIDER_SITE_OTHER): Payer: Medicare Other | Admitting: Family Medicine

## 2023-05-08 VITALS — BP 147/87 | HR 75 | Ht 68.0 in | Wt 207.8 lb

## 2023-05-08 DIAGNOSIS — E1165 Type 2 diabetes mellitus with hyperglycemia: Secondary | ICD-10-CM

## 2023-05-08 DIAGNOSIS — M5416 Radiculopathy, lumbar region: Secondary | ICD-10-CM | POA: Diagnosis not present

## 2023-05-08 DIAGNOSIS — E785 Hyperlipidemia, unspecified: Secondary | ICD-10-CM

## 2023-05-08 DIAGNOSIS — Z9109 Other allergy status, other than to drugs and biological substances: Secondary | ICD-10-CM | POA: Diagnosis not present

## 2023-05-08 DIAGNOSIS — E782 Mixed hyperlipidemia: Secondary | ICD-10-CM

## 2023-05-08 DIAGNOSIS — E1169 Type 2 diabetes mellitus with other specified complication: Secondary | ICD-10-CM | POA: Diagnosis not present

## 2023-05-08 DIAGNOSIS — E781 Pure hyperglyceridemia: Secondary | ICD-10-CM

## 2023-05-08 DIAGNOSIS — G8929 Other chronic pain: Secondary | ICD-10-CM

## 2023-05-08 DIAGNOSIS — Z794 Long term (current) use of insulin: Secondary | ICD-10-CM | POA: Diagnosis not present

## 2023-05-08 DIAGNOSIS — J329 Chronic sinusitis, unspecified: Secondary | ICD-10-CM | POA: Diagnosis not present

## 2023-05-08 DIAGNOSIS — G63 Polyneuropathy in diseases classified elsewhere: Secondary | ICD-10-CM

## 2023-05-08 MED ORDER — ROSUVASTATIN CALCIUM 20 MG PO TABS
20.0000 mg | ORAL_TABLET | Freq: Every day | ORAL | 3 refills | Status: DC
Start: 2023-05-08 — End: 2023-12-09

## 2023-05-08 NOTE — Progress Notes (Signed)
 Established Patient Office Visit  Subjective   Patient ID: Louis Carney, male    DOB: 1980-11-17  Age: 43 y.o. MRN: [id number]  Chief Complaint  Patient presents with   Medical Management of Chronic Issues    HPI  Hypertension-patient has been taking the olmesartan  and amlodipine .  Does not need refill at this time.  Diabetes-patient recently saw his endocrinologist and is still currently on Ozempic  only.  Has been taken off all other medications for diabetes.  Patient states he needs a refill of his rosuvastatin .  Called 1 into his pharmacy 3 days ago and has not heard back from anybody yet.  Patient has sleep study scheduled for tonight that was ordered by his eye doctor, Dr. Eyvonne.  He initially saw him for diabetic retinopathy.  Also discussed his left eye drainage issues and for this he states the doctor ordered the sleep study.  Patient does not tolerate over-the-counter second-generation antihistamines due to drowsiness.  Has tried all of them.  Has never seen a allergist.  He is open to allergy  referral.  Back pain and neuropathy-we discussed  the notes I have received from his previous neurosurgeon.  He would still like referral to a new neurosurgery practice for a second opinion.  Still has issues with chronic back pain and neuropathy.  Patient is no longer taking the Celebrex .  States it made him feel unmotivated same way gabapentin  did.  He is back to taking ibuprofen.  We discussed trying naproxen as it has a better side effect profile than the ibuprofen.    The 10-year ASCVD risk score (Arnett DK, et al., 2019) is: 21.5%  Health Maintenance Due  Topic Date Due   COVID-19 Vaccine (1) Never done   OPHTHALMOLOGY EXAM  01/29/2020      Objective:     BP (!) 147/87   Pulse 75   Ht 5' 8 (1.727 m)   Wt 207 lb 12.8 oz (94.3 kg)   SpO2 97%   BMI 31.60 kg/m    Physical Exam General: Alert, oriented HEENT: Mild ptosis of the left eye. Pulmonary: No respiratory  distress Psych: Pleasant affect   No results found for any visits on 05/08/23.      Assessment & Plan:   Environmental allergies Assessment & Plan: Patient has chronic sinusitis related to allergies.  Does not tolerate antihistamines, even second-generation histamines due to drowsiness.  Has never seen an allergy  specialist.  Will refer to specialist for identification of specific allergen triggers and discussion of treatment with immunotherapy given his inability to tolerate antihistamines.  Orders: -     Ambulatory referral to Allergy   Hyperlipidemia, mixed -     Rosuvastatin  Calcium ; Take 1 tablet (20 mg total) by mouth daily.  Dispense: 90 tablet; Refill: 3  Hypertriglyceridemia -     Rosuvastatin  Calcium ; Take 1 tablet (20 mg total) by mouth daily.  Dispense: 90 tablet; Refill: 3  Chronic radicular low back pain- R sided Assessment & Plan: Received 2 notes in response to records request with neurosurgery, 1 discussing MRI lumbar spine results and other discussed a right upper extremity EMG results.  Will refer patient to new neurosurgery office for second opinion as he requests for the evaluation of his lower extremity neuropathy and right upper extremity neuropathy/weakness.  Orders: -     Ambulatory referral to Neurosurgery  Neuropathy due to medical condition (HCC)-C8-T1 nerve distribution bilateral upper extremity, -     Ambulatory referral to Neurosurgery  Hyperlipidemia associated with  type 2 diabetes mellitus (HCC) Assessment & Plan: Patient compliant with rosuvastatin .  Will send in refill.  At goal.   Type 2 diabetes mellitus with hyperglycemia, with long-term current use of insulin  [facility]) Assessment & Plan: Continues to follow with endocrinology.  Currently only taking Ozempic  has been taken off of other medications for diabetes.   Chronic sinusitis, unspecified location Assessment & Plan: Has seen ENT.  Particularly his left side is chronically congested  to the point where he feels like drainage is coming out of his left eye socket.  Continue evaluation with ENT.  Referring to allergy  specialist      Return in about 3 months (around 08/06/2023) for back pain, htn, .    Toribio MARLA Slain, MD

## 2023-05-08 NOTE — Patient Instructions (Addendum)
 It was nice to see you today,  We addressed the following topics today: -I have sent in a prescription for your Crestor . - I am sending in referrals to both the allergy  and neurosurgeon.  Give us  about a month and if you have not heard from anybody let us  know and we will give you the information of release of the referral to - For your chronic pain instead of ibuprofen you can try Aleve.  Take it every 12 hours as needed  Have a great day,  Rolan Slain, MD

## 2023-05-09 NOTE — Assessment & Plan Note (Addendum)
 Has seen ENT.  Particularly his left side is chronically congested to the point where he feels like drainage is coming out of his left eye socket.  Continue evaluation with ENT.  Referring to allergy specialist

## 2023-05-09 NOTE — Assessment & Plan Note (Signed)
 Continues to follow with endocrinology.  Currently only taking Ozempic has been taken off of other medications for diabetes.

## 2023-05-09 NOTE — Assessment & Plan Note (Signed)
 Received 2 notes in response to records request with neurosurgery, 1 discussing MRI lumbar spine results and other discussed a right upper extremity EMG results.  Will refer patient to new neurosurgery office for second opinion as he requests for the evaluation of his lower extremity neuropathy and right upper extremity neuropathy/weakness.

## 2023-05-09 NOTE — Assessment & Plan Note (Signed)
 Patient compliant with rosuvastatin.  Will send in refill.  At goal.

## 2023-05-09 NOTE — Assessment & Plan Note (Signed)
 Patient has chronic sinusitis related to allergies.  Does not tolerate antihistamines, even second-generation histamines due to drowsiness.  Has never seen an allergy  specialist.  Will refer to specialist for identification of specific allergen triggers and discussion of treatment with immunotherapy given his inability to tolerate antihistamines.

## 2023-05-22 NOTE — Progress Notes (Unsigned)
Referring Physician:  Sandre Kitty, MD [address] [place],  [place] [address]  Primary Physician:  Louis Kitty, MD  History of Present Illness: 05/24/23 [name] is here today with a chief complaint of back pain.  This has been ongoing for at least a decade.  He previously underwent a lumbar discectomy and 6 years ago without relief.  He states that his back pain is always present and he always finds himself having to lean forward to help get relief.  At times he adds that it feels as though his right leg drags or gives out on him and he feels as though he is constantly tripping over his right foot.  He feels as though the weakness has been present for the past 2 years he feels as though he has decreased sensation on the side as well.  He adds that both feet are numb constantly.  He is currently using IcyHot and ibuprofen.  He states that he often will drop things with his hands and is unable to walk straight and often has to use the wall or a railing to keep from falling down.  He is unable to take neuropathic pain medication secondary to adverse reaction.  Denies any saddle anesthesia.  Denies incontinence of bowel or bladder.  Duration: 5+ years  Severity: 8/10  Precipitating: aggravated by standing, walking, bending, stretching Modifying factors: made better by  Weakness: none Timing: constant Bowel/Bladder Dysfunction: none  Conservative measures:  Physical therapy: Has not participated in Multimodal medical therapy including regular antiinflammatories:  Celebrex, ibuprofen, gabapentin Injections:  epidural steroid injections, 2 injections with relief  Past Surgery:  01/15/2018 Right Carpal Tunnel Release   03/07/2018:Right Lumbar Microdiscectomy   Cervical surgery, 20+ years ago   The symptoms are causing a significant impact on the patient's life.   Review of Systems:  A 10 point review of systems is negative, except for the pertinent positives and  negatives detailed in the HPI.  Past Medical History: Past Medical History:  Diagnosis Date   Arthritis    Diabetes (HCC)    type 1   GERD (gastroesophageal reflux disease)    Headache    migraines   Hypertension    Psoriasis    since age 24 years   TB lung, latent 11/06/2017   Transaminitis 01/29/2018   Tuberculosis    latent TB infection on treatment    Past Surgical History: Past Surgical History:  Procedure Laterality Date   CARPAL TUNNEL RELEASE Right 01/15/2018   Procedure: Right Carpal tunnel release;  Surgeon: Coletta Memos, MD;  Location: [facility] OR;  Service: Neurosurgery;  Laterality: Right;  Right Carpal tunnel release   CERVICAL SPINE SURGERY     LUMBAR LAMINECTOMY/DECOMPRESSION MICRODISCECTOMY Right 03/07/2018   Procedure: Right Lumbar 5 Sacral 1 Microdiscectomy;  Surgeon: Coletta Memos, MD;  Location: [facility] OR;  Service: Neurosurgery;  Laterality: Right;  Right Lumbar 5 Sacral 1 Microdiscectomy   NECK SURGERY      Allergies: Allergies as of 05/24/2023   (No Known Allergies)    Medications: Outpatient Encounter Medications as of 05/24/2023  Medication Sig   amLODipine (NORVASC) 10 MG tablet Take 10 mg by mouth daily.   Blood Glucose Monitoring Suppl (ONETOUCH VERIO) w/Device KIT Inject 1 Lancet into the skin as directed.   clindamycin (CLEOCIN T) 1 % external solution Apply to aa QD.   clobetasol cream (TEMOVATE) 0.05 % Apply to aa psoriasis QD 3d/wk. Avoid applying to face, groin, and  axilla. Use as directed. Long-term use can cause thinning of the skin.   esomeprazole (NEXIUM) 20 MG capsule Take 20 mg by mouth daily at 12 noon.   fluticasone (FLONASE) 50 MCG/ACT nasal spray Place 2 sprays into both nostrils daily.   levocetirizine (XYZAL) 5 MG tablet Take 1 tablet (5 mg total) by mouth every evening.   metoprolol succinate (TOPROL-XL) 100 MG 24 hr tablet Take 1 tablet (100 mg total) by mouth daily. Take with or immediately following a meal.   montelukast  (SINGULAIR) 10 MG tablet Take 1 tablet (10 mg total) by mouth at bedtime.   Olmesartan-amLODIPine-HCTZ 40-10-12.5 MG TABS Take 1 tablet by mouth daily.   OneTouch Delica Lancets 33G MISC USE TO TEST BLOOD SUGARS 6 TIMES DAILY (BEFORE MEALS AND 2 HOURS AFTER MEALS)   ONETOUCH VERIO test strip USE TO TEST 6 TIMES DAILY (BEFORE MEALS AND 2 HOURS AFTER MEALS)   rosuvastatin (CRESTOR) 20 MG tablet Take 1 tablet (20 mg total) by mouth daily.   Semaglutide, 1 MG/DOSE, (OZEMPIC, 1 MG/DOSE,) 4 MG/3ML SOPN Inject 1 mg into the skin once a week.   sildenafil (VIAGRA) 100 MG tablet TAKE 1/2 (ONE-HALF) TABLET BY MOUTH 30 MINUTES PRIOR TO INTERCOURSE AS NEEDED   [DISCONTINUED] risankizumab-rzaa (SKYRIZI PEN) 150 MG/ML pen Inject 1 mL (150 mg total) into the skin as directed. Every 12 weeks for maintenance.   [DISCONTINUED] albuterol (VENTOLIN HFA) 108 (90 Base) MCG/ACT inhaler Inhale 2 puffs into the lungs every 6 (six) hours as needed for wheezing or shortness of breath. (Patient not taking: Reported on 05/24/2023)   [DISCONTINUED] azelastine (ASTELIN) 0.1 % nasal spray Place 2 sprays into both nostrils 2 (two) times daily. Use in each nostril as directed (Patient not taking: Reported on 05/23/2023)   [DISCONTINUED] blood glucose meter kit and supplies Dispense based on patient and insurance preference. Use to check glucose level three times daily before meals and 3 times daily 2 hours after meals (6 times daily total). (FOR ICD-10 E10.9, E11.9).   [DISCONTINUED] celecoxib (CELEBREX) 200 MG capsule Take 1 capsule (200 mg total) by mouth 2 (two) times daily. (Patient not taking: Reported on 05/24/2023)   [DISCONTINUED] Clobetasol Prop Emollient Base (CLOBETASOL PROPIONATE E) 0.05 % emollient cream Apply 1 application topically daily.   [DISCONTINUED] glucose blood test strip 1 each by Other route as needed. (Patient not taking: Reported on 05/24/2023)   [DISCONTINUED] Lancets (ONETOUCH DELICA PLUS LANCET30G) MISC Inject  1 Lancet as directed 6 (six) times daily. Before meals and 2 hours after meals   [DISCONTINUED] ondansetron (ZOFRAN) 4 MG tablet Take 2 tablets (8 mg total) by mouth every 8 (eight) hours as needed for nausea or vomiting. (Patient not taking: Reported on 05/24/2023)   [DISCONTINUED] risankizumab-rzaa (SKYRIZI) 150 MG/ML SOSY prefilled syringe Inject 150 mg into the skin as directed.   No facility-administered encounter medications on file as of 05/24/2023.    Social History: Social History   Tobacco Use   Smoking status: Every Day    Current packs/day: 1.00    Average packs/day: 1 pack/day for 15.0 years (15.0 ttl pk-yrs)    Types: Cigarettes   Smokeless tobacco: Never  Vaping Use   Vaping status: Never Used  Substance Use Topics   Alcohol use: Yes    Alcohol/week: 24.0 standard drinks of alcohol    Types: 24 Standard drinks or equivalent per week   Drug use: Never    Family Medical History: Family History  Problem Relation Age of  Onset   Alcohol abuse Mother    Hypertension Mother    Lung cancer Mother    Alcohol abuse Father    Hypertension Father    Colon cancer Father        dx in his late 69's   Esophageal cancer Neg Hx    Rectal cancer Neg Hx     Physical Examination:   General: Patient is well developed, well nourished, calm, collected, and in no apparent distress. Attention to examination is appropriate.  Psychiatric: Patient is non-anxious.  Head:  Pupils equal, round, and reactive to light.  ENT:  Oral mucosa appears well hydrated.  Neck:   Supple.  Decreased  range of motion.  Respiratory: Patient is breathing without any difficulty.  Extremities: No edema.  Vascular: Palpable dorsal pedal pulses.  Skin:   On exposed skin, there are no abnormal skin lesions.  NEUROLOGICAL:    Hyperreflexia     Awake, alert, oriented to person, place, and time.  Speech is clear and fluent. Fund of knowledge is appropriate.   Cranial Nerves: Pupils equal  round and reactive to light.  Facial tone is symmetric.  ROM of spine: Decreased range of motion secondary to pain of neck and back.  Mild tenderness palpation of the paraspinals.  Strength:  Pinky finger baseline weakness due to tendon injury.  Side Biceps Triceps Deltoid Interossei Grip Wrist Ext. Wrist Flex.  R 4 4+ 4 4 4+ 3 5  L 5 5 5 5 5 5 5    Side Iliopsoas Quads Hamstring PF DF EHL  R 5 5 5 5 5 5   L 5 5 5 5 5 5    Reflexes are 3+ throughout bilateral upper extremities.  Hoffman's is present.  3+ patellar, 2+ Achilles.  Positive cross adductor sign Clonus is not present.  Toes are down-going.  Patient has decreased sensation predominantly in right lower extremity and bilateral hands. Patient is unable to walk in a straight line.  Medical Decision Making  Imaging:   I have personally reviewed the images and agree with the above interpretation.  Assessment and Plan: Mr. Kimball is a pleasant 43 y.o. male is here today with a chief complaint of back pain.  This has been ongoing for at least a decade. Previous history of both cervical and lumbar spine surgery He previously underwent a lumbar discectomy and 6 years ago without relief.  He states that his back pain is always present and he always finds himself having to lean forward to help get relief.  At times he adds that it feels as though his right leg drags or gives out on him and he feels as though he is constantly tripping over his right foot.  He feels as though the weakness has been present for the past 2 years he feels as though he has decreased sensation on the side as well.  He adds that both feet are numb constantly.  He is currently using IcyHot and ibuprofen.  He states that he often will drop things with his hands and is unable to walk straight and often has to use the wall or a railing to keep from falling down.  He is unable to take neuropathic pain medication secondary to adverse reaction.  Denies any saddle anesthesia.   Denies incontinence of bowel or bladder.  On examination, patient has significant weakness in bilateral upper extremities, right worse than left as detailed above.  Due to significant weakness, progressive issues with his gait, hyperreflexia-I am concerned  for cervical myelopathy would like patient to undergo MRI of the cervical spine.  Of note, patient does have significant alcohol intake which could be contributing to his reflexes as well.  I would also like him to undergo an MRI of his lumbar spine that is more bothersome to him, but would like to rule out cervical myelopathy before we continue.  I will review results once complete.  Patient to undergo cervical and lumbar x-rays today after clinic.  Thank you for involving me in the care of this patient.   Joan Flores, PA-C Dept. of Neurosurgery

## 2023-05-23 ENCOUNTER — Encounter: Payer: Self-pay | Admitting: Internal Medicine

## 2023-05-23 ENCOUNTER — Other Ambulatory Visit: Payer: Self-pay

## 2023-05-23 ENCOUNTER — Ambulatory Visit: Payer: Medicare Other | Admitting: Internal Medicine

## 2023-05-23 VITALS — BP 138/84 | HR 88 | Temp 98.5°F | Ht 67.0 in | Wt 213.6 lb

## 2023-05-23 DIAGNOSIS — H02402 Unspecified ptosis of left eyelid: Secondary | ICD-10-CM

## 2023-05-23 DIAGNOSIS — J3089 Other allergic rhinitis: Secondary | ICD-10-CM | POA: Diagnosis not present

## 2023-05-23 DIAGNOSIS — H5789 Other specified disorders of eye and adnexa: Secondary | ICD-10-CM | POA: Diagnosis not present

## 2023-05-23 DIAGNOSIS — H5713 Ocular pain, bilateral: Secondary | ICD-10-CM

## 2023-05-23 MED ORDER — LEVOCETIRIZINE DIHYDROCHLORIDE 5 MG PO TABS: 5.0000 mg | ORAL_TABLET | Freq: Every evening | ORAL | 5 refills | Status: DC

## 2023-05-23 MED ORDER — FLUTICASONE PROPIONATE 50 MCG/ACT NA SUSP: 2.0000 | Freq: Every day | NASAL | 5 refills | Status: DC

## 2023-05-23 NOTE — Progress Notes (Signed)
NEW PATIENT  Date of Service/Encounter:  05/23/23  Consult requested by: Sandre Kitty, MD   Subjective:   Louis Carney (DOB: March 06, 1981) is a 43 y.o. male who presents to the clinic on 05/23/2023 with a chief complaint of Allergies, Sinus Problem, and Establish Care .    History obtained from: chart review and patient.   Rhinitis Eye problems  Started a few years ago.    Symptoms include:  Left eye discharge and pain and sometimes also on R eye.  Notes stringy mucous drainage.  Also has noted droopy eyelids.  No blurry vision or vision changes. Seen by Dr Evelena Leyden and told to get sleepy study but reports no other recommendations or evaluation performed.   He has some other sinus symptoms but his biggest problem is the eyes.   nasal congestion, rhinorrhea, and post nasal drainage Seen ENT for possibly sinus infection and treated with antibiotics without relief.   Occurs year-round Potential triggers: not sure, possibly dogs  Treatments tried: Benadryl/Zyrtec/Allegra PRN; causes sleepiness Flonase in the past Pataday PRN  Previous allergy testing: no History of sinus surgery: no Nonallergic triggers: none   Denies any hx of asthma, previously prescribed Albuterol when he was sick.  Chart review shows he saw Abonza PA for acute sinusitis and had adventitious sounds so discussed use of PRN albuterol.  He is a current smoker.   Reviewed:  05/08/2023: seen by Constance Goltz MD for DM, HTN, HLD, allergic rhinitis. Has drowsiness with second generation anti histamines.  Discussed referral to Allergy.   12/26/2022: followed by Dermatology for psoriasis on Clobetasol and Skyrizi.  02/17/2020: seen by PCP for HTN; added Metoprolol for better control.   Past Medical History: Past Medical History:  Diagnosis Date   Arthritis    Diabetes (HCC)    type 1   GERD (gastroesophageal reflux disease)    Headache    migraines   Hypertension    Psoriasis    since age 62 years   TB lung,  latent 11/06/2017   Transaminitis 01/29/2018   Tuberculosis    latent TB infection on treatment    Past Surgical History: Past Surgical History:  Procedure Laterality Date   CARPAL TUNNEL RELEASE Right 01/15/2018   Procedure: Right Carpal tunnel release;  Surgeon: Coletta Memos, MD;  Location[facility] OR;  Service: Neurosurgery;  Laterality: Right;  Right Carpal tunnel release   CERVICAL SPINE SURGERY     LUMBAR LAMINECTOMY/DECOMPRESSION MICRODISCECTOMY Right 03/07/2018   Procedure: Right Lumbar 5 Sacral 1 Microdiscectomy;  Surgeon: Coletta Memos, MD;  Location: [facility] OR;  Service: Neurosurgery;  Laterality: Right;  Right Lumbar 5 Sacral 1 Microdiscectomy   NECK SURGERY      Family History: Family History  Problem Relation Age of Onset   Alcohol abuse Mother    Hypertension Mother    Lung cancer Mother    Alcohol abuse Father    Hypertension Father    Colon cancer Father        dx in his late 48's   Esophageal cancer Neg Hx    Rectal cancer Neg Hx     Social History:  Flooring in bedroom: carpet Pets: dog Tobacco use/exposure: current smoker, started 2002, 1.5ppd  Job: none  Medication List:  Allergies as of 05/23/2023   No Known Allergies      Medication List        Accurate as of May 23, 2023  2:05 PM. If you have any questions, ask your nurse or  doctor.          albuterol 108 (90 Base) MCG/ACT inhaler Commonly known as: VENTOLIN HFA Inhale 2 puffs into the lungs every 6 (six) hours as needed for wheezing or shortness of breath.   amLODipine 10 MG tablet Commonly known as: NORVASC Take 10 mg by mouth daily.   azelastine 0.1 % nasal spray Commonly known as: ASTELIN Place 2 sprays into both nostrils 2 (two) times daily. Use in each nostril as directed   blood glucose meter kit and supplies Dispense based on patient and insurance preference. Use to check glucose level three times daily before meals and 3 times daily 2 hours after meals (6 times daily total).  (FOR ICD-10 E10.9, E11.9).   celecoxib 200 MG capsule Commonly known as: CeleBREX Take 1 capsule (200 mg total) by mouth 2 (two) times daily.   clindamycin 1 % external solution Commonly known as: CLEOCIN T Apply to aa QD.   clobetasol cream 0.05 % Commonly known as: TEMOVATE Apply to aa psoriasis QD 3d/wk. Avoid applying to face, groin, and axilla. Use as directed. Long-term use can cause thinning of the skin.   Clobetasol Prop Emollient Base 0.05 % emollient cream Commonly known as: Clobetasol Propionate E Apply 1 application topically daily.   esomeprazole 20 MG capsule Commonly known as: NEXIUM Take 20 mg by mouth daily at 12 noon.   metoprolol succinate 100 MG 24 hr tablet Commonly known as: TOPROL-XL Take 1 tablet (100 mg total) by mouth daily. Take with or immediately following a meal.   montelukast 10 MG tablet Commonly known as: SINGULAIR Take 1 tablet (10 mg total) by mouth at bedtime.   Olmesartan-amLODIPine-HCTZ 40-10-12.5 MG Tabs Take 1 tablet by mouth daily.   ondansetron 4 MG tablet Commonly known as: Zofran Take 2 tablets (8 mg total) by mouth every 8 (eight) hours as needed for nausea or vomiting.   OneTouch Delica Plus Lancet30G Misc Inject 1 Lancet as directed 6 (six) times daily. Before meals and 2 hours after meals   OneTouch Delica Lancets 33G Misc USE TO TEST BLOOD SUGARS 6 TIMES DAILY (BEFORE MEALS AND 2 HOURS AFTER MEALS)   glucose blood test strip 1 each by Other route as needed.   OneTouch Verio test strip Generic drug: glucose blood USE TO TEST 6 TIMES DAILY (BEFORE MEALS AND 2 HOURS AFTER MEALS)   OneTouch Verio w/Device Kit Inject 1 Lancet into the skin as directed.   Ozempic (1 MG/DOSE) 4 MG/3ML Sopn Generic drug: Semaglutide (1 MG/DOSE) Inject 1 mg into the skin once a week.   rosuvastatin 20 MG tablet Commonly known as: CRESTOR Take 1 tablet (20 mg total) by mouth daily.   sildenafil 100 MG tablet Commonly known as:  VIAGRA TAKE 1/2 (ONE-HALF) TABLET BY MOUTH 30 MINUTES PRIOR TO INTERCOURSE AS NEEDED   Skyrizi 150 MG/ML Sosy prefilled syringe Generic drug: risankizumab-rzaa Inject 150 mg into the skin as directed.   Skyrizi Pen 150 MG/ML pen Generic drug: risankizumab-rzaa Inject 1 mL (150 mg total) into the skin as directed. Every 12 weeks for maintenance.         REVIEW OF SYSTEMS: Pertinent positives and negatives discussed in HPI.   Objective:   Physical Exam: BP 138/84 (BP Location: Left Arm, Patient Position: Sitting, Cuff Size: Normal)   Pulse 88   Temp 98.5 F (36.9 C)   Ht 5\' 7"  (1.702 m)   Wt 213 lb 9.6 oz (96.9 kg)   SpO2 95%  BMI 33.45 kg/m  Body mass index is 33.45 kg/m. GEN: alert, well developed HEENT: clear conjunctiva, + L eye ptosis, nose with + mild inferior turbinate hypertrophy, pink nasal mucosa, slight clear rhinorrhea, no cobblestoning HEART: regular rate and rhythm, no murmur LUNGS: clear to auscultation bilaterally, no coughing, unlabored respiration ABDOMEN: soft, non distended  SKIN: no rashes or lesions   Assessment:   1. Other allergic rhinitis   2. Eye discharge   3. Eye pain, bilateral   4. Ptosis of left eyelid     Plan/Recommendations:  Other Allergic Rhinitis: - Due to turbinate hypertrophy, seasonal symptoms and unresponsive to over the counter meds, will perform skin testing to identify aeroallergen triggers.   - Use nasal saline rinses before nose sprays such as with Neilmed Sinus Rinse.  Use distilled water.   - Use Flonase 2 sprays each nostril daily. Aim upward and outward. - Use Xyzal 5mg  daily.  - Hold all anti-histamines (Xyzal, Allegra, Zyrtec, Claritin, Benadryl, Pepcid) 3 days prior to next visit.  - If symptoms persist, will send to another ENT.   Eye Discharge and Pain - Recommend seeing another physician at practice for further evaluation as I am not convinced this is allergic conjunctivitis.   Discussed ptosis, eye pain  and discharge are not allergic conjunctivitis.   Follow up: 130 PM on 1/30 for skin testing 1-55, IDs okay   Alesia Morin, MD [facility]

## 2023-05-23 NOTE — Patient Instructions (Addendum)
Other Allergic Rhinitis: - Use nasal saline rinses before nose sprays such as with Neilmed Sinus Rinse.  Use distilled water.   - Use Flonase 2 sprays each nostril daily. Aim upward and outward. - Use Xyzal 5mg  daily.  - Hold all allergy medications (nose sprays, Xyzal, Benadryl, Zyrtec, eye drops) 3 days prior to next visit.   Eye Discharge and Pain, Ptosis  - Recommend seeing another physician in Ophthalmology office for further evaluation and second opinion.   Follow up: 130 PM on 1/30 for skin testing 1-55

## 2023-05-24 ENCOUNTER — Other Ambulatory Visit: Payer: Self-pay | Admitting: Dermatology

## 2023-05-24 ENCOUNTER — Ambulatory Visit (INDEPENDENT_AMBULATORY_CARE_PROVIDER_SITE_OTHER): Payer: Medicare Other | Admitting: Physician Assistant

## 2023-05-24 ENCOUNTER — Ambulatory Visit
Admission: RE | Admit: 2023-05-24 | Discharge: 2023-05-24 | Disposition: A | Discharge status: Home / Self Care | Payer: Medicare Other | Attending: Physician Assistant | Admitting: Physician Assistant

## 2023-05-24 ENCOUNTER — Ambulatory Visit
Admission: RE | Admit: 2023-05-24 | Discharge: 2023-05-24 | Disposition: A | Discharge status: Home / Self Care | Payer: Medicare Other | Source: Ambulatory Visit | Attending: Physician Assistant | Admitting: Physician Assistant

## 2023-05-24 VITALS — BP 134/84 | Ht 67.0 in | Wt 206.0 lb

## 2023-05-24 DIAGNOSIS — R292 Abnormal reflex: Secondary | ICD-10-CM | POA: Diagnosis not present

## 2023-05-24 DIAGNOSIS — R2681 Unsteadiness on feet: Secondary | ICD-10-CM

## 2023-05-24 DIAGNOSIS — M5441 Lumbago with sciatica, right side: Secondary | ICD-10-CM | POA: Insufficient documentation

## 2023-05-24 DIAGNOSIS — M2578 Osteophyte, vertebrae: Secondary | ICD-10-CM | POA: Diagnosis not present

## 2023-05-24 DIAGNOSIS — G8929 Other chronic pain: Secondary | ICD-10-CM

## 2023-05-24 DIAGNOSIS — M47816 Spondylosis without myelopathy or radiculopathy, lumbar region: Secondary | ICD-10-CM | POA: Diagnosis not present

## 2023-05-24 DIAGNOSIS — R202 Paresthesia of skin: Secondary | ICD-10-CM

## 2023-05-24 DIAGNOSIS — L400 Psoriasis vulgaris: Secondary | ICD-10-CM

## 2023-05-24 DIAGNOSIS — Z789 Other specified health status: Secondary | ICD-10-CM | POA: Diagnosis not present

## 2023-05-24 DIAGNOSIS — R200 Anesthesia of skin: Secondary | ICD-10-CM | POA: Diagnosis not present

## 2023-05-24 DIAGNOSIS — Z981 Arthrodesis status: Secondary | ICD-10-CM | POA: Diagnosis not present

## 2023-05-24 DIAGNOSIS — M545 Low back pain, unspecified: Secondary | ICD-10-CM | POA: Diagnosis not present

## 2023-05-27 ENCOUNTER — Ambulatory Visit
Admission: RE | Admit: 2023-05-27 | Discharge: 2023-05-27 | Disposition: A | Discharge status: Home / Self Care | Payer: Medicare Other | Source: Ambulatory Visit | Attending: Physician Assistant | Admitting: Physician Assistant

## 2023-05-27 DIAGNOSIS — M5441 Lumbago with sciatica, right side: Secondary | ICD-10-CM | POA: Insufficient documentation

## 2023-05-27 DIAGNOSIS — M542 Cervicalgia: Secondary | ICD-10-CM | POA: Diagnosis not present

## 2023-05-27 DIAGNOSIS — R2681 Unsteadiness on feet: Secondary | ICD-10-CM | POA: Insufficient documentation

## 2023-05-27 DIAGNOSIS — M79601 Pain in right arm: Secondary | ICD-10-CM | POA: Diagnosis not present

## 2023-05-27 DIAGNOSIS — R202 Paresthesia of skin: Secondary | ICD-10-CM | POA: Insufficient documentation

## 2023-05-27 DIAGNOSIS — M50223 Other cervical disc displacement at C6-C7 level: Secondary | ICD-10-CM | POA: Diagnosis not present

## 2023-05-27 DIAGNOSIS — R292 Abnormal reflex: Secondary | ICD-10-CM | POA: Diagnosis not present

## 2023-05-27 DIAGNOSIS — M50221 Other cervical disc displacement at C4-C5 level: Secondary | ICD-10-CM | POA: Diagnosis not present

## 2023-05-27 DIAGNOSIS — R200 Anesthesia of skin: Secondary | ICD-10-CM | POA: Insufficient documentation

## 2023-05-27 DIAGNOSIS — M5030 Other cervical disc degeneration, unspecified cervical region: Secondary | ICD-10-CM | POA: Diagnosis not present

## 2023-05-27 DIAGNOSIS — M79602 Pain in left arm: Secondary | ICD-10-CM | POA: Diagnosis not present

## 2023-05-27 DIAGNOSIS — S3992XA Unspecified injury of lower back, initial encounter: Secondary | ICD-10-CM | POA: Diagnosis not present

## 2023-05-27 DIAGNOSIS — G8929 Other chronic pain: Secondary | ICD-10-CM | POA: Diagnosis not present

## 2023-05-27 DIAGNOSIS — M4802 Spinal stenosis, cervical region: Secondary | ICD-10-CM | POA: Diagnosis not present

## 2023-05-30 ENCOUNTER — Ambulatory Visit: Payer: Medicare Other | Admitting: Internal Medicine

## 2023-05-30 DIAGNOSIS — J3089 Other allergic rhinitis: Secondary | ICD-10-CM

## 2023-05-30 NOTE — Progress Notes (Signed)
FOLLOW UP Date of Service/Encounter:  05/30/23   Subjective:  Louis Carney (DOB: 1981/04/06) is a 43 y.o. male who [facility] for follow up for skin testing.   History obtained from: chart review and patient.  Anti histamines held.   Past Medical History: Past Medical History:  Diagnosis Date   Arthritis    Diabetes (HCC)    type 1   GERD (gastroesophageal reflux disease)    Headache    migraines   Hypertension    Psoriasis    since age 29 years   TB lung, latent 11/06/2017   Transaminitis 01/29/2018   Tuberculosis    latent TB infection on treatment    Objective:  There were no vitals taken for this visit. There is no height or weight on file to calculate BMI. Physical Exam: GEN: alert, well developed HEENT: clear conjunctiva, MMM LUNGS: unlabored respiration   Skin Testing:  Skin prick testing was placed, which includes aeroallergens/foods, histamine control, and saline control.  Verbal consent was obtained prior to placing test.  Patient tolerated procedure well.  Allergy testing results were read and interpreted by myself, documented by clinical staff. Adequate positive and negative control.  Positive results to:  Results discussed with patient/family.  Airborne Adult Perc - 05/30/23 1323     Time Antigen Placed 1323    Allergen Manufacturer Waynette Buttery    Location Back    Number of Test 55    2. Control-Histamine 3+    3. Bahia Negative    4. [place] Negative    5. Johnson Negative    6. Kentucky Blue Negative    7. Meadow Fescue Negative    8. Perennial Rye Negative    9. Timothy Negative    10. Ragweed Mix Negative    11. Cocklebur Negative    12. Plantain,  English Negative    13. Baccharis Negative    14. Dog Fennel Negative    15. Russian Thistle Negative    16. Lamb's Quarters Negative    17. Sheep Sorrell Negative    18. Rough Pigweed Negative    19. Marsh Elder, Rough Negative    20. Mugwort,  Common Negative    21. Box, Elder Negative    22. Cedar, red Negative    23. Sweet Gum Negative    24. Pecan Pollen Negative    25. Pine Mix Negative    26. Walnut, Black Pollen Negative    27. Red Mulberry Negative    28. Ash Mix Negative    29. Birch Mix Negative    30. Beech American Negative    31. Cottonwood, [place] Negative    32. Hickory, White Negative    33. Maple Mix Negative    34. Oak, [place] Mix Negative    35. Sycamore Eastern Negative    36. Alternaria Alternata Negative    37. Cladosporium Herbarum Negative    38. Aspergillus Mix Negative    39. Penicillium Mix Negative    40. Bipolaris Sorokiniana (Helminthosporium) Negative    41. Drechslera Spicifera (Curvularia) Negative    42. Mucor Plumbeus Negative    43. Fusarium Moniliforme Negative    44. Aureobasidium Pullulans (pullulara) Negative    45. Rhizopus Oryzae Negative    46. Botrytis Cinera Negative    47. Epicoccum Nigrum Negative    48. Phoma Betae Negative    49. Dust Mite Mix Negative    50. Cat Hair 10,000 BAU/ml Negative  51.  Dog Epithelia Negative    52. Mixed Feathers Negative    53. Horse Epithelia Negative    54. Cockroach, German Negative    55. Tobacco Leaf Negative              Assessment:   1. Other allergic rhinitis     Plan/Recommendations:  Chronic Rhinitis: - Due to turbinate hypertrophy, seasonal symptoms and unresponsive to over the counter meds, will perform skin testing to identify aeroallergen triggers.   - Positive skin test 05/2023: none  - Avoidance measures discussed. - Use nasal saline rinses before nose sprays such as with Neilmed Sinus Rinse.  Use distilled water.   - Use Flonase 2 sprays each nostril daily. Aim upward and outward. - Use Azelastine/Astepro 1-2 sprays each nostril twice daily as needed for runny nose, drainage, sneezing, congestion. Aim upward and outward. - Unable to tolerate oral anti histamines. Xyzal caused extreme lethargy   Eye  Discharge and Pain, Ptosis  - Recommend seeing another physician in Ophthalmology office for further evaluation and second opinion. This would not be explained by allergic conjunctivitis and your skin testing today was negative for aeroallergens.      Return if symptoms worsen or fail to improve.  Alesia Morin, MD [facility]

## 2023-05-30 NOTE — Patient Instructions (Addendum)
Chronic Rhinitis:  - Positive skin test 05/2023: none  - Use nasal saline rinses before nose sprays such as with Neilmed Sinus Rinse.  Use distilled water.   - Use Flonase 2 sprays each nostril daily. Aim upward and outward. - Use Azelastine 1-2 sprays each nostril twice daily as needed for runny nose, drainage, sneezing, congestion. Aim upward and outward. - Stop Xyzal.  Eye Discharge and Pain, Ptosis  - Recommend seeing another physician in Ophthalmology office for further evaluation and second opinion. You do not have allergies.

## 2023-06-05 ENCOUNTER — Other Ambulatory Visit: Payer: Self-pay | Admitting: Physician Assistant

## 2023-06-05 DIAGNOSIS — M4712 Other spondylosis with myelopathy, cervical region: Secondary | ICD-10-CM

## 2023-06-06 NOTE — Addendum Note (Signed)
 Addended by: Dwanna Goshert on: 06/06/2023 11:22 AM   Modules accepted: Orders

## 2023-06-10 ENCOUNTER — Ambulatory Visit: Admission: RE | Admit: 2023-06-10 | Payer: Medicare Other | Source: Ambulatory Visit

## 2023-06-11 ENCOUNTER — Other Ambulatory Visit: Payer: Self-pay | Admitting: Radiology

## 2023-06-13 NOTE — Progress Notes (Signed)
Patient for DG Cervical Myelogram Inj/CT Cervical Myelogram on Friday 06/14/23, I called and spoke with the patient on the phone and gave pre-procedure instructions. Pt was made aware to be here at 9:30a and check in at the new entrance. Pt stated understanding.  Called 06/11/23

## 2023-06-14 ENCOUNTER — Ambulatory Visit
Admission: RE | Admit: 2023-06-14 | Discharge: 2023-06-14 | Disposition: A | Discharge status: Home / Self Care | Payer: Medicare Other | Source: Ambulatory Visit | Attending: Physician Assistant | Admitting: Physician Assistant

## 2023-06-14 ENCOUNTER — Other Ambulatory Visit: Payer: Self-pay

## 2023-06-14 DIAGNOSIS — M47812 Spondylosis without myelopathy or radiculopathy, cervical region: Secondary | ICD-10-CM | POA: Diagnosis not present

## 2023-06-14 DIAGNOSIS — M4712 Other spondylosis with myelopathy, cervical region: Secondary | ICD-10-CM

## 2023-06-14 DIAGNOSIS — M50021 Cervical disc disorder at C4-C5 level with myelopathy: Secondary | ICD-10-CM | POA: Diagnosis not present

## 2023-06-14 DIAGNOSIS — M4800 Spinal stenosis, site unspecified: Secondary | ICD-10-CM | POA: Diagnosis not present

## 2023-06-14 LAB — GLUCOSE, CAPILLARY: Glucose-Capillary: 155 mg/dL — ABNORMAL HIGH (ref 70–99)

## 2023-06-14 MED ORDER — IOHEXOL 300 MG/ML  SOLN
50.0000 mL | Freq: Once | INTRAMUSCULAR | Status: AC | PRN
  Administered 2023-06-14: 10 mL

## 2023-06-14 MED ORDER — LIDOCAINE HCL (PF) 1 % IJ SOLN
10.0000 mL | Freq: Once | INTRAMUSCULAR | Status: AC
  Administered 2023-06-14: 5 mL
  Filled 2023-06-14: qty 10

## 2023-06-14 NOTE — Procedures (Signed)
Cervical myelogram performed at L2/L3. Please see full dictation under the imaging tab in Epic.  Alwyn Ren, AGACNP-BC 06/14/2023, 11:33 AM

## 2023-06-24 ENCOUNTER — Ambulatory Visit (HOSPITAL_BASED_OUTPATIENT_CLINIC_OR_DEPARTMENT_OTHER): Payer: Medicare Other | Attending: Family Medicine | Admitting: Internal Medicine

## 2023-06-24 NOTE — Progress Notes (Unsigned)
 Referring Physician:  Sandre Kitty, MD [address] [place],  [place] [address]  Primary Physician:  Sandre Kitty, MD  History of Present Illness: 06/26/2023 [name] is here today with a chief complaint of cervical myelopathy.  He recently saw Joan Flores in clinic for concerns for neck and back pain.  She recognized signs of cervical myelopathy so got repeat imaging of his neck which showed a central disc herniation with deformation of the spinal cord.  He states that when he coughs he gets shooting pains down his back and up into the posterior head.  He also feels numbness and tingling in his hands and feet.  He will often feel his right leg drag.  He has been having difficulty with walking.  Difficulty with dropping items.  Has loss of fine motor function in his hands.  He is not having bowel or bladder issues.  He does feel worse in when his head is extended or flexed.  He has had injections.  He has not had formal physical therapy.   Conservative measures:  Physical therapy: Has not participated in Multimodal medical therapy including regular antiinflammatories:  Celebrex, ibuprofen, gabapentin Injections:  2 injections with relief   Past Surgery:   01/15/2018 Right Carpal Tunnel Release    03/07/2018:Right Lumbar Microdiscectomy    Cervical surgery, 20+ years ago     The symptoms are causing a significant impact on the patient's life.     I have utilized the care everywhere function in epic to review the outside records available from external health systems.  Review of Systems:  A 10 point review of systems is negative, except for the pertinent positives and negatives detailed in the HPI.  Past Medical History: Past Medical History:  Diagnosis Date   Arthritis    Diabetes (HCC)    type 1   GERD (gastroesophageal reflux disease)    Headache    migraines   Hypertension    Psoriasis    since age 37 years   TB lung, latent 11/06/2017    Transaminitis 01/29/2018   Tuberculosis    latent TB infection on treatment    Past Surgical History: Past Surgical History:  Procedure Laterality Date   CARPAL TUNNEL RELEASE Right 01/15/2018   Procedure: Right Carpal tunnel release;  Surgeon: Coletta Memos, MD;  Location[facility] OR;  Service: Neurosurgery;  Laterality: Right;  Right Carpal tunnel release   CERVICAL SPINE SURGERY     LUMBAR LAMINECTOMY/DECOMPRESSION MICRODISCECTOMY Right 03/07/2018   Procedure: Right Lumbar 5 Sacral 1 Microdiscectomy;  Surgeon: Coletta Memos, MD;  Location: [facility] OR;  Service: Neurosurgery;  Laterality: Right;  Right Lumbar 5 Sacral 1 Microdiscectomy   NECK SURGERY      Allergies: Allergies as of 06/26/2023   (No Known Allergies)    Medications:  Current Outpatient Medications:    amLODipine (NORVASC) 10 MG tablet, Take 10 mg by mouth daily., Disp: , Rfl:    Blood Glucose Monitoring Suppl (ONETOUCH VERIO) w/Device KIT, Inject 1 Lancet into the skin as directed., Disp: , Rfl:    clindamycin (CLEOCIN T) 1 % external solution, Apply to aa QD., Disp: 60 mL, Rfl: 5   clobetasol cream (TEMOVATE) 0.05 %, Apply to aa psoriasis QD 3d/wk. Avoid applying to face, groin, and axilla. Use as directed. Long-term use can cause thinning of the skin., Disp: 60 g, Rfl: 1   esomeprazole (NEXIUM) 20 MG capsule, Take 20 mg by mouth daily at 12 noon.,  Disp: , Rfl:    fluticasone (FLONASE) 50 MCG/ACT nasal spray, Place 2 sprays into both nostrils daily., Disp: 16 g, Rfl: 5   levocetirizine (XYZAL) 5 MG tablet, Take 1 tablet (5 mg total) by mouth every evening., Disp: 30 tablet, Rfl: 5   metoprolol succinate (TOPROL-XL) 100 MG 24 hr tablet, Take 1 tablet (100 mg total) by mouth daily. Take with or immediately following a meal., Disp: 90 tablet, Rfl: 1   montelukast (SINGULAIR) 10 MG tablet, Take 1 tablet (10 mg total) by mouth at bedtime., Disp: 30 tablet, Rfl: 3   Olmesartan-amLODIPine-HCTZ 40-10-12.5 MG TABS, Take 1 tablet by  mouth daily., Disp: 90 tablet, Rfl: 1   OneTouch Delica Lancets 33G MISC, USE TO TEST BLOOD SUGARS 6 TIMES DAILY (BEFORE MEALS AND 2 HOURS AFTER MEALS), Disp: 200 each, Rfl: 0   ONETOUCH VERIO test strip, USE TO TEST 6 TIMES DAILY (BEFORE MEALS AND 2 HOURS AFTER MEALS), Disp: 200 each, Rfl: 0   rosuvastatin (CRESTOR) 20 MG tablet, Take 1 tablet (20 mg total) by mouth daily., Disp: 90 tablet, Rfl: 3   Semaglutide, 1 MG/DOSE, (OZEMPIC, 1 MG/DOSE,) 4 MG/3ML SOPN, Inject 1 mg into the skin once a week., Disp: 9 mL, Rfl: 3   sildenafil (VIAGRA) 100 MG tablet, TAKE 1/2 (ONE-HALF) TABLET BY MOUTH 30 MINUTES PRIOR TO INTERCOURSE AS NEEDED, Disp: 30 tablet, Rfl: 0   SKYRIZI PEN 150 MG/ML pen, INJECT 150MG  ( 1 PEN) SUBCUTANEOUSLY EVERY 12 WEEKS AS DIRECTED, Disp: 1 mL, Rfl: 0  Social History: Social History   Tobacco Use   Smoking status: Every Day    Current packs/day: 1.00    Average packs/day: 1 pack/day for 15.0 years (15.0 ttl pk-yrs)    Types: Cigarettes   Smokeless tobacco: Never  Vaping Use   Vaping status: Never Used  Substance Use Topics   Alcohol use: Yes    Alcohol/week: 24.0 standard drinks of alcohol    Types: 24 Standard drinks or equivalent per week   Drug use: Never    Family Medical History: Family History  Problem Relation Age of Onset   Alcohol abuse Mother    Hypertension Mother    Lung cancer Mother    Alcohol abuse Father    Hypertension Father    Colon cancer Father        dx in his late 56's   Esophageal cancer Neg Hx    Rectal cancer Neg Hx     Physical Examination: There were no vitals filed for this visit.  General: Patient is in no apparent distress. Attention to examination is appropriate.  Neck:   Supple.  Full range of motion.  Respiratory: Patient is breathing without any difficulty.   NEUROLOGICAL:     Awake, alert, oriented to person, place, and time.  Speech is clear and fluent.   Cranial Nerves: Pupils equal round and reactive to light.   Facial tone is symmetric.  Facial sensation is symmetric. Shoulder shrug is symmetric. Tongue protrusion is midline.    Strength: Side Biceps Triceps Deltoid Interossei Grip Wrist Ext. Wrist Flex.  R 4 4+ 4 4 4+ 3 5  L 5 5 5 5 5 5 5     Side Iliopsoas Quads Hamstring PF DF EHL  R 4 5 5 5 4 4   L 5 5 5 5 5 5     Reflexes are 3+ throughout bilateral upper extremities with the exception of bilateral pectoral reflexes which are 2+.  Hoffman's is present bilaterally.  3+ patellar, 2+ Achilles.  Positive cross adductor sign.  Clonus bilaterally.  Patient has decreased sensation predominantly in right lower extremity and bilateral hands. Patient is unable to walk in a straight line, not able to safely attempt tandem walking.    Imaging: Narrative & Impression  CLINICAL DATA:  Patient with a history of cervical spondylosis with myelopathy. Patient reports he has recently been experiencing numbness in his hands and feet   EXAM: CERVICAL MYELOGRAM   CT CERVICAL MYELOGRAM   FLUOROSCOPY: Radiation exposure index as provided by the fluoroscopic device: 68.10 mGy   PROCEDURE: LUMBAR PUNCTURE FOR CERVICAL MYELOGRAM   After thorough discussion of risks and benefits of the procedure including bleeding, infection, injury to nerves, blood vessels, adjacent structures as well as headache and CSF leak, written and oral informed consent was obtained. Consent was obtained by Alwyn Ren, NP.   Patient was positioned prone on the fluoroscopy table. Local anesthesia was provided with 1% lidocaine without epinephrine after prepped and draped in the usual sterile fashion. Puncture was performed at L2-L3 using a 3 1/2 inch 22-gauge spinal needle. Using a single pass through the dura, the needle was placed within the thecal sac, with return of clear CSF. 10 mL of Isovue M-300 was injected into the thecal sac, with normal opacification of the nerve roots and cauda equina consistent with free flow  within the subarachnoid space. The patient was then moved to the trendelenburg position and contrast flowed into the cervical spine region.   I personally performed the lumbar puncture and administered the intrathecal contrast. I also personally supervised acquisition of the myelogram images.   TECHNIQUE: Contiguous axial images were obtained through the cervical spine after the intrathecal infusion of contrast. Coronal and sagittal reconstructions were obtained of the axial image sets.   FINDINGS: CERVICAL MYELOGRAM FINDINGS:   Technically successful fluoroscopy guided cervical myelogram myelogram.Intra thecal contrast was visualized to the level of the mid thoracic spine.   CT CERVICAL MYELOGRAM FINDINGS:   Alignment: Straightening of the normal cervical lordosis   Skull base and vertebrae: No acute fracture. No primary bone lesion or focal pathologic process. Interbody spacer at C5-C6.   Soft tissues and spinal canal: No prevertebral fluid or swelling. No visible canal hematoma.   Disc levels:   C1-C2: No significant degenerative change. No significant spinal canal stenosis at this level.   C2-C3: Preserved disc space. No significant facet degenerative change. No spinal canal stenosis. No neural foraminal stenosis.   C3-C4: No significant disc bulge. No facet degenerative change. No spinal canal stenosis. No neural foraminal stenosis.   C4-C5: Junctional level. There is a circumferential disc bulge with superimposed central disc protrusion and also a right foraminal disc protrusion (series 3, image 52). There is mild deformation of the ventral spinal cord, mild spinal canal narrowing, and severe right neural foraminal narrowing. Findings are new/progressed from 10/09/17 MRI C Spine   C5-C6: Fused level. No spinal canal narrowing neural foraminal narrowing. Mild bilateral facet degenerative change   C6-C7: No significant disc bulge. No facet degenerative change.  No spinal canal stenosis. No neural foraminal stenosis.   C7-T1: No significant disc bulge. No facet degenerative change. No spinal canal stenosis. No neural foraminal stenosis.   Upper chest: Not imaged   Other: None.   IMPRESSION: 1. Technically successful fluoroscopy guided cervical myelogram. 2. At C4-C5, there is a circumferential disc bulge with superimposed central disc protrusion and also a right foraminal disc protrusion. There is mild deformation of  the ventral spinal cord, mild spinal canal narrowing, and severe right neural foraminal narrowing. Findings are new/progressed from 10/09/17     Electronically Signed   By: Lorenza Cambridge M.D.   On: 06/14/2023 12:16        Narrative & Impression  CLINICAL DATA:  Initial evaluation for acute myelopathy. Neck and lower back pain radiating into arms and legs with tingling. Prior surgery.   EXAM: MRI CERVICAL SPINE WITHOUT CONTRAST   TECHNIQUE: Multiplanar, multisequence MR imaging of the cervical spine was performed. No intravenous contrast was administered.   COMPARISON:  Prior radiograph from 05/24/2023 as well as previous MRI from 10/09/2017.   FINDINGS: Alignment: Straightening of the normal cervical lordosis. No listhesis or malalignment.   Vertebrae: Susceptibility artifact related to prior interbody fusion at C5-6. Vertebral body height maintained without acute or chronic fracture. Bone marrow signal intensity within normal limits. No worrisome osseous lesions or abnormal marrow edema.   Cord: Focal cord signal abnormality with cord atrophy noted at the level of C5-6, consistent with chronic myelomalacia (series 5, image 8). Otherwise normal signal and morphology.   Posterior Fossa, vertebral arteries, paraspinal tissues: Mild patchy signal abnormality within the visualized pons, most likely related to chronic microvascular ischemic disease. Craniocervical junction within normal limits. Paraspinous  soft tissues within normal limits. Normal flow voids seen within the vertebral arteries bilaterally.   Disc levels:   C2-C3: Negative interspace. Mild bilateral facet spurring. No canal or foraminal stenosis.   C3-C4: Mild uncovertebral spurring without significant disc bulge. No spinal stenosis. Foramina remain patent.   C4-C5: Lobulated broad-based central to right paracentral disc protrusion flattens and indents the ventral thecal sac (series 8, image 15). Associated superior and inferior migration of disc material. Flattening of the ventral cord without cord signal changes, worse on the right. Mild spinal stenosis. Superimposed uncovertebral spurring with resultant mild to moderate right worse than left C5 foraminal stenosis.   C5-C6: Prior fusion. Level is somewhat obscured by susceptibility artifact. No visible significant residual spinal stenosis. Foramina appear grossly patent.   C6-C7: Degenerative intervertebral disc space narrowing with diffuse disc bulge and bilateral uncovertebral spurring. No spinal stenosis. Mild left C7 foraminal narrowing. Right neural foramen remains patent.   C7-T1: Normal interspace. Mild right greater than left facet hypertrophy. No spinal stenosis. Mild right greater than left C8 foraminal narrowing.   IMPRESSION: 1. Focal cord signal abnormality with cord atrophy at the level of C5-6, consistent with chronic myelomalacia. 2. Broad-based central to right paracentral disc protrusion at C4-5 with resultant mild spinal stenosis, with mild to moderate right worse than left C5 foraminal narrowing. 3. Prior interbody fusion at C5-6 without residual or recurrent stenosis. 4. Degenerative disc bulging with uncovertebral spurring at C6-7 with resultant mild left C7 foraminal stenosis.     Electronically Signed   By: Rise Mu M.D.   On: 06/04/2023 23:54     Narrative & Impression  CLINICAL DATA:  Recent numbness in the upper  extremity. Previous anterior cervical disc fusion.   EXAM: CERVICAL SPINE - COMPLETE 4+ VIEW   COMPARISON:  11/04/2017.   FINDINGS: Artificial disc at the C5-C6 level appears well position in seated and unchanged.   No fracture or bone lesion. No spondylolisthesis. Minor loss of disc height with endplate osteophytes at C4-C5 and C6-C7.   No subluxation with flexion or extension. No movement of the orthopedic hardware.   Bilateral carotid artery calcifications. Soft tissues are otherwise unremarkable.   IMPRESSION: 1. No fracture  or acute finding. No spondylolisthesis. No subluxation with flexion or extension. 2. Stable appearance of the prosthetic disc at the C5-C6 level.     Electronically Signed   By: Amie Portland M.D.   On: 05/30/2023 08:39    I have personally reviewed the images and agree with the above interpretation.  Although he does not have severe central stenosis at this level, a mixture of slight cervical straightening and spine draping as well as a central disc herniation causing deformation of the ventral cord.  When viewing his MRI he does appear to have myelomalacia at the 5 6 level, this does appear to be decompressed at this level however some of the myomalacia signal does appear to ascend cranially.  No evidence of instability on his flexion-extension x-rays which g continue to have him a candidate for cervical disc arthroplasty  Medical Decision Making/Assessment and Plan: Mr. Hassing is a pleasant 43 y.o. male with a history of cervical and lumbar stenosis.  He had a cervical spine surgery many years ago, he believes over 10 to 15 years ago.  He is also had recent lumbar operations as well as carpal tunnel surgeries.  He was referred to our team to be evaluated.  Joan Flores did his initial evaluation and she saw signs and symptoms consistent with cervical myelopathy.  His presentation was consistent with decreased dexterity in his hands, dragging of his  right leg, decreased ambulation ability, numbness and tingling in his hands and feet.  She obtained a CT myelogram which demonstrated a central disc herniation at C4-5 at the level immediately adjacent to his prior cervical intervention.  I reviewed his previous neurosurgical evaluation which demonstrated some 3+ reflexes but no pathologic reflexes were noted on that prior examination.  At this time it appears that he is progressing since he has bilateral clonus, bilateral Hoffmann's, spreading of his reflexes, inability to tandem ambulate due to balance issues.  He is also had significant progressive weakness of his bilateral upper extremities as well as some progression in his lower extremities.  Even when compared to his visit last month with Joan Flores, he appears to have had some progression of his weakness.  Given the findings of a cervical disc herniation and presentation consistent with progressive cervical myelopathy we feel like he would benefit from a cervical disc arthroplasty at C4-5, he has adequate disc height at this level, does not appear to have any excess motion noted.  We discussed that with his risk of prior surgery and progression as well as his history of smoking that his at higher risk for ongoing cervical symptoms.  We discussed risks of swallowing issues given his redo cervical spine surgery.  In addition to the standard risks of the procedure.  He would like to go forward given his progressive disability and weakness.  Thank you for involving me in the care of this patient.    Lovenia Kim MD/MSCR Neurosurgery

## 2023-06-24 NOTE — H&P (View-Only) (Signed)
 Referring Physician:  Sandre Kitty, MD [address] [place],  [place] [address]  Primary Physician:  Sandre Kitty, MD  History of Present Illness: 06/26/2023 Mr. Lorren Splawn is here today with a chief complaint of cervical myelopathy.  He recently saw Joan Flores in clinic for concerns for neck and back pain.  She recognized signs of cervical myelopathy so got repeat imaging of his neck which showed a central disc herniation with deformation of the spinal cord.  He states that when he coughs he gets shooting pains down his back and up into the posterior head.  He also feels numbness and tingling in his hands and feet.  He will often feel his right leg drag.  He has been having difficulty with walking.  Difficulty with dropping items.  Has loss of fine motor function in his hands.  He is not having bowel or bladder issues.  He does feel worse in when his head is extended or flexed.  He has had injections.  He has not had formal physical therapy.   Conservative measures:  Physical therapy: Has not participated in Multimodal medical therapy including regular antiinflammatories:  Celebrex, ibuprofen, gabapentin Injections:  2 injections with relief   Past Surgery:   01/15/2018 Right Carpal Tunnel Release    03/07/2018:Right Lumbar Microdiscectomy    Cervical surgery, 20+ years ago     The symptoms are causing a significant impact on the patient's life.     I have utilized the care everywhere function in epic to review the outside records available from external health systems.  Review of Systems:  A 10 point review of systems is negative, except for the pertinent positives and negatives detailed in the HPI.  Past Medical History: Past Medical History:  Diagnosis Date   Arthritis    Diabetes (HCC)    type 1   GERD (gastroesophageal reflux disease)    Headache    migraines   Hypertension    Psoriasis    since age 37 years   TB lung, latent 11/06/2017    Transaminitis 01/29/2018   Tuberculosis    latent TB infection on treatment    Past Surgical History: Past Surgical History:  Procedure Laterality Date   CARPAL TUNNEL RELEASE Right 01/15/2018   Procedure: Right Carpal tunnel release;  Surgeon: Coletta Memos, MD;  Location: [facility] OR;  Service: Neurosurgery;  Laterality: Right;  Right Carpal tunnel release   CERVICAL SPINE SURGERY     LUMBAR LAMINECTOMY/DECOMPRESSION MICRODISCECTOMY Right 03/07/2018   Procedure: Right Lumbar 5 Sacral 1 Microdiscectomy;  Surgeon: Coletta Memos, MD;  Location: [facility] OR;  Service: Neurosurgery;  Laterality: Right;  Right Lumbar 5 Sacral 1 Microdiscectomy   NECK SURGERY      Allergies: Allergies as of 06/26/2023   (No Known Allergies)    Medications:  Current Outpatient Medications:    amLODipine (NORVASC) 10 MG tablet, Take 10 mg by mouth daily., Disp: , Rfl:    Blood Glucose Monitoring Suppl (ONETOUCH VERIO) w/Device KIT, Inject 1 Lancet into the skin as directed., Disp: , Rfl:    clindamycin (CLEOCIN T) 1 % external solution, Apply to aa QD., Disp: 60 mL, Rfl: 5   clobetasol cream (TEMOVATE) 0.05 %, Apply to aa psoriasis QD 3d/wk. Avoid applying to face, groin, and axilla. Use as directed. Long-term use can cause thinning of the skin., Disp: 60 g, Rfl: 1   esomeprazole (NEXIUM) 20 MG capsule, Take 20 mg by mouth daily at 12 noon.,  Disp: , Rfl:    fluticasone (FLONASE) 50 MCG/ACT nasal spray, Place 2 sprays into both nostrils daily., Disp: 16 g, Rfl: 5   levocetirizine (XYZAL) 5 MG tablet, Take 1 tablet (5 mg total) by mouth every evening., Disp: 30 tablet, Rfl: 5   metoprolol succinate (TOPROL-XL) 100 MG 24 hr tablet, Take 1 tablet (100 mg total) by mouth daily. Take with or immediately following a meal., Disp: 90 tablet, Rfl: 1   montelukast (SINGULAIR) 10 MG tablet, Take 1 tablet (10 mg total) by mouth at bedtime., Disp: 30 tablet, Rfl: 3   Olmesartan-amLODIPine-HCTZ 40-10-12.5 MG TABS, Take 1 tablet by  mouth daily., Disp: 90 tablet, Rfl: 1   OneTouch Delica Lancets 33G MISC, USE TO TEST BLOOD SUGARS 6 TIMES DAILY (BEFORE MEALS AND 2 HOURS AFTER MEALS), Disp: 200 each, Rfl: 0   ONETOUCH VERIO test strip, USE TO TEST 6 TIMES DAILY (BEFORE MEALS AND 2 HOURS AFTER MEALS), Disp: 200 each, Rfl: 0   rosuvastatin (CRESTOR) 20 MG tablet, Take 1 tablet (20 mg total) by mouth daily., Disp: 90 tablet, Rfl: 3   Semaglutide, 1 MG/DOSE, (OZEMPIC, 1 MG/DOSE,) 4 MG/3ML SOPN, Inject 1 mg into the skin once a week., Disp: 9 mL, Rfl: 3   sildenafil (VIAGRA) 100 MG tablet, TAKE 1/2 (ONE-HALF) TABLET BY MOUTH 30 MINUTES PRIOR TO INTERCOURSE AS NEEDED, Disp: 30 tablet, Rfl: 0   SKYRIZI PEN 150 MG/ML pen, INJECT 150MG  ( 1 PEN) SUBCUTANEOUSLY EVERY 12 WEEKS AS DIRECTED, Disp: 1 mL, Rfl: 0  Social History: Social History   Tobacco Use   Smoking status: Every Day    Current packs/day: 1.00    Average packs/day: 1 pack/day for 15.0 years (15.0 ttl pk-yrs)    Types: Cigarettes   Smokeless tobacco: Never  Vaping Use   Vaping status: Never Used  Substance Use Topics   Alcohol use: Yes    Alcohol/week: 24.0 standard drinks of alcohol    Types: 24 Standard drinks or equivalent per week   Drug use: Never    Family Medical History: Family History  Problem Relation Age of Onset   Alcohol abuse Mother    Hypertension Mother    Lung cancer Mother    Alcohol abuse Father    Hypertension Father    Colon cancer Father        dx in his late 56's   Esophageal cancer Neg Hx    Rectal cancer Neg Hx     Physical Examination: There were no vitals filed for this visit.  General: Patient is in no apparent distress. Attention to examination is appropriate.  Neck:   Supple.  Full range of motion.  Respiratory: Patient is breathing without any difficulty.   NEUROLOGICAL:     Awake, alert, oriented to person, place, and time.  Speech is clear and fluent.   Cranial Nerves: Pupils equal round and reactive to light.   Facial tone is symmetric.  Facial sensation is symmetric. Shoulder shrug is symmetric. Tongue protrusion is midline.    Strength: Side Biceps Triceps Deltoid Interossei Grip Wrist Ext. Wrist Flex.  R 4 4+ 4 4 4+ 3 5  L 5 5 5 5 5 5 5     Side Iliopsoas Quads Hamstring PF DF EHL  R 4 5 5 5 4 4   L 5 5 5 5 5 5     Reflexes are 3+ throughout bilateral upper extremities with the exception of bilateral pectoral reflexes which are 2+.  Hoffman's is present bilaterally.  3+ patellar, 2+ Achilles.  Positive cross adductor sign.  Clonus bilaterally.  Patient has decreased sensation predominantly in right lower extremity and bilateral hands. Patient is unable to walk in a straight line, not able to safely attempt tandem walking.    Imaging: Narrative & Impression  CLINICAL DATA:  Patient with a history of cervical spondylosis with myelopathy. Patient reports he has recently been experiencing numbness in his hands and feet   EXAM: CERVICAL MYELOGRAM   CT CERVICAL MYELOGRAM   FLUOROSCOPY: Radiation exposure index as provided by the fluoroscopic device: 68.10 mGy   PROCEDURE: LUMBAR PUNCTURE FOR CERVICAL MYELOGRAM   After thorough discussion of risks and benefits of the procedure including bleeding, infection, injury to nerves, blood vessels, adjacent structures as well as headache and CSF leak, written and oral informed consent was obtained. Consent was obtained by Alwyn Ren, NP.   Patient was positioned prone on the fluoroscopy table. Local anesthesia was provided with 1% lidocaine without epinephrine after prepped and draped in the usual sterile fashion. Puncture was performed at L2-L3 using a 3 1/2 inch 22-gauge spinal needle. Using a single pass through the dura, the needle was placed within the thecal sac, with return of clear CSF. 10 mL of Isovue M-300 was injected into the thecal sac, with normal opacification of the nerve roots and cauda equina consistent with free flow  within the subarachnoid space. The patient was then moved to the trendelenburg position and contrast flowed into the cervical spine region.   I personally performed the lumbar puncture and administered the intrathecal contrast. I also personally supervised acquisition of the myelogram images.   TECHNIQUE: Contiguous axial images were obtained through the cervical spine after the intrathecal infusion of contrast. Coronal and sagittal reconstructions were obtained of the axial image sets.   FINDINGS: CERVICAL MYELOGRAM FINDINGS:   Technically successful fluoroscopy guided cervical myelogram myelogram.Intra thecal contrast was visualized to the level of the mid thoracic spine.   CT CERVICAL MYELOGRAM FINDINGS:   Alignment: Straightening of the normal cervical lordosis   Skull base and vertebrae: No acute fracture. No primary bone lesion or focal pathologic process. Interbody spacer at C5-C6.   Soft tissues and spinal canal: No prevertebral fluid or swelling. No visible canal hematoma.   Disc levels:   C1-C2: No significant degenerative change. No significant spinal canal stenosis at this level.   C2-C3: Preserved disc space. No significant facet degenerative change. No spinal canal stenosis. No neural foraminal stenosis.   C3-C4: No significant disc bulge. No facet degenerative change. No spinal canal stenosis. No neural foraminal stenosis.   C4-C5: Junctional level. There is a circumferential disc bulge with superimposed central disc protrusion and also a right foraminal disc protrusion (series 3, image 52). There is mild deformation of the ventral spinal cord, mild spinal canal narrowing, and severe right neural foraminal narrowing. Findings are new/progressed from 10/09/17 MRI C Spine   C5-C6: Fused level. No spinal canal narrowing neural foraminal narrowing. Mild bilateral facet degenerative change   C6-C7: No significant disc bulge. No facet degenerative change.  No spinal canal stenosis. No neural foraminal stenosis.   C7-T1: No significant disc bulge. No facet degenerative change. No spinal canal stenosis. No neural foraminal stenosis.   Upper chest: Not imaged   Other: None.   IMPRESSION: 1. Technically successful fluoroscopy guided cervical myelogram. 2. At C4-C5, there is a circumferential disc bulge with superimposed central disc protrusion and also a right foraminal disc protrusion. There is mild deformation of  the ventral spinal cord, mild spinal canal narrowing, and severe right neural foraminal narrowing. Findings are new/progressed from 10/09/17     Electronically Signed   By: Lorenza Cambridge M.D.   On: 06/14/2023 12:16        Narrative & Impression  CLINICAL DATA:  Initial evaluation for acute myelopathy. Neck and lower back pain radiating into arms and legs with tingling. Prior surgery.   EXAM: MRI CERVICAL SPINE WITHOUT CONTRAST   TECHNIQUE: Multiplanar, multisequence MR imaging of the cervical spine was performed. No intravenous contrast was administered.   COMPARISON:  Prior radiograph from 05/24/2023 as well as previous MRI from 10/09/2017.   FINDINGS: Alignment: Straightening of the normal cervical lordosis. No listhesis or malalignment.   Vertebrae: Susceptibility artifact related to prior interbody fusion at C5-6. Vertebral body height maintained without acute or chronic fracture. Bone marrow signal intensity within normal limits. No worrisome osseous lesions or abnormal marrow edema.   Cord: Focal cord signal abnormality with cord atrophy noted at the level of C5-6, consistent with chronic myelomalacia (series 5, image 8). Otherwise normal signal and morphology.   Posterior Fossa, vertebral arteries, paraspinal tissues: Mild patchy signal abnormality within the visualized pons, most likely related to chronic microvascular ischemic disease. Craniocervical junction within normal limits. Paraspinous  soft tissues within normal limits. Normal flow voids seen within the vertebral arteries bilaterally.   Disc levels:   C2-C3: Negative interspace. Mild bilateral facet spurring. No canal or foraminal stenosis.   C3-C4: Mild uncovertebral spurring without significant disc bulge. No spinal stenosis. Foramina remain patent.   C4-C5: Lobulated broad-based central to right paracentral disc protrusion flattens and indents the ventral thecal sac (series 8, image 15). Associated superior and inferior migration of disc material. Flattening of the ventral cord without cord signal changes, worse on the right. Mild spinal stenosis. Superimposed uncovertebral spurring with resultant mild to moderate right worse than left C5 foraminal stenosis.   C5-C6: Prior fusion. Level is somewhat obscured by susceptibility artifact. No visible significant residual spinal stenosis. Foramina appear grossly patent.   C6-C7: Degenerative intervertebral disc space narrowing with diffuse disc bulge and bilateral uncovertebral spurring. No spinal stenosis. Mild left C7 foraminal narrowing. Right neural foramen remains patent.   C7-T1: Normal interspace. Mild right greater than left facet hypertrophy. No spinal stenosis. Mild right greater than left C8 foraminal narrowing.   IMPRESSION: 1. Focal cord signal abnormality with cord atrophy at the level of C5-6, consistent with chronic myelomalacia. 2. Broad-based central to right paracentral disc protrusion at C4-5 with resultant mild spinal stenosis, with mild to moderate right worse than left C5 foraminal narrowing. 3. Prior interbody fusion at C5-6 without residual or recurrent stenosis. 4. Degenerative disc bulging with uncovertebral spurring at C6-7 with resultant mild left C7 foraminal stenosis.     Electronically Signed   By: Rise Mu M.D.   On: 06/04/2023 23:54     Narrative & Impression  CLINICAL DATA:  Recent numbness in the upper  extremity. Previous anterior cervical disc fusion.   EXAM: CERVICAL SPINE - COMPLETE 4+ VIEW   COMPARISON:  11/04/2017.   FINDINGS: Artificial disc at the C5-C6 level appears well position in seated and unchanged.   No fracture or bone lesion. No spondylolisthesis. Minor loss of disc height with endplate osteophytes at C4-C5 and C6-C7.   No subluxation with flexion or extension. No movement of the orthopedic hardware.   Bilateral carotid artery calcifications. Soft tissues are otherwise unremarkable.   IMPRESSION: 1. No fracture  or acute finding. No spondylolisthesis. No subluxation with flexion or extension. 2. Stable appearance of the prosthetic disc at the C5-C6 level.     Electronically Signed   By: Amie Portland M.D.   On: 05/30/2023 08:39    I have personally reviewed the images and agree with the above interpretation.  Although he does not have severe central stenosis at this level, a mixture of slight cervical straightening and spine draping as well as a central disc herniation causing deformation of the ventral cord.  When viewing his MRI he does appear to have myelomalacia at the 5 6 level, this does appear to be decompressed at this level however some of the myomalacia signal does appear to ascend cranially.  No evidence of instability on his flexion-extension x-rays which g continue to have him a candidate for cervical disc arthroplasty  Medical Decision Making/Assessment and Plan: Mr. Hassing is a pleasant 43 y.o. male with a history of cervical and lumbar stenosis.  He had a cervical spine surgery many years ago, he believes over 10 to 15 years ago.  He is also had recent lumbar operations as well as carpal tunnel surgeries.  He was referred to our team to be evaluated.  Joan Flores did his initial evaluation and she saw signs and symptoms consistent with cervical myelopathy.  His presentation was consistent with decreased dexterity in his hands, dragging of his  right leg, decreased ambulation ability, numbness and tingling in his hands and feet.  She obtained a CT myelogram which demonstrated a central disc herniation at C4-5 at the level immediately adjacent to his prior cervical intervention.  I reviewed his previous neurosurgical evaluation which demonstrated some 3+ reflexes but no pathologic reflexes were noted on that prior examination.  At this time it appears that he is progressing since he has bilateral clonus, bilateral Hoffmann's, spreading of his reflexes, inability to tandem ambulate due to balance issues.  He is also had significant progressive weakness of his bilateral upper extremities as well as some progression in his lower extremities.  Even when compared to his visit last month with Joan Flores, he appears to have had some progression of his weakness.  Given the findings of a cervical disc herniation and presentation consistent with progressive cervical myelopathy we feel like he would benefit from a cervical disc arthroplasty at C4-5, he has adequate disc height at this level, does not appear to have any excess motion noted.  We discussed that with his risk of prior surgery and progression as well as his history of smoking that his at higher risk for ongoing cervical symptoms.  We discussed risks of swallowing issues given his redo cervical spine surgery.  In addition to the standard risks of the procedure.  He would like to go forward given his progressive disability and weakness.  Thank you for involving me in the care of this patient.    Lovenia Kim MD/MSCR Neurosurgery

## 2023-06-26 ENCOUNTER — Encounter: Payer: Self-pay | Admitting: Neurosurgery

## 2023-06-26 ENCOUNTER — Telehealth: Payer: Self-pay

## 2023-06-26 ENCOUNTER — Ambulatory Visit (INDEPENDENT_AMBULATORY_CARE_PROVIDER_SITE_OTHER): Payer: Medicare Other | Admitting: Neurosurgery

## 2023-06-26 VITALS — BP 134/84 | Ht 68.0 in | Wt 213.0 lb

## 2023-06-26 DIAGNOSIS — R2681 Unsteadiness on feet: Secondary | ICD-10-CM | POA: Diagnosis not present

## 2023-06-26 DIAGNOSIS — R292 Abnormal reflex: Secondary | ICD-10-CM | POA: Insufficient documentation

## 2023-06-26 DIAGNOSIS — R200 Anesthesia of skin: Secondary | ICD-10-CM | POA: Insufficient documentation

## 2023-06-26 DIAGNOSIS — G959 Disease of spinal cord, unspecified: Secondary | ICD-10-CM | POA: Diagnosis not present

## 2023-06-26 DIAGNOSIS — M50221 Other cervical disc displacement at C4-C5 level: Secondary | ICD-10-CM

## 2023-06-26 DIAGNOSIS — M4712 Other spondylosis with myelopathy, cervical region: Secondary | ICD-10-CM

## 2023-06-26 NOTE — Telephone Encounter (Signed)
 I spoke with Louis Carney. We discussed the information below. He would like a telephone visit with Dr Katrinka Blazing to discuss the surgery in more detail. I have scheduled this for next week. We will plan for surgery on 07/11/23.   Planned surgery: C4-5 arthroplasty   Surgery date: 07/11/23 at [facility]: [address] [place], [place] [address]) - you will find out your arrival time the business day before your surgery.   Pre-op appointment at [facility] Pre-admit Testing: we will call you with a date/time for this. If you are scheduled for an in person appointment, Pre-admit Testing is located on the first floor of the [facility] building, [address], [address]. Please bring all prescriptions in the original prescription bottles to your appointment. During this appointment, they will advise you which medications you can take the morning of surgery, and which medications you will need to hold for surgery. Labs (such as blood work, EKG) may be done at your pre-op appointment. You are not required to fast for these labs. Should you need to change your pre-op appointment, please call Pre-admit testing at [phone].     Diabetes/weight loss medications: Per anesthesia guidelines (due to the increased risk of aspiration caused by delayed gastric emptying):  Semaglutide (Ozempic, Rybelsus, Wegovy) injections: hold 7 days prior to surgery    Common restrictions after surgery: No bending, lifting, or twisting ("BLT"). Avoid lifting objects heavier than 10 pounds for the first 6 weeks after surgery. Where possible, avoid household activities that involve lifting, bending, reaching, pushing, or pulling such as laundry, vacuuming, grocery shopping, and childcare. Try to arrange for help from friends and family for these activities while you heal. Do not drive while taking prescription pain medication. Weeks 6 through 12 after surgery: avoid lifting  more than 25 pounds.    X-rays after surgery: Because you are having a fusion or arthroplasty: for appointments after your 2 week follow-up: please arrive at the [facility] outpatient imaging center ([address], [address], [place]) or [facility] one hour prior to your appointment for x-rays. This applies to every appointment after your 2 week follow-up. Failure to do so may result in your appointment being rescheduled.   How to contact us:  If you have any questions/concerns before or after surgery, you can reach [place] at [phone], or you can send a mychart message. We can be reached by phone or mychart 8am-4pm, Monday-Friday.  *Please note: Calls after 4pm are forwarded to a third party answering service. Mychart messages are not routinely monitored during evenings, weekends, and holidays. Please call our office to contact the answering service for urgent concerns during non-business hours.    If you have FMLA/disability paperwork, please drop it off or fax it to [phone], attention Patty.   Appointments/FMLA & disability paperwork: Joycelyn Rua, & Flonnie Hailstone Registered Nurse/Surgery scheduler: Royston Cowper Medical Assistants: Nash Mantis Physician Assistants: Joan Flores, PA-C, Manning Charity, PA-C & Drake Leach, PA-C Surgeons: Venetia Night, MD & Ernestine Mcmurray, MD

## 2023-06-27 ENCOUNTER — Other Ambulatory Visit: Payer: Self-pay

## 2023-06-27 DIAGNOSIS — Z01818 Encounter for other preprocedural examination: Secondary | ICD-10-CM

## 2023-07-01 ENCOUNTER — Ambulatory Visit (INDEPENDENT_AMBULATORY_CARE_PROVIDER_SITE_OTHER): Payer: Medicare Other | Admitting: Neurosurgery

## 2023-07-01 DIAGNOSIS — M50021 Cervical disc disorder at C4-C5 level with myelopathy: Secondary | ICD-10-CM | POA: Diagnosis not present

## 2023-07-01 DIAGNOSIS — M4712 Other spondylosis with myelopathy, cervical region: Secondary | ICD-10-CM

## 2023-07-01 NOTE — Progress Notes (Signed)
 I had a follow-up phone visit today with Louis Carney.  He was at home and I was in the office.  He gave consent to go forward with a phone visit.  We discussed his cervical myelopathy and C4-5 disc herniation.  We discussed that this would be very similar to his prior cervical surgery.  Would approach him from the front and then remove his disc and the parts of the bone that are pressing on the spinal cord.  We then placed a spacer in between the 2 bones to help maintain their height.  The goal for the surgery would be to keep his myelopathy or spinal cord compression from continuing to worsen.  His questions were answered to his approval.  We spent approximately 10 minutes on the phone call.

## 2023-07-03 ENCOUNTER — Encounter
Admission: RE | Admit: 2023-07-03 | Discharge: 2023-07-03 | Disposition: A | Discharge status: Home / Self Care | Payer: Medicare Other | Source: Ambulatory Visit | Attending: Neurosurgery | Admitting: Neurosurgery

## 2023-07-03 ENCOUNTER — Other Ambulatory Visit: Payer: Self-pay

## 2023-07-03 VITALS — BP 149/97 | HR 69 | Temp 97.3°F | Resp 15 | Ht 68.0 in | Wt 217.0 lb

## 2023-07-03 DIAGNOSIS — E1169 Type 2 diabetes mellitus with other specified complication: Secondary | ICD-10-CM | POA: Diagnosis not present

## 2023-07-03 DIAGNOSIS — Z01812 Encounter for preprocedural laboratory examination: Secondary | ICD-10-CM

## 2023-07-03 DIAGNOSIS — Z01810 Encounter for preprocedural cardiovascular examination: Secondary | ICD-10-CM

## 2023-07-03 DIAGNOSIS — E1159 Type 2 diabetes mellitus with other circulatory complications: Secondary | ICD-10-CM | POA: Insufficient documentation

## 2023-07-03 DIAGNOSIS — I152 Hypertension secondary to endocrine disorders: Secondary | ICD-10-CM | POA: Insufficient documentation

## 2023-07-03 DIAGNOSIS — E785 Hyperlipidemia, unspecified: Secondary | ICD-10-CM | POA: Insufficient documentation

## 2023-07-03 DIAGNOSIS — Z01818 Encounter for other preprocedural examination: Secondary | ICD-10-CM | POA: Diagnosis not present

## 2023-07-03 HISTORY — DX: Alcohol abuse, uncomplicated: F10.10

## 2023-07-03 HISTORY — DX: Type 2 diabetes mellitus with hyperglycemia: E11.65

## 2023-07-03 HISTORY — DX: Pneumonia, unspecified organism: J18.9

## 2023-07-03 HISTORY — DX: Disease of spinal cord, unspecified: G95.9

## 2023-07-03 HISTORY — DX: Chronic sinusitis, unspecified: J32.9

## 2023-07-03 HISTORY — DX: Other cervical disc displacement, unspecified cervical region: M50.20

## 2023-07-03 HISTORY — DX: Mixed hyperlipidemia: E78.2

## 2023-07-03 HISTORY — DX: Type 2 diabetes mellitus with other circulatory complications: E11.59

## 2023-07-03 HISTORY — DX: Hypertension secondary to endocrine disorders: I15.2

## 2023-07-03 LAB — BASIC METABOLIC PANEL
Anion gap: 9 (ref 5–15)
BUN: 14 mg/dL (ref 6–20)
CO2: 25 mmol/L (ref 22–32)
Calcium: 9.3 mg/dL (ref 8.9–10.3)
Chloride: 103 mmol/L (ref 98–111)
Creatinine, Ser: 0.65 mg/dL (ref 0.61–1.24)
GFR, Estimated: >60 mL/min (ref >60)
Glucose, Bld: 92 mg/dL (ref 70–99)
Potassium: 3.8 mmol/L (ref 3.5–5.1)
Sodium: 137 mmol/L (ref 135–145)

## 2023-07-03 LAB — CBC
HCT: 43.2 % (ref 39.0–52.0)
Hemoglobin: 15.5 g/dL (ref 13.0–17.0)
MCH: 31.6 pg (ref 26.0–34.0)
MCHC: 35.9 g/dL (ref 30.0–36.0)
MCV: 88.2 fL (ref 80.0–100.0)
Platelets: 259 10*3/uL (ref 150–400)
RBC: 4.9 MIL/uL (ref 4.22–5.81)
RDW: 11.9 % (ref 11.5–15.5)
WBC: 7.9 10*3/uL (ref 4.0–10.5)
nRBC: 0 % (ref 0.0–0.2)

## 2023-07-03 LAB — TYPE AND SCREEN
ABO/RH(D): O POS
Antibody Screen: NEGATIVE

## 2023-07-03 LAB — SURGICAL PCR SCREEN
MRSA, PCR: NEGATIVE
Staphylococcus aureus: NEGATIVE

## 2023-07-03 NOTE — Patient Instructions (Addendum)
 Your procedure is scheduled on: Thursday, March 13 Report to the Registration Desk on the 1st floor of the [facility] To find out your arrival time, please call [phone] between 1PM - 3PM on: Wednesday, March 12 If your arrival time is 6:00 am, do not arrive before that time as the [facility] entrance doors do not open until 6:00 am.  REMEMBER: Instructions that are not followed completely may result in serious medical risk, up to and including death; or upon the discretion of your surgeon and anesthesiologist your surgery may need to be rescheduled.  Do not eat food after midnight the night before surgery.  No gum chewing or hard candies.  You may however, drink water up to 2 hours before you are scheduled to arrive for your surgery. Do not drink anything within 2 hours of your scheduled arrival time.  One week prior to surgery: starting March 6 Stop Anti-inflammatories (NSAIDS) such as Advil, Aleve, Ibuprofen, Motrin, Naproxen, Naprosyn and Aspirin based products such as Excedrin, Goody's Powder, BC Powder. Stop ANY OVER THE COUNTER supplements until after surgery.  You may however, continue to take Tylenol if needed for pain up until the day of surgery.  Sildenafil (Viagra) hold for 2 days before surgery.  Ozempic - hold for 7 days before surgery. Do not take on Sunday, March 9. Resume AFTER surgery on your regular weekly day, Sunday, March 16  Continue taking all of your other prescription medications up until the day of surgery.  ON THE DAY OF SURGERY DO NOT TAKE ANY MEDICATIONS   No Alcohol for 24 hours before or after surgery.  No Smoking including e-cigarettes for 24 hours before surgery.  No chewable tobacco products for at least 6 hours before surgery.  No nicotine patches on the day of surgery.  Do not use any "recreational" drugs for at least a week (preferably 2 weeks) before your surgery.  Please be advised that the combination of cocaine and anesthesia may  have negative outcomes, up to and including death. If you test positive for cocaine, your surgery will be cancelled.  On the morning of surgery brush your teeth with toothpaste and water, you may rinse your mouth with mouthwash if you wish. Do not swallow any toothpaste or mouthwash.  Use CHG Soap as directed on instruction sheet.  Do not wear jewelry, make-up, hairpins, clips or nail polish.  For welded (permanent) jewelry: bracelets, anklets, waist bands, etc.  Please have this removed prior to surgery.  If it is not removed, there is a chance that hospital personnel will need to cut it off on the day of surgery.  Do not wear lotions, powders, or perfumes.   Do not shave body hair from the neck down 48 hours before surgery.  Contact lenses, hearing aids and dentures may not be worn into surgery.  Do not bring valuables to the hospital. [facility] is not responsible for any missing/lost belongings or valuables.   Notify your doctor if there is any change in your medical condition (cold, fever, infection).  Wear comfortable clothing (specific to your surgery type) to the hospital.  After surgery, you can help prevent lung complications by doing breathing exercises.  Take deep breaths and cough every 1-2 hours.   If you are being discharged the day of surgery, you will not be allowed to drive home. You will need a responsible individual to drive you home and stay with you for 24 hours after surgery.   If you  are taking public transportation, you will need to have a responsible individual with you.  Please call the Pre-admissions Testing Dept. at [phone] if you have any questions about these instructions.  Surgery Visitation Policy:  Patients having surgery or a procedure may have two visitors.  Children under the age of 59 must have an adult with them who is not the patient.  Temporary Visitor Restrictions Due to increasing cases of flu, RSV and COVID-19: Children ages  54 and under will not be able to visit patients in [facility] hospitals under most circumstances.     Pre-operative 5 CHG Bath Instructions   You can play a key role in reducing the risk of infection after surgery. Your skin needs to be as free of germs as possible. You can reduce the number of germs on your skin by washing with CHG (chlorhexidine gluconate) soap before surgery. CHG is an antiseptic soap that kills germs and continues to kill germs even after washing.   DO NOT use if you have an allergy to chlorhexidine/CHG or antibacterial soaps. If your skin becomes reddened or irritated, stop using the CHG and notify one of our RNs at [phone].   Please shower with the CHG soap starting 4 days before surgery using the following schedule:     Please keep in mind the following:  DO NOT shave, including legs and underarms, starting the day of your first shower.   You may shave your face at any point before/day of surgery.  Place clean sheets on your bed the day you start using CHG soap. Use a clean washcloth (not used since being washed) for each shower. DO NOT sleep with pets once you start using the CHG.   CHG Shower Instructions:  If you choose to wash your hair and private area, wash first with your normal shampoo/soap.  After you use shampoo/soap, rinse your hair and body thoroughly to remove shampoo/soap residue.  Turn the water OFF and apply about 3 tablespoons (45 ml) of CHG soap to a CLEAN washcloth.  Apply CHG soap ONLY FROM YOUR NECK DOWN TO YOUR TOES (washing for 3-5 minutes)  DO NOT use CHG soap on face, private areas, open wounds, or sores.  Pay special attention to the area where your surgery is being performed.  If you are having back surgery, having someone wash your back for you may be helpful. Wait 2 minutes after CHG soap is applied, then you may rinse off the CHG soap.  Pat dry with a clean towel  Put on clean clothes/pajamas   If you choose to wear lotion,  please use ONLY the CHG-compatible lotions on the back of this paper.     Additional instructions for the day of surgery: DO NOT APPLY any lotions, deodorants, cologne, or perfumes.   Put on clean/comfortable clothes.  Brush your teeth.  Ask your nurse before applying any prescription medications to the skin.      CHG Compatible Lotions   Aveeno Moisturizing lotion  Cetaphil Moisturizing Cream  Cetaphil Moisturizing Lotion  Clairol Herbal Essence Moisturizing Lotion, Dry Skin  Clairol Herbal Essence Moisturizing Lotion, Extra Dry Skin  Clairol Herbal Essence Moisturizing Lotion, Normal Skin  Curel Age Defying Therapeutic Moisturizing Lotion with Alpha Hydroxy  Curel Extreme Care Body Lotion  Curel Soothing Hands Moisturizing Hand Lotion  Curel Therapeutic Moisturizing Cream, Fragrance-Free  Curel Therapeutic Moisturizing Lotion, Fragrance-Free  Curel Therapeutic Moisturizing Lotion, Original Formula  Eucerin Daily Replenishing Lotion  Eucerin Dry Skin Therapy  Plus Alpha Hydroxy Crme  Eucerin Dry Skin Therapy Plus Alpha Hydroxy Lotion  Eucerin Original Crme  Eucerin Original Lotion  Eucerin Plus Crme Eucerin Plus Lotion  Eucerin TriLipid Replenishing Lotion  Keri Anti-Bacterial Hand Lotion  Keri Deep Conditioning Original Lotion Dry Skin Formula Softly Scented  Keri Deep Conditioning Original Lotion, Fragrance Free Sensitive Skin Formula  Keri Lotion Fast Absorbing Fragrance Free Sensitive Skin Formula  Keri Lotion Fast Absorbing Softly Scented Dry Skin Formula  Keri Original Lotion  Keri Skin Renewal Lotion Keri Silky Smooth Lotion  Keri Silky Smooth Sensitive Skin Lotion  Nivea Body Creamy Conditioning Oil  Nivea Body Extra Enriched Teacher, adult education Moisturizing Lotion Nivea Crme  Nivea Skin Firming Lotion  NutraDerm 30 Skin Lotion  NutraDerm Skin Lotion  NutraDerm Therapeutic Skin Cream  NutraDerm Therapeutic Skin Lotion   ProShield Protective Hand Cream  Provon moisturizing lotion

## 2023-07-04 ENCOUNTER — Ambulatory Visit: Payer: Medicare Other | Admitting: Dermatology

## 2023-07-05 NOTE — Progress Notes (Signed)
  Perioperative Services Pre-Admission/Anesthesia Testing    Date: 07/05/23  Name: Louis Carney MRN:   [id number]  Re: GLP-1 clearance and provider recommendations   Planned Surgical Procedure(s):    Case: [id number] Date/Time: 07/11/23 0915   Procedure: C4-5 ARTHROPLASTY   Anesthesia type: General   Pre-op diagnosis:      M47.12 Cervical spondylosis with myelopathy      R20.0 Numbness and tingling     R26.81 Gait instability     R29.2 Hyperreflexia   Location: [facility] OR ROOM 03 / [facility]   Surgeons: Lovenia Kim, MD      Clinical Notes:  Patient is scheduled for the above procedure with the indicated provider/surgeon. In review of his medication reconciliation it was noted that patient is on a prescribed GLP-1 medication. Per guidelines issued by the American Society of Anesthesiologists (ASA), it is recommended that these medications be held for 7 days prior to the patient undergoing any type of elective surgical procedure. The patient is taking the following GLP-1 medication:  [x]  SEMAGLUTIDE   []  EXENATIDE  []  LIRAGLUTIDE   []  LIXISENATIDE  []  DULAGLUTIDE     []  TIRZEPATIDE (GLP-1/GIP)  Reached out to prescribing provider Elvera Lennox, MD) to make them aware of the guidelines from anesthesia. Given that this patient takes the prescribed GLP-1 medication for his  diabetes diagnosis, rather than for weight loss, recommendations from the prescribing provider were solicited. Prescribing provider made aware of the following so that informed decision/POC can be developed for this patient that may be taking medications belonging to these drug classes:  Oral GLP-1 medications will be held 1 day prior to surgery.  Injectable GLP-1 medications will be held 7 days prior to surgery.  Metformin is routinely held 48 hours prior to surgery due to renal concerns, potential need for contrasted imaging perioperatively, and the potential for tissue hypoxia leading to drug  induced lactic acidosis.  All SGLT2i medications are held 72 hours prior to surgery as they can be associated with the increased potential for developing euglycemic diabetic ketoacidosis (EDKA).   Impression and Plan:  Louis Carney is on a prescribed GLP-1 medication, which induces the known side effect of decreased gastric emptying. Efforts are bring made to mitigate the risk of perioperative hyperglycemic events, as elevated blood glucose levels have been found to contribute to intra/postoperative complications. Additionally, hyperglycemic extremes can potentially necessitate the postponing of a patient's elective case in order to better optimize perioperative glycemic control, again with the aforementioned guidelines in place. With this in mind, recommendations have been sought from the prescribing provider, who has cleared patient to proceed with holding the prescribed GLP-1 as per the guidelines from the ASA.   Provider recommending: no further recommendations received from the prescribing provider.  Copy of signed clearance and recommendations placed on patient's chart for inclusion in their medical record and for review by the surgical/anesthetic team on the day of his procedure.   Quentin Mulling, MSN, APRN, FNP-C, CEN [facility]  Perioperative Services Nurse Practitioner Phone: [phone] Fax: [phone] 07/05/23 5:06 PM  NOTE: This note has been prepared using Dragon dictation software. Despite my best ability to proofread, there is always the potential that unintentional transcriptional errors may still occur from this process.

## 2023-07-08 ENCOUNTER — Ambulatory Visit: Admitting: Dermatology

## 2023-07-10 MED ORDER — SODIUM CHLORIDE 0.9 % IV SOLN: INTRAVENOUS | Status: DC

## 2023-07-10 MED ORDER — CHLORHEXIDINE GLUCONATE 0.12 % MT SOLN
15.0000 mL | Freq: Once | OROMUCOSAL | Status: AC
  Administered 2023-07-11: 15 mL via OROMUCOSAL

## 2023-07-10 MED ORDER — ORAL CARE MOUTH RINSE: 15.0000 mL | Freq: Once | OROMUCOSAL | Status: AC

## 2023-07-10 MED ORDER — CEFAZOLIN IN SODIUM CHLORIDE 2-0.9 GM/100ML-% IV SOLN
2.0000 g | Freq: Once | INTRAVENOUS | Status: DC
Start: 2023-07-10 — End: 2023-07-11
  Filled 2023-07-10: qty 100

## 2023-07-11 ENCOUNTER — Ambulatory Visit

## 2023-07-11 ENCOUNTER — Ambulatory Visit
Admission: RE | Admit: 2023-07-11 | Discharge: 2023-07-11 | Disposition: A | Discharge status: Home / Self Care | Payer: Medicare Other | Source: Ambulatory Visit | Attending: Neurosurgery | Admitting: Neurosurgery

## 2023-07-11 ENCOUNTER — Encounter
Admission: RE | Disposition: A | Discharge status: Home / Self Care | Payer: Self-pay | Source: Ambulatory Visit | Attending: Neurosurgery

## 2023-07-11 ENCOUNTER — Ambulatory Visit: Payer: Self-pay | Admitting: Urgent Care

## 2023-07-11 ENCOUNTER — Encounter: Payer: Self-pay | Admitting: Neurosurgery

## 2023-07-11 ENCOUNTER — Other Ambulatory Visit: Payer: Self-pay

## 2023-07-11 DIAGNOSIS — Z0189 Encounter for other specified special examinations: Secondary | ICD-10-CM | POA: Diagnosis not present

## 2023-07-11 DIAGNOSIS — F17210 Nicotine dependence, cigarettes, uncomplicated: Secondary | ICD-10-CM | POA: Diagnosis not present

## 2023-07-11 DIAGNOSIS — M5412 Radiculopathy, cervical region: Secondary | ICD-10-CM | POA: Diagnosis not present

## 2023-07-11 DIAGNOSIS — Z01812 Encounter for preprocedural laboratory examination: Secondary | ICD-10-CM

## 2023-07-11 DIAGNOSIS — R202 Paresthesia of skin: Secondary | ICD-10-CM

## 2023-07-11 DIAGNOSIS — M50021 Cervical disc disorder at C4-C5 level with myelopathy: Secondary | ICD-10-CM | POA: Insufficient documentation

## 2023-07-11 DIAGNOSIS — R292 Abnormal reflex: Secondary | ICD-10-CM | POA: Diagnosis not present

## 2023-07-11 DIAGNOSIS — E669 Obesity, unspecified: Secondary | ICD-10-CM | POA: Insufficient documentation

## 2023-07-11 DIAGNOSIS — R2681 Unsteadiness on feet: Secondary | ICD-10-CM | POA: Diagnosis not present

## 2023-07-11 DIAGNOSIS — E119 Type 2 diabetes mellitus without complications: Secondary | ICD-10-CM | POA: Insufficient documentation

## 2023-07-11 DIAGNOSIS — K219 Gastro-esophageal reflux disease without esophagitis: Secondary | ICD-10-CM | POA: Diagnosis not present

## 2023-07-11 DIAGNOSIS — M2578 Osteophyte, vertebrae: Secondary | ICD-10-CM | POA: Diagnosis not present

## 2023-07-11 DIAGNOSIS — R200 Anesthesia of skin: Secondary | ICD-10-CM | POA: Diagnosis not present

## 2023-07-11 DIAGNOSIS — Z7985 Long-term (current) use of injectable non-insulin antidiabetic drugs: Secondary | ICD-10-CM | POA: Diagnosis not present

## 2023-07-11 DIAGNOSIS — Z227 Latent tuberculosis: Secondary | ICD-10-CM | POA: Diagnosis not present

## 2023-07-11 DIAGNOSIS — Z01818 Encounter for other preprocedural examination: Secondary | ICD-10-CM

## 2023-07-11 DIAGNOSIS — M4802 Spinal stenosis, cervical region: Secondary | ICD-10-CM | POA: Diagnosis not present

## 2023-07-11 DIAGNOSIS — I10 Essential (primary) hypertension: Secondary | ICD-10-CM | POA: Insufficient documentation

## 2023-07-11 DIAGNOSIS — M4712 Other spondylosis with myelopathy, cervical region: Secondary | ICD-10-CM | POA: Diagnosis present

## 2023-07-11 HISTORY — PX: CERVICAL DISC ARTHROPLASTY: SHX587

## 2023-07-11 LAB — GLUCOSE, CAPILLARY
Glucose-Capillary: 119 mg/dL — ABNORMAL HIGH (ref 70–99)
Glucose-Capillary: 120 mg/dL — ABNORMAL HIGH (ref 70–99)

## 2023-07-11 LAB — ABO/RH: ABO/RH(D): O POS

## 2023-07-11 SURGERY — CERVICAL ANTERIOR DISC ARTHROPLASTY
Anesthesia: General

## 2023-07-11 MED ORDER — PROPOFOL 10 MG/ML IV BOLUS
INTRAVENOUS | Status: DC | PRN
  Administered 2023-07-11: 200 mg via INTRAVENOUS

## 2023-07-11 MED ORDER — MIDAZOLAM HCL 2 MG/2ML IJ SOLN
INTRAMUSCULAR | Status: DC | PRN
  Administered 2023-07-11: 2 mg via INTRAVENOUS

## 2023-07-11 MED ORDER — ACETAMINOPHEN 10 MG/ML IV SOLN: 1000.0000 mg | Freq: Once | INTRAVENOUS | Status: DC | PRN

## 2023-07-11 MED ORDER — PROPOFOL 1000 MG/100ML IV EMUL
INTRAVENOUS | Status: AC
  Filled 2023-07-11: qty 100

## 2023-07-11 MED ORDER — FENTANYL CITRATE (PF) 100 MCG/2ML IJ SOLN
25.0000 ug | INTRAMUSCULAR | Status: DC | PRN
  Administered 2023-07-11 (×2): 25 ug via INTRAVENOUS
  Administered 2023-07-11: 50 ug via INTRAVENOUS

## 2023-07-11 MED ORDER — LACTATED RINGERS IV SOLN: INTRAVENOUS | Status: DC

## 2023-07-11 MED ORDER — REMIFENTANIL HCL 1 MG IV SOLR
INTRAVENOUS | Status: AC
  Filled 2023-07-11: qty 1000

## 2023-07-11 MED ORDER — ALBUTEROL SULFATE HFA 108 (90 BASE) MCG/ACT IN AERS
INHALATION_SPRAY | RESPIRATORY_TRACT | Status: DC | PRN
  Administered 2023-07-11: 4 via RESPIRATORY_TRACT

## 2023-07-11 MED ORDER — EPHEDRINE SULFATE-NACL 50-0.9 MG/10ML-% IV SOSY
PREFILLED_SYRINGE | INTRAVENOUS | Status: DC | PRN
  Administered 2023-07-11: 10 mg via INTRAVENOUS

## 2023-07-11 MED ORDER — BUPIVACAINE-EPINEPHRINE (PF) 0.5% -1:200000 IJ SOLN
INTRAMUSCULAR | Status: AC
  Filled 2023-07-11: qty 20

## 2023-07-11 MED ORDER — FENTANYL CITRATE (PF) 100 MCG/2ML IJ SOLN
INTRAMUSCULAR | Status: AC
  Filled 2023-07-11: qty 2

## 2023-07-11 MED ORDER — BUPIVACAINE-EPINEPHRINE (PF) 0.5% -1:200000 IJ SOLN
INTRAMUSCULAR | Status: DC | PRN
  Administered 2023-07-11: 9 mL via PERINEURAL

## 2023-07-11 MED ORDER — FENTANYL CITRATE (PF) 100 MCG/2ML IJ SOLN
INTRAMUSCULAR | Status: DC | PRN
  Administered 2023-07-11 (×2): 50 ug via INTRAVENOUS

## 2023-07-11 MED ORDER — HYDROMORPHONE HCL 1 MG/ML IJ SOLN
INTRAMUSCULAR | Status: DC | PRN
  Administered 2023-07-11 (×2): 1 mg via INTRAVENOUS

## 2023-07-11 MED ORDER — DEXMEDETOMIDINE HCL IN NACL 200 MCG/50ML IV SOLN
INTRAVENOUS | Status: DC | PRN
  Administered 2023-07-11: 8 ug via INTRAVENOUS

## 2023-07-11 MED ORDER — GLYCOPYRROLATE 0.2 MG/ML IJ SOLN
INTRAMUSCULAR | Status: DC | PRN
Start: 2023-07-11 — End: 2023-07-11
  Administered 2023-07-11: .2 mg via INTRAVENOUS

## 2023-07-11 MED ORDER — SURGIFLO WITH THROMBIN (HEMOSTATIC MATRIX KIT) OPTIME
TOPICAL | Status: DC | PRN
  Administered 2023-07-11: 1 via TOPICAL

## 2023-07-11 MED ORDER — HYDROMORPHONE HCL 1 MG/ML IJ SOLN
INTRAMUSCULAR | Status: AC
  Filled 2023-07-11: qty 1

## 2023-07-11 MED ORDER — OXYCODONE HCL 5 MG PO TABS
ORAL_TABLET | ORAL | Status: AC
  Filled 2023-07-11: qty 1

## 2023-07-11 MED ORDER — OXYCODONE HCL 5 MG PO TABS
5.0000 mg | ORAL_TABLET | Freq: Once | ORAL | Status: AC | PRN
  Administered 2023-07-11: 5 mg via ORAL

## 2023-07-11 MED ORDER — PHENYLEPHRINE HCL-NACL 20-0.9 MG/250ML-% IV SOLN
INTRAVENOUS | Status: DC | PRN
  Administered 2023-07-11: 25 ug/min via INTRAVENOUS

## 2023-07-11 MED ORDER — CEFAZOLIN SODIUM-DEXTROSE 2-4 GM/100ML-% IV SOLN
2.0000 g | INTRAVENOUS | Status: AC
  Administered 2023-07-11: 2 g via INTRAVENOUS

## 2023-07-11 MED ORDER — PHENYLEPHRINE HCL-NACL 20-0.9 MG/250ML-% IV SOLN
INTRAVENOUS | Status: AC
  Filled 2023-07-11: qty 250

## 2023-07-11 MED ORDER — 0.9 % SODIUM CHLORIDE (POUR BTL) OPTIME
TOPICAL | Status: DC | PRN
  Administered 2023-07-11: 500 mL

## 2023-07-11 MED ORDER — OXYCODONE HCL 5 MG/5ML PO SOLN: 5.0000 mg | Freq: Once | ORAL | Status: AC | PRN

## 2023-07-11 MED ORDER — ACETAMINOPHEN 10 MG/ML IV SOLN
INTRAVENOUS | Status: DC | PRN
  Administered 2023-07-11: 1000 mg via INTRAVENOUS

## 2023-07-11 MED ORDER — ONDANSETRON HCL 4 MG/2ML IJ SOLN
INTRAMUSCULAR | Status: DC | PRN
  Administered 2023-07-11 (×2): 4 mg via INTRAVENOUS

## 2023-07-11 MED ORDER — ACETAMINOPHEN 10 MG/ML IV SOLN
INTRAVENOUS | Status: AC
  Filled 2023-07-11: qty 100

## 2023-07-11 MED ORDER — LIDOCAINE HCL (CARDIAC) PF 100 MG/5ML IV SOSY
PREFILLED_SYRINGE | INTRAVENOUS | Status: DC | PRN
  Administered 2023-07-11: 100 mg via INTRAVENOUS

## 2023-07-11 MED ORDER — METHOCARBAMOL 750 MG PO TABS: 750.0000 mg | ORAL_TABLET | Freq: Three times a day (TID) | ORAL | 2 refills | Status: DC | PRN

## 2023-07-11 MED ORDER — SUCCINYLCHOLINE CHLORIDE 200 MG/10ML IV SOSY
PREFILLED_SYRINGE | INTRAVENOUS | Status: DC | PRN
  Administered 2023-07-11: 120 mg via INTRAVENOUS

## 2023-07-11 MED ORDER — OXYCODONE HCL 5 MG PO TABS: 5.0000 mg | ORAL_TABLET | ORAL | 0 refills | Status: DC | PRN

## 2023-07-11 MED ORDER — MIDAZOLAM HCL 2 MG/2ML IJ SOLN
INTRAMUSCULAR | Status: AC
  Filled 2023-07-11: qty 2

## 2023-07-11 MED ORDER — PROPOFOL 500 MG/50ML IV EMUL
INTRAVENOUS | Status: DC | PRN
  Administered 2023-07-11: 155 ug/kg/min via INTRAVENOUS

## 2023-07-11 MED ORDER — REMIFENTANIL HCL 1 MG IV SOLR
INTRAVENOUS | Status: DC | PRN
  Administered 2023-07-11: .2 ug/kg/min via INTRAVENOUS

## 2023-07-11 MED ORDER — ONDANSETRON HCL 4 MG/2ML IJ SOLN: 4.0000 mg | Freq: Once | INTRAMUSCULAR | Status: DC | PRN

## 2023-07-11 MED ORDER — ACETAMINOPHEN 500 MG PO TABS: 1000.0000 mg | ORAL_TABLET | Freq: Four times a day (QID) | ORAL | 0 refills | Status: DC | PRN

## 2023-07-11 SURGICAL SUPPLY — 33 items
BUR NEURO DRILL SOFT 3.0X3.8M (BURR) ×1 IMPLANT
DERMABOND ADVANCED .7 DNX12 (GAUZE/BANDAGES/DRESSINGS) ×1 IMPLANT
DISC MOBI-C CERVICAL 15X7X15 (Neuro Prosthesis/Implant) IMPLANT
DRAPE LAPAROTOMY 77X122 PED (DRAPES) ×1 IMPLANT
DRAPE MICROSCOPE SPINE 48X150 (DRAPES) ×1 IMPLANT
DRAPE SURG 17X11 SM STRL (DRAPES) ×1 IMPLANT
ELECT REM PT RETURN 9FT ADLT (ELECTROSURGICAL) ×1 IMPLANT
ELECTRODE REM PT RTRN 9FT ADLT (ELECTROSURGICAL) ×1 IMPLANT
FEE INTRAOP CADWELL SUPPLY NCS (MISCELLANEOUS) IMPLANT
FEE INTRAOP MONITOR IMPULS NCS (MISCELLANEOUS) IMPLANT
GLOVE BIOGEL PI IND STRL 7.0 (GLOVE) ×1 IMPLANT
GLOVE BIOGEL PI IND STRL 8 (GLOVE) ×4 IMPLANT
GLOVE SURG SYN 7.0 (GLOVE) ×1 IMPLANT
GLOVE SURG SYN 7.0 PF PI (GLOVE) ×1 IMPLANT
GLOVE SURG SYN 7.5 E (GLOVE) ×1 IMPLANT
GLOVE SURG SYN 7.5 PF PI (GLOVE) ×4 IMPLANT
GOWN SRG LRG LVL 4 IMPRV REINF (GOWNS) ×1 IMPLANT
GOWN SRG XL LVL 3 NONREINFORCE (GOWNS) ×1 IMPLANT
INTRAOP CADWELL SUPPLY FEE NCS (MISCELLANEOUS) ×1 IMPLANT
INTRAOP MONITOR FEE IMPULS NCS (MISCELLANEOUS) ×1 IMPLANT
KIT TURNOVER KIT A (KITS) ×1 IMPLANT
MANIFOLD NEPTUNE II (INSTRUMENTS) ×1 IMPLANT
NS IRRIG 1000ML POUR BTL (IV SOLUTION) ×1 IMPLANT
PACK LAMINECTOMY ARMC (PACKS) ×1 IMPLANT
PAD ARMBOARD 7.5X6 YLW CONV (MISCELLANEOUS) ×1 IMPLANT
PIN CASPAR SPINAL 14MM CERVICA (PIN) IMPLANT
SPONGE KITTNER 5P (MISCELLANEOUS) ×1 IMPLANT
SURGIFLO W/THROMBIN 8M KIT (HEMOSTASIS) ×1 IMPLANT
SUT STRATA 3-0 15 PS-2 (SUTURE) IMPLANT
SUT VICRYL 3-0 CR8 SH (SUTURE) ×1 IMPLANT
SYR 30ML LL (SYRINGE) ×1 IMPLANT
TAPE CLOTH 3X10 WHT NS LF (GAUZE/BANDAGES/DRESSINGS) ×1 IMPLANT
TRAP FLUID SMOKE EVACUATOR (MISCELLANEOUS) ×1 IMPLANT

## 2023-07-11 NOTE — Anesthesia Postprocedure Evaluation (Signed)
 Anesthesia Post Note  Patient: Louis Carney  Procedure(s) Performed: C4-5 ARTHROPLASTY  Patient location during evaluation: PACU Anesthesia Type: General Level of consciousness: awake and alert, oriented and patient cooperative Pain management: pain level controlled Vital Signs Assessment: post-procedure vital signs reviewed and stable Respiratory status: spontaneous breathing, nonlabored ventilation and respiratory function stable Cardiovascular status: blood pressure returned to baseline and stable Postop Assessment: adequate PO intake Anesthetic complications: no   No notable events documented.   Last Vitals:  Vitals:   07/11/23 1215 07/11/23 1228  BP:  (!) 141/94  Pulse: 76 85  Resp: 17 16  Temp:    SpO2: 93% 93%    Last Pain:  Vitals:   07/11/23 1228  TempSrc:   PainSc: 8                  Reed Breech

## 2023-07-11 NOTE — Anesthesia Procedure Notes (Signed)
 Procedure Name: Intubation Date/Time: 07/11/2023 9:24 AM  Performed by: Mohammed Kindle, CRNAPre-anesthesia Checklist: Patient identified, Emergency Drugs available, Suction available and Patient being monitored Patient Re-evaluated:Patient Re-evaluated prior to induction Oxygen Delivery Method: Circle system utilized Preoxygenation: Pre-oxygenation with 100% oxygen Induction Type: IV induction Ventilation: Two handed mask ventilation required Laryngoscope Size: McGrath and 3 Grade View: Grade I Tube type: Oral Number of attempts: 1 Airway Equipment and Method: Stylet Placement Confirmation: ETT inserted through vocal cords under direct vision, positive ETCO2 and breath sounds checked- equal and bilateral Secured at: 21 cm Tube secured with: Tape Dental Injury: Teeth and Oropharynx as per pre-operative assessment

## 2023-07-11 NOTE — Op Note (Signed)
 Indications: [name] is a 43 y.o. male with history of progressive cervical myeloradiculopathy.  He had hyperreflexia as well as weakness in his upper extremity and difficulty with his ambulation.  He presented with an MRI which was then followed up with a CT myelogram demonstrating severe bilateral foraminal stenosis and impingement of the spinal cord at C4-5 level.  For this reason we took him to the OR for a C4-5 cervical disc arthroplasty.  Findings: Central disc herniation, herniating through the posterior longitudinal ligament causing compression of the ventral spinal cord.  Significant bilateral lateral recess right worse than left stenosis.  Preoperative Diagnosis:  M47.12 Cervical spondylosis with myelopathy  R20.0 Numbness and tingling R26.81 Gait instability R29.2 Hyperreflexia  Postoperative Diagnosis: same   EBL: 20 ml IVF: See anesthesia report Drains: none Disposition: Extubated and Stable to PACU Complications: none  No foley catheter was placed.   Preoperative Note:   Risks of surgery discussed include: infection, bleeding, stroke, coma, death, paralysis, CSF leak, nerve/spinal cord injury, numbness, tingling, weakness, complex regional pain syndrome, recurrent stenosis and/or disc herniation, vascular injury, development of instability, neck/back pain, C5 palsy, need for further surgery, persistent symptoms, development of deformity, and the risks of anesthesia. The patient understood these risks and agreed to proceed.  Operative Note:  Procedure:  1) Cervical Disc Arthroplasty at C4/5 using a LDR Mobi-C device   Procedure: After obtaining informed consent, the patient taken to the operating room, placed in supine position, general anesthesia induced.  The patient had a small shoulder roll placed behind the neck.  The patient received preop antibiotics and IV Decadron.  The patient had a neck incision outlined, was prepped and draped in usual sterile fashion.  The incision was injected with local anesthetic.   An incision was opened, dissection taken down medial to the carotid artery and jugular vein, lateral to the trachea and esophagus.  The prevertebral fascia identified and a localizing x-ray demonstrated the correct level.  The longus colli were dissected laterally, and self-retaining retractors placed to open the operative field. The microscope was then brought into the field.  With this complete, distractor pins were placed in the vertebral bodies of C4 and C5. The distractor was placed, and the annulus at C4/5 was opened using a bovie.  Curettes and pituitary rongeurs used to remove the majority of disk, then the drill was used to remove the posterior osteophyte and begin the foraminotomies. The nerve hook was used to elevate the posterior longitudinal ligament, which was then removed with Kerrison rongeurs. The microblunt nerve hook could be passed out the foramen bilaterally.   Meticulous hemostasis was obtained.  A trial spacer was used to size the disc space. Using flouroscopic guidance, a 15 mm width x 15 mm depth x 5 mm height Mobi-C was then inserted in the prepared disc space.  The caspar distractor was removed, and bone wax used for hemostasis. Final AP and lateral radiographs were taken.   With the disc arthroplasty in good position, the wound was irrigated copiously and meticulous hemostasis obtained.  Wound was closed in 2 layers using interrupted inverted 3-0 Vicryl sutures.  The wound was dressed with dermabond, the head of bed at 30 degrees, taken to recovery room in stable condition.  No new postop neurological deficits were identified.  Intraoperative neuromonitoring demonstrated improved motor potentials.  Sponge and pattie counts were correct at the end of the procedure.   I performed the entire procedure with the assistance of Joan Flores,  PA-C as an Designer, television/film set. An assistant was required for this procedure due to the  complexity.  The assistant provided assistance in tissue manipulation and suction, and was required for the successful and safe performance of the procedure. I performed the critical portions of the procedure.   Lovenia Kim, MD

## 2023-07-11 NOTE — Interval H&P Note (Signed)
 History and Physical Interval Note:  07/11/2023 8:04 AM  Louis Carney  has presented today for surgery, with the diagnosis of M47.12 Cervical spondylosis with myelopathy  R20.0 Numbness and tingling R26.81 Gait instability R29.2 Hyperreflexia.  The various methods of treatment have been discussed with the patient and family. After consideration of risks, benefits and other options for treatment, the patient has consented to  Procedure(s): C4-5 ARTHROPLASTY (N/A) as a surgical intervention.  The patient's history has been reviewed, patient examined, no change in status, stable for surgery.  I have reviewed the patient's chart and labs.  Questions were answered to the patient's satisfaction.    Heart and lungs clear   Lovenia Kim

## 2023-07-11 NOTE — Transfer of Care (Signed)
 Immediate Anesthesia Transfer of Care Note  Patient: Louis Carney  Procedure(s) Performed: C4-5 ARTHROPLASTY  Patient Location: PACU  Anesthesia Type:General  Level of Consciousness: awake, drowsy, and patient cooperative  Airway & Oxygen Therapy: Patient Spontanous Breathing and Patient connected to face mask oxygen  Post-op Assessment: Report given to RN and Post -op Vital signs reviewed and stable  Post vital signs: Reviewed and stable  Last Vitals:  Vitals Value Taken Time  BP    Temp    Pulse 79 07/11/23 1132  Resp 14 07/11/23 1132  SpO2 96 % 07/11/23 1132  Vitals shown include unfiled device data.  Last Pain:  Vitals:   07/11/23 0701  TempSrc: Temporal  PainSc: 8          Complications: No notable events documented.

## 2023-07-11 NOTE — Discharge Instructions (Signed)
 Your surgeon has performed an operation on your cervical spine (neck) to relieve pressure on the spinal cord and/or nerves. This involved making an incision in the front of your neck and removing one or more of the discs that support your spine and replacing it.  The following are instructions to help in your recovery once you have been discharged from the hospital. Even if you feel well, it is important that you follow these activity guidelines. If you do not let your neck heal properly from the surgery, you can increase the chance of return of your symptoms and other complications.    Activity    No bending, lifting, or twisting ("BLT"). Avoid lifting objects heavier than 10 pounds (gallon milk jug).  Where possible, avoid household activities that involve lifting, bending, reaching, pushing, or pulling such as laundry, vacuuming, grocery shopping, and childcare. Try to arrange for help from friends and family for these activities while your back heals.  Increase physical activity slowly as tolerated.  Taking short walks is encouraged, but avoid strenuous exercise. Do not jog, run, bicycle, lift weights, or participate in any other exercises unless specifically allowed by your doctor.  Talk to your doctor before resuming sexual activity.  You should not drive until cleared by your doctor.  Until released by your doctor, you should not return to work or school.  You should rest at home and let your body heal.   You may shower three days after your surgery.  After showering, lightly dab your incision dry. Do not take a tub bath or go swimming until approved by your doctor at your follow-up appointment.  If your doctor ordered a cervical collar (neck brace) for you, you should wear it whenever you are out of bed. You may remove it when lying down or sleeping, but you should wear it at all other times. Not all neck surgeries require a cervical collar.  If you smoke, we strongly recommend that  you quit.  Smoking has been proven to interfere with normal bone healing and will dramatically reduce the success rate of your surgery. Please contact [facility] (800-QUIT-NOW) and use the resources at www.[facility].com for assistance in stopping smoking.  Surgical Incision   If you have a dressing on your incision, you may remove it two days after your surgery. Keep your incision area clean and dry.  If you have staples or stitches on your incision, you should have a follow up scheduled for removal. If you do not have staples or stitches, you will have steri-strips (small pieces of surgical tape) or Dermabond glue. The steri-strips/glue should begin to peel away within about a week (it is fine if the steri-strips fall off before then). If the strips are still in place one week after your surgery, you may gently remove them.  Diet           You may return to your usual diet. However, you may experience discomfort when swallowing in the first month after your surgery. This is normal. You may find that softer foods are more comfortable for you to swallow. Be sure to stay hydrated.  When to Contact us  You may experience pain in your neck and/or pain between your shoulder blades. This is normal and should improve in the next few weeks with the help of pain medication, muscle relaxers, and rest. Some patients report that a warm compress on the back of the neck or between the shoulder blades helps.  However, should you experience any  of the following, contact us immediately: New numbness or weakness Pain that is progressively getting worse, and is not relieved by your pain medication, muscle relaxers, rest, and warm compresses Bleeding, redness, swelling, pain, or drainage from surgical incision Chills or flu-like symptoms Fever greater than 101.0 F (38.3 C) Inability to eat, drink fluids, or take medications Problems with bowel or bladder functions Difficulty breathing or shortness of  breath Warmth, tenderness, or swelling in your calf Contact Information How to contact us:  If you have any questions/concerns before or after surgery, you can reach [place] at [phone], or you can send a mychart message. We can be reached by phone or mychart 8am-4pm, Monday-Friday.  *Please note: Calls after 4pm are forwarded to a third party answering service. Mychart messages are not routinely monitored during evenings, weekends, and holidays. Please call our office to contact the answering service for urgent concerns during non-business hours.

## 2023-07-11 NOTE — Anesthesia Preprocedure Evaluation (Addendum)
 Anesthesia Evaluation  Patient identified by MRN, date of birth, ID band Patient awake    Reviewed: Allergy & Precautions, NPO status , Patient's Chart, lab work & pertinent test results  History of Anesthesia Complications Negative for: history of anesthetic complications  Airway Mallampati: III   Neck ROM: Full    Dental  (+) Missing   Pulmonary Current Smoker (1 ppd) and Patient abstained from smoking. Latent TB   Pulmonary exam normal breath sounds clear to auscultation       Cardiovascular hypertension, Normal cardiovascular exam Rhythm:Regular Rate:Normal  ECG 07/03/23: normal   Neuro/Psych  Headaches Alcohol use disorder, 6 drinks per day    GI/Hepatic ,GERD  ,,  Endo/Other  diabetes, Type 2  Obesity   Renal/GU negative Renal ROS     Musculoskeletal   Abdominal   Peds  Hematology negative hematology ROS (+)   Anesthesia Other Findings Last dose of Ozempic 06/30/23.  Reproductive/Obstetrics                             Anesthesia Physical Anesthesia Plan  ASA: 3  Anesthesia Plan: General   Post-op Pain Management:    Induction: Intravenous  PONV Risk Score and Plan: 1 and Ondansetron, Dexamethasone and Treatment may vary due to age or medical condition  Airway Management Planned: Oral ETT  Additional Equipment:   Intra-op Plan:   Post-operative Plan: Extubation in OR  Informed Consent: I have reviewed the patients History and Physical, chart, labs and discussed the procedure including the risks, benefits and alternatives for the proposed anesthesia with the patient or authorized representative who has indicated his/her understanding and acceptance.     Dental advisory given  Plan Discussed with: CRNA  Anesthesia Plan Comments: (Patient consented for risks of anesthesia including but not limited to:  - adverse reactions to medications - damage to eyes, teeth, lips or  other oral mucosa - nerve damage due to positioning  - sore throat or hoarseness - damage to heart, brain, nerves, lungs, other parts of body or loss of life  Informed patient about role of CRNA in peri- and intra-operative care.  Patient voiced understanding.)        Anesthesia Quick Evaluation

## 2023-07-12 ENCOUNTER — Encounter: Payer: Self-pay | Admitting: Neurosurgery

## 2023-07-24 ENCOUNTER — Ambulatory Visit (INDEPENDENT_AMBULATORY_CARE_PROVIDER_SITE_OTHER): Payer: Medicare Other | Admitting: Physician Assistant

## 2023-07-24 ENCOUNTER — Encounter: Payer: Self-pay | Admitting: Physician Assistant

## 2023-07-24 VITALS — BP 130/88 | Temp 98.2°F

## 2023-07-24 DIAGNOSIS — R2681 Unsteadiness on feet: Secondary | ICD-10-CM

## 2023-07-24 DIAGNOSIS — Z09 Encounter for follow-up examination after completed treatment for conditions other than malignant neoplasm: Secondary | ICD-10-CM

## 2023-07-24 DIAGNOSIS — M4712 Other spondylosis with myelopathy, cervical region: Secondary | ICD-10-CM

## 2023-07-24 MED ORDER — OXYCODONE HCL 5 MG PO TABS: 5.0000 mg | ORAL_TABLET | Freq: Four times a day (QID) | ORAL | 0 refills | Status: DC | PRN

## 2023-07-24 NOTE — Progress Notes (Signed)
   REFERRING PHYSICIAN:  Sandre Kitty, Md [address] [place],  [place] [address]  DOS: C4-5 arthroplasty, 07/11/2023  HISTORY OF PRESENT ILLNESS: Louis Carney is 2 weeks status post C4-5 arthroplasty. Overall, he is doing pretty well.  Continues to have some burning pain between his shoulder blades.  He has not noticed much improvement yet in regards to his gait.  Oxycodone and ibuprofen is what is currently working best for his pain.  No concerns.  No new numbness, weakness, tingling.   PHYSICAL EXAMINATION:  NEUROLOGICAL:  General: In no acute distress.   Awake, alert, oriented to person, place, and time.  Pupils equal round and reactive to light.    Strength: Side Biceps Triceps Deltoid Interossei Grip Wrist Ext. Wrist Flex.  R 4 4+ 4 4 4+ 3 5  L 5 5 5 5 5 5 5      Incision c/d/I He continues to be hyperreflexic. Patient continues to be unable to ambulate in a straight line.  Imaging:  No new imaging completed prior to this appointment  Assessment / Plan: Louis Carney is doing well after C4-5 arthroplasty.  His pain is well-controlled primarily with oxycodone.  His gait is improving, but he still unable to manage straight line.  We discussed activity escalation and I have advised the patient to lift up to 10 pounds until 6 weeks after surgery, then increase up to 25 pounds until 12 weeks after surgery.  After 12 weeks post-op, the patient advised to increase activity as tolerated. he will return to clinic in approximately 1 month for repeat x-rays.    Advised to contact the office if any questions or concerns arise.   Joan Flores PA-C Dept of Neurosurgery

## 2023-07-25 ENCOUNTER — Telehealth: Payer: Self-pay | Admitting: Physician Assistant

## 2023-07-25 NOTE — Telephone Encounter (Signed)
 post op C4-5 arthroplasty on 07/11/23   Patient forgot to let Nehemiah Settle know at his visit yesterday that since his surgery he has been having itching all over his body periodically throughout the day. He states there is no rash and the itching doesn't last long, but happens at the most inopportune times and is aggravating. He would like to know if this is part of the healing process with his surgery or what this could be from. Please advise.

## 2023-07-26 NOTE — Telephone Encounter (Signed)
 [name] called back stating that his itching occurred even after stopping oxycodone for 1 week.  Advised him to try an OTC antihistamine with agreement from [place].   Denies using any new skin products. He is unsure of what else could be causing his symptoms but states it started after his surgery. He says that the itching is sudden onset all over the body. It subsides within a few minutes.   Patient indicates understanding of red flag symptoms in the MyChart message he received.

## 2023-08-01 NOTE — Progress Notes (Deleted)
 Office Visit Note  Patient: Louis Carney             Date of Birth: [birth year]           MRN: [id number]             PCP: Sandre Kitty, MD Referring: Sandre Kitty, MD Visit Date: 08/02/2023 Occupation: @GUAROCC @  Subjective:  No chief complaint on file.   History of Present Illness: Louis Carney is a 43 y.o. male ***     Activities of Daily Living:  Patient reports morning stiffness for *** {minute/hour:19697}.   Patient {ACTIONS;DENIES/REPORTS:21021675::"Denies"} nocturnal pain.  Difficulty dressing/grooming: {ACTIONS;DENIES/REPORTS:21021675::"Denies"} Difficulty climbing stairs: {ACTIONS;DENIES/REPORTS:21021675::"Denies"} Difficulty getting out of chair: {ACTIONS;DENIES/REPORTS:21021675::"Denies"} Difficulty using hands for taps, buttons, cutlery, and/or writing: {ACTIONS;DENIES/REPORTS:21021675::"Denies"}  No Rheumatology ROS completed.   PMFS History:  Patient Active Problem List   Diagnosis Date Noted   Numbness and tingling 06/26/2023   Gait instability 06/26/2023   Hyperreflexia 06/26/2023   Carpal tunnel syndrome of right wrist 02/07/2023   Chalazion of left upper eyelid 01/08/2023   Chronic sinusitis 09/25/2022   Allergic rhinitis due to animal hair and dander 09/25/2022   Diabetes mellitus (HCC) 05/28/2019   Morbid obesity (HCC) 05/28/2019   Alcoholic hepatitis without ascites 05/28/2019   Alcohol abuse with alcohol-induced disorder (HCC) 02/07/2019   Adjustment disorder with mixed anxiety and depressed mood 02/05/2019   Hepatocellular damage 11/07/2018   Alcohol abuse 09/09/2018   Ulnar neuropathy 06/03/2018   HNP (herniated nucleus pulposus), lumbar 03/07/2018   Medication monitoring encounter 11/29/2017   Spinal stenosis of lumbar region 10/01/2017   Cervical spondylosis with myelopathy 10/01/2017   High risk medication use 09/12/2017   Vitamin D deficiency 09/12/2017   Hyperlipidemia associated with type 2 diabetes mellitus (HCC) 09/12/2017    Low level of high density lipoprotein (HDL) 09/12/2017   Hypertriglyceridemia 09/12/2017   Chronic radicular low back pain- R sided 08/26/2017   History of excision of lamina of cervical vertebra for decompression of sp- C4-6inal cord 08/26/2017   Neuropathy due to medical condition (HCC)-C8-T1 nerve distribution bilateral upper extremity, 08/26/2017   RSD (reflex sympathetic dystrophy)- right entire leg painful to non-noxious stimuli 08/26/2017   Cigarette smoker- 3 ppd for 6 yrs, then 1ppd for 9 yrs- 27 pk yr hx 08/26/2017   Drinks beer- 6-8 beers/d on ave ( 10 Yrs or so) 08/26/2017   Hypertension associated with diabetes (HCC) 08/26/2017   NSAID long-term use-4 ibuprofen 3 times daily times at least 5 years 08/26/2017   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 08/26/2017   Fatigue 08/26/2017   Mood disorder (HCC)- secondary to chronic pain syndrome 08/26/2017   Insomnia 08/26/2017   Environmental allergies 08/26/2017   ED (erectile dysfunction) 08/26/2017   Allergic conjunctivitis of both eyes and rhinitis 08/26/2017   Class 1 obesity 08/26/2017   Plaque psoriasis 08/26/2017    Past Medical History:  Diagnosis Date   Alcohol abuse    Arthritis    Carpal tunnel syndrome of right wrist 12/2017   Cervical disc herniation    C4-5   Cervical myelopathy (HCC)    Chronic sinusitis    GERD (gastroesophageal reflux disease)    Headache    migraines   HNP (herniated nucleus pulposus), lumbar 02/2018   Hyperlipidemia, mixed    Hypertension associated with diabetes (HCC)    Pneumonia    Psoriasis    since age 75 years   TB lung, latent 11/06/2017   Transaminitis 01/29/2018  Tuberculosis    latent TB infection on treatment   Type 2 diabetes mellitus with hyperglycemia (HCC)     Family History  Problem Relation Age of Onset   Alcohol abuse Mother    Hypertension Mother    Lung cancer Mother    Alcohol abuse Father    Hypertension Father    Colon cancer  Father        dx in his late 95's   Esophageal cancer Neg Hx    Rectal cancer Neg Hx    Past Surgical History:  Procedure Laterality Date   CARPAL TUNNEL RELEASE Right 01/15/2018   Procedure: Right Carpal tunnel release;  Surgeon: Coletta Memos, MD;  Location: [facility] OR;  Service: Neurosurgery;  Laterality: Right;  Right Carpal tunnel release   CERVICAL DISC ARTHROPLASTY N/A 07/11/2023   Procedure: C4-5 ARTHROPLASTY;  Surgeon: Lovenia Kim, MD;  Location: [facility] ORS;  Service: Neurosurgery;  Laterality: N/A;   CERVICAL SPINE SURGERY     LUMBAR LAMINECTOMY/DECOMPRESSION MICRODISCECTOMY Right 03/07/2018   Procedure: Right Lumbar 5 Sacral 1 Microdiscectomy;  Surgeon: Coletta Memos, MD;  Location: [facility] OR;  Service: Neurosurgery;  Laterality: Right;  Right Lumbar 5 Sacral 1 Microdiscectomy   Social History   Social History Narrative   Married, 3 biologic children 2 stepchildren    disabled due to back pain   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   Objective: Vital Signs: There were no vitals taken for this visit.   Physical Exam   Musculoskeletal Exam: ***  CDAI Exam: CDAI Score: -- Patient Global: --; Provider Global: -- Swollen: --; Tender: -- Joint Exam 08/02/2023   No joint exam has been documented for this visit   There is currently no information documented on the homunculus. Go to the Rheumatology activity and complete the homunculus joint exam.  Investigation: No additional findings.  Imaging: DG Cervical Spine 2-3 Views Result Date: 07/11/2023 CLINICAL DATA:  [id number] Surgery, elective [id number] EXAM: CERVICAL SPINE - 2-3 VIEW COMPARISON:  None Available. FINDINGS: Two fluoroscopic spot views of the cervical spine submitted in the operating room. Surgical instrument anteriorly at the C4-C5 level. Disc  arthroplasties at C4-C5 and C5-C6. Fluoroscopy time 7 seconds. Dose 1.31 mGy. IMPRESSION: Intraoperative fluoroscopy during cervical spine surgery. Electronically Signed   By: Narda Rutherford M.D.   On: 07/11/2023 11:26   DG C-Arm 1-60 Min-No Report Result Date: 07/11/2023 Fluoroscopy was utilized by the requesting physician.  No radiographic interpretation.    Recent Labs: Lab Results  Component Value Date   WBC 7.9 07/03/2023   HGB 15.5 07/03/2023   PLT 259 07/03/2023   NA 137 07/03/2023   K 3.8 07/03/2023   CL 103 07/03/2023   CO2 25 07/03/2023   GLUCOSE 92 07/03/2023   BUN 14 07/03/2023   CREATININE 0.65 07/03/2023   BILITOT 0.6 12/19/2022   ALKPHOS 63 12/19/2022   AST 18 12/19/2022   ALT 26 12/19/2022   PROT 6.7 12/19/2022   ALBUMIN 4.0 (L) 12/19/2022   CALCIUM 9.3 07/03/2023   GFRAA >60 12/25/2019    Speciality Comments: No specialty comments available.  Procedures:  No procedures performed Allergies: Methocarbamol   Assessment / Plan:     Visit Diagnoses: No diagnosis found.  Orders: No orders of the defined types were placed in this  encounter.  No orders of the defined types were placed in this encounter.   Face-to-face time spent with patient was *** minutes. Greater than 50% of time was spent in counseling and coordination of care.  Follow-Up Instructions: No follow-ups on file.   Fuller Plan, MD  Note - This record has been created using [employer].  Chart creation errors have been sought, but may not always  have been located. Such creation errors do not reflect on  the standard of medical care.

## 2023-08-02 ENCOUNTER — Encounter: Payer: Medicare Other | Admitting: Internal Medicine

## 2023-08-02 ENCOUNTER — Encounter: Payer: Self-pay | Admitting: Neurosurgery

## 2023-08-06 ENCOUNTER — Ambulatory Visit (INDEPENDENT_AMBULATORY_CARE_PROVIDER_SITE_OTHER): Payer: Medicare Other | Admitting: Family Medicine

## 2023-08-06 ENCOUNTER — Encounter: Payer: Self-pay | Admitting: Family Medicine

## 2023-08-06 VITALS — BP 143/89 | HR 80 | Ht 68.0 in | Wt 210.0 lb

## 2023-08-06 DIAGNOSIS — E785 Hyperlipidemia, unspecified: Secondary | ICD-10-CM

## 2023-08-06 DIAGNOSIS — E1165 Type 2 diabetes mellitus with hyperglycemia: Secondary | ICD-10-CM | POA: Diagnosis not present

## 2023-08-06 DIAGNOSIS — I152 Hypertension secondary to endocrine disorders: Secondary | ICD-10-CM | POA: Diagnosis not present

## 2023-08-06 DIAGNOSIS — G959 Disease of spinal cord, unspecified: Secondary | ICD-10-CM | POA: Diagnosis not present

## 2023-08-06 DIAGNOSIS — E1159 Type 2 diabetes mellitus with other circulatory complications: Secondary | ICD-10-CM | POA: Diagnosis not present

## 2023-08-06 DIAGNOSIS — E1169 Type 2 diabetes mellitus with other specified complication: Secondary | ICD-10-CM | POA: Diagnosis not present

## 2023-08-06 DIAGNOSIS — Z794 Long term (current) use of insulin: Secondary | ICD-10-CM | POA: Diagnosis not present

## 2023-08-06 DIAGNOSIS — M5412 Radiculopathy, cervical region: Secondary | ICD-10-CM | POA: Diagnosis not present

## 2023-08-06 NOTE — Progress Notes (Unsigned)
   Established Patient Office Visit  Subjective   Patient ID: Louis Carney, male    DOB: July 26, 1980  Age: 43 y.o. MRN: [id number]  Chief Complaint  Patient presents with   Medical Management of Chronic Issues    HPI  C4-5 arthoplasty.  Patient is a few weeks out from his cervical disc replacement.  Has a funny sensation between his shoulder blades.  Is currently taking ibuprofen about 800 mg in the morning.  Is not taking the Celebrex currently because he is taking the ibuprofen.  Feels like the Celebrex helped his hands significantly overnight.  He was only taking the Celebrex once at night prior to this.  DM2-patient currently only taking Ozempic.  He sees his endocrinologist next month.  We feels like the Ozempic helps with alcohol and food cravings.  He cannot drink more than 1 or 2 beers without "having to throw up".  Hypertension-patient taking olmesartan amlodipine.  No questions or concerns.  HLD-patient taking rosuvastatin.  No issues with this.  Allergies-patient is taking Flonase but this was getting too expensive so he switched to Mucinex nasal spray.  We discussed the difference tween the 2 ingredients.  Recommended he try store brand fluticasone and if he has to stop the Mucinex he should taper off due to the rebound potential.  Also encouraged him to use the Astepro that was recommended.   The 10-year ASCVD risk score (Arnett DK, et al., 2019) is: 20.4%  Health Maintenance Due  Topic Date Due   COVID-19 Vaccine (1) Never done   Pneumococcal Vaccine 55-35 Years old (1 of 2 - PCV) Never done   OPHTHALMOLOGY EXAM  01/29/2020   Medicare Annual Wellness (AWV)  08/23/2023      Objective:     BP (!) 142/80   Pulse 80   Ht 5\' 8"  (1.727 m)   Wt 210 lb (95.3 kg)   SpO2 98%   BMI 31.93 kg/m  {Vitals History (Optional):23777}  Physical Exam General: Alert, oriented Ulnar: No respiratory distress Psych: Pleasant affect.   No results found for any visits on  08/06/23.      Assessment & Plan:   There are no diagnoses linked to this encounter.   No follow-ups on file.    Sandre Kitty, MD

## 2023-08-06 NOTE — Patient Instructions (Signed)
 It was nice to see you today,  We addressed the following topics today: -No changes to your medications today. - If you want to try taking your Celebrex twice a day you can do that but you will need to stop taking the ibuprofen completely.  If you feel like the Celebrex is the more effective of the 2 I can refill it as 200 mg twice a day but you have to make sure you do not take any Advil ibuprofen naproxen or Aleve or any other NSAIDs when we do this.  Have a great day,  Frederic Jericho, MD

## 2023-08-08 ENCOUNTER — Other Ambulatory Visit: Payer: Self-pay | Admitting: Nurse Practitioner

## 2023-08-08 DIAGNOSIS — N529 Male erectile dysfunction, unspecified: Secondary | ICD-10-CM

## 2023-08-09 DIAGNOSIS — M5412 Radiculopathy, cervical region: Secondary | ICD-10-CM | POA: Insufficient documentation

## 2023-08-09 DIAGNOSIS — G959 Disease of spinal cord, unspecified: Secondary | ICD-10-CM | POA: Insufficient documentation

## 2023-08-09 NOTE — Assessment & Plan Note (Signed)
 Slightly high today.  Will hold off on changes and recheck at next visit.  Continue home amlodipine and olmesartan

## 2023-08-09 NOTE — Assessment & Plan Note (Signed)
 Continue rosuvastatin.  Recheck cholesterol at next visit

## 2023-08-09 NOTE — Assessment & Plan Note (Signed)
 Recently had C4-C5 arthroplasty.  Since the surgery patient has noticed a midline upper thoracic pain between the shoulder blades for which he takes ibuprofen 800 to 1000 mg in the morning.  He has stopped taking the Celebrex.  This was helping his pain in the hands which has returned since stopping Celebrex.  Advised him he could try taking 1 tablet of his 200 mg Celebrex in the evening and 1 in the morning and stopping the ibuprofen.  The 400 mg of Celebrex would be preferable to the 1000 mg of ibuprofen in terms of safety and side effects.  Made sure the patient understands he should not take more than 1 NSAID at a time.  Patient is agreeable to trying this.  If it is helpful we can switch to 200 twice daily of Celebrex, with the intent of going back down to 200 nightly once the upper back pain improves.

## 2023-08-09 NOTE — Assessment & Plan Note (Signed)
 Continue management per endocrinology.  On Ozempic only right now.  Would favor him staying on Ozempic due to its other benefits beyond blood sugar control.  It also appears to be helping him keep his alcohol use under control.

## 2023-08-13 ENCOUNTER — Ambulatory Visit: Admitting: Dermatology

## 2023-08-19 ENCOUNTER — Other Ambulatory Visit: Payer: Self-pay | Admitting: Dermatology

## 2023-08-19 DIAGNOSIS — L400 Psoriasis vulgaris: Secondary | ICD-10-CM

## 2023-08-20 ENCOUNTER — Ambulatory Visit
Admission: RE | Admit: 2023-08-20 | Discharge: 2023-08-20 | Disposition: A | Discharge status: Home / Self Care | Source: Ambulatory Visit | Attending: Neurosurgery | Admitting: Neurosurgery

## 2023-08-20 ENCOUNTER — Other Ambulatory Visit: Payer: Self-pay | Admitting: Family Medicine

## 2023-08-20 ENCOUNTER — Ambulatory Visit
Admission: RE | Admit: 2023-08-20 | Discharge: 2023-08-20 | Disposition: A | Discharge status: Home / Self Care | Attending: Neurosurgery | Admitting: Neurosurgery

## 2023-08-20 DIAGNOSIS — M4712 Other spondylosis with myelopathy, cervical region: Secondary | ICD-10-CM

## 2023-08-20 DIAGNOSIS — M50323 Other cervical disc degeneration at C6-C7 level: Secondary | ICD-10-CM | POA: Diagnosis not present

## 2023-08-20 DIAGNOSIS — M47812 Spondylosis without myelopathy or radiculopathy, cervical region: Secondary | ICD-10-CM | POA: Diagnosis not present

## 2023-08-21 ENCOUNTER — Ambulatory Visit (INDEPENDENT_AMBULATORY_CARE_PROVIDER_SITE_OTHER): Payer: Medicare Other | Admitting: Neurosurgery

## 2023-08-21 VITALS — BP 138/84 | Temp 98.2°F | Ht 68.0 in | Wt 210.0 lb

## 2023-08-21 DIAGNOSIS — Z09 Encounter for follow-up examination after completed treatment for conditions other than malignant neoplasm: Secondary | ICD-10-CM

## 2023-08-21 DIAGNOSIS — Z9889 Other specified postprocedural states: Secondary | ICD-10-CM | POA: Insufficient documentation

## 2023-08-21 DIAGNOSIS — M50021 Cervical disc disorder at C4-C5 level with myelopathy: Secondary | ICD-10-CM

## 2023-08-21 NOTE — Progress Notes (Signed)
   REFERRING PHYSICIAN:  Laneta Pintos, Md [address] [place],  [place] [address]  DOS: C4-5 arthroplasty, 07/11/2023  HISTORY OF PRESENT ILLNESS: Louis Carney is 6 weeks status post C4-5 arthroplasty. Overall, he is doing well.  The burning between his shoulder blades has improved.  He has noticed improvement in his gait as well as his upper extremity function.  Overall he feels like he is doing well.  He does have intermittent pain in his neck and shoulders however this is continuing to improve.   PHYSICAL EXAMINATION:  NEUROLOGICAL:  General: In no acute distress.   Awake, alert, oriented to person, place, and time.  Pupils equal round and reactive to light.    Strength: Side Biceps Triceps Deltoid Interossei Grip Wrist Ext. Wrist Flex.  R 5 4+ 4+ 4+ 4+ 4+ 5  L 5 5 5 5 5 5 5      Incision c/d/I He continues to be hyperreflexic. Ambulation today is smoother and less guarded.  No longer wavering significantly.  Imaging:  I reviewed his x-rays today, shows that the C4-5 arthroplasty is in good position.  No evidence of acute failure.  Assessment / Plan: Louis Carney is doing well after C4-5 arthroplasty.  His pain is well-controlled primarily with oxycodone .  Overall he is continue to improve neurologically.  His gait is improved and his motor examination has improved significantly.  His x-rays were done today which show hardware in good positioning.  He has had an improvement in his pain as well as his gait and function.  He does continue to have intermittent neck pain with certain positions and intermittent shooting pain with certain positions however this is much more manageable than it was preoperatively.  At this point he can increase his lifting restriction to 25 pounds over the next 12 weeks.  Will plan to see him again for follow-up.    Advised to contact the office if any questions or concerns arise.   Carroll Clamp, MD Dept of Neurosurgery

## 2023-08-29 ENCOUNTER — Ambulatory Visit: Payer: Medicare Other

## 2023-08-29 DIAGNOSIS — Z0000 Encounter for general adult medical examination without abnormal findings: Secondary | ICD-10-CM | POA: Diagnosis not present

## 2023-08-29 NOTE — Progress Notes (Signed)
 Subjective:   Louis Carney is a 43 y.o. who presents for a Medicare Wellness preventive visit.  Visit Complete: Virtual I connected with  Louis Carney on 08/29/23 by a audio enabled telemedicine application and verified that I am speaking with the correct person using two identifiers.  Patient Location: Home  Provider Location: Home Office  I discussed the limitations of evaluation and management by telemedicine. The patient expressed understanding and agreed to proceed.  Vital Signs: Because this visit was a virtual/telehealth visit, some criteria may be missing or patient reported. Any vitals not documented were not able to be obtained and vitals that have been documented are patient reported.  VideoError- Librarian, academic were attempted between this provider and patient, however failed, due to patient having technical difficulties OR patient did not have access to video capability.  We continued and completed visit with audio only.   Persons Participating in Visit: Patient.  AWV Questionnaire: No: Patient Medicare AWV questionnaire was not completed prior to this visit.  Cardiac Risk Factors include: advanced age (>56men, >4 women);diabetes mellitus;dyslipidemia;hypertension;male gender     Objective:    Today's Vitals   08/29/23 0926  PainSc: 3    There is no height or weight on file to calculate BMI.     08/29/2023    9:35 AM 07/11/2023    7:02 AM 07/03/2023    2:59 PM 12/10/2022    2:07 PM 08/23/2022    9:43 AM 01/24/2021   10:03 PM 12/25/2019   10:00 AM  Advanced Directives  Does Patient Have a Medical Advance Directive? No No No No No Yes No  Type of Careers adviser;Living will   Does patient want to make changes to medical advance directive?      No - Patient declined   Copy of Healthcare Power of Attorney in Chart?      No - copy requested   Would patient like information on creating a medical  advance directive? No - Patient declined No - Patient declined   No - Patient declined      Current Medications (verified) Outpatient Encounter Medications as of 08/29/2023  Medication Sig   amLODipine  (NORVASC ) 10 MG tablet Take 10 mg by mouth at bedtime.   Blood Glucose Monitoring Suppl (ONETOUCH VERIO) w/Device KIT Inject 1 Lancet into the skin as directed.   celecoxib  (CELEBREX ) 200 MG capsule Take 200 mg by mouth at bedtime.   esomeprazole (NEXIUM) 20 MG capsule Take 20 mg by mouth at bedtime.   ibuprofen (ADVIL) 200 MG tablet Take 800 mg by mouth daily.   Naphazoline HCl (CLEAR EYES OP) Place 1 drop into both eyes 2 (two) times daily.   Naphazoline-Pheniramine (OPCON-A OP) Place 1 drop into both eyes daily as needed (allergies).   olmesartan  (BENICAR ) 40 MG tablet Take 40 mg by mouth at bedtime.   OneTouch Delica Lancets 33G MISC USE TO TEST BLOOD SUGARS 6 TIMES DAILY (BEFORE MEALS AND 2 HOURS AFTER MEALS)   ONETOUCH VERIO test strip USE TO TEST 6 TIMES DAILY (BEFORE MEALS AND 2 HOURS AFTER MEALS)   Phenylephrine  HCl (SINEX REGULAR NA) Place 1 spray into the nose daily as needed (congestion).   rosuvastatin  (CRESTOR ) 20 MG tablet Take 1 tablet (20 mg total) by mouth daily. (Patient taking differently: Take 20 mg by mouth at bedtime.)   Semaglutide , 1 MG/DOSE, (OZEMPIC , 1 MG/DOSE,) 4 MG/3ML SOPN Inject 1 mg into the skin  once a week.   sildenafil  (VIAGRA ) 100 MG tablet TAKE 1/2 (ONE-HALF) TABLET BY MOUTH 30 MINUTES PRIOR TO INTERCOURSE AS NEEDED   SKYRIZI  PEN 150 MG/ML pen INJECT 1 PEN SUBCUTANEOUSLY EVERY 12 WEEKS AS DIRECTED.   acetaminophen  (TYLENOL ) 500 MG tablet Take 2 tablets (1,000 mg total) by mouth every 6 (six) hours as needed. (Patient not taking: Reported on 08/29/2023)   clobetasol  cream (TEMOVATE ) 0.05 % Apply to aa psoriasis QD 3d/wk. Avoid applying to face, groin, and axilla. Use as directed. Long-term use can cause thinning of the skin. (Patient not taking: Reported on  08/29/2023)   oxyCODONE  (ROXICODONE ) 5 MG immediate release tablet Take 1 tablet (5 mg total) by mouth every 6 (six) hours as needed for severe pain (pain score 7-10). (Patient not taking: Reported on 08/29/2023)   No facility-administered encounter medications on file as of 08/29/2023.    Allergies (verified) Methocarbamol    History: Past Medical History:  Diagnosis Date   Alcohol abuse    Arthritis    Carpal tunnel syndrome of right wrist 12/2017   Cervical disc herniation    C4-5   Cervical myelopathy (HCC)    Chronic sinusitis    GERD (gastroesophageal reflux disease)    Headache    migraines   HNP (herniated nucleus pulposus), lumbar 02/2018   Hyperlipidemia, mixed    Hypertension associated with diabetes (HCC)    Pneumonia    Psoriasis    since age 21 years   TB lung, latent 11/06/2017   Transaminitis 01/29/2018   Tuberculosis    latent TB infection on treatment   Type 2 diabetes mellitus with hyperglycemia (HCC)    Past Surgical History:  Procedure Laterality Date   CARPAL TUNNEL RELEASE Right 01/15/2018   Procedure: Right Carpal tunnel release;  Surgeon: Audie Bleacher, MD;  Location: [facility] OR;  Service: Neurosurgery;  Laterality: Right;  Right Carpal tunnel release   CERVICAL DISC ARTHROPLASTY N/A 07/11/2023   Procedure: C4-5 ARTHROPLASTY;  Surgeon: Carroll Clamp, MD;  Location: [facility] ORS;  Service: Neurosurgery;  Laterality: N/A;   CERVICAL SPINE SURGERY     LUMBAR LAMINECTOMY/DECOMPRESSION MICRODISCECTOMY Right 03/07/2018   Procedure: Right Lumbar 5 Sacral 1 Microdiscectomy;  Surgeon: Audie Bleacher, MD;  Location: [facility] OR;  Service: Neurosurgery;  Laterality: Right;  Right Lumbar 5 Sacral 1 Microdiscectomy   Family History  Problem Relation Age of Onset   Alcohol abuse Mother    Hypertension Mother    Lung cancer Mother    Alcohol abuse Father    Hypertension Father    Colon cancer Father        dx in his late 86's   Esophageal cancer Neg Hx    Rectal cancer Neg  Hx    Social History   Socioeconomic History   Marital status: Married    Spouse name: Brittney   Number of children: 3   Years of education: Not on file   Highest education level: 11th grade  Occupational History   Occupation: disabled  Tobacco Use   Smoking status: Every Day    Current packs/day: 1.00    Average packs/day: 1 pack/day for 15.0 years (15.0 ttl pk-yrs)    Types: Cigarettes   Smokeless tobacco: Never  Vaping Use   Vaping status: Never Used  Substance and Sexual Activity   Alcohol use: Yes    Alcohol/week: 24.0 standard drinks of alcohol    Types: 24 Standard drinks or equivalent per week    Comment: 6-8 beer per  day   Drug use: Never   Sexual activity: Yes    Partners: Female    Birth control/protection: None  Other Topics Concern   Not on file  Social History Narrative   Married, 3 biologic children 2 stepchildren    disabled due to back pain   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   6-8 beers nightly   Smoker   No drug use   Social Drivers of Corporate investment banker Strain: Low Risk  (08/29/2023)   Overall Financial Resource Strain (CARDIA)    Difficulty of Paying Living Expenses: Not hard at all  Food Insecurity: No Food Insecurity (08/29/2023)   Hunger Vital Sign    Worried About Running Out of Food in the Last Year: Never true    Ran Out of Food in the Last Year: Never true  Transportation Needs: No Transportation Needs (08/29/2023)   PRAPARE - Administrator, Civil Service (Medical): No    Lack of Transportation (Non-Medical): No  Physical Activity: Insufficiently Active (08/29/2023)   Exercise Vital Sign    Days of Exercise per Week: 7 days    Minutes of Exercise per Session: 20 min  Stress: No Stress Concern Present (08/29/2023)   [employer] of Occupational Health - Occupational Stress Questionnaire    Feeling of Stress : Not at all  Social Connections: Socially  Isolated (08/29/2023)   Social Connection and Isolation Panel [NHANES]    Frequency of Communication with Friends and Family: More than three times a week    Frequency of Social Gatherings with Friends and Family: Once a week    Attends Religious Services: Never    Database administrator or Organizations: No    Attends Engineer, structural: Never    Marital Status: Divorced    Tobacco Counseling Ready to quit: Not Answered Counseling given: Not Answered    Clinical Intake:  Pre-visit preparation completed: Yes  Pain : 0-10 Pain Score: 3  Pain Type: Chronic pain Pain Location: Back Pain Orientation: Lower Pain Descriptors / Indicators: Aching Pain Onset: More than a month ago Pain Frequency: Constant     Nutritional Risks: None Diabetes: Yes CBG done?: No Did pt. bring in CBG monitor from home?: No  Lab Results  Component Value Date   HGBA1C 5.7 (A) 03/11/2023   HGBA1C 5.6 12/19/2022   HGBA1C 5.3 09/06/2022     How often do you need to have someone help you when you read instructions, pamphlets, or other written materials from your doctor or pharmacy?: 1 - Never  Interpreter Needed?: No  Information entered by :: NAllen LPN   Activities of Daily Living     08/29/2023    9:27 AM 07/03/2023    3:01 PM  In your present state of health, do you have any difficulty performing the following activities:  Hearing? 0   Vision? 0   Difficulty concentrating or making decisions? 0   Walking or climbing stairs? 1   Dressing or bathing? 0   Doing errands, shopping? 0 0  Preparing Food and eating ? N   Using the Toilet? N   In the past six months, have you accidently leaked urine? N   Do you have problems with loss of bowel control? N   Managing your Medications? N   Managing your Finances? N   Housekeeping or managing your Housekeeping? N     Patient Care Team: Ambrosio Junker  K, MD as PCP - General (Family Medicine) Clark-Burning, Bridgette Campus, PA-C (Inactive)  (Dermatology) Audie Bleacher, MD as Consulting Physician (Neurosurgery) Comer, Judithann Novas, MD as Consulting Physician (Infectious Diseases) Devon Fogo, MD (Inactive) as Consulting Physician (Dermatology) Emilie Harden, MD as Consulting Physician (Internal Medicine)  Indicate any recent Medical Services you may have received from other than [facility] providers in the past year (date may be approximate).     Assessment:   This is a routine wellness examination for Elder.  Hearing/Vision screen Hearing Screening - Comments:: Denies hearing issues Vision Screening - Comments:: Regular eye exams, [facility]   Goals Addressed             This Visit's Progress    Patient Stated       08/29/2023, quit smoking, quit drinking and get back better       Depression Screen     08/29/2023    9:38 AM 08/06/2023    1:10 PM 05/08/2023   10:55 AM 02/05/2023    1:10 PM 01/08/2023    1:15 PM 09/25/2022    1:18 PM 08/23/2022    9:41 AM  PHQ 2/9 Scores  PHQ - 2 Score 0 2 1 1 3 3  0  PHQ- 9 Score 6 5 5 7 10 7      Fall Risk     08/29/2023    9:37 AM 08/23/2022    9:42 AM 01/16/2022    9:46 AM 09/06/2021   10:18 AM 03/09/2021    2:53 PM  Fall Risk   Falls in the past year? 1 1 0 1 1  Comment unsteady      Number falls in past yr: 1 0 0 1 1  Injury with Fall? 0 0 0 0 0  Risk for fall due to : History of fall(s);Impaired balance/gait;Medication side effect No Fall Risks No Fall Risks Impaired balance/gait;Impaired mobility History of fall(s)  Follow up Falls prevention discussed;Falls evaluation completed Falls prevention discussed Falls evaluation completed;Education provided Falls evaluation completed Falls evaluation completed    MEDICARE RISK AT HOME:  Medicare Risk at Home Any stairs in or around the home?: Yes If so, are there any without handrails?: No Home free of loose throw rugs in walkways, pet beds, electrical cords, etc?: Yes Adequate lighting in your home to reduce risk of  falls?: Yes Life alert?: No Use of a cane, walker or w/c?: No Grab bars in the bathroom?: Yes Shower chair or bench in shower?: No Elevated toilet seat or a handicapped toilet?: No  TIMED UP AND GO:  Was the test performed?  No  Cognitive Function: 6CIT completed        08/29/2023    9:39 AM 08/23/2022    9:43 AM 01/24/2021   10:07 PM  6CIT Screen  What Year? 0 points 0 points 0 points  What month? 0 points 0 points 0 points  What time? 0 points 0 points 0 points  Count back from 20 0 points 0 points 0 points  Months in reverse 0 points 0 points 0 points  Repeat phrase 0 points 0 points 0 points  Total Score 0 points 0 points 0 points    Immunizations  There is no immunization history on file for this patient.  Screening Tests Health Maintenance  Topic Date Due   COVID-19 Vaccine (1) Never done   Pneumococcal Vaccine 25-71 Years old (1 of 2 - PCV) Never done   OPHTHALMOLOGY EXAM  01/29/2020   Diabetic kidney  evaluation - Urine ACR  09/06/2023   DTaP/Tdap/Td (1 - Tdap) 05/07/2024 (Originally 09/03/1998)   FOOT EXAM  09/06/2023   HEMOGLOBIN A1C  09/08/2023   INFLUENZA VACCINE  11/29/2023   Diabetic kidney evaluation - eGFR measurement  07/02/2024   Medicare Annual Wellness (AWV)  08/28/2024   Hepatitis C Screening  Completed   HIV Screening  Completed   HPV VACCINES  Aged Out   Meningococcal B Vaccine  Aged Out    Health Maintenance  Health Maintenance Due  Topic Date Due   COVID-19 Vaccine (1) Never done   Pneumococcal Vaccine 79-66 Years old (1 of 2 - PCV) Never done   OPHTHALMOLOGY EXAM  01/29/2020   Diabetic kidney evaluation - Urine ACR  09/06/2023   Health Maintenance Items Addressed: Declines vaccines  Additional Screening:  Vision Screening: Recommended annual ophthalmology exams for early detection of glaucoma and other disorders of the eye.  Dental Screening: Recommended annual dental exams for proper oral hygiene  Community Resource Referral /  Chronic Care Management: CRR required this visit?  No   CCM required this visit?  No     Plan:     I have personally reviewed and noted the following in the patient's chart:   Medical and social history Use of alcohol, tobacco or illicit drugs  Current medications and supplements including opioid prescriptions. Patient is not currently taking opioid prescriptions. Functional ability and status Nutritional status Physical activity Advanced directives List of other physicians Hospitalizations, surgeries, and ER visits in previous 12 months Vitals Screenings to include cognitive, depression, and falls Referrals and appointments  In addition, I have reviewed and discussed with patient certain preventive protocols, quality metrics, and best practice recommendations. A written personalized care plan for preventive services as well as general preventive health recommendations were provided to patient.     Areatha Beecham, LPN   05/05/1094   After Visit Summary: (MyChart) Due to this being a telephonic visit, the after visit summary with patients personalized plan was offered to patient via MyChart   Notes: Nothing significant to report at this time.

## 2023-08-29 NOTE — Patient Instructions (Signed)
 Louis Carney , Thank you for taking time to come for your Medicare Wellness Visit. I appreciate your ongoing commitment to your health goals. Please review the following plan we discussed and let me know if I can assist you in the future.   Referrals/Orders/Follow-Ups/Clinician Recommendations: none  This is a list of the screening recommended for you and due dates:  Health Maintenance  Topic Date Due   COVID-19 Vaccine (1) Never done   Pneumococcal Vaccination (1 of 2 - PCV) Never done   Eye exam for diabetics  01/29/2020   Yearly kidney health urinalysis for diabetes  09/06/2023   DTaP/Tdap/Td vaccine (1 - Tdap) 05/07/2024*   Complete foot exam   09/06/2023   Hemoglobin A1C  09/08/2023   Flu Shot  11/29/2023   Yearly kidney function blood test for diabetes  07/02/2024   Medicare Annual Wellness Visit  08/28/2024   Hepatitis C Screening  Completed   HIV Screening  Completed   HPV Vaccine  Aged Out   Meningitis B Vaccine  Aged Out  *Topic was postponed. The date shown is not the original due date.    Advanced directives: (ACP Link)Information on Advanced Care Planning can be found at [place]  Secretary of [facility] Advance Health Care Directives Advance Health Care Directives. [email or web address]   Next Medicare Annual Wellness Visit scheduled for next year: Yes  insert Preventive Care attachment Insert FALL PREVENTION attachment if needed

## 2023-09-03 ENCOUNTER — Encounter: Payer: Self-pay | Admitting: Neurosurgery

## 2023-09-04 ENCOUNTER — Other Ambulatory Visit: Payer: Self-pay | Admitting: Family Medicine

## 2023-09-09 ENCOUNTER — Other Ambulatory Visit: Payer: Self-pay | Admitting: Family Medicine

## 2023-09-09 ENCOUNTER — Ambulatory Visit (INDEPENDENT_AMBULATORY_CARE_PROVIDER_SITE_OTHER): Payer: Medicare Other | Admitting: Internal Medicine

## 2023-09-09 ENCOUNTER — Encounter: Payer: Self-pay | Admitting: Dermatology

## 2023-09-09 ENCOUNTER — Ambulatory Visit: Admitting: Dermatology

## 2023-09-09 ENCOUNTER — Encounter: Payer: Self-pay | Admitting: Internal Medicine

## 2023-09-09 VITALS — BP 130/70 | HR 85 | Ht 68.0 in | Wt 210.0 lb

## 2023-09-09 DIAGNOSIS — L820 Inflamed seborrheic keratosis: Secondary | ICD-10-CM

## 2023-09-09 DIAGNOSIS — N529 Male erectile dysfunction, unspecified: Secondary | ICD-10-CM

## 2023-09-09 DIAGNOSIS — L408 Other psoriasis: Secondary | ICD-10-CM | POA: Diagnosis not present

## 2023-09-09 DIAGNOSIS — E1165 Type 2 diabetes mellitus with hyperglycemia: Secondary | ICD-10-CM | POA: Diagnosis not present

## 2023-09-09 DIAGNOSIS — L578 Other skin changes due to chronic exposure to nonionizing radiation: Secondary | ICD-10-CM

## 2023-09-09 DIAGNOSIS — Z794 Long term (current) use of insulin: Secondary | ICD-10-CM | POA: Diagnosis not present

## 2023-09-09 DIAGNOSIS — Z8611 Personal history of tuberculosis: Secondary | ICD-10-CM

## 2023-09-09 DIAGNOSIS — W908XXA Exposure to other nonionizing radiation, initial encounter: Secondary | ICD-10-CM | POA: Diagnosis not present

## 2023-09-09 DIAGNOSIS — Z7189 Other specified counseling: Secondary | ICD-10-CM

## 2023-09-09 DIAGNOSIS — D489 Neoplasm of uncertain behavior, unspecified: Secondary | ICD-10-CM

## 2023-09-09 DIAGNOSIS — L409 Psoriasis, unspecified: Secondary | ICD-10-CM

## 2023-09-09 DIAGNOSIS — L821 Other seborrheic keratosis: Secondary | ICD-10-CM | POA: Diagnosis not present

## 2023-09-09 DIAGNOSIS — Z79899 Other long term (current) drug therapy: Secondary | ICD-10-CM

## 2023-09-09 DIAGNOSIS — D492 Neoplasm of unspecified behavior of bone, soft tissue, and skin: Secondary | ICD-10-CM | POA: Diagnosis not present

## 2023-09-09 DIAGNOSIS — E782 Mixed hyperlipidemia: Secondary | ICD-10-CM

## 2023-09-09 DIAGNOSIS — L4050 Arthropathic psoriasis, unspecified: Secondary | ICD-10-CM | POA: Diagnosis not present

## 2023-09-09 LAB — POCT GLYCOSYLATED HEMOGLOBIN (HGB A1C): Hemoglobin A1C: 5.5 % (ref 4.0–5.6)

## 2023-09-09 MED ORDER — OZEMPIC (1 MG/DOSE) 4 MG/3ML ~~LOC~~ SOPN: 1.0000 mg | PEN_INJECTOR | SUBCUTANEOUS | 3 refills | Status: DC

## 2023-09-09 NOTE — Patient Instructions (Addendum)
 Please continue: - Ozempic  1 mg weekly   Please stop at the lab.  Please return in 6 months.

## 2023-09-09 NOTE — Progress Notes (Unsigned)
 Follow-Up Visit   Subjective  Louis Carney is a 43 y.o. male who presents for the following: Psoriasis 6 month follow up on psoriasis currently on skyrizi , no active flares, history of flares at legs, hands, arms, and right sacral area The patient has spots, moles and lesions to be evaluated, some may be new or changing and the patient may have concern these could be cancer.  The following portions of the chart were reviewed this encounter and updated as appropriate: medications, allergies, medical history  Review of Systems:  No other skin or systemic complaints except as noted in HPI or Assessment and Plan.  Objective  Well appearing patient in no apparent distress; mood and affect are within normal limits.  Areas Examined: Sacral area, arms, legs, hands   Relevant exam findings are noted in the Assessment and Plan.  right sacral area 1.5 cm area at superior plaque   right lower eyelid x 1, right upper eyelid x 2 (3) Erythematous stuck-on, waxy papule or plaque  Assessment & Plan   NEOPLASM OF UNCERTAIN BEHAVIOR right sacral area Epidermal / dermal shaving  Lesion diameter (cm):  1.5 Informed consent: discussed and consent obtained   Timeout: patient name, date of birth, surgical site, and procedure verified   Procedure prep:  Patient was prepped and draped in usual sterile fashion Prep type:  Isopropyl alcohol Anesthesia: the lesion was anesthetized in a standard fashion   Anesthetic:  1% lidocaine  w/ epinephrine  1-100,000 buffered w/ 8.4% NaHCO3 Instrument used: flexible razor blade   Hemostasis achieved with: pressure, aluminum chloride and electrodesiccation   Outcome: patient tolerated procedure well   Post-procedure details: sterile dressing applied and wound care instructions given   Dressing type: bandage and petrolatum   Specimen 1 - Surgical pathology Differential Diagnosis: Large Psoriasis plaque persistent on Skyrizi  2ndary to Keobnerization (pant  rubbing on area vs back pain pin point on sacral area) vs viral wart.   Check Margins: No Large Psoriasis plaque persistent on Skyrizi  2ndary to Keobnerization (pant rubbing on area vs back pain pin point on sacral area) vs viral wart.   Persistent area despite skyrizi    Discussed pending results may consider Ilk injections in future  HISTORY OF TUBERCULOSIS   Related Procedures DG Chest 2 View INFLAMED SEBORRHEIC KERATOSIS (3) right lower eyelid x 1, right upper eyelid x 2 (3) Symptomatic, irritating, patient would like treated. Destruction of lesion - right lower eyelid x 1, right upper eyelid x 2 (3) Complexity: simple   Destruction method: cryotherapy   Informed consent: discussed and consent obtained   Timeout:  patient name, date of birth, surgical site, and procedure verified Lesion destroyed using liquid nitrogen: Yes   Region frozen until ice ball extended beyond lesion: Yes   Outcome: patient tolerated procedure well with no complications   Post-procedure details: wound care instructions given   PSORIASIS   COUNSELING AND COORDINATION OF CARE   MEDICATION MANAGEMENT   LONG-TERM USE OF HIGH-RISK MEDICATION    PSORIASIS on systemic treatment with psoriatic arthritis, reviewed recent labs and chest x-ray  Exam: active areas on elbows and  Small plaques of the hands. Persistent plaque of the sacral area 20% BSA.  Chronic and persistent condition with duration or expected duration over one year. Condition is symptomatic/ bothersome to patient. Not currently at goal.  Counseling and coordination of care for severe psoriasis on systemic treatment  Psoriasis - severe on systemic treatment.  Psoriasis is a chronic non-curable, but treatable genetic/hereditary disease  that may have other systemic features affecting other organ systems such as joints (Psoriatic Arthritis).  It is linked with heart disease, inflammatory bowel disease, non-alcoholic fatty liver disease, and  depression. Significant skin psoriasis and/or psoriatic arthritis may have significant symptoms and affects activities of daily activity and often benefits from systemic treatments.  These systemic treatments have some potential side effects including immunosuppression and require pre-treatment laboratory screening and periodic laboratory monitoring and periodic in person evaluation and monitoring by the attending dermatologist physician (long term medication management).   Patient denies joint pain  Hx of trying embrel, skyrizi    Treatment Plan: Pending Chest X ray will send refills of Skyrizi  q 12 weeks as directed [facility]  Continue Clobetasol  0.05% cream to aa's QD-BID  Reviewed risks of biologics including immunosuppression, infections, injection site reaction, and failure to improve condition. Goal is control of skin condition, not cure.  Some older biologics such as Humira and Enbrel may slightly increase risk of malignancy and may worsen congestive heart failure.  Taltz and Cosentyx may cause inflammatory bowel disease to flare. The use of biologics requires long term medication management, including periodic office visits and monitoring of blood work.   Long term medication management.  Patient is using long term (months to years) prescription medication  to control their dermatologic condition.  These medications require periodic monitoring to evaluate for efficacy and side effects and may require periodic laboratory monitoring.   ACTINIC DAMAGE - chronic, secondary to cumulative UV radiation exposure/sun exposure over time - diffuse scaly erythematous macules with underlying dyspigmentation - Recommend daily broad spectrum sunscreen SPF 30+ to sun-exposed areas, reapply every 2 hours as needed.  - Recommend staying in the shade or wearing long sleeves, sun glasses (UVA+UVB protection) and wide brim hats (4-inch brim around the entire circumference of the hat). - Call  for new or changing lesions.  SEBORRHEIC KERATOSIS - Stuck-on, waxy, tan-brown papules and/or plaques  - Benign-appearing - Discussed benign etiology and prognosis. - Observe - Call for any changes  Return in about 6 months (around 03/11/2024) for psoriasis.  IRandee Busing, CMA, am acting as scribe for Celine Collard, MD.  Documentation: I have reviewed the above documentation for accuracy and completeness, and I agree with the above.  Celine Collard, MD

## 2023-09-09 NOTE — Progress Notes (Signed)
 Patient ID: Louis Carney, male   DOB: [birth year], 43 y.o.   MRN: [id number]   HPI: Louis Carney is a 43 y.o.-year-old male, initially referred by his PCP, Louis Carney, returning for follow-up for DM2, dx in 09/2018, non-insulin -dependent, uncontrolled, without long-term complications.  Last visit 6 months ago.  He is not usually compliant with appointments.  Interim history: No increased urination, blurry vision, nausea, chest pain.  He has cervical spondylosis >> had cervical surgery 06/2023 >> pain improved. He will need lumbar surgery next.  Reviewed history: He had dizziness, blurry vision >> checked sugar with neighbor's meter >> CBG high (HI, then rechecked: 540).  He saw PCP >> HbA1c was high x2 >> added insulin .  He felt much better after adding insulin .  However, sugars were still extremely high at last visit so we added rapid acting insulin  and I made specific suggestions about his diet and alcohol intake.  He was able to come off insulin  completely in 2023.  Reviewed HbA1c levels: Lab Results  Component Value Date   HGBA1C 5.7 (A) 03/11/2023   HGBA1C 5.6 12/19/2022   HGBA1C 5.3 09/06/2022   HGBA1C 5.6 06/27/2022   HGBA1C 5.2 01/16/2022   HGBA1C 5.5 09/04/2021   HGBA1C 5.5 02/20/2021   HGBA1C 8.3 (A) 07/22/2020   HGBA1C 6.4 (A) 01/18/2020   HGBA1C 6.2 (A) 08/21/2019   Currently on: - Ozempic  0.5 >> 1 mg weekly  He was previously on NovoLog  but stopped around 10/2020 after he ran out. He was previously on Lantus  25 units twice a day, but then reduced and stopped at the beginning of 2023.  Pt checks his sugars once a day: - am: 88-120 >> 98-111 >> 106-132 >> 80-121 >> 100-120 - 2h after b'fast: n/c - before lunch: 300-340 >> .Louis AasAaron Carney 140s >> 97-126 >> n/c >> 80-100 - 2h after lunch: n/c - before dinner: 340 >> 128-132 >> n/c - 2h after dinner: n/c - bedtime:  80s-123 >> 90s-110s >> 100-130s >> n/c >> 100-120 - nighttime: n/c Lowest sugar was 78 >> ... 80 >> 79; it is  unclear at which CBG level he has hypoglycemia awareness. Highest sugar was 460 ...>>130s >> 120 >> 120.  Glucometer:ReliOn  Pt's meals are: - Breakfast: skips - Lunch: [place] sandwich  - Dinner: lasagna, hamburger, steak + potatoes, pasta - Snacks:cookies, chips. He does not like greens. In the past, he was drinking 4-6 shots of vodka, 6 pack beer EVERY night >> then 5 Bud Light beers a night >> then liquor/vodka >> stopped.  -No CKD.  Last BUN/creatinine:  Lab Results  Component Value Date   BUN 14 07/03/2023   BUN 9 12/19/2022   CREATININE 0.65 07/03/2023   CREATININE 0.74 (L) 12/19/2022   Lab Results  Component Value Date   MICRALBCREAT 5.0 09/06/2022   MICRALBCREAT 4.6 02/20/2021   MICRALBCREAT 57 (H) 10/27/2018  On olmesartan .  -He had significant hypertriglyceridemia, which improved at last check; last set of lipids: Lab Results  Component Value Date   CHOL 163 12/19/2022   HDL 25 (L) 12/19/2022   LDLCALC 97 12/19/2022   LDLDIRECT 127.0 09/06/2022   TRIG 237 (H) 12/19/2022   CHOLHDL 6.5 (H) 12/19/2022   On Crestor  20 << 10 mg daily.  He has a history of transaminitis.  Latest liver tests reviewed: Lab Results  Component Value Date   ALT 26 12/19/2022   AST 18 12/19/2022   ALKPHOS 63 12/19/2022   BILITOT 0.6 12/19/2022  He sees Dr.  Gessner with [facility].    - last eye exam was 2021 (no diabetic exam in 01/2023) - as he was advised to have a sleep study first. Prev.  No DR reportedly. He saw ophthalmology (Louis Carney).  -+ numbness and tingling in his R leg - from back pain - see Louis Carney.  Last foot exam was 09/06/2022 here in clinic.  Pt has FH of DM in father - type 2 DM. + significant alcoholism FH.  He is on Risankizumab  in 04/2018 for psoriasis >> working. Prev. Embrel, Humira, etc. his psoriasis is much improved on meds. No history of pancreatitis or personal or family history of medullary thyroid  cancer. He has sinusitis >> saw ENT.  ROS: + See  HPI + Back pain -PCP previously referred him to pain clinic. + Rash on hands (psoriasis) >> much improved >> now seeing Louis Carney - on Skyrizi   I reviewed pt's medications, allergies, PMH, social hx, family hx, and changes were documented in the history of present illness. Otherwise, unchanged from my initial visit note.  Past Medical History:  Diagnosis Date   Alcohol abuse    Arthritis    Carpal tunnel syndrome of right wrist 12/2017   Cervical disc herniation    C4-5   Cervical myelopathy (HCC)    Chronic sinusitis    GERD (gastroesophageal reflux disease)    Headache    migraines   HNP (herniated nucleus pulposus), lumbar 02/2018   Hyperlipidemia, mixed    Hypertension associated with diabetes (HCC)    Pneumonia    Psoriasis    since age 30 years   TB lung, latent 11/06/2017   Transaminitis 01/29/2018   Tuberculosis    latent TB infection on treatment   Type 2 diabetes mellitus with hyperglycemia (HCC)    Past Surgical History:  Procedure Laterality Date   CARPAL TUNNEL RELEASE Right 01/15/2018   Procedure: Right Carpal tunnel release;  Surgeon: Audie Bleacher, MD;  Location: [facility] OR;  Service: Neurosurgery;  Laterality: Right;  Right Carpal tunnel release   CERVICAL DISC ARTHROPLASTY N/A 07/11/2023   Procedure: C4-5 ARTHROPLASTY;  Surgeon: Carroll Clamp, MD;  Location: [facility] ORS;  Service: Neurosurgery;  Laterality: N/A;   CERVICAL SPINE SURGERY     LUMBAR LAMINECTOMY/DECOMPRESSION MICRODISCECTOMY Right 03/07/2018   Procedure: Right Lumbar 5 Sacral 1 Microdiscectomy;  Surgeon: Audie Bleacher, MD;  Location: [facility] OR;  Service: Neurosurgery;  Laterality: Right;  Right Lumbar 5 Sacral 1 Microdiscectomy   Social History   Socioeconomic History   Marital status: Married    Spouse name: Louis Carney   Number of children: 3   Years of education: Not on file   Highest education level: 11th grade  Occupational History   Occupation: disabled  Tobacco Use   Smoking  status: Every Day    Current packs/day: 1.00    Average packs/day: 1 pack/day for 15.0 years (15.0 ttl pk-yrs)    Types: Cigarettes   Smokeless tobacco: Never  Vaping Use   Vaping status: Never Used  Substance and Sexual Activity   Alcohol use: Yes    Alcohol/week: 24.0 standard drinks of alcohol    Types: 24 Standard drinks or equivalent per week    Comment: 6-8 beer per day   Drug use: Never   Sexual activity: Yes    Partners: Female    Birth control/protection: None  Other Topics Concern   Not on file  Social History Narrative   Married, 3 biologic children 2 stepchildren  disabled due to back pain   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   6-8 beers nightly   Smoker   No drug use   Social Drivers of Corporate investment banker Strain: Low Risk  (08/29/2023)   Overall Financial Resource Strain (CARDIA)    Difficulty of Paying Living Expenses: Not hard at all  Food Insecurity: No Food Insecurity (08/29/2023)   Hunger Vital Sign    Worried About Running Out of Food in the Last Year: Never true    Ran Out of Food in the Last Year: Never true  Transportation Needs: No Transportation Needs (08/29/2023)   PRAPARE - Administrator, Civil Service (Medical): No    Lack of Transportation (Non-Medical): No  Physical Activity: Insufficiently Active (08/29/2023)   Exercise Vital Sign    Days of Exercise per Week: 7 days    Minutes of Exercise per Session: 20 min  Stress: No Stress Concern Present (08/29/2023)   [employer] of Occupational Health - Occupational Stress Questionnaire    Feeling of Stress : Not at all  Social Connections: Socially Isolated (08/29/2023)   Social Connection and Isolation Panel [NHANES]    Frequency of Communication with Friends and Family: More than three times a week    Frequency of Social Gatherings with Friends and Family: Once a week    Attends Religious Services: Never    Automotive engineer or Organizations: No    Attends Banker Meetings: Never    Marital Status: Divorced  Catering manager Violence: Not At Risk (08/29/2023)   Humiliation, Afraid, Rape, and Kick questionnaire    Fear of Current or Ex-Partner: No    Emotionally Abused: No    Physically Abused: No    Sexually Abused: No   Current Outpatient Medications on File Prior to Visit  Medication Sig Dispense Refill   acetaminophen  (TYLENOL ) 500 MG tablet Take 2 tablets (1,000 mg total) by mouth every 6 (six) hours as needed. (Patient not taking: Reported on 08/29/2023) 30 tablet 0   amLODipine  (NORVASC ) 10 MG tablet Take 10 mg by mouth at bedtime.     Blood Glucose Monitoring Suppl (ONETOUCH VERIO) w/Device KIT Inject 1 Lancet into the skin as directed.     celecoxib  (CELEBREX ) 200 MG capsule Take 1 capsule by mouth twice daily 60 capsule 2   clobetasol  cream (TEMOVATE ) 0.05 % Apply to aa psoriasis QD 3d/wk. Avoid applying to face, groin, and axilla. Use as directed. Long-term use can cause thinning of the skin. (Patient not taking: Reported on 08/29/2023) 60 g 1   esomeprazole (NEXIUM) 20 MG capsule Take 20 mg by mouth at bedtime.     ibuprofen (ADVIL) 200 MG tablet Take 800 mg by mouth daily.     Naphazoline HCl (CLEAR EYES OP) Place 1 drop into both eyes 2 (two) times daily.     Naphazoline-Pheniramine (OPCON-A OP) Place 1 drop into both eyes daily as needed (allergies).     olmesartan  (BENICAR ) 40 MG tablet Take 40 mg by mouth at bedtime.     OneTouch Delica Lancets 33G MISC USE TO TEST BLOOD SUGARS 6 TIMES DAILY (BEFORE MEALS AND 2 HOURS AFTER MEALS) 200 each 0   ONETOUCH VERIO test strip USE TO TEST 6 TIMES DAILY (BEFORE MEALS AND 2 HOURS AFTER MEALS) 200 each 0   oxyCODONE  (ROXICODONE ) 5 MG immediate release tablet Take 1 tablet (5 mg total) by mouth every  6 (six) hours as needed for severe pain (pain score 7-10). (Patient not taking: Reported on 08/29/2023) 30 tablet 0   Phenylephrine  HCl  (SINEX REGULAR NA) Place 1 spray into the nose daily as needed (congestion).     rosuvastatin  (CRESTOR ) 20 MG tablet Take 1 tablet (20 mg total) by mouth daily. (Patient taking differently: Take 20 mg by mouth at bedtime.) 90 tablet 3   Semaglutide , 1 MG/DOSE, (OZEMPIC , 1 MG/DOSE,) 4 MG/3ML SOPN Inject 1 mg into the skin once a week. 9 mL 3   sildenafil  (VIAGRA ) 100 MG tablet TAKE 1/2 (ONE-HALF) TABLET BY MOUTH 30 MINUTES PRIOR TO INTERCOURSE AS NEEDED 30 tablet 0   SKYRIZI  PEN 150 MG/ML pen INJECT 1 PEN SUBCUTANEOUSLY EVERY 12 WEEKS AS DIRECTED. 1 mL 0   No current facility-administered medications on file prior to visit.   Allergies  Allergen Reactions   Methocarbamol  Nausea And Vomiting    Vomited after taking 1 pill   Family History  Problem Relation Age of Onset   Alcohol abuse Mother    Hypertension Mother    Lung cancer Mother    Alcohol abuse Father    Hypertension Father    Colon cancer Father        dx in his late 94's   Esophageal cancer Neg Hx    Rectal cancer Neg Hx    PE: BP 130/70   Pulse 85   Ht 5\' 8"  (1.727 m)   Wt 210 lb (95.3 kg)   SpO2 96%   BMI 31.93 kg/m  Wt Readings from Last 10 Encounters:  09/09/23 210 lb (95.3 kg)  08/21/23 210 lb (95.3 kg)  08/06/23 210 lb (95.3 kg)  07/11/23 217 lb (98.4 kg)  07/03/23 217 lb (98.4 kg)  06/26/23 213 lb (96.6 kg)  06/14/23 210 lb (95.3 kg)  05/24/23 206 lb (93.4 kg)  05/23/23 213 lb 9.6 oz (96.9 kg)  05/08/23 207 lb 12.8 oz (94.3 kg)   Constitutional: overweight, in NAD Eyes:  EOMI, no exophthalmos ENT: no neck masses, no cervical lymphadenopathy Cardiovascular: RRR, No MRG Respiratory: CTA B Musculoskeletal: no deformities Skin:no rashes Neurological: no tremor with outstretched hands Diabetic Foot Exam - Simple   Simple Foot Form Diabetic Foot exam was performed with the following findings: Yes 09/09/2023  1:03 PM  Visual Inspection No deformities, no ulcerations, no other skin breakdown bilaterally:  Yes Sensation Testing Intact to touch and monofilament testing bilaterally: Yes Pulse Check Posterior Tibialis and Dorsalis pulse intact bilaterally: Yes Comments    ASSESSMENT: 1. DM, noninsulin-dependent, without long-term complications -We ruled out antipancreatic autoimmunity and insulin  deficiency  Component     Latest Ref Rng 01/20/2019  Glucose, Plasma     65 - 99 mg/dL 811 (H)   C-Peptide     0.80 - 3.85 ng/mL 2.43   Islet Cell Ab     Neg:<1:1  Negative   ZNT8 Antibodies     U/mL <15   Glutamic Acid Decarb Ab     <5 IU/mL <5     2.  Hyperlipidemia (mixed)  3.  Obesity class I  PLAN:  1. Patient with controlled type 2 diabetes, previously insulin -dependent but off insulin  after he started to exercise at the gym and cutting out alcohol.  HbA1c at last visit was excellent, at 5.7%, only slightly higher than before.  Sugars were at goal but he was only checking it in the morning.  I advised him to check some sugars later in the  day but I did not change his regimen.  He continues on Ozempic  1 mg weekly, tolerated well. - At today's visit, his sugars are all at goal throughout the day.  There are no hyperglycemic spikes or low blood sugars.  We can continue the same regimen for now. - I suggested to:  Patient Instructions  Please continue: - Ozempic  1 mg weekly   Please stop at the lab.  Please return in 6 months.   - we checked his HbA1c: 5.5% (lower) - advised to check sugars at different times of the day - 1x a day, rotating check times - advised for yearly eye exams >> he is not up-to-date with diabetic eye exams.  He had an eye exam in 01/2023, but this was not completed as he was told that he needed a sleep study first.  He was not explained the reason for this.  He is trying to find another eye doctor. - return to clinic in 6 months  2.  Mixed hyperlipidemia -Reviewed latest lipid panel from 11/2022: LDL improved, now at goal, triglycerides elevated, HDL  low: Lab Results  Component Value Date   CHOL 163 12/19/2022   HDL 25 (L) 12/19/2022   LDLCALC 97 12/19/2022   LDLDIRECT 127.0 09/06/2022   TRIG 237 (H) 12/19/2022   CHOLHDL 6.5 (H) 12/19/2022  - He is on Crestor  20 mg daily, tolerating well - he will be due for another lipid panel this summer -he has an annual physical exam coming up with PCP in 11/2022.  3.  Obesity class I -We discussed in the past about cutting out alcohol, starches, fatty foods and snacks.  He was also constantly eating between meals and I strongly advised him to try to eliminate snacks but snack on fruit if absolutely needed.  I previously recommended a more plant-based diet and he tried it but was not able to follow weight due to being on the road frequently. - He cut out alcohol in 2023 - He lost 4 pounds before last visit, previously gained 4 -Weight is stable at today's visit - Will continue Ozempic  which should also help with weight loss  Emilie Harden, MD PhD [facility] Endocrinology

## 2023-09-09 NOTE — Patient Instructions (Addendum)
 Seborrheic Keratosis  What causes seborrheic keratoses? Seborrheic keratoses are harmless, common skin growths that first appear during adult life.  As time goes by, more growths appear.  Some people may develop a large number of them.  Seborrheic keratoses appear on both covered and uncovered body parts.  They are not caused by sunlight.  The tendency to develop seborrheic keratoses can be inherited.  They vary in color from skin-colored to gray, brown, or even black.  They can be either smooth or have a rough, warty surface.   Seborrheic keratoses are superficial and look as if they were stuck on the skin.  Under the microscope this type of keratosis looks like layers upon layers of skin.  That is why at times the top layer may seem to fall off, but the rest of the growth remains and re-grows.    Treatment Seborrheic keratoses do not need to be treated, but can easily be removed in the office.  Seborrheic keratoses often cause symptoms when they rub on clothing or jewelry.  Lesions can be in the way of shaving.  If they become inflamed, they can cause itching, soreness, or burning.  Removal of a seborrheic keratosis can be accomplished by freezing, burning, or surgery. If any spot bleeds, scabs, or grows rapidly, please return to have it checked, as these can be an indication of a skin cancer.  Cryotherapy Aftercare  Wash gently with soap and water everyday.   Apply Vaseline and Band-Aid daily until healed.     Biopsy Wound Care Instructions  Leave the original bandage on for 24 hours if possible.  If the bandage becomes soaked or soiled before that time, it is OK to remove it and examine the wound.  A small amount of post-operative bleeding is normal.  If excessive bleeding occurs, remove the bandage, place gauze over the site and apply continuous pressure (no peeking) over the area for 30 minutes. If this does not work, please call our clinic as soon as possible or page your doctor if it is  after hours.   Once a day, cleanse the wound with soap and water. It is fine to shower. If a thick crust develops you may use a Q-tip dipped into dilute hydrogen peroxide (mix 1:1 with water) to dissolve it.  Hydrogen peroxide can slow the healing process, so use it only as needed.    After washing, apply petroleum jelly (Vaseline) or an antibiotic ointment if your doctor prescribed one for you, followed by a bandage.    For best healing, the wound should be covered with a layer of ointment at all times. If you are not able to keep the area covered with a bandage to hold the ointment in place, this may mean re-applying the ointment several times a day.  Continue this wound care until the wound has healed and is no longer open.   Itching and mild discomfort is normal during the healing process. However, if you develop pain or severe itching, please call our office.   If you have any discomfort, you can take Tylenol  (acetaminophen ) or ibuprofen as directed on the bottle. (Please do not take these if you have an allergy  to them or cannot take them for another reason).  Some redness, tenderness and white or yellow material in the wound is normal healing.  If the area becomes very sore and red, or develops a thick yellow-green material (pus), it may be infected; please notify us .    If you have  stitches, return to clinic as directed to have the stitches removed. You will continue wound care for 2-3 days after the stitches are removed.   Wound healing continues for up to one year following surgery. It is not unusual to experience pain in the scar from time to time during the interval.  If the pain becomes severe or the scar thickens, you should notify the office.    A slight amount of redness in a scar is expected for the first six months.  After six months, the redness will fade and the scar will soften and fade.  The color difference becomes less noticeable with time.  If there are any problems, return  for a post-op surgery check at your earliest convenience.  To improve the appearance of the scar, you can use silicone scar gel, cream, or sheets (such as Mederma or Serica) every night for up to one year. These are available over the counter (without a prescription).  Please call our office at [phone] for any questions or concerns.     Due to recent changes in healthcare laws, you may see results of your pathology and/or laboratory studies on MyChart before the doctors have had a chance to review them. We understand that in some cases there may be results that are confusing or concerning to you. Please understand that not all results are received at the same time and often the doctors may need to interpret multiple results in order to provide you with the best plan of care or course of treatment. Therefore, we ask that you please give us  2 business days to thoroughly review all your results before contacting the office for clarification. Should we see a critical lab result, you will be contacted sooner.   If You Need Anything After Your Visit  If you have any questions or concerns for your doctor, please call our main line at [phone] and press option 4 to reach your doctor's medical assistant. If no one answers, please leave a voicemail as directed and we will return your call as soon as possible. Messages left after 4 pm will be answered the following business day.   You may also send us  a message via MyChart. We typically respond to MyChart messages within 1-2 business days.  For prescription refills, please ask your pharmacy to contact our office. Our fax number is [phone].  If you have an urgent issue when the clinic is closed that cannot wait until the next business day, you can page your doctor at the number below.    Please note that while we do our best to be available for urgent issues outside of office hours, we are not available 24/7.   If you have an urgent issue  and are unable to reach us , you may choose to seek medical care at your doctor's office, retail clinic, urgent care center, or emergency room.  If you have a medical emergency, please immediately call 911 or go to the emergency department.  Pager Numbers  - Dr. Bary Likes: [phone]  - Dr. Annette Barters: [phone]  - Dr. Felipe Horton: [phone]   In the event of inclement weather, please call our main line at [phone] for an update on the status of any delays or closures.  Dermatology Medication Tips: Please keep the boxes that topical medications come in in order to help keep track of the instructions about where and how to use these. Pharmacies typically print the medication instructions only on the boxes and not directly on the  medication tubes.   If your medication is too expensive, please contact our office at [phone] option 4 or send us  a message through MyChart.   We are unable to tell what your co-pay for medications will be in advance as this is different depending on your insurance coverage. However, we may be able to find a substitute medication at lower cost or fill out paperwork to get insurance to cover a needed medication.   If a prior authorization is required to get your medication covered by your insurance company, please allow us  1-2 business days to complete this process.  Drug prices often vary depending on where the prescription is filled and some pharmacies may offer cheaper prices.  The website www.goodrx.com contains coupons for medications through different pharmacies. The prices here do not account for what the cost may be with help from insurance (it may be cheaper with your insurance), but the website can give you the price if you did not use any insurance.  - You can print the associated coupon and take it with your prescription to the pharmacy.  - You may also stop by our office during regular business hours and pick up a GoodRx coupon card.  - If you  need your prescription sent electronically to a different pharmacy, notify our office through [facility] or by phone at [phone] option 4.     Si Usted Necesita Algo Despus de Su Visita  Tambin puede enviarnos un mensaje a travs de Clinical cytogeneticist. Por lo general respondemos a los mensajes de MyChart en el transcurso de 1 a 2 das hbiles.  Para renovar recetas, por favor pida a su farmacia que se ponga en contacto con nuestra oficina. Franz Jacks de fax es [place] [phone].  Si tiene un asunto urgente cuando la clnica est cerrada y que no puede esperar hasta el siguiente da hbil, puede llamar/localizar a su doctor(a) al nmero que aparece a continuacin.   Por favor, tenga en cuenta que aunque hacemos todo lo posible para estar disponibles para asuntos urgentes fuera del horario de [place], no estamos disponibles las 24 horas del da, los 7 [address]  [address] de la [place].   Si tiene un problema urgente y no puede comunicarse con nosotros, puede optar por buscar atencin mdica  en el consultorio de su doctor(a), en una clnica privada, en un centro de atencin urgente o en una sala de emergencias.  Si tiene Engineer, drilling, por favor llame inmediatamente al 911 o vaya a la sala de emergencias.  Nmeros de bper  - Dr. Bary Likes: [phone]  - Dra. Annette Barters: [phone]  - Dr. Felipe Horton: [phone]   En caso de inclemencias del tiempo, por favor llame a Lajuan Pila principal al [phone] para una actualizacin sobre el [place] de cualquier retraso o cierre.  Consejos para la medicacin en dermatologa: Por favor, guarde las cajas en las que vienen los medicamentos de uso tpico para ayudarle a seguir las instrucciones sobre dnde y cmo usarlos. Las farmacias generalmente imprimen las instrucciones del medicamento slo en las cajas y no directamente en los tubos del [place].   Si su medicamento es muy caro, por favor, pngase en contacto con Bettyjane Brunet llamando al  [phone] y presione la opcin 4 o envenos un mensaje a travs de Clinical cytogeneticist.   No podemos decirle cul ser su copago por los medicamentos por adelantado ya que esto es diferente dependiendo de la cobertura de su seguro. Sin embargo, es posible que podamos encontrar un medicamento sustituto a  menor costo o llenar un formulario para que el seguro cubra el medicamento que se considera necesario.   Si se requiere una autorizacin previa para que su compaa de seguros [place] su medicamento, por favor permtanos de 1 a 2 das hbiles para completar este proceso.  Los precios de los medicamentos varan con frecuencia dependiendo del Environmental consultant de dnde se surte la receta y alguna farmacias pueden ofrecer precios ms baratos.  El sitio web www.goodrx.com tiene cupones para medicamentos de Health and safety inspector. Los precios aqu no tienen en cuenta lo que podra costar con la ayuda del seguro (puede ser ms barato con su seguro), pero el sitio web puede darle el precio si no utiliz Tourist information centre manager.  - Puede imprimir el cupn correspondiente y llevarlo con su receta a la farmacia.  - Tambin puede pasar por nuestra oficina durante el horario de atencin regular y Education officer, museum una tarjeta de cupones de GoodRx.  - Si necesita que su receta se enve electrnicamente a una farmacia diferente, informe a nuestra oficina a travs de MyChart de [place] o por telfono llamando al [phone] y presione la opcin 4.

## 2023-09-10 LAB — MICROALBUMIN / CREATININE URINE RATIO
Creatinine, Urine: 178 mg/dL (ref 20–320)
Microalb Creat Ratio: 189 mg/g{creat} — ABNORMAL HIGH (ref <30)
Microalb, Ur: 33.7 mg/dL

## 2023-09-11 ENCOUNTER — Other Ambulatory Visit: Payer: Self-pay | Admitting: Internal Medicine

## 2023-09-11 ENCOUNTER — Ambulatory Visit: Payer: Self-pay | Admitting: Internal Medicine

## 2023-09-11 DIAGNOSIS — Z794 Long term (current) use of insulin: Secondary | ICD-10-CM

## 2023-09-12 ENCOUNTER — Ambulatory Visit: Payer: Self-pay | Admitting: Dermatology

## 2023-09-12 DIAGNOSIS — L400 Psoriasis vulgaris: Secondary | ICD-10-CM

## 2023-09-12 LAB — SURGICAL PATHOLOGY

## 2023-09-16 NOTE — Telephone Encounter (Signed)
-----   Message from Celine Collard sent at 09/12/2023  7:38 PM EDT ----- FINAL DIAGNOSIS        1. Skin, right sacral area :       VERRUCOUS PSORIASIS, SEE DESCRIPTION   Very Thick "verrucous psoriasis" As suspected May benefit from IL steroid injection in this area - may schedule if interested

## 2023-09-16 NOTE — Telephone Encounter (Signed)
 Discussed biopsy results with pt, he will schedule a return visit for IL steroid injection

## 2023-09-25 ENCOUNTER — Ambulatory Visit
Admission: RE | Admit: 2023-09-25 | Discharge: 2023-09-25 | Disposition: A | Discharge status: Home / Self Care | Attending: Physician Assistant | Admitting: Physician Assistant

## 2023-09-25 ENCOUNTER — Ambulatory Visit (INDEPENDENT_AMBULATORY_CARE_PROVIDER_SITE_OTHER): Payer: Medicare Other | Admitting: Physician Assistant

## 2023-09-25 ENCOUNTER — Ambulatory Visit
Admission: RE | Admit: 2023-09-25 | Discharge: 2023-09-25 | Disposition: A | Discharge status: Home / Self Care | Source: Ambulatory Visit | Attending: Dermatology | Admitting: Dermatology

## 2023-09-25 ENCOUNTER — Encounter: Payer: Self-pay | Admitting: Physician Assistant

## 2023-09-25 ENCOUNTER — Other Ambulatory Visit: Payer: Self-pay

## 2023-09-25 ENCOUNTER — Ambulatory Visit
Admission: RE | Admit: 2023-09-25 | Discharge: 2023-09-25 | Disposition: A | Discharge status: Home / Self Care | Attending: Dermatology | Admitting: Dermatology

## 2023-09-25 VITALS — BP 146/88 | Ht 68.0 in | Wt 210.0 lb

## 2023-09-25 DIAGNOSIS — Z8611 Personal history of tuberculosis: Secondary | ICD-10-CM | POA: Diagnosis not present

## 2023-09-25 DIAGNOSIS — R202 Paresthesia of skin: Secondary | ICD-10-CM

## 2023-09-25 DIAGNOSIS — M4712 Other spondylosis with myelopathy, cervical region: Secondary | ICD-10-CM | POA: Diagnosis not present

## 2023-09-25 DIAGNOSIS — M4802 Spinal stenosis, cervical region: Secondary | ICD-10-CM | POA: Diagnosis not present

## 2023-09-25 DIAGNOSIS — R200 Anesthesia of skin: Secondary | ICD-10-CM

## 2023-09-25 DIAGNOSIS — Z09 Encounter for follow-up examination after completed treatment for conditions other than malignant neoplasm: Secondary | ICD-10-CM

## 2023-09-25 DIAGNOSIS — R918 Other nonspecific abnormal finding of lung field: Secondary | ICD-10-CM | POA: Diagnosis not present

## 2023-09-25 MED ORDER — GABAPENTIN 300 MG PO CAPS
300.0000 mg | ORAL_CAPSULE | Freq: Three times a day (TID) | ORAL | 2 refills | Status: DC
Start: 2023-09-25 — End: 2023-11-13

## 2023-09-25 NOTE — Progress Notes (Signed)
 REFERRING PHYSICIAN:  Laneta Pintos, Md [address] [place],  [place] [address]  DOS: C4-5 arthroplasty, 07/11/2023  HISTORY OF PRESENT ILLNESS: Louis Carney is 10 weeks status post C4-5 arthroplasty. Overall, he is concerned.  He states that 2 weeks ago he started having severe pain as well as numbness and tingling in his left hand.  He denies any precipitating event.  States the majority of his numbness and tingling is in the tips of his fingers however he has severe pain from his middle digit on the front and backside of his arm.  He states he constantly has to try to shake out his hand in order to feel it or put his arm over his head secondary to pain.  He has been using ibuprofen which has not been making much of an impact.  The numbness and tingling is waking him up at night he is also noticed that he has begun dropping things.  He feels as though his left hand has become weaker.  His ambulation has continued to improve.    PHYSICAL EXAMINATION:  NEUROLOGICAL:  General: In no acute distress.   Awake, alert, oriented to person, place, and time.  Pupils equal round and reactive to light.    LUE: Positive carpal compression test, positive Tinel, positive Phalen's, positive reverse Phalen's.   Strength: Side Biceps Triceps Deltoid Interossei Grip Wrist Ext. Wrist Flex.  R 5 4+ 4+ 4+ 4+ 4+ 5  L 5 5 5 4 4 5  4-     Incision c/d/I He continues to be hyperreflexic. Ambulation today is smoother and less guarded.  No longer wavering significantly.  Imaging:  I reviewed his x-rays today, shows that the C4-5 arthroplasty is in good position.  No evidence of acute failure.  Assessment / Plan: Louis Carney is 10 weeks status post C4-5 arthroplasty. Overall, he is concerned.  He states that 2 weeks ago he started having severe pain as well as numbness and tingling in his left hand.  He denies any precipitating event.  States the majority of his numbness and tingling is in the tips of  his fingers however he has severe pain from his middle digit on the front and backside of his arm.  He states he constantly has to try to shake out his hand in order to feel it or put his arm over his head secondary to pain.  He has been using ibuprofen which has not been making much of an impact.  The numbness and tingling is waking him up at night he is also noticed that he has begun dropping things.  He feels as though his left hand has become weaker.  His ambulation has continued to improve.  On examination, his left upper extremity was positive carpal compression test, positive Tinel, positive Phalen's, positive reverse Phalen's.  Previously had carpal tunnel surgery in his right hand.  It was a pleasure to see patient in clinic today.  It is unfortunate that he has been so his pain currently.  He very well could be experiencing reinnervation symptoms following surgery in setting of cervical myelopathy.  Advised patient that neuropathic pain medicine will likely help.  This is been prescribed for him.  In addition he does have positive provocative signs of carpal tunnel syndrome.  Advised patient to start wearing a brace at night to try to help him from waking up at night.  I have ordered an EMG for evaluation of his left upper extremity.  Pending results  could potentially need additional neck imaging in the future.  Red flag symptoms were reviewed.  Patient was encouraged to reach out to me for questions or concerns in the future.     Advised to contact the office if any questions or concerns arise.   Ludwig Safer, PA-C Dept of Neurosurgery

## 2023-09-30 ENCOUNTER — Encounter: Payer: Self-pay | Admitting: Neurology

## 2023-10-01 ENCOUNTER — Other Ambulatory Visit: Payer: Self-pay

## 2023-10-01 DIAGNOSIS — R202 Paresthesia of skin: Secondary | ICD-10-CM

## 2023-10-02 ENCOUNTER — Other Ambulatory Visit: Payer: Self-pay | Admitting: Family Medicine

## 2023-10-02 NOTE — Telephone Encounter (Unsigned)
 Copied from CRM [id number]. Topic: Clinical - Medication Question >> Oct 02, 2023 12:38 PM Chrystal Crape R wrote: Pt is calling to find out if he has or can have a medication for his gout. He does not remember the name of the medication he had.

## 2023-10-03 MED ORDER — COLCHICINE 0.6 MG PO TABS: 0.6000 mg | ORAL_TABLET | Freq: Every day | ORAL | 2 refills | Status: DC

## 2023-10-03 MED ORDER — SKYRIZI PEN 150 MG/ML ~~LOC~~ SOAJ: SUBCUTANEOUS | 2 refills | Status: DC

## 2023-10-03 NOTE — Telephone Encounter (Signed)
 Patient advised of Xray results and Skyrizi  refills sent in. aw

## 2023-10-03 NOTE — Addendum Note (Signed)
 Addended by: Randee Busing A on: 10/03/2023 10:41 AM   Modules accepted: Orders

## 2023-10-03 NOTE — Telephone Encounter (Addendum)
  Tried calling patient regarding xray results no answer. LM. Sent patient refills to Skyrizi  to pharmacy.   ----- Message from Celine Collard sent at 10/03/2023 10:25 AM EDT ----- Chest Xray from 09/25/2023 showed: No changes from previous CXR. No evidence of TB  Pt on Skyrizi  for Psoriasis.   If Skyrizi  not already sent, please send Skyrizi  to pharmacy. Keep 6 mos follow up appt

## 2023-10-07 ENCOUNTER — Ambulatory Visit: Admitting: Dermatology

## 2023-10-14 ENCOUNTER — Ambulatory Visit (INDEPENDENT_AMBULATORY_CARE_PROVIDER_SITE_OTHER): Admitting: Neurology

## 2023-10-14 DIAGNOSIS — M5412 Radiculopathy, cervical region: Secondary | ICD-10-CM

## 2023-10-14 DIAGNOSIS — R202 Paresthesia of skin: Secondary | ICD-10-CM | POA: Diagnosis not present

## 2023-10-14 DIAGNOSIS — G5603 Carpal tunnel syndrome, bilateral upper limbs: Secondary | ICD-10-CM

## 2023-10-14 NOTE — Procedures (Signed)
 [facility] Neu[facility]  [address] [place], [address]  [place], [place] [address] Tel: [phone] Fax: [phone] Test Date:  10/14/2023  Patient: Louis Carney DOB: 03-Mar-1981 Physician: Rommie Coats, MD  Sex: Male Height: 5' 8 Ref Phys: Arland Bellis  ID#: [id number]   Technician:    History: This is a 43 year old male with numbness and tingling in left hand.  NCV & EMG Findings: Extensive electrodiagnostic evaluation of the left upper limb with additional nerve conduction studies of the right upper limb shows: Left median sensory response is absent. Right median sensory response shows prolonged distal peak latency (3.5 ms) and reduced amplitude (15 V). Bilateral ulnar and left radial sensory responses are within normal limits. Bilateral median (APB) motor responses show prolonged distal onset latency (L6.6, R4.0 ms) and reduced amplitude (L2.1, R3.0 mV). Left ulnar (ADM) motor response is within normal limits. Chronic motor axon loss changes without accompanying active denervation changes are seen in the left first dorsal interosseous, extensor indicis proprius, and abductor pollicis brevis muscles. Cervical paraspinal muscles were deferred due to prior cervical spine surgery.  Neuromuscular Ultrasound Findings: High frequency (4.0-16.0 MHz) B-mode, nonvascular ultrasound of the bilateral upper limbs shows: Cross sectional areas (CSA) of bilateral median (wrist to forearm) are increased at bilateral wrists (L 20.0 mm2, R 22.6 mm2). Wrist to forearm ratios of bilateral median nerves are increased (L 2.44, R 2.40). No other obvious lesion involving the adjacent bone or tendon is identified. No definite vascular abnormalities.  Impression: This is an abnormal study. The findings are most consistent with the following: Evidence of bilateral median mononeuropathy at or distal to the wrist, consistent with carpal tunnel syndrome, severe in degree electrically on the left and mild to  moderate in degree electrically on the right. The residuals of an old intraspinal canal lesion (ie: motor radiculopathy) at left C8 root or segment, mild in degree electrically.   ___________________________ Rommie Coats, MD    NCS+ Motor Nerve Results    Latency Amplitude F-Lat Segment Distance CV Comment  Site (ms) Norm (mV) Norm (ms)  (cm) (m/s) Norm   Left Median (APB) Motor  Wrist *6.6  < 3.9 *2.1  > 6.0        Elbow 11.9 - 2.0 -  Elbow-Wrist 27 51  > 50   Right Median (APB) Motor  Wrist *4.0  < 3.9 *3.0  > 6.0        Left Ulnar (ADM) Motor  Wrist 1.78  < 3.1 10.3  > 7.0        Bel elbow 6.0 - 8.8 -  Bel elbow-Wrist 21 50  > 50   Ab elbow 8.0 - 8.6 -  Ab elbow-Bel elbow 10 50 -    Sensory Sites    Neg Peak Lat Amplitude (O-P) Segment Distance Velocity Comment  Site (ms) Norm (V) Norm  (cm) (ms)   Left Median Sensory  Wrist-Dig II *NR  < 3.4 *NR  > 20 Wrist-Dig II 13    Right Median Sensory  Wrist-Dig II *3.5  < 3.4 *15  > 20 Wrist-Dig II 13    Left Radial Sensory  Forearm-Wrist 2.2  < 2.7 21  > 18 Forearm-Wrist 10    Left Ulnar Sensory  Wrist-Dig V 2.5  < 3.1 15  > 12 Wrist-Dig V 11    Right Ulnar Sensory  Wrist-Dig V 2.9  < 3.1 15  > 12 Wrist-Dig V 11     EMG+  Side Muscle Ins.Act Fibs Fasc Recrt Amp Dur Poly Activation Comment  Left FDI Nml Nml Nml *1- *1+ *1+ Nml Nml N/A  Left EIP Nml Nml Nml *1- *1+ *1+ Nml Nml N/A  Left Pronator teres Nml Nml Nml Nml Nml Nml Nml Nml N/A  Left APB Nml Nml Nml *SMU *1+ *1+ *1+ Nml N/A  Left Biceps Nml Nml Nml Nml Nml Nml Nml Nml N/A  Left Triceps Nml Nml Nml Nml Nml Nml Nml Nml N/A  Left Deltoid Nml Nml Nml Nml Nml Nml Nml Nml N/A   Nerve Measurements   Site Area Segment Area Ratio Mobility Vascularity Comment   mm Norm   Norm     Left Median  Wrist *20.0  < 13.0         Forearm 8.2  < 10.7  Wrist - Forearm *2.44  < 1.50      Right Median  Wrist *22.6  < 13.0         Forearm 9.4  < 10.7  Wrist - Forearm *2.40  <  1.50          Waveforms:  Motor        Sensory

## 2023-10-15 ENCOUNTER — Ambulatory Visit: Payer: Self-pay

## 2023-10-15 ENCOUNTER — Other Ambulatory Visit: Payer: Self-pay | Admitting: Family Medicine

## 2023-10-15 DIAGNOSIS — N529 Male erectile dysfunction, unspecified: Secondary | ICD-10-CM

## 2023-10-16 ENCOUNTER — Other Ambulatory Visit: Payer: Self-pay

## 2023-10-16 ENCOUNTER — Ambulatory Visit (INDEPENDENT_AMBULATORY_CARE_PROVIDER_SITE_OTHER): Admitting: Physician Assistant

## 2023-10-16 ENCOUNTER — Telehealth: Payer: Self-pay

## 2023-10-16 DIAGNOSIS — G5603 Carpal tunnel syndrome, bilateral upper limbs: Secondary | ICD-10-CM

## 2023-10-16 DIAGNOSIS — Z01818 Encounter for other preprocedural examination: Secondary | ICD-10-CM

## 2023-10-16 NOTE — H&P (View-Only) (Signed)
 Called patient to discuss recent EMG results which show bilateral carpal tunnel syndrome, severe on the left and mild to moderate on the right.  The right upper extremity did previously have a release complete the patient stated that he had very little relief from.  Of note he is approximately 13 weeks status post C4-5 arthroplasty.  At this time left hand bothers him the most and is constantly with numbness and tingling.  He feels that this is affecting his strength and causing a lot of pain.  Carpal tunnel release was offered of his left upper extremity.  Plan for ultrasound-guided carpal tunnel release with the possibility of an open release.  Patient is in agreement.  All risk and benefits related to the procedure were discussed at length including damage to nerve, no improvement of symptoms, need for additional surgery, infection amongst others.      [facility]  [address] [place], [address]  [place], [place] [address] Tel: [phone] Fax: [phone] Test Date:  10/14/2023   Patient: Louis Carney DOB: 1981/02/18 Physician: Rommie Coats, MD  Sex: Male Height: 5' 8 Ref Phys: Arland Bellis  ID#: [id number]     Technician:      History: This is a 43 year old male with numbness and tingling in left hand.   NCV & EMG Findings: Extensive electrodiagnostic evaluation of the left upper limb with additional nerve conduction studies of the right upper limb shows: Left median sensory response is absent. Right median sensory response shows prolonged distal peak latency (3.5 ms) and reduced amplitude (15 V). Bilateral ulnar and left radial sensory responses are within normal limits. Bilateral median (APB) motor responses show prolonged distal onset latency (L6.6, R4.0 ms) and reduced amplitude (L2.1, R3.0 mV). Left ulnar (ADM) motor response is within normal limits. Chronic motor axon loss changes without accompanying active denervation changes are seen in the left first dorsal  interosseous, extensor indicis proprius, and abductor pollicis brevis muscles. Cervical paraspinal muscles were deferred due to prior cervical spine surgery.   Neuromuscular Ultrasound Findings: High frequency (4.0-16.0 MHz) B-mode, nonvascular ultrasound of the bilateral upper limbs shows: Cross sectional areas (CSA) of bilateral median (wrist to forearm) are increased at bilateral wrists (L 20.0 mm2, R 22.6 mm2). Wrist to forearm ratios of bilateral median nerves are increased (L 2.44, R 2.40). No other obvious lesion involving the adjacent bone or tendon is identified. No definite vascular abnormalities.   Impression: This is an abnormal study. The findings are most consistent with the following: Evidence of bilateral median mononeuropathy at or distal to the wrist, consistent with carpal tunnel syndrome, severe in degree electrically on the left and mild to moderate in degree electrically on the right. The residuals of an old intraspinal canal lesion (ie: motor radiculopathy) at left C8 root or segment, mild in degree electrically.     ___________________________ Rommie Coats, MD     NCS+ Motor Nerve Results                 Latency Amplitude F-Lat Segment Distance CV Comment  Site (ms) Norm (mV) Norm (ms)   (cm) (m/s) Norm    Left Median (APB) Motor  Wrist *6.6  < 3.9 *2.1  > 6.0              Elbow 11.9 - 2.0 -   Elbow-Wrist 27 51  > 50    Right Median (APB) Motor  Wrist *4.0  < 3.9 *3.0  > 6.0  Left Ulnar (ADM) Motor  Wrist 1.78  < 3.1 10.3  > 7.0              Bel elbow 6.0 - 8.8 -   Bel elbow-Wrist 21 50  > 50    Ab elbow 8.0 - 8.6 -   Ab elbow-Bel elbow 10 50 -      Sensory Sites               Neg Peak Lat Amplitude (O-P) Segment Distance Velocity Comment  Site (ms) Norm (V) Norm   (cm) (ms)    Left Median Sensory  Wrist-Dig II *NR  < 3.4 *NR  > 20 Wrist-Dig II 13      Right Median Sensory  Wrist-Dig II *3.5  < 3.4 *15  > 20 Wrist-Dig II 13      Left  Radial Sensory  Forearm-Wrist 2.2  < 2.7 21  > 18 Forearm-Wrist 10      Left Ulnar Sensory  Wrist-Dig V 2.5  < 3.1 15  > 12 Wrist-Dig V 11      Right Ulnar Sensory  Wrist-Dig V 2.9  < 3.1 15  > 12 Wrist-Dig V 11        EMG+    Side Muscle Ins.Act Fibs Fasc Recrt Amp Dur Poly Activation Comment  Left FDI Nml Nml Nml *1- *1+ *1+ Nml Nml N/A  Left EIP Nml Nml Nml *1- *1+ *1+ Nml Nml N/A  Left Pronator teres Nml Nml Nml Nml Nml Nml Nml Nml N/A  Left APB Nml Nml Nml *SMU *1+ *1+ *1+ Nml N/A  Left Biceps Nml Nml Nml Nml Nml Nml Nml Nml N/A  Left Triceps Nml Nml Nml Nml Nml Nml Nml Nml N/A  Left Deltoid Nml Nml Nml Nml Nml Nml Nml Nml N/A    Nerve Measurements              Site Area Segment Area Ratio Mobility Vascularity Comment    mm Norm     Norm        Left Median  Wrist *20.0  < 13.0               Forearm 8.2  < 10.7  Wrist - Forearm *2.44  < 1.50         Right Median  Wrist *22.6  < 13.0               Forearm 9.4  < 10.7  Wrist - Forearm *2.40  < 1.50               Waveforms:   Motor                                       Sensory                                                                                                       1    I connected with  Louis Carney on 10/16/23 via telephone and verified that I am speaking with the correct person using two identifiers.  He was at his private residence, I was in the clinic.  We spoke for 10 minutes.   I discussed the limitations of evaluation and management by telemedicine. The patient expressed understanding and agreed to proceed.

## 2023-10-16 NOTE — Progress Notes (Signed)
 Called patient to discuss recent EMG results which show bilateral carpal tunnel syndrome, severe on the left and mild to moderate on the right.  The right upper extremity did previously have a release complete the patient stated that he had very little relief from.  Of note he is approximately 13 weeks status post C4-5 arthroplasty.  At this time left hand bothers him the most and is constantly with numbness and tingling.  He feels that this is affecting his strength and causing a lot of pain.  Carpal tunnel release was offered of his left upper extremity.  Plan for ultrasound-guided carpal tunnel release with the possibility of an open release.  Patient is in agreement.  All risk and benefits related to the procedure were discussed at length including damage to nerve, no improvement of symptoms, need for additional surgery, infection amongst others.      [facility]  [address] [place], [address]  [place], [place] [address] Tel: [phone] Fax: [phone] Test Date:  10/14/2023   Patient: Louis Carney DOB: 1981/02/18 Physician: Rommie Coats, MD  Sex: Male Height: 5' 8 Ref Phys: Arland Bellis  ID#: [id number]     Technician:      History: This is a 43 year old male with numbness and tingling in left hand.   NCV & EMG Findings: Extensive electrodiagnostic evaluation of the left upper limb with additional nerve conduction studies of the right upper limb shows: Left median sensory response is absent. Right median sensory response shows prolonged distal peak latency (3.5 ms) and reduced amplitude (15 V). Bilateral ulnar and left radial sensory responses are within normal limits. Bilateral median (APB) motor responses show prolonged distal onset latency (L6.6, R4.0 ms) and reduced amplitude (L2.1, R3.0 mV). Left ulnar (ADM) motor response is within normal limits. Chronic motor axon loss changes without accompanying active denervation changes are seen in the left first dorsal  interosseous, extensor indicis proprius, and abductor pollicis brevis muscles. Cervical paraspinal muscles were deferred due to prior cervical spine surgery.   Neuromuscular Ultrasound Findings: High frequency (4.0-16.0 MHz) B-mode, nonvascular ultrasound of the bilateral upper limbs shows: Cross sectional areas (CSA) of bilateral median (wrist to forearm) are increased at bilateral wrists (L 20.0 mm2, R 22.6 mm2). Wrist to forearm ratios of bilateral median nerves are increased (L 2.44, R 2.40). No other obvious lesion involving the adjacent bone or tendon is identified. No definite vascular abnormalities.   Impression: This is an abnormal study. The findings are most consistent with the following: Evidence of bilateral median mononeuropathy at or distal to the wrist, consistent with carpal tunnel syndrome, severe in degree electrically on the left and mild to moderate in degree electrically on the right. The residuals of an old intraspinal canal lesion (ie: motor radiculopathy) at left C8 root or segment, mild in degree electrically.     ___________________________ Rommie Coats, MD     NCS+ Motor Nerve Results                 Latency Amplitude F-Lat Segment Distance CV Comment  Site (ms) Norm (mV) Norm (ms)   (cm) (m/s) Norm    Left Median (APB) Motor  Wrist *6.6  < 3.9 *2.1  > 6.0              Elbow 11.9 - 2.0 -   Elbow-Wrist 27 51  > 50    Right Median (APB) Motor  Wrist *4.0  < 3.9 *3.0  > 6.0  Left Ulnar (ADM) Motor  Wrist 1.78  < 3.1 10.3  > 7.0              Bel elbow 6.0 - 8.8 -   Bel elbow-Wrist 21 50  > 50    Ab elbow 8.0 - 8.6 -   Ab elbow-Bel elbow 10 50 -      Sensory Sites               Neg Peak Lat Amplitude (O-P) Segment Distance Velocity Comment  Site (ms) Norm (V) Norm   (cm) (ms)    Left Median Sensory  Wrist-Dig II *NR  < 3.4 *NR  > 20 Wrist-Dig II 13      Right Median Sensory  Wrist-Dig II *3.5  < 3.4 *15  > 20 Wrist-Dig II 13      Left  Radial Sensory  Forearm-Wrist 2.2  < 2.7 21  > 18 Forearm-Wrist 10      Left Ulnar Sensory  Wrist-Dig V 2.5  < 3.1 15  > 12 Wrist-Dig V 11      Right Ulnar Sensory  Wrist-Dig V 2.9  < 3.1 15  > 12 Wrist-Dig V 11        EMG+    Side Muscle Ins.Act Fibs Fasc Recrt Amp Dur Poly Activation Comment  Left FDI Nml Nml Nml *1- *1+ *1+ Nml Nml N/A  Left EIP Nml Nml Nml *1- *1+ *1+ Nml Nml N/A  Left Pronator teres Nml Nml Nml Nml Nml Nml Nml Nml N/A  Left APB Nml Nml Nml *SMU *1+ *1+ *1+ Nml N/A  Left Biceps Nml Nml Nml Nml Nml Nml Nml Nml N/A  Left Triceps Nml Nml Nml Nml Nml Nml Nml Nml N/A  Left Deltoid Nml Nml Nml Nml Nml Nml Nml Nml N/A    Nerve Measurements              Site Area Segment Area Ratio Mobility Vascularity Comment    mm Norm     Norm        Left Median  Wrist *20.0  < 13.0               Forearm 8.2  < 10.7  Wrist - Forearm *2.44  < 1.50         Right Median  Wrist *22.6  < 13.0               Forearm 9.4  < 10.7  Wrist - Forearm *2.40  < 1.50               Waveforms:   Motor                                       Sensory                                                                                                       1    I connected with  [name] on 10/16/23 via telephone and verified that I am speaking with the correct person using two identifiers.  He was at his private residence, I was in the clinic.  We spoke for 10 minutes.   I discussed the limitations of evaluation and management by telemedicine. The patient expressed understanding and agreed to proceed.

## 2023-10-16 NOTE — Telephone Encounter (Signed)
 Please see below for information in regards to your upcoming surgery:   Planned surgery: Left Carpal Tunnel Release with Ultrasound with Potential Convert to Open    Surgery date: 10/31/2023 at [facility] (Medical Mall: [address], [place], [place] [address]) - you will find out your arrival time the business day before your surgery.   Pre-op appointment at [facility] Pre-admit Testing: you will receive a call with a date/time for this appointment. If you are scheduled for an in person appointment, Pre-admit Testing is located on the first floor of the [facility] building, [address], [address]. During this appointment, they will advise you which medications you can take the morning of surgery, and which medications you will need to hold for surgery. Labs (such as blood work, EKG) may be done at your pre-op appointment. You are not required to fast for these labs. Should you need to change your pre-op appointment, please call Pre-admit testing at [phone].    Diabetes/weight loss medications: Per anesthesia guidelines (due to the increased risk of aspiration caused by delayed gastric emptying):  GLP-1 medications:  Injectables should be held x 7 days  Semaglutide  (Ozempic )   Common restrictions after surgery: No bending, lifting, or twisting ("BLT"). Avoid lifting objects heavier than 10 pounds for the first 6 weeks after surgery. Do not use operative arm for 3 days.    How to contact us :  If you have any questions/concerns before or after surgery, you can reach us  at [phone], or you can send a mychart message. We can be reached by phone or mychart 8am-4pm, Monday-Friday.  *Please note: Calls after 4pm are forwarded to a third party answering service. Mychart messages are not routinely monitored during evenings, weekends, and holidays. Please call our office to contact the answering service for urgent concerns during non-business  hours.   Appointments/FMLA & disability paperwork: Gerlean Kocher, & Maryann Smalls Registered Nurses/Surgery schedulers: Kendelyn & Kelton Bultman Medical Assistants: Donnajean Fuse Physician Assistants: Ludwig Safer, PA-C, Anastacio Karvonen, PA-C & Lucetta Russel, PA-C Surgeons: Jodeen Munch, MD & Henderson Lock, MD   [facility] PREADMIT TESTING VISIT and SURGERY INFORMATION SHEET   Now that surgery has been scheduled you can anticipate several phone calls from [facility] services. A pharmacy technician will call you to verify your current list of medications taken at home.               The Pre-Service Center will call to verify your insurance information and to give you billing estimates and information.             The Preadmit Testing Office will be calling to schedule a visit to obtain information for the anesthesia team and provide instructions on preparation for surgery.  What can you expect for the Preadmit Testing Visit: Appointments may be scheduled in-person or by telephone.  If a telephone visit is scheduled, you may be asked to come into the office to have lab tests or other studies performed.   This visit will not be completed any greater than 14 days prior to your surgery.  If your surgery has been scheduled for a future date, please do not be alarmed if we have not contacted you to schedule an appointment more than a month prior to the surgery date.    Please be prepared to provide the following information during this appointment:            -Personal medical history                                               -  Medication and allergy  list            -Any history of problems with anesthesia              -Recent lab work or diagnostic studies            -Please notify us  of any needs we should be aware of to provide the best care possible           -You will be provided with instructions on how to prepare for your surgery.    On The Day of Surgery:  You must  have a driver to take you home after surgery, you will be asked not to drive for 24 hours following surgery.  Taxi, Baby [employer]olt and non-medical transport will not be acceptable means of transportation unless you have a responsible individual who will be traveling with you.  Visitors in the surgical area:   2 people will be able to visit you in your room once your preparation for surgery has been completed. During surgery, your visitors will be asked to wait in the Surgery Waiting Area.  It is not a requirement for them to stay, if they prefer to leave and come back.  Your visitor(s) will be given an update once the surgery has been completed.  No visitors are allowed in the initial recovery room to respect patient privacy and safety.  Once you are more awake and transfer to the secondary recovery area, or are transferred to an inpatient room, visitors will again be able to see you.  To respect and protect your privacy: We will ask on the day of surgery who your driver will be and what the contact number for that individual will be. We will ask if it is okay to share information with this individual, or if there is an alternative individual that we, or the surgeon, should contact to provide updates and information. If family or friends come to the surgical information desk requesting information about you, who you have not listed with us , no information will be given.   It may be helpful to designate someone as the main contact who will be responsible for updating your other friends and family.    PREADMIT TESTING OFFICE: [phone] SAME DAY SURGERY: [phone] We look forward to caring for you before and throughout the process of your surgery.

## 2023-10-16 NOTE — Telephone Encounter (Deleted)
 Please see below for information in regards to your upcoming surgery:   Planned surgery: Left Carpal Tunnel Release with Ultrasound with Potential Convert to Open    Surgery date: 10/31/2023 at [facility] (Medical Mall: [address], [place], [place] [address]) - you will find out your arrival time the business day before your surgery.   Pre-op appointment at [facility] Pre-admit Testing: you will receive a call with a date/time for this appointment. If you are scheduled for an in person appointment, Pre-admit Testing is located on the first floor of the [facility] building, [address], [address]. During this appointment, they will advise you which medications you can take the morning of surgery, and which medications you will need to hold for surgery. Labs (such as blood work, EKG) may be done at your pre-op appointment. You are not required to fast for these labs. Should you need to change your pre-op appointment, please call Pre-admit testing at [phone].    Diabetes/weight loss medications: Per anesthesia guidelines (due to the increased risk of aspiration caused by delayed gastric emptying):  GLP-1 medications:  Injectables should be held x 7 days  Semaglutide  (Ozempic )   Common restrictions after surgery: No bending, lifting, or twisting ("BLT"). Avoid lifting objects heavier than 10 pounds for the first 6 weeks after surgery. Do not use operative arm for 3 days.    How to contact us :  If you have any questions/concerns before or after surgery, you can reach us  at [phone], or you can send a mychart message. We can be reached by phone or mychart 8am-4pm, Monday-Friday.  *Please note: Calls after 4pm are forwarded to a third party answering service. Mychart messages are not routinely monitored during evenings, weekends, and holidays. Please call our office to contact the answering service for urgent concerns during non-business  hours.   Appointments/FMLA & disability paperwork: Gerlean Kocher, & Maryann Smalls Registered Nurses/Surgery schedulers: Kendelyn & Kelton Bultman Medical Assistants: Donnajean Fuse Physician Assistants: Ludwig Safer, PA-C, Anastacio Karvonen, PA-C & Lucetta Russel, PA-C Surgeons: Jodeen Munch, MD & Henderson Lock, MD   [facility] PREADMIT TESTING VISIT and SURGERY INFORMATION SHEET   Now that surgery has been scheduled you can anticipate several phone calls from [facility] services. A pharmacy technician will call you to verify your current list of medications taken at home.               The Pre-Service Center will call to verify your insurance information and to give you billing estimates and information.             The Preadmit Testing Office will be calling to schedule a visit to obtain information for the anesthesia team and provide instructions on preparation for surgery.  What can you expect for the Preadmit Testing Visit: Appointments may be scheduled in-person or by telephone.  If a telephone visit is scheduled, you may be asked to come into the office to have lab tests or other studies performed.   This visit will not be completed any greater than 14 days prior to your surgery.  If your surgery has been scheduled for a future date, please do not be alarmed if we have not contacted you to schedule an appointment more than a month prior to the surgery date.    Please be prepared to provide the following information during this appointment:            -Personal medical history                                               -  Medication and allergy  list            -Any history of problems with anesthesia              -Recent lab work or diagnostic studies            -Please notify us  of any needs we should be aware of to provide the best care possible           -You will be provided with instructions on how to prepare for your surgery.    On The Day of Surgery:  You must  have a driver to take you home after surgery, you will be asked not to drive for 24 hours following surgery.  Taxi, Baby [employer]olt and non-medical transport will not be acceptable means of transportation unless you have a responsible individual who will be traveling with you.  Visitors in the surgical area:   2 people will be able to visit you in your room once your preparation for surgery has been completed. During surgery, your visitors will be asked to wait in the Surgery Waiting Area.  It is not a requirement for them to stay, if they prefer to leave and come back.  Your visitor(s) will be given an update once the surgery has been completed.  No visitors are allowed in the initial recovery room to respect patient privacy and safety.  Once you are more awake and transfer to the secondary recovery area, or are transferred to an inpatient room, visitors will again be able to see you.  To respect and protect your privacy: We will ask on the day of surgery who your driver will be and what the contact number for that individual will be. We will ask if it is okay to share information with this individual, or if there is an alternative individual that we, or the surgeon, should contact to provide updates and information. If family or friends come to the surgical information desk requesting information about you, who you have not listed with us , no information will be given.   It may be helpful to designate someone as the main contact who will be responsible for updating your other friends and family.    PREADMIT TESTING OFFICE: [phone] SAME DAY SURGERY: [phone] We look forward to caring for you before and throughout the process of your surgery.

## 2023-10-21 ENCOUNTER — Encounter: Payer: Self-pay | Admitting: Dermatology

## 2023-10-24 ENCOUNTER — Encounter
Admission: RE | Admit: 2023-10-24 | Discharge: 2023-10-24 | Disposition: A | Discharge status: Home / Self Care | Source: Ambulatory Visit | Attending: Neurosurgery | Admitting: Neurosurgery

## 2023-10-24 ENCOUNTER — Other Ambulatory Visit: Payer: Self-pay

## 2023-10-24 DIAGNOSIS — I10 Essential (primary) hypertension: Secondary | ICD-10-CM | POA: Diagnosis not present

## 2023-10-24 DIAGNOSIS — Z01812 Encounter for preprocedural laboratory examination: Secondary | ICD-10-CM | POA: Insufficient documentation

## 2023-10-24 DIAGNOSIS — E119 Type 2 diabetes mellitus without complications: Secondary | ICD-10-CM | POA: Insufficient documentation

## 2023-10-24 LAB — BASIC METABOLIC PANEL WITH GFR
Anion gap: 10 (ref 5–15)
BUN: 14 mg/dL (ref 6–20)
CO2: 26 mmol/L (ref 22–32)
Calcium: 9.2 mg/dL (ref 8.9–10.3)
Chloride: 99 mmol/L (ref 98–111)
Creatinine, Ser: 0.6 mg/dL — ABNORMAL LOW (ref 0.61–1.24)
GFR, Estimated: >60 mL/min (ref >60)
Glucose, Bld: 157 mg/dL — ABNORMAL HIGH (ref 70–99)
Potassium: 4 mmol/L (ref 3.5–5.1)
Sodium: 135 mmol/L (ref 135–145)

## 2023-10-24 LAB — CBC
HCT: 44.9 % (ref 39.0–52.0)
Hemoglobin: 16 g/dL (ref 13.0–17.0)
MCH: 30.9 pg (ref 26.0–34.0)
MCHC: 35.6 g/dL (ref 30.0–36.0)
MCV: 86.7 fL (ref 80.0–100.0)
Platelets: 299 10*3/uL (ref 150–400)
RBC: 5.18 MIL/uL (ref 4.22–5.81)
RDW: 12 % (ref 11.5–15.5)
WBC: 7.8 10*3/uL (ref 4.0–10.5)
nRBC: 0 % (ref 0.0–0.2)

## 2023-10-24 NOTE — Patient Instructions (Addendum)
 Your procedure is scheduled on: 10/31/23 - Thursday Report to the Registration Desk on the 1st floor of the [facility]. To find out your arrival time, please call [phone] between 1PM - 3PM on: 10/30/23 - Wednesday If your arrival time is 6:00 am, do not arrive before that time as the [facility] entrance doors do not open until 6:00 am.  REMEMBER: Instructions that are not followed completely may result in serious medical risk, up to and including death; or upon the discretion of your surgeon and anesthesiologist your surgery may need to be rescheduled.  Do not eat food after midnight the night before surgery.  No gum chewing or hard candies.  You may however, drink CLEAR liquids up to 2 hours before you are scheduled to arrive for your surgery. Do not drink anything within 2 hours of your scheduled arrival time.  Clear liquids include: - water   One week prior to surgery: Stop Anti-inflammatories (NSAIDS) such as Advil, Aleve, Ibuprofen, Motrin, Naproxen, Naprosyn and Aspirin based products such as Excedrin, Goody's Powder, BC Powder. You may continue to take Tylenol  if needed for pain up until the day of surgery.  Stop ANY OVER THE COUNTER supplements until after surgery.  Hold: Ozempic  7 days prior to surgery   HOLD : sildenafil  (VIAGRA ) 2 days prior to surgery  ON THE DAY OF SURGERY ONLY TAKE THESE MEDICATIONS WITH SIPS OF WATER:  amLODipine  (NORVASC )  celecoxib  (CELEBREX ) if needed esomeprazole (NEXIUM)  rosuvastatin  (CRESTOR )  gabapentin  (NEURONTIN ) if needed   No Alcohol for 24 hours before or after surgery.  No Smoking including e-cigarettes for 24 hours before surgery.  No chewable tobacco products for at least 6 hours before surgery.  No nicotine patches on the day of surgery.  Do not use any recreational drugs for at least a week (preferably 2 weeks) before your surgery.  Please be advised that the combination of cocaine and anesthesia may have negative  outcomes, up to and including death. If you test positive for cocaine, your surgery will be cancelled.  On the morning of surgery brush your teeth with toothpaste and water, you may rinse your mouth with mouthwash if you wish. Do not swallow any toothpaste or mouthwash.  Use CHG Soap or wipes as directed on instruction sheet.  Do not wear jewelry, make-up, hairpins, clips or nail polish.  For welded (permanent) jewelry: bracelets, anklets, waist bands, etc.  Please have this removed prior to surgery.  If it is not removed, there is a chance that hospital personnel will need to cut it off on the day of surgery.  Do not wear lotions, powders, or perfumes.   Do not shave body hair from the neck down 48 hours before surgery.  Contact lenses, hearing aids and dentures may not be worn into surgery.  Do not bring valuables to the hospital. [facility] is not responsible for any missing/lost belongings or valuables.   Notify your doctor if there is any change in your medical condition (cold, fever, infection).  Wear comfortable clothing (specific to your surgery type) to the hospital.  After surgery, you can help prevent lung complications by doing breathing exercises.  Take deep breaths and cough every 1-2 hours. Your doctor may order a device called an Incentive Spirometer to help you take deep breaths.  When coughing or sneezing, hold a pillow firmly against your incision with both hands. This is called "splinting." Doing this helps protect your incision. It also decreases belly discomfort.  If you are being admitted to the hospital overnight, leave your suitcase in the car. After surgery it may be brought to your room.  In case of increased patient census, it may be necessary for you, the patient, to continue your postoperative care in the Same Day Surgery department.  If you are being discharged the day of surgery, you will not be allowed to drive home. You will need a responsible  individual to drive you home and stay with you for 24 hours after surgery.   If you are taking public transportation, you will need to have a responsible individual with you.  Please call the Pre-admissions Testing Dept. at [phone] if you have any questions about these instructions.  Surgery Visitation Policy:  Patients having surgery or a procedure may have two visitors.  Children under the age of 8 must have an adult with them who is not the patient.  Inpatient Visitation:    Visiting hours are 7 a.m. to 8 p.m. Up to four visitors are allowed at one time in a patient room. The visitors may rotate out with other people during the day.  One visitor age 87 or older may stay with the patient overnight and must be in the room by 8 p.m.   Merchandiser, retail to address health-related social needs:  [email or web address]     Preparing for Surgery with CHLORHEXIDINE  GLUCONATE (CHG) Soap  Chlorhexidine  Gluconate (CHG) Soap  o An antiseptic cleaner that kills germs and bonds with the skin to continue killing germs even after washing  o Used for showering the night before surgery and morning of surgery  Before surgery, you can play an important role by reducing the number of germs on your skin.  CHG (Chlorhexidine  gluconate) soap is an antiseptic cleanser which kills germs and bonds with the skin to continue killing germs even after washing.  Please do not use if you have an allergy  to CHG or antibacterial soaps. If your skin becomes reddened/irritated stop using the CHG.  1. Shower the NIGHT BEFORE SURGERY and the MORNING OF SURGERY with CHG soap.  2. If you choose to wash your hair, wash your hair first as usual with your normal shampoo.  3. After shampooing, rinse your hair and body thoroughly to remove the shampoo.  4. Use CHG as you would any other liquid soap. You can apply CHG directly to the skin and wash gently with a scrungie or a clean  washcloth.  5. Apply the CHG soap to your body only from the neck down. Do not use on open wounds or open sores. Avoid contact with your eyes, ears, mouth, and genitals (private parts). Wash face and genitals (private parts) with your normal soap.  6. Wash thoroughly, paying special attention to the area where your surgery will be performed.  7. Thoroughly rinse your body with warm water.  8. Do not shower/wash with your normal soap after using and rinsing off the CHG soap.  9. Pat yourself dry with a clean towel.  10. Wear clean pajamas to bed the night before surgery.  12. Place clean sheets on your bed the night of your first shower and do not sleep with pets.  13. Shower again with the CHG soap on the day of surgery prior to arriving at the hospital.  14. Do not apply any deodorants/lotions/powders.  15. Please wear clean clothes to the hospital.

## 2023-10-30 MED ORDER — SODIUM CHLORIDE 0.9 % IV SOLN: INTRAVENOUS | Status: DC

## 2023-10-30 MED ORDER — CEFAZOLIN IN SODIUM CHLORIDE 2-0.9 GM/100ML-% IV SOLN
2.0000 g | Freq: Once | INTRAVENOUS | Status: DC
  Filled 2023-10-30: qty 100

## 2023-10-30 MED ORDER — ORAL CARE MOUTH RINSE: 15.0000 mL | Freq: Once | OROMUCOSAL | Status: AC

## 2023-10-30 MED ORDER — CEFAZOLIN SODIUM-DEXTROSE 2-4 GM/100ML-% IV SOLN
2.0000 g | INTRAVENOUS | Status: AC
  Administered 2023-10-31: 2 g via INTRAVENOUS

## 2023-10-30 MED ORDER — CHLORHEXIDINE GLUCONATE 0.12 % MT SOLN
15.0000 mL | Freq: Once | OROMUCOSAL | Status: AC
  Administered 2023-10-31: 15 mL via OROMUCOSAL

## 2023-10-31 ENCOUNTER — Ambulatory Visit
Admission: RE | Admit: 2023-10-31 | Discharge: 2023-10-31 | Disposition: A | Discharge status: Home / Self Care | Attending: Neurosurgery | Admitting: Neurosurgery

## 2023-10-31 ENCOUNTER — Other Ambulatory Visit: Payer: Self-pay

## 2023-10-31 ENCOUNTER — Ambulatory Visit: Payer: Self-pay | Admitting: Urgent Care

## 2023-10-31 ENCOUNTER — Encounter: Payer: Self-pay | Admitting: Neurosurgery

## 2023-10-31 ENCOUNTER — Encounter
Admission: RE | Disposition: A | Discharge status: Home / Self Care | Payer: Self-pay | Source: Home / Self Care | Attending: Neurosurgery

## 2023-10-31 ENCOUNTER — Encounter: Payer: Self-pay | Admitting: Anesthesiology

## 2023-10-31 DIAGNOSIS — F17210 Nicotine dependence, cigarettes, uncomplicated: Secondary | ICD-10-CM | POA: Diagnosis not present

## 2023-10-31 DIAGNOSIS — E119 Type 2 diabetes mellitus without complications: Secondary | ICD-10-CM | POA: Insufficient documentation

## 2023-10-31 DIAGNOSIS — G5602 Carpal tunnel syndrome, left upper limb: Secondary | ICD-10-CM | POA: Diagnosis present

## 2023-10-31 DIAGNOSIS — Z01818 Encounter for other preprocedural examination: Secondary | ICD-10-CM

## 2023-10-31 DIAGNOSIS — F17200 Nicotine dependence, unspecified, uncomplicated: Secondary | ICD-10-CM | POA: Diagnosis not present

## 2023-10-31 DIAGNOSIS — I10 Essential (primary) hypertension: Secondary | ICD-10-CM | POA: Insufficient documentation

## 2023-10-31 HISTORY — PX: CARPAL TUNNEL RELEASE: SHX101

## 2023-10-31 LAB — GLUCOSE, CAPILLARY
Glucose-Capillary: 160 mg/dL — ABNORMAL HIGH (ref 70–99)
Glucose-Capillary: 152 mg/dL — ABNORMAL HIGH (ref 70–99)

## 2023-10-31 SURGERY — CARPAL TUNNEL RELEASE
Anesthesia: General | Laterality: Left

## 2023-10-31 MED ORDER — CHLORHEXIDINE GLUCONATE 0.12 % MT SOLN
OROMUCOSAL | Status: AC
  Filled 2023-10-31: qty 15

## 2023-10-31 MED ORDER — PROPOFOL 500 MG/50ML IV EMUL
INTRAVENOUS | Status: DC | PRN
  Administered 2023-10-31: 140 ug/kg/min via INTRAVENOUS

## 2023-10-31 MED ORDER — FENTANYL CITRATE (PF) 100 MCG/2ML IJ SOLN
INTRAMUSCULAR | Status: AC
  Filled 2023-10-31: qty 2

## 2023-10-31 MED ORDER — CEFAZOLIN SODIUM-DEXTROSE 2-4 GM/100ML-% IV SOLN
INTRAVENOUS | Status: AC
  Filled 2023-10-31: qty 100

## 2023-10-31 MED ORDER — LIDOCAINE HCL 1 % IJ SOLN
INTRAMUSCULAR | Status: DC | PRN
  Administered 2023-10-31: 6 mL

## 2023-10-31 MED ORDER — MIDAZOLAM HCL 2 MG/2ML IJ SOLN
INTRAMUSCULAR | Status: DC | PRN
  Administered 2023-10-31: 2 mg via INTRAVENOUS

## 2023-10-31 MED ORDER — MIDAZOLAM HCL 2 MG/2ML IJ SOLN
INTRAMUSCULAR | Status: AC
  Filled 2023-10-31: qty 2

## 2023-10-31 MED ORDER — DEXMEDETOMIDINE HCL IN NACL 80 MCG/20ML IV SOLN
INTRAVENOUS | Status: DC | PRN
  Administered 2023-10-31 (×2): 4 ug via INTRAVENOUS

## 2023-10-31 MED ORDER — LIDOCAINE HCL (PF) 1 % IJ SOLN
INTRAMUSCULAR | Status: AC
  Filled 2023-10-31: qty 60

## 2023-10-31 MED ORDER — BUPIVACAINE HCL (PF) 0.5 % IJ SOLN
INTRAMUSCULAR | Status: AC
Start: 2023-10-31 — End: 2023-10-31
  Filled 2023-10-31: qty 30

## 2023-10-31 MED ORDER — 0.9 % SODIUM CHLORIDE (POUR BTL) OPTIME
TOPICAL | Status: DC | PRN
  Administered 2023-10-31: 100 mL

## 2023-10-31 MED ORDER — PROPOFOL 1000 MG/100ML IV EMUL
INTRAVENOUS | Status: AC
Start: 2023-10-31 — End: 2023-10-31
  Filled 2023-10-31: qty 100

## 2023-10-31 MED ORDER — FENTANYL CITRATE (PF) 100 MCG/2ML IJ SOLN
INTRAMUSCULAR | Status: DC | PRN
  Administered 2023-10-31: 50 ug via INTRAVENOUS

## 2023-10-31 MED ORDER — HYDROCODONE-ACETAMINOPHEN 5-325 MG PO TABS: 1.0000 | ORAL_TABLET | Freq: Four times a day (QID) | ORAL | 0 refills | Status: AC | PRN

## 2023-10-31 MED ORDER — PROPOFOL 10 MG/ML IV BOLUS
INTRAVENOUS | Status: DC | PRN
  Administered 2023-10-31: 80 mg via INTRAVENOUS

## 2023-10-31 MED ORDER — DEXMEDETOMIDINE HCL IN NACL 80 MCG/20ML IV SOLN
INTRAVENOUS | Status: AC
  Filled 2023-10-31: qty 20

## 2023-10-31 SURGICAL SUPPLY — 21 items
BNDG ADH 1X3 SHEER STRL LF (GAUZE/BANDAGES/DRESSINGS) ×1 IMPLANT
BNDG ADH 2 X3.75 FABRIC TAN LF (GAUZE/BANDAGES/DRESSINGS) IMPLANT
BRUSH SCRUB EZ 4% CHG (MISCELLANEOUS) ×1 IMPLANT
CHLORAPREP W/TINT 26 (MISCELLANEOUS) ×1 IMPLANT
COVER PROBE FLX POLY STRL (MISCELLANEOUS) ×1 IMPLANT
DERMABOND ADVANCED .7 DNX12 (GAUZE/BANDAGES/DRESSINGS) ×1 IMPLANT
DRAPE EXTREMITY 106X87X128.5 (DRAPES) ×1 IMPLANT
DRAPE IMP U-DRAPE 54X76 (DRAPES) ×1 IMPLANT
DRAPE SHEET LG 3/4 BI-LAMINATE (DRAPES) ×1 IMPLANT
GLOVE BIOGEL PI IND STRL 8 (GLOVE) ×1 IMPLANT
GLOVE SRG 8 PF TXTR STRL LF DI (GLOVE) ×1 IMPLANT
GLOVE SURG SYN 7.5 E (GLOVE) ×1 IMPLANT
GLOVE SURG SYN 7.5 PF PI (GLOVE) ×1 IMPLANT
GOWN SRG XL LVL 3 NONREINFORCE (GOWNS) ×1 IMPLANT
KIT TURNOVER KIT A (KITS) ×1 IMPLANT
NDL HYPO 25X1 1.5 SAFETY (NEEDLE) ×1 IMPLANT
NEEDLE HYPO 25X1 1.5 SAFETY (NEEDLE) ×1 IMPLANT
NS IRRIG 500ML POUR BTL (IV SOLUTION) ×1 IMPLANT
PACK BASIN MINOR ARMC (MISCELLANEOUS) ×1 IMPLANT
STOCKINETTE IMPERVIOUS 9X36 MD (GAUZE/BANDAGES/DRESSINGS) ×1 IMPLANT
ULTRAGLIDE CTR (BLADE) ×1 IMPLANT

## 2023-10-31 NOTE — Interval H&P Note (Signed)
 History and Physical Interval Note:  10/31/2023 7:07 AM  Louis Carney  has presented today for surgery, with the diagnosis of G56.02 Carpal tunnel syndrome of left wrist.  The various methods of treatment have been discussed with the patient and family. After consideration of risks, benefits and other options for treatment, the patient has consented to  Procedure(s) with comments: CARPAL TUNNEL RELEASE (Left) - LEFT CARPAL TUNNEL RELEASE WITH POSSIBLE CONVERT TO OPEN as a surgical intervention.  The patient's history has been reviewed, patient examined, no change in status, stable for surgery.  I have reviewed the patient's chart and labs.  Questions were answered to the patient's satisfaction.    Heart and lungs clear  Louis Carney

## 2023-10-31 NOTE — Anesthesia Postprocedure Evaluation (Signed)
 Anesthesia Post Note  Patient: Louis Carney  Procedure(s) Performed: CARPAL TUNNEL RELEASE (Left)  Patient location during evaluation: PACU Anesthesia Type: General Level of consciousness: awake and alert Pain management: pain level controlled Vital Signs Assessment: post-procedure vital signs reviewed and stable Respiratory status: spontaneous breathing, nonlabored ventilation and respiratory function stable Cardiovascular status: blood pressure returned to baseline and stable Postop Assessment: no apparent nausea or vomiting Anesthetic complications: no   No notable events documented.   Last Vitals:  Vitals:   10/31/23 0815 10/31/23 0826  BP: (!) 140/98 (!) 141/105  Pulse: 78 89  Resp: 12 15  Temp:  (!) 36.4 C  SpO2: 97% 99%    Last Pain:  Vitals:   10/31/23 0826  TempSrc: Temporal  PainSc: 0-No pain                 Camellia Merilee Louder

## 2023-10-31 NOTE — Anesthesia Preprocedure Evaluation (Signed)
 Anesthesia Evaluation  Patient identified by MRN, date of birth, ID band Patient awake    Reviewed: Allergy  & Precautions, H&P , NPO status , Patient's Chart, lab work & pertinent test results  Airway Mallampati: III  TM Distance: >3 FB Neck ROM: full    Dental no notable dental hx.    Pulmonary Current SmokerPatient did not abstain from smoking.   Pulmonary exam normal        Cardiovascular Exercise Tolerance: Good hypertension, Pt. on medications Normal cardiovascular exam     Neuro/Psych  PSYCHIATRIC DISORDERS       Neuromuscular disease    GI/Hepatic Neg liver ROS,GERD  ,,  Endo/Other  diabetes, Type 2    Renal/GU negative Renal ROS  negative genitourinary   Musculoskeletal   Abdominal   Peds  Hematology negative hematology ROS (+)   Anesthesia Other Findings Past Medical History: No date: Alcohol abuse No date: Arthritis 12/2017: Carpal tunnel syndrome of right wrist No date: Cervical disc herniation     Comment:  C4-5 No date: Cervical myelopathy (HCC) No date: Chronic sinusitis No date: GERD (gastroesophageal reflux disease) No date: Headache     Comment:  migraines 02/2018: HNP (herniated nucleus pulposus), lumbar No date: Hyperlipidemia, mixed No date: Hypertension associated with diabetes (HCC) No date: Pneumonia No date: Psoriasis     Comment:  since age 23 years 11/06/2017: TB lung, latent 01/29/2018: Transaminitis No date: Tuberculosis     Comment:  latent TB infection on treatment No date: Type 2 diabetes mellitus with hyperglycemia (HCC)  Past Surgical History: 01/15/2018: CARPAL TUNNEL RELEASE; Right     Comment:  Procedure: Right Carpal tunnel release;  Surgeon:               Gillie Duncans, MD;  Location: [facility] OR;  Service:               Neurosurgery;  Laterality: Right;  Right Carpal tunnel               release 07/11/2023: CERVICAL DISC ARTHROPLASTY; N/A     Comment:  Procedure:  C4-5 ARTHROPLASTY;  Surgeon: Claudene Penne ORN, MD;  Location: [facility] ORS;  Service: Neurosurgery;                Laterality: N/A; No date: CERVICAL SPINE SURGERY 03/07/2018: LUMBAR LAMINECTOMY/DECOMPRESSION MICRODISCECTOMY; Right     Comment:  Procedure: Right Lumbar 5 Sacral 1 Microdiscectomy;                Surgeon: Gillie Duncans, MD;  Location: [facility] OR;  Service:               Neurosurgery;  Laterality: Right;  Right Lumbar 5 Sacral               1 Microdiscectomy     Reproductive/Obstetrics negative OB ROS                              Anesthesia Physical Anesthesia Plan  ASA: 3  Anesthesia Plan: General   Post-op Pain Management: Minimal or no pain anticipated   Induction: Intravenous  PONV Risk Score and Plan: Propofol  infusion and TIVA  Airway Management Planned: Natural Airway  Additional Equipment:   Intra-op Plan:   Post-operative Plan:   Informed Consent: I have reviewed the patients History and Physical, chart, labs and discussed the procedure  including the risks, benefits and alternatives for the proposed anesthesia with the patient or authorized representative who has indicated his/her understanding and acceptance.     Dental Advisory Given  Plan Discussed with: CRNA and Surgeon  Anesthesia Plan Comments:          Anesthesia Quick Evaluation

## 2023-10-31 NOTE — Transfer of Care (Signed)
 Immediate Anesthesia Transfer of Care Note  Patient: Louis Carney  Procedure(s) Performed: CARPAL TUNNEL RELEASE (Left)  Patient Location: PACU  Anesthesia Type:General  Level of Consciousness: awake, alert , and oriented  Airway & Oxygen Therapy: Patient Spontanous Breathing  Post-op Assessment: Report given to RN and Post -op Vital signs reviewed and stable  Post vital signs: Reviewed and stable  Last Vitals:  Vitals Value Taken Time  BP 118/68 10/31/23 07:53  Temp    Pulse 83 10/31/23 07:56  Resp 10 10/31/23 07:56  SpO2 96 % 10/31/23 07:56  Vitals shown include unfiled device data.  Last Pain:  Vitals:   10/31/23 0619  TempSrc: Temporal  PainSc: 6          Complications: No notable events documented.

## 2023-10-31 NOTE — Discharge Instructions (Signed)
 Your surgeon has performed an operation on the nerves in your upper extremity. Many times, patients feel better immediately after surgery and can "overdo it." Even if you feel well, it is important that you follow these activity guidelines. If you do not let your nerve heal properly from the surgery, you can increase the chance of return of your symptoms. The following are instructions to help in your recovery once you have been discharged from the hospital.  It is normal for your nerves to feel increased "tingling" after surgery or a sensation that the nerve is "waking up".  This generally will resolve in a short period of time, within 1 to 2 weeks.  * It is ok to take NSAIDs after surgery.  Activity    Increase physical activity slowly as tolerated.  Taking short walks is encouraged, but avoid strenuous exercise. Do not jog, run, bicycle, lift weights, or participate in any other exercises unless specifically allowed by your doctor. Avoid prolonged sitting, including car rides.  For the first few days after surgery, avoid any use of the extremity more than a glass of water or for eating.  Specific instructions were given to you based off of your specific procedure.  You should not drive until no longer taking any new pain medication.  You may shower three days after your surgery.  After showering, lightly dab your incision dry. Do not take a tub bath or go swimming for 3 weeks, or until approved by your doctor at your follow-up appointment.  If you smoke, we strongly recommend that you quit.  Smoking has been proven to interfere with normal healing in your back and will dramatically reduce the success rate of your surgery. Please contact [facility] (800-QUIT-NOW) and use the resources at www.[facility].com for assistance in stopping smoking.  Surgical Incision   If you have a dressing on your incision, you may remove it three days after your surgery. Keep your incision area clean and dry.  Your  sutures are under your skin and your wound is covered with surgical glue.  The glue should begin to peel away within about a week.   Diet            You may return to your usual diet. Be sure to stay hydrated.  When to Contact us  Although your surgery and recovery will likely be uneventful, you may have some residual numbness, aches, and pains in your back and/or legs. This is normal and should improve in the next few weeks.  However, should you experience any of the following, contact us immediately: New numbness or weakness Pain that is progressively getting worse, and is not relieved by your pain medications or rest Bleeding, redness, swelling, pain, or drainage from surgical incision Chills or flu-like symptoms Fever greater than 101.0 F (38.3 C) Problems with bowel or bladder functions Difficulty breathing or shortness of breath Warmth, tenderness, or swelling in your calf  Contact Information How to contact us:  If you have any questions/concerns before or after surgery, you can reach [place] at [phone], or you can send a mychart message. We can be reached by phone or mychart 8am-4pm, Monday-Friday.  *Please note: Calls after 4pm are forwarded to a third party answering service. Mychart messages are not routinely monitored during evenings, weekends, and holidays. Please call our office to contact the answering service for urgent concerns during non-business hours.

## 2023-10-31 NOTE — Op Note (Signed)
 Indications: Patient with a history of median neuropathy at the wrist with hand weakness refractory to conservative management.  Findings: Severe compression of the median nerve at the transverse carpal ligament  Preoperative Diagnosis:G56.02 Carpal tunnel syndrome of left wrist  Postoperative Diagnosis: G56.02 Carpal tunnel syndrome of left wrist   Postoperative Diagnosis: same   EBL: Minimal IVF: See anesthesia report Drains: none Disposition:Stable to PACU Complications: none  No foley catheter was placed.   Preoperative Note: patient with a history of progressive left median neuropathy with hand weakness refractory to conservative management.  They had tried rest, padding, and watchful waiting but had continued progressive symptoms.  Given the progression of her median neuropathy plan was made for median nerve decompression  Risk of surgery is discussed and include: Infection, bleeding, wound healing issues, pillar pain nerve injury, pain, failure to relieve the symptoms, need for further surgery.  Procedure:  1) left sided carpal tunnel decompression with ultrasound guidance   Procedure: After obtaining informed consent, the patient taken to the operating room, placed in supine position, monitored anesthesia care was induced.  They were given preoperative antibiotics.  Prepped and draped in the usual fashion.  Comprehensive timeout was performed verifying the patient's name, MRN, planned procedure.  An ultrasound was used with a sterile probe.  We used this to mark out our safety points including the interval between the ulnar artery and median nerve.  We identified the motor branch as well as the first sensory branch which were both in safe position.  We also identified the vascular arcade which was in safe positioning as well.  At this point we placed a block with Marcaine  without epinephrine .  We blocked the skin where the incision would be, the superficial sensory median  nerve, as well as the transverse carpal ligament.  Under ultrasound guidance we utilized the local anesthetic to perform a hydrodissection of the nerve from the transverse carpal ligament.  We then prepped the sonexs ultra CTR knife on the back table while the anesthetic set in.  We performed a small linear incision approximately 2 to 3 mm.  We then utilized a Statistician under ultrasound guidance to identify the underside of the transverse carpal ligament.  The fat and connective tissue was dissected off the underside.  We could feel that this was quite thickened and calcified.  Causing severe compression.  Once we had a clear tract we then placed the ultrasound-guided knife into the incision and advanced it through the carpal tunnel.  We verified the safety zones including the median nerve which did not significantly cross over the knife.  We are able to see the first sensory branch which was not crossing the knife.  The artery was also in a safe place.  At this point we divided the transverse carpal ligament under ultrasound guidance, to get a full release it took 2 passes.  The nerve relaxed laterally and was well decompressed.  After the 2 passes we placed a [place] 4 into the wound and were able to feel a complete dissection of the transverse carpal ligament.  We then irrigated, we got meticulous hemostasis.  Skin glue was placed on the incision and a Band-Aid was placed on top once this was dried.  No immediate complications.  Sponge and pattie counts were correct at the end of the procedure.   I performed the procedure without an assistant surgeon  Penne MICAEL Sharps, MD/MSCR

## 2023-11-01 ENCOUNTER — Encounter: Payer: Self-pay | Admitting: Neurosurgery

## 2023-11-13 ENCOUNTER — Ambulatory Visit (INDEPENDENT_AMBULATORY_CARE_PROVIDER_SITE_OTHER): Admitting: Physician Assistant

## 2023-11-13 ENCOUNTER — Other Ambulatory Visit: Payer: Self-pay | Admitting: Nurse Practitioner

## 2023-11-13 ENCOUNTER — Encounter: Payer: Self-pay | Admitting: Physician Assistant

## 2023-11-13 VITALS — BP 118/82 | Temp 98.2°F | Ht 68.0 in | Wt 210.0 lb

## 2023-11-13 DIAGNOSIS — G8929 Other chronic pain: Secondary | ICD-10-CM

## 2023-11-13 DIAGNOSIS — Z09 Encounter for follow-up examination after completed treatment for conditions other than malignant neoplasm: Secondary | ICD-10-CM

## 2023-11-13 DIAGNOSIS — G5602 Carpal tunnel syndrome, left upper limb: Secondary | ICD-10-CM

## 2023-11-13 DIAGNOSIS — Z9889 Other specified postprocedural states: Secondary | ICD-10-CM

## 2023-11-13 MED ORDER — PREGABALIN 25 MG PO CAPS: 25.0000 mg | ORAL_CAPSULE | Freq: Two times a day (BID) | ORAL | 1 refills | Status: DC

## 2023-11-13 NOTE — Progress Notes (Signed)
   REFERRING PHYSICIAN:  Chandra Toribio POUR, Md [address] [place],  [place] [address]  DOS:  C4-5 arthroplasty, 07/11/2023  Left ultrasound-guided carpal tunnel release, 10/31/2023  HISTORY OF PRESENT ILLNESS: Louis Carney is 4 months status post C4-5 arthroplasty and 2 weeks status post left ultrasound-guided carpal tunnel release.  He states he is continuing to have numbness and tingling in both of his arms that increases when he is trying to work at his desk and leaning forward.  He has not had any improvement in numbness and tingling in his arms at this time.  He also continues to complain of back pain.    PHYSICAL EXAMINATION:  NEUROLOGICAL:  General: In no acute distress.   Awake, alert, oriented to person, place, and time.  Pupils equal round and reactive to light.  Facial tone is symmetric.    Strength: Strength: Side Biceps Triceps Deltoid Interossei Grip Wrist Ext. Wrist Flex.  R 5 4+ 4+ 4+ 4+ 4+ 5  L 5 5 5 4 4 5  4-    Incision c/d/I  Imaging:  No recent imaging  Assessment / Plan: Louis Carney is 4 months status post C4-5 arthroplasty and 2 weeks status post left ultrasound-guided carpal tunnel release.  He states he is continuing to have numbness and tingling in both of his arms that increases when he is trying to work at his desk and leaning forward.  He has not had any improvement in numbness and tingling in his arms at this time.  He also continues to complain of back pain.We discussed activity escalation and I have advised the patient to lift up to 10 pounds until 6 weeks after surgery, then increase up to 25 pounds until 12 weeks after surgery.  After 12 weeks post-op, the patient advised to increase activity as tolerated. he will return to clinic in approximately 1 month for his 6-week postop visit.  Decided to try Lyrica  instead of the gabapentin  for patient's neuropathic pain.  Also discussed augmentation of this to his work set up in order to decrease numbness  and tingling in his arms due to positioning.  In addition, advised patient to go ahead and start physical therapy for his lower back.  Will revisit this issue in his coming appointments.  Advised to contact the office if any questions or concerns arise.   Lyle Decamp PA-C Dept of Neurosurgery

## 2023-11-27 ENCOUNTER — Ambulatory Visit: Admitting: Dermatology

## 2023-11-27 DIAGNOSIS — Z7189 Other specified counseling: Secondary | ICD-10-CM

## 2023-11-27 DIAGNOSIS — Z79899 Other long term (current) drug therapy: Secondary | ICD-10-CM | POA: Diagnosis not present

## 2023-11-27 DIAGNOSIS — L409 Psoriasis, unspecified: Secondary | ICD-10-CM

## 2023-11-27 MED ORDER — TRIAMCINOLONE ACETONIDE 10 MG/ML IJ SUSP
10.0000 mg | Freq: Once | INTRAMUSCULAR | Status: AC
  Administered 2023-11-27: 10 mg

## 2023-11-27 NOTE — Patient Instructions (Signed)

## 2023-11-27 NOTE — Progress Notes (Unsigned)
   Follow-Up Visit   Subjective  Louis Carney is a 43 y.o. 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   The following portions of the chart were reviewed this encounter and updated as appropriate: medications, allergies, medical history  Review of Systems:  No other skin or systemic complaints except as noted in HPI or Assessment and Plan.  Objective  Well appearing patient in no apparent distress; mood and affect are within normal limits.  A focused examination was performed of the following areas: the back and sacral area  Relevant exam findings are noted in the Assessment and Plan.  Sacral area -patient currently on systemic Skyrizi  injections but this area in the sacral area biopsy-proven verrucous psoriasis is persistent and we will try to treat and improved with IL K today. Verrucous plaque of the sacral area  Assessment & Plan   PSORIASIS Sacral area -patient currently on systemic Skyrizi  injections but this area in the sacral area biopsy-proven verrucous psoriasis is persistent and we will try to treat and improved with IL K today. Intralesional injection - Sacral area -patient currently on systemic Skyrizi  injections but this area in the sacral area biopsy-proven verrucous psoriasis is persistent and we will try to treat and improved with IL K today. Location: sacral area  Informed Consent: Discussed risks (infection, pain, bleeding, bruising, thinning of the skin, loss of skin pigment, lack of resolution, and recurrence of lesion) and benefits of the procedure, as well as the alternatives. Informed consent was obtained. Preparation: The area was prepared a standard fashion.  Procedure Details: An intralesional injection was performed with Kenalog  10 mg/cc. 1.0 cc in total were injected.  Total number of injections: 7  [id number] Expiration date: 09/28/2025 Lot#: [id number]  Plan: The  patient was instructed on post-op care. Recommend OTC analgesia as needed for pain.   Related Medications triamcinolone  acetonide (KENALOG ) 10 MG/ML injection 10 mg   Return in about 4 weeks (around 12/25/2023) for psoraisis follow up/ILK.  LILLETTE Rosina Mayans, CMA, am acting as scribe for Alm Rhyme, MD .   Documentation: I have reviewed the above documentation for accuracy and completeness, and I agree with the above.  Alm Rhyme, MD

## 2023-11-28 ENCOUNTER — Encounter: Payer: Self-pay | Admitting: Dermatology

## 2023-11-29 ENCOUNTER — Other Ambulatory Visit: Payer: Self-pay | Admitting: *Deleted

## 2023-11-29 DIAGNOSIS — E1169 Type 2 diabetes mellitus with other specified complication: Secondary | ICD-10-CM

## 2023-11-29 DIAGNOSIS — I152 Hypertension secondary to endocrine disorders: Secondary | ICD-10-CM

## 2023-11-29 DIAGNOSIS — E1165 Type 2 diabetes mellitus with hyperglycemia: Secondary | ICD-10-CM

## 2023-12-02 ENCOUNTER — Other Ambulatory Visit

## 2023-12-02 DIAGNOSIS — I152 Hypertension secondary to endocrine disorders: Secondary | ICD-10-CM

## 2023-12-02 DIAGNOSIS — E1169 Type 2 diabetes mellitus with other specified complication: Secondary | ICD-10-CM

## 2023-12-02 DIAGNOSIS — E1165 Type 2 diabetes mellitus with hyperglycemia: Secondary | ICD-10-CM

## 2023-12-03 ENCOUNTER — Other Ambulatory Visit

## 2023-12-03 DIAGNOSIS — E1169 Type 2 diabetes mellitus with other specified complication: Secondary | ICD-10-CM | POA: Diagnosis not present

## 2023-12-03 DIAGNOSIS — E1159 Type 2 diabetes mellitus with other circulatory complications: Secondary | ICD-10-CM | POA: Diagnosis not present

## 2023-12-03 DIAGNOSIS — Z794 Long term (current) use of insulin: Secondary | ICD-10-CM | POA: Diagnosis not present

## 2023-12-03 DIAGNOSIS — I152 Hypertension secondary to endocrine disorders: Secondary | ICD-10-CM | POA: Diagnosis not present

## 2023-12-03 DIAGNOSIS — E785 Hyperlipidemia, unspecified: Secondary | ICD-10-CM | POA: Diagnosis not present

## 2023-12-03 DIAGNOSIS — E1165 Type 2 diabetes mellitus with hyperglycemia: Secondary | ICD-10-CM | POA: Diagnosis not present

## 2023-12-04 ENCOUNTER — Ambulatory Visit: Payer: Self-pay | Admitting: Family Medicine

## 2023-12-04 LAB — CBC WITH DIFFERENTIAL/PLATELET
Basophils Absolute: 0.1 x10E3/uL (ref 0.0–0.2)
Basos: 1 %
EOS (ABSOLUTE): 0.3 x10E3/uL (ref 0.0–0.4)
Eos: 4 %
Hematocrit: 49 % (ref 37.5–51.0)
Hemoglobin: 16.7 g/dL (ref 13.0–17.7)
Immature Grans (Abs): 0 x10E3/uL (ref 0.0–0.1)
Immature Granulocytes: 0 %
Lymphocytes Absolute: 2.8 x10E3/uL (ref 0.7–3.1)
Lymphs: 36 %
MCH: 30.5 pg (ref 26.6–33.0)
MCHC: 34.1 g/dL (ref 31.5–35.7)
MCV: 89 fL (ref 79–97)
Monocytes Absolute: 0.7 x10E3/uL (ref 0.1–0.9)
Monocytes: 9 %
Neutrophils Absolute: 3.8 x10E3/uL (ref 1.4–7.0)
Neutrophils: 50 %
Platelets: 304 x10E3/uL (ref 150–450)
RBC: 5.48 x10E6/uL (ref 4.14–5.80)
RDW: 12.4 % (ref 11.6–15.4)
WBC: 7.7 x10E3/uL (ref 3.4–10.8)

## 2023-12-04 LAB — COMPREHENSIVE METABOLIC PANEL WITH GFR
ALT: 55 IU/L — ABNORMAL HIGH (ref 0–44)
AST: 44 IU/L — ABNORMAL HIGH (ref 0–40)
Albumin: 4.8 g/dL (ref 4.1–5.1)
Alkaline Phosphatase: 63 IU/L (ref 44–121)
BUN/Creatinine Ratio: 15 (ref 9–20)
BUN: 12 mg/dL (ref 6–24)
Bilirubin Total: 0.6 mg/dL (ref 0.0–1.2)
CO2: 21 mmol/L (ref 20–29)
Calcium: 9.7 mg/dL (ref 8.7–10.2)
Chloride: 97 mmol/L (ref 96–106)
Creatinine, Ser: 0.79 mg/dL (ref 0.76–1.27)
Globulin, Total: 2.7 g/dL (ref 1.5–4.5)
Glucose: 130 mg/dL — ABNORMAL HIGH (ref 70–99)
Potassium: 4.8 mmol/L (ref 3.5–5.2)
Sodium: 136 mmol/L (ref 134–144)
Total Protein: 7.5 g/dL (ref 6.0–8.5)
eGFR: 112 mL/min/1.73 (ref >59)

## 2023-12-04 LAB — HEMOGLOBIN A1C
Est. average glucose Bld gHb Est-mCnc: 131 mg/dL
Hgb A1c MFr Bld: 6.2 % — ABNORMAL HIGH (ref 4.8–5.6)

## 2023-12-04 LAB — LIPID PANEL
Chol/HDL Ratio: 7.9 ratio — ABNORMAL HIGH (ref 0.0–5.0)
Cholesterol, Total: 293 mg/dL — ABNORMAL HIGH (ref 100–199)
HDL: 37 mg/dL — ABNORMAL LOW (ref >39)
LDL Chol Calc (NIH): 134 mg/dL — ABNORMAL HIGH (ref 0–99)
Triglycerides: 652 mg/dL (ref 0–149)
VLDL Cholesterol Cal: 122 mg/dL — ABNORMAL HIGH (ref 5–40)

## 2023-12-09 ENCOUNTER — Encounter: Payer: Self-pay | Admitting: Family Medicine

## 2023-12-09 ENCOUNTER — Ambulatory Visit (INDEPENDENT_AMBULATORY_CARE_PROVIDER_SITE_OTHER): Admitting: Family Medicine

## 2023-12-09 VITALS — BP 126/87 | HR 92 | Ht 68.0 in | Wt 214.1 lb

## 2023-12-09 DIAGNOSIS — E1165 Type 2 diabetes mellitus with hyperglycemia: Secondary | ICD-10-CM | POA: Diagnosis not present

## 2023-12-09 DIAGNOSIS — L400 Psoriasis vulgaris: Secondary | ICD-10-CM

## 2023-12-09 DIAGNOSIS — E781 Pure hyperglyceridemia: Secondary | ICD-10-CM | POA: Diagnosis not present

## 2023-12-09 DIAGNOSIS — Z0000 Encounter for general adult medical examination without abnormal findings: Secondary | ICD-10-CM

## 2023-12-09 DIAGNOSIS — J329 Chronic sinusitis, unspecified: Secondary | ICD-10-CM | POA: Diagnosis not present

## 2023-12-09 DIAGNOSIS — E1169 Type 2 diabetes mellitus with other specified complication: Secondary | ICD-10-CM | POA: Diagnosis not present

## 2023-12-09 DIAGNOSIS — I152 Hypertension secondary to endocrine disorders: Secondary | ICD-10-CM | POA: Diagnosis not present

## 2023-12-09 DIAGNOSIS — E785 Hyperlipidemia, unspecified: Secondary | ICD-10-CM | POA: Diagnosis not present

## 2023-12-09 DIAGNOSIS — H5789 Other specified disorders of eye and adnexa: Secondary | ICD-10-CM

## 2023-12-09 DIAGNOSIS — E1159 Type 2 diabetes mellitus with other circulatory complications: Secondary | ICD-10-CM

## 2023-12-09 DIAGNOSIS — G959 Disease of spinal cord, unspecified: Secondary | ICD-10-CM

## 2023-12-09 DIAGNOSIS — E782 Mixed hyperlipidemia: Secondary | ICD-10-CM

## 2023-12-09 DIAGNOSIS — Z794 Long term (current) use of insulin: Secondary | ICD-10-CM

## 2023-12-09 DIAGNOSIS — M5412 Radiculopathy, cervical region: Secondary | ICD-10-CM | POA: Diagnosis not present

## 2023-12-09 MED ORDER — OLMESARTAN MEDOXOMIL 20 MG PO TABS: 20.0000 mg | ORAL_TABLET | Freq: Every morning | ORAL | 3 refills | Status: AC

## 2023-12-09 MED ORDER — AMOXICILLIN-POT CLAVULANATE 875-125 MG PO TABS: 1.0000 | ORAL_TABLET | Freq: Two times a day (BID) | ORAL | 0 refills | Status: AC

## 2023-12-09 MED ORDER — ROSUVASTATIN CALCIUM 40 MG PO TABS: 20.0000 mg | ORAL_TABLET | Freq: Every day | ORAL | 3 refills | Status: DC

## 2023-12-09 NOTE — Progress Notes (Signed)
 Annual physical  Subjective   Patient ID: EMMIT ORILEY, male    DOB: 04/14/1981  Age: 43 y.o. MRN: [id number]  Chief Complaint  Patient presents with   Annual Exam   HPI Daimien is a 43 y.o. old male here  for annual exam.   Subjective - Here for an annual physical. - Reports persistent unilateral carpal tunnel symptoms, unimproved despite prior surgery years ago. - Ongoing burning pain in the neck, back, feet, and legs. This has not improved following a cervical spine surgery performed recently or three years ago. The primary complaint is lower back and right leg pain, with associated right leg weakness and dragging. Reports occasionally losing function in the right leg, leading to falls. - Neuropathy in feet, described as constant burning in the toes. Has altered sensation in the right foot, with an inability to distinguish hot from cold, but experiences pain with cold stimuli. - History of severe plaque psoriasis since age 43, now well-controlled on Skyrizi . A single persistent plaque remains on the lower back, which is the precise location of his pain. A dermatologist is treating this with local steroid injections. - Reports persistent discharge from one eye for the past year, causing significant pain and sleep disturbance. Saw an ENT who diagnosed a sinus infection.   Medications Currently taking Ozempic , restarted two weeks ago after a brief hold for surgery; experiences significant nausea and stomach pain for a few days after each dose. Takes amlodipine  and rosuvastatin . Has not been able to fill olmesartan  for several months. Has tried gabapentin  in the past but stopped due to side effects of feeling high and drunk. Refuses pain medications due to nausea. Previous diabetes regimen included Novolog  and Lantus . Previous psoriasis treatments included Enbrel and Humira.  PMH, PSH, FH, Social Hx PMH: Carpal tunnel syndrome, chronic pain/neuropathy, cervical spine disease, type 2  diabetes, hyperlipidemia, hypertension, psoriasis, recurrent sinusitis. PSH: Carpal tunnel surgery (years ago), cervical spine surgery (3 years ago). FH: Mother with scoliosis and history of back surgery. Social Hx: Drinks alcohol occasionally on weekends, reduced from previous intake.   ROS Constitutional: Denies major concerns. Neurologic: Positive for burning pain, tingling, weakness, gait instability. Denies issues with supplements like B vitamins. Dermatologic: Positive for a single psoriatic plaque on the lower back. Eyes: Positive for persistent unilateral eye discharge and pain.    Health Maintenance Due  Topic Date Due   COVID-19 Vaccine (1) Never done   Pneumococcal Vaccine: 19-49 Years (1 of 2 - PCV) Never done   Hepatitis B Vaccines (1 of 3 - 19+ 3-dose series) Never done   HPV VACCINES (1 - Risk 3-dose SCDM series) Never done   INFLUENZA VACCINE  11/29/2023      Objective:     BP 126/87   Pulse 92   Ht 5' 8 (1.727 m)   Wt 214 lb 1.9 oz (97.1 kg)   SpO2 98%   BMI 32.56 kg/m    Physical Exam General: No acute distress. Heent: perrla, eomi, mild swelling of left eye. No erythma or discharge CV: Regular rate and rhythm. PULM: Lungs clear to auscultation bilaterally. Gi: soft, nontender.  SKIN: Psoriatic plaque noted on the lower back at the site of reported pain. Psych: pleasant affect   No results found for any visits on 12/09/23.      Assessment & Plan:   Physical exam, annual  Plaque psoriasis Assessment & Plan: History of severe psoriasis, now well-controlled on Skyrizi  except for a single persistent plaque  on the lower back at the site of his pain. Managed by his dermatologist. - Continue current management with dermatology.   Chronic sinusitis, unspecified location Assessment & Plan: Ongoing unilateral eye discharge and pain for over a year, attributed to sinusitis.  - Will provide a referral to an ophthalmologist in [place]. -  Prescribed a course of antibiotics for presumed bacterial sinusitis. Counseled on using caution with unverified remedies like boom boom sticks.   Eye drainage -     Ambulatory referral to Ophthalmology  Hyperlipidemia associated with type 2 diabetes mellitus (HCC) Assessment & Plan: Continue rosuvastatin    Hypertension associated with diabetes (HCC) Assessment & Plan: - Restart olmesartan  at a lower dose of 20 mg daily for renal protection given his diabetes. Prescription with one year of refills sent to pharmacy.   Type 2 diabetes mellitus with hyperglycemia, with long-term current use of insulin  (HCC) Assessment & Plan: Recent A1C was 6.5, elevated from baseline, likely due to a recent temporary discontinuation of Ozempic  for surgery. Patient is back on Ozempic  but experiences significant GI side effects. - Discussed switching from Ozempic  to Mounjaro for potentially better tolerance and efficacy. Advised to discuss with his endocrinologist, as they manage his diabetes. - F/U in 6 months. Will order labs, including A1C and lipid panel, prior to the visit.   Cervical myelopathy with cervical radiculopathy [facility]) Assessment & Plan: complex history of chronic burning pain in the back and lower extremities, most prominently the right leg, with associated weakness and gait disturbance. Symptoms persist despite recent cervical spine surgery. Neuropathy symptoms are also present in the feet. He has a history of carpal tunnel syndrome. Investigations have included MRIs and nerve conduction studies. He is scheduled for physical therapy but is concerned the focus will not be on his primary complaint in the lower back. - Continue current management with neurosurgery. - Patient advised on other potential non-pharmacologic treatments for neuropathy including Qutenza patch (applied by Physical Medicine & Rehab specialist) and vibrating foot plates. - Follow up with specialists as scheduled (Dr.  Claudene, Dr. Jeannetta, PT).   Hyperlipidemia, mixed -     Rosuvastatin  Calcium ; Take 0.5 tablets (20 mg total) by mouth daily.  Dispense: 90 tablet; Refill: 3  Hypertriglyceridemia -     Rosuvastatin  Calcium ; Take 0.5 tablets (20 mg total) by mouth daily.  Dispense: 90 tablet; Refill: 3  Other orders -     Olmesartan  Medoxomil; Take 1 tablet (20 mg total) by mouth in the morning.  Dispense: 90 tablet; Refill: 3 -     Amoxicillin -Pot Clavulanate; Take 1 tablet by mouth 2 (two) times daily for 7 days.  Dispense: 14 tablet; Refill: 0     Return in about 6 months (around 06/10/2024) for HTN, hld.    Toribio MARLA Slain, MD

## 2023-12-09 NOTE — Patient Instructions (Addendum)
 It was nice to see you today,  We addressed the following topics today: -I have sent in your olmesartan  again at a lower dose. - I have increased your dose of your cholesterol medicine, rosuvastatin . - I have sent in an antibiotic for you to take for 7 days - I am sending in a referral to the ophthalmologist for a second opinion on your eye. - you can ask your endocrinologist if Pat might be a better option than Ozempic  for you.   Have a great day,  Rolan Slain, MD

## 2023-12-11 ENCOUNTER — Encounter: Admitting: Neurosurgery

## 2023-12-11 NOTE — Assessment & Plan Note (Signed)
 Ongoing unilateral eye discharge and pain for over a year, attributed to sinusitis.  - Will provide a referral to an ophthalmologist in [place]. - Prescribed a course of antibiotics for presumed bacterial sinusitis. Counseled on using caution with unverified remedies like boom boom sticks.

## 2023-12-11 NOTE — Assessment & Plan Note (Signed)
-   Restart olmesartan  at a lower dose of 20 mg daily for renal protection given his diabetes. Prescription with one year of refills sent to pharmacy.

## 2023-12-11 NOTE — Assessment & Plan Note (Signed)
 complex history of chronic burning pain in the back and lower extremities, most prominently the right leg, with associated weakness and gait disturbance. Symptoms persist despite recent cervical spine surgery. Neuropathy symptoms are also present in the feet. He has a history of carpal tunnel syndrome. Investigations have included MRIs and nerve conduction studies. He is scheduled for physical therapy but is concerned the focus will not be on his primary complaint in the lower back. - Continue current management with neurosurgery. - Patient advised on other potential non-pharmacologic treatments for neuropathy including Qutenza patch (applied by Physical Medicine & Rehab specialist) and vibrating foot plates. - Follow up with specialists as scheduled (Dr. Claudene, Dr. Jeannetta, PT).

## 2023-12-11 NOTE — Assessment & Plan Note (Signed)
 Recent A1C was 6.5, elevated from baseline, likely due to a recent temporary discontinuation of Ozempic  for surgery. Patient is back on Ozempic  but experiences significant GI side effects. - Discussed switching from Ozempic  to Mounjaro for potentially better tolerance and efficacy. Advised to discuss with his endocrinologist, as they manage his diabetes. - F/U in 6 months. Will order labs, including A1C and lipid panel, prior to the visit.

## 2023-12-11 NOTE — Assessment & Plan Note (Signed)
 Continue rosuvastatin.

## 2023-12-11 NOTE — Assessment & Plan Note (Signed)
 History of severe psoriasis, now well-controlled on Skyrizi  except for a single persistent plaque on the lower back at the site of his pain. Managed by his dermatologist. - Continue current management with dermatology.

## 2023-12-12 NOTE — Addendum Note (Signed)
 Addended by: CHANDRA TORIBIO POUR on: 12/12/2023 09:53 AM   Modules accepted: Level of Service

## 2023-12-16 ENCOUNTER — Encounter: Admitting: Internal Medicine

## 2023-12-16 NOTE — Progress Notes (Deleted)
 Office Visit Note  Patient: Louis Carney             Date of Birth: Mar 19, 1981           MRN: [id number]             PCP: Chandra Toribio POUR, MD Referring: Chandra Toribio POUR, MD Visit Date: 12/16/2023 Occupation: @GUAROCC @  Subjective:  No chief complaint on file.   History of Present Illness: Louis Carney is a 43 y.o. male ***     Activities of Daily Living:  Patient reports morning stiffness for *** {minute/hour:19697}.   Patient {ACTIONS;DENIES/REPORTS:21021675::Denies} nocturnal pain.  Difficulty dressing/grooming: {ACTIONS;DENIES/REPORTS:21021675::Denies} Difficulty climbing stairs: {ACTIONS;DENIES/REPORTS:21021675::Denies} Difficulty getting out of chair: {ACTIONS;DENIES/REPORTS:21021675::Denies} Difficulty using hands for taps, buttons, cutlery, and/or writing: {ACTIONS;DENIES/REPORTS:21021675::Denies}  No Rheumatology ROS completed.   PMFS History:  Patient Active Problem List   Diagnosis Date Noted   Carpal tunnel syndrome on left 10/31/2023   Hx of cervical spine surgery 08/21/2023   Cervical myelopathy with cervical radiculopathy (HCC) 08/09/2023   Numbness and tingling 06/26/2023   Gait instability 06/26/2023   Hyperreflexia 06/26/2023   Carpal tunnel syndrome of right wrist 02/07/2023   Chalazion of left upper eyelid 01/08/2023   Chronic sinusitis 09/25/2022   Allergic rhinitis due to animal hair and dander 09/25/2022   Diabetes mellitus (HCC) 05/28/2019   Morbid obesity (HCC) 05/28/2019   Alcoholic hepatitis without ascites 05/28/2019   Alcohol abuse with alcohol-induced disorder (HCC) 02/07/2019   Adjustment disorder with mixed anxiety and depressed mood 02/05/2019   Hepatocellular damage 11/07/2018   Alcohol abuse 09/09/2018   Ulnar neuropathy 06/03/2018   HNP (herniated nucleus pulposus), lumbar 03/07/2018   Medication monitoring encounter 11/29/2017   Spinal stenosis of lumbar region 10/01/2017   Cervical spondylosis with myelopathy  10/01/2017   High risk medication use 09/12/2017   Vitamin D  deficiency 09/12/2017   Hyperlipidemia associated with type 2 diabetes mellitus (HCC) 09/12/2017   Low level of high density lipoprotein (HDL) 09/12/2017   Hypertriglyceridemia 09/12/2017   Chronic radicular low back pain- R sided 08/26/2017   History of excision of lamina of cervical vertebra for decompression of sp- C4-6inal cord 08/26/2017   Neuropathy due to medical condition (HCC)-C8-T1 nerve distribution bilateral upper extremity, 08/26/2017   RSD (reflex sympathetic dystrophy)- right entire leg painful to non-noxious stimuli 08/26/2017   Cigarette smoker- 3 ppd for 6 yrs, then 1ppd for 9 yrs- 27 pk yr hx 08/26/2017   Drinks beer- 6-8 beers/d on ave ( 10 Yrs or so) 08/26/2017   Hypertension associated with diabetes (HCC) 08/26/2017   NSAID long-term use-4 ibuprofen 3 times daily times at least 5 years 08/26/2017   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 08/26/2017   Fatigue 08/26/2017   Mood disorder (HCC)- secondary to chronic pain syndrome 08/26/2017   Insomnia 08/26/2017   Environmental allergies 08/26/2017   ED (erectile dysfunction) 08/26/2017   Allergic conjunctivitis of both eyes and rhinitis 08/26/2017   Class 1 obesity 08/26/2017   Plaque psoriasis 08/26/2017    Past Medical History:  Diagnosis Date   Alcohol abuse    Arthritis    Carpal tunnel syndrome of right wrist 12/2017   Cervical disc herniation    C4-5   Cervical myelopathy (HCC)    Chronic sinusitis    GERD (gastroesophageal reflux disease)    Headache    migraines   HNP (herniated nucleus pulposus), lumbar 02/2018   Hyperlipidemia, mixed    Hypertension associated with diabetes (  HCC)    Pneumonia    Psoriasis    since age 43 years   TB lung, latent 11/06/2017   Transaminitis 01/29/2018   Tuberculosis    latent TB infection on treatment   Type 2 diabetes mellitus with hyperglycemia (HCC)     Family History  Problem  Relation Age of Onset   Alcohol abuse Mother    Hypertension Mother    Lung cancer Mother    Alcohol abuse Father    Hypertension Father    Colon cancer Father        dx in his late 50's   Esophageal cancer Neg Hx    Rectal cancer Neg Hx    Past Surgical History:  Procedure Laterality Date   CARPAL TUNNEL RELEASE Right 01/15/2018   Procedure: Right Carpal tunnel release;  Surgeon: Gillie Duncans, MD;  Location: [facility] OR;  Service: Neurosurgery;  Laterality: Right;  Right Carpal tunnel release   CARPAL TUNNEL RELEASE Left 10/31/2023   Procedure: CARPAL TUNNEL RELEASE;  Surgeon: Claudene Penne ORN, MD;  Location: [facility] ORS;  Service: Neurosurgery;  Laterality: Left;  LEFT CARPAL TUNNEL RELEASE WITH POSSIBLE CONVERT TO OPEN   CERVICAL DISC ARTHROPLASTY N/A 07/11/2023   Procedure: C4-5 ARTHROPLASTY;  Surgeon: Claudene Penne ORN, MD;  Location: [facility] ORS;  Service: Neurosurgery;  Laterality: N/A;   CERVICAL SPINE SURGERY     LUMBAR LAMINECTOMY/DECOMPRESSION MICRODISCECTOMY Right 03/07/2018   Procedure: Right Lumbar 5 Sacral 1 Microdiscectomy;  Surgeon: Gillie Duncans, MD;  Location: [facility] OR;  Service: Neurosurgery;  Laterality: Right;  Right Lumbar 5 Sacral 1 Microdiscectomy   Social History   Social History Narrative   Married, 3 biologic children 2 stepchildren    disabled due to back pain   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   Objective: Vital Signs: There were no vitals taken for this visit.   Physical Exam   Musculoskeletal Exam: ***  CDAI Exam: CDAI Score: -- Patient Global: --; Provider Global: -- Swollen: --; Tender: -- Joint Exam 12/16/2023   No joint exam has been documented for this visit   There is currently no information documented on the homunculus. Go to the Rheumatology activity and complete the homunculus  joint exam.  Investigation: No additional findings.  Imaging: No results found.  Recent Labs: Lab Results  Component Value Date   WBC 7.7 12/03/2023   HGB 16.7 12/03/2023   PLT 304 12/03/2023   NA 136 12/03/2023   K 4.8 12/03/2023   CL 97 12/03/2023   CO2 21 12/03/2023   GLUCOSE 130 (H) 12/03/2023   BUN 12 12/03/2023   CREATININE 0.79 12/03/2023   BILITOT 0.6 12/03/2023   ALKPHOS 63 12/03/2023   AST 44 (H) 12/03/2023   ALT 55 (H) 12/03/2023   PROT 7.5 12/03/2023   ALBUMIN 4.8 12/03/2023   CALCIUM  9.7 12/03/2023   GFRAA >60 12/25/2019    Speciality Comments: No specialty comments available.  Procedures:  No procedures performed Allergies: Methocarbamol    Assessment / Plan:     Visit Diagnoses: No diagnosis found.  Orders: No orders of the defined types were placed in this encounter.  No orders of the defined types were placed in this encounter.   Face-to-face time spent with patient was *** minutes. Greater than 50% of time was spent in  counseling and coordination of care.  Follow-Up Instructions: No follow-ups on file.   Lonni LELON Ester, MD  Note - This record has been created using [employer].  Chart creation errors have been sought, but may not always  have been located. Such creation errors do not reflect on  the standard of medical care.

## 2023-12-18 ENCOUNTER — Ambulatory Visit (INDEPENDENT_AMBULATORY_CARE_PROVIDER_SITE_OTHER): Admitting: Physician Assistant

## 2023-12-18 DIAGNOSIS — G5602 Carpal tunnel syndrome, left upper limb: Secondary | ICD-10-CM

## 2023-12-18 DIAGNOSIS — G8929 Other chronic pain: Secondary | ICD-10-CM

## 2023-12-18 DIAGNOSIS — Z09 Encounter for follow-up examination after completed treatment for conditions other than malignant neoplasm: Secondary | ICD-10-CM

## 2023-12-18 DIAGNOSIS — M549 Dorsalgia, unspecified: Secondary | ICD-10-CM

## 2023-12-18 DIAGNOSIS — M4712 Other spondylosis with myelopathy, cervical region: Secondary | ICD-10-CM

## 2023-12-18 NOTE — Progress Notes (Signed)
 DOS:  C4-5 arthroplasty, 07/11/2023  Left ultrasound-guided carpal tunnel release, 10/31/2023  Discussed with patient that he continues to have tingling in bilateral hands.  They are still able to function and not become worse.  He still bothered by the tingling on the tips of his fingers.  He is due to start physical therapy next week for his prolonged back pain.  Plan to see back in the next 2 months regarding his lumbar spine and neck steps.  Asked to reach out to us  for any questions or concerns she has before this.    This visit was performed via telephone.  Patient location: home Provider location: office  I spent a total of 10 minutes non-face-to-face activities for this visit on the date of this encounter including review of current clinical condition and response to treatment.  The patient is aware of and accepts the limits of this telehealth visit.

## 2023-12-24 ENCOUNTER — Ambulatory Visit: Admitting: Dermatology

## 2024-02-03 ENCOUNTER — Ambulatory Visit (INDEPENDENT_AMBULATORY_CARE_PROVIDER_SITE_OTHER): Admitting: Dermatology

## 2024-02-03 ENCOUNTER — Encounter: Payer: Self-pay | Admitting: Dermatology

## 2024-02-03 DIAGNOSIS — Z79899 Other long term (current) drug therapy: Secondary | ICD-10-CM

## 2024-02-03 DIAGNOSIS — Z7189 Other specified counseling: Secondary | ICD-10-CM | POA: Diagnosis not present

## 2024-02-03 DIAGNOSIS — L409 Psoriasis, unspecified: Secondary | ICD-10-CM | POA: Diagnosis not present

## 2024-02-03 NOTE — Patient Instructions (Signed)

## 2024-02-03 NOTE — Progress Notes (Signed)
   Follow-Up Visit   Subjective  Louis Carney is a 43 y.o. male who presents for the following: Bx proven verrucous psoriasis of the sacral area, pt here his 2nd ILK injections, patient report little improvement noticed from his for ILK injection.  Patient currently on Skyrizi  for psoriasis on his body  with a great response. Persistent plaque of sacral area.  Pt got some results at medial spot injected with steroid last visit.   The following portions of the chart were reviewed this encounter and updated as appropriate: medications, allergies, medical history  Review of Systems:  No other skin or systemic complaints except as noted in HPI or Assessment and Plan.  Objective  Well appearing patient in no apparent distress; mood and affect are within normal limits.   A focused examination was performed of the following areas: Sacral area   Relevant exam findings are noted in the Assessment and Plan.  sacral area 5 x 1.5 cm Verrucous plaque of the sacral area      Assessment & Plan   Psoriasis  on Skyrizi   Doing well except for a severely persistent plaque of the sacral area.  Patient does pick and rub at this area so there may be an element of lichen simplex.  The area medial we injected last visit seem to have flattened somewhat but not gone.  He would like to reinject today.  We will increase the dose of the injection to Kenalog  40 diluted to Kenalog  20 injection PSORIASIS sacral area Intralesional injection - sacral area Location: sacral area   Informed Consent: Discussed risks (infection, pain, bleeding, bruising, thinning of the skin, loss of skin pigment, lack of resolution, and recurrence of lesion) and benefits of the procedure, as well as the alternatives. Informed consent was obtained. Preparation: The area was prepared a standard fashion.  Procedure Details: An intralesional injection was performed with Kenalog  40 mg/cc. 1 cc in total were injected.  Total number  of injections: 9  Plan: The patient was instructed on post-op care. Recommend OTC analgesia as needed for pain.   Related Medications triamcinolone  acetonide (KENALOG ) 10 MG/ML injection 20 mg  triamcinolone  acetonide (KENALOG -40) injection 40 mg  COUNSELING AND COORDINATION OF CARE   MEDICATION MANAGEMENT   LONG-TERM USE OF HIGH-RISK MEDICATION    Continue Skyrizi .-- Doing well.   Return for scheduled appt Mar 17, 2024 for Psoriasis .  IFay Kirks, CMA, am acting as scribe for Alm Rhyme, MD .   Documentation: I have reviewed the above documentation for accuracy and completeness, and I agree with the above.  Alm Rhyme, MD

## 2024-02-04 DIAGNOSIS — L409 Psoriasis, unspecified: Secondary | ICD-10-CM | POA: Diagnosis not present

## 2024-02-04 MED ORDER — TRIAMCINOLONE ACETONIDE 40 MG/ML IJ SUSP
40.0000 mg | Freq: Once | INTRAMUSCULAR | Status: AC
  Administered 2024-02-04: 40 mg via INTRAMUSCULAR

## 2024-02-04 MED ORDER — TRIAMCINOLONE ACETONIDE 10 MG/ML IJ SUSP
20.0000 mg | Freq: Once | INTRAMUSCULAR | Status: AC
  Administered 2024-02-04: 20 mg

## 2024-02-10 ENCOUNTER — Other Ambulatory Visit

## 2024-02-10 ENCOUNTER — Other Ambulatory Visit: Payer: Self-pay | Admitting: Family Medicine

## 2024-02-10 DIAGNOSIS — E1169 Type 2 diabetes mellitus with other specified complication: Secondary | ICD-10-CM

## 2024-02-10 DIAGNOSIS — E785 Hyperlipidemia, unspecified: Secondary | ICD-10-CM | POA: Diagnosis not present

## 2024-02-11 LAB — COMPREHENSIVE METABOLIC PANEL WITH GFR
ALT: 32 IU/L (ref 0–44)
AST: 21 IU/L (ref 0–40)
Albumin: 4.4 g/dL (ref 4.1–5.1)
Alkaline Phosphatase: 61 IU/L (ref 47–123)
BUN/Creatinine Ratio: 15 (ref 9–20)
BUN: 11 mg/dL (ref 6–24)
Bilirubin Total: 0.5 mg/dL (ref 0.0–1.2)
CO2: 21 mmol/L (ref 20–29)
Calcium: 9.5 mg/dL (ref 8.7–10.2)
Chloride: 98 mmol/L (ref 96–106)
Creatinine, Ser: 0.73 mg/dL — ABNORMAL LOW (ref 0.76–1.27)
Globulin, Total: 2.6 g/dL (ref 1.5–4.5)
Glucose: 140 mg/dL — ABNORMAL HIGH (ref 70–99)
Potassium: 4.7 mmol/L (ref 3.5–5.2)
Sodium: 137 mmol/L (ref 134–144)
Total Protein: 7 g/dL (ref 6.0–8.5)
eGFR: 115 mL/min/1.73 (ref >59)

## 2024-02-11 LAB — LIPID PANEL
Chol/HDL Ratio: 6.4 ratio — ABNORMAL HIGH (ref 0.0–5.0)
Cholesterol, Total: 251 mg/dL — ABNORMAL HIGH (ref 100–199)
HDL: 39 mg/dL — ABNORMAL LOW (ref >39)
LDL Chol Calc (NIH): 159 mg/dL — ABNORMAL HIGH (ref 0–99)
Triglycerides: 281 mg/dL — ABNORMAL HIGH (ref 0–149)
VLDL Cholesterol Cal: 53 mg/dL — ABNORMAL HIGH (ref 5–40)

## 2024-02-12 ENCOUNTER — Ambulatory Visit: Payer: Self-pay | Admitting: Family Medicine

## 2024-02-12 ENCOUNTER — Telehealth: Payer: Self-pay

## 2024-02-12 ENCOUNTER — Other Ambulatory Visit: Payer: Self-pay | Admitting: Family Medicine

## 2024-02-12 DIAGNOSIS — E782 Mixed hyperlipidemia: Secondary | ICD-10-CM

## 2024-02-12 DIAGNOSIS — E781 Pure hyperglyceridemia: Secondary | ICD-10-CM

## 2024-02-12 MED ORDER — ROSUVASTATIN CALCIUM 40 MG PO TABS: 40.0000 mg | ORAL_TABLET | Freq: Every day | ORAL | 3 refills | Status: AC

## 2024-02-12 NOTE — Progress Notes (Signed)
 Called patient LVM to contact the office if pt calls please advised

## 2024-02-12 NOTE — Telephone Encounter (Signed)
 Copied from CRM [id number]. Topic: Clinical - Lab/Test Results >> Feb 12, 2024 10:28 AM Louis Carney wrote: Reason for CRM: Provided lab results. Patient he is fine with increasing the crestor  from 20mg  to 40mg .

## 2024-02-14 ENCOUNTER — Other Ambulatory Visit: Payer: Self-pay | Admitting: Family Medicine

## 2024-02-14 NOTE — Progress Notes (Signed)
 Called pt he is agreeable to increasing his medication to 40mg 

## 2024-02-19 ENCOUNTER — Ambulatory Visit: Admitting: Neurosurgery

## 2024-02-19 VITALS — BP 160/98 | Ht 68.0 in | Wt 210.0 lb

## 2024-02-19 DIAGNOSIS — M25511 Pain in right shoulder: Secondary | ICD-10-CM | POA: Diagnosis not present

## 2024-02-19 DIAGNOSIS — M5441 Lumbago with sciatica, right side: Secondary | ICD-10-CM

## 2024-02-19 DIAGNOSIS — G8929 Other chronic pain: Secondary | ICD-10-CM | POA: Insufficient documentation

## 2024-02-19 DIAGNOSIS — S4991XA Unspecified injury of right shoulder and upper arm, initial encounter: Secondary | ICD-10-CM | POA: Insufficient documentation

## 2024-02-19 NOTE — Patient Instructions (Signed)

## 2024-02-19 NOTE — Progress Notes (Signed)
   REFERRING PHYSICIAN:  Chandra Toribio POUR, Md [address] [place],  [place] [address]  DOS:  C4-5 arthroplasty, 07/11/2023  Left ultrasound-guided carpal tunnel release, 10/31/2023  HISTORY OF PRESENT ILLNESS: Louis Carney is 4 months status post C4-5 arthroplasty and 6 weeks status post left ultrasound-guided carpal tunnel release.  From a carpal tunnel standpoint he is doing very well.  He is here today instead to follow-up for his ongoing back pain for which he has been continuously evaluated.  He continues to have pain numbness and tingling going down his right lower extremity.  This continues to worsen.  He has had previous surgery at L5-S1 but continues to feel that he is getting worsening symptoms in his L5 distribution and have having worsening back pain.  He has not yet had any physical therapy for this.   PHYSICAL EXAMINATION:  NEUROLOGICAL:  General: In no acute distress.   Awake, alert, oriented to person, place, and time.  Pupils equal round and reactive to light.  Facial tone is symmetric.    Strength: Strength: Side Biceps Triceps Deltoid Interossei Grip Wrist Ext. Wrist Flex.  R 5 4+ 4+ 4+ 4+ 4+ 5  L 5 5 5 4 4 5  4-   Continues to have good strength in his bilateral lower extremities with the exception of a mild right sided foot drop which is chronic.  Incision c/d/I  Imaging:  No recent imaging  Assessment / Plan: Louis Carney is 4 months status post C4-5 arthroplasty and 6 weeks status post left ultrasound-guided carpal tunnel release.  He has had a good relief from his carpal tunnel surgery.  He does continue to have intermittent symptoms with his neck and upper extremities.  His visit today is in regards to his low back pain and history of previous lumbar discectomy.  He continues to have significant low back pain that radiates in his back and down the right lower extremity.  He states that this is getting progressively worse and interfering with his quality  of life.  When we followed up on his recent MRI and imaging it shows continued worsening stenosis at the L5-S1 site mostly in the level of the foramen as he has had ongoing disc collapse.  He had flexion-extension x-rays of his lumbar spine which did not show any clear spondylolisthesis or excess mobility.  Will plan to have him work with physical and Occupational Therapy for further evaluation and further care.  I like to follow-up with him after his work with the physical therapist.  If he continues to have pain there would be some consideration for possible direct decompression with a transforaminal lumbar interbody fusion on the right for his ongoing back pain and worsening radiculopathy.  In regards to his right shoulder pain, this does not appear to be radicular in nature.  It is more likely to be consistent with a possible rotator cuff injury.  He has positive rotator cuff signs on physical examination.  Plan to make a referral to orthopedic surgery for further evaluation.  Penne MICAEL Sharps, MD  Dept of Neurosurgery

## 2024-02-24 ENCOUNTER — Ambulatory Visit (INDEPENDENT_AMBULATORY_CARE_PROVIDER_SITE_OTHER)

## 2024-02-24 DIAGNOSIS — G8929 Other chronic pain: Secondary | ICD-10-CM

## 2024-02-24 DIAGNOSIS — R29898 Other symptoms and signs involving the musculoskeletal system: Secondary | ICD-10-CM | POA: Diagnosis not present

## 2024-02-24 DIAGNOSIS — M25511 Pain in right shoulder: Secondary | ICD-10-CM | POA: Diagnosis not present

## 2024-02-24 DIAGNOSIS — M19011 Primary osteoarthritis, right shoulder: Secondary | ICD-10-CM | POA: Diagnosis not present

## 2024-02-24 NOTE — Patient Instructions (Signed)

## 2024-02-24 NOTE — Progress Notes (Signed)
 Orthopaedic Surgery New Patient Visit   History of Present Illness: The patient is a 43 y.o. male seen in clinic for right shoulder pain. Present for several years. More severe past several months. No new injury/trauma. Associated decreased ROM and strength. Difficulty with ADLs; has to use left hand to assist right arm with washing hair, lifting overhead, and bringing fork to mouth. Has used over the counter ibuprofen with minimal relief. Patient is right hand dominant.   Patient seen by Dr. Penne Sharps with [facility] at [facility] on 02/19/2024. Patient is 7 months s/p C4-C5 arthroplasty and 3 months s/p left ultrasound guided carpal tunnel release. Patient reported right shoulder pain. Per neurosurgeon, did not seem radicular in nature, and on physical exam appeared consistent with rotator cuff injury. Patient was referred to orthopedics.  Patient with DM2; last A1c 6.2 on 12/03/2023.  Patient not currently working; on disability.    Past Medical, Social and Family History: Past Medical History:  Diagnosis Date   Alcohol abuse    Arthritis    Carpal tunnel syndrome of right wrist 12/2017   Cervical disc herniation    C4-5   Cervical myelopathy (HCC)    Chronic sinusitis    GERD (gastroesophageal reflux disease)    Headache    migraines   HNP (herniated nucleus pulposus), lumbar 02/2018   Hyperlipidemia, mixed    Hypertension associated with diabetes (HCC)    Pneumonia    Psoriasis    since age 38 years   TB lung, latent 11/06/2017   Transaminitis 01/29/2018   Tuberculosis    latent TB infection on treatment   Type 2 diabetes mellitus with hyperglycemia (HCC)    Past Surgical History:  Procedure Laterality Date   CARPAL TUNNEL RELEASE Right 01/15/2018   Procedure: Right Carpal tunnel release;  Surgeon: Gillie Duncans, MD;  Location: [facility] OR;  Service: Neurosurgery;  Laterality: Right;  Right Carpal tunnel release   CARPAL TUNNEL RELEASE Left 10/31/2023    Procedure: CARPAL TUNNEL RELEASE;  Surgeon: Sharps Penne ORN, MD;  Location: [facility] ORS;  Service: Neurosurgery;  Laterality: Left;  LEFT CARPAL TUNNEL RELEASE WITH POSSIBLE CONVERT TO OPEN   CERVICAL DISC ARTHROPLASTY N/A 07/11/2023   Procedure: C4-5 ARTHROPLASTY;  Surgeon: Sharps Penne ORN, MD;  Location: [facility] ORS;  Service: Neurosurgery;  Laterality: N/A;   CERVICAL SPINE SURGERY     LUMBAR LAMINECTOMY/DECOMPRESSION MICRODISCECTOMY Right 03/07/2018   Procedure: Right Lumbar 5 Sacral 1 Microdiscectomy;  Surgeon: Gillie Duncans, MD;  Location: [facility] OR;  Service: Neurosurgery;  Laterality: Right;  Right Lumbar 5 Sacral 1 Microdiscectomy   Allergies  Allergen Reactions   Methocarbamol  Nausea And Vomiting    Vomited after taking 1 pill   Current Outpatient Medications on File Prior to Visit  Medication Sig Dispense Refill   amLODipine  (NORVASC ) 10 MG tablet Take 10 mg by mouth in the morning.     Blood Glucose Monitoring Suppl (ONETOUCH VERIO) w/Device KIT Inject 1 Lancet into the skin as directed.     celecoxib  (CELEBREX ) 200 MG capsule Take 1 capsule by mouth twice daily (Patient taking differently: Take 200 mg by mouth 2 (two) times daily as needed (pain.).) 60 capsule 2   colchicine  0.6 MG tablet Take 0.6 mg by mouth daily.     esomeprazole (NEXIUM) 20 MG capsule Take 20 mg by mouth in the morning.     fluticasone  (FLONASE ) 50 MCG/ACT nasal spray Place 2 sprays into both nostrils daily.     gabapentin  (NEURONTIN )  300 MG capsule Take 300 mg by mouth daily as needed.     ibuprofen (ADVIL) 200 MG tablet Take 800 mg by mouth in the morning.     Naphazoline HCl (CLEAR EYES OP) Place 1 drop into both eyes 3 (three) times daily as needed (irritated eyes.).     Naphazoline-Pheniramine (OPCON-A OP) Place 1 drop into both eyes daily as needed (allergies).     olmesartan  (BENICAR ) 20 MG tablet Take 1 tablet (20 mg total) by mouth in the morning. 90 tablet 3   OneTouch Delica Lancets 33G MISC USE TO TEST  BLOOD SUGARS 6 TIMES DAILY (BEFORE MEALS AND 2 HOURS AFTER MEALS) 200 each 0   ONETOUCH VERIO test strip USE TO TEST 6 TIMES DAILY (BEFORE MEALS AND 2 HOURS AFTER MEALS) 200 each 0   OZEMPIC , 1 MG/DOSE, 4 MG/3ML SOPN INJECT 1 DOSE SUBCUTANEOUSLY ONCE A WEEK (Patient taking differently: Inject 1 mg into the skin every Sunday.) 9 mL 0   Phenylephrine  HCl (SINEX REGULAR NA) Place 1 spray into the nose daily as needed (congestion).     pregabalin  (LYRICA ) 25 MG capsule Take 1 capsule (25 mg total) by mouth 2 (two) times daily. 60 capsule 1   risankizumab -rzaa (SKYRIZI  PEN) 150 MG/ML pen INJECT 1 PEN SUBCUTANEOUSLY EVERY 12 WEEKS AS DIRECTED. 1 mL 2   rosuvastatin  (CRESTOR ) 40 MG tablet Take 1 tablet (40 mg total) by mouth daily. 90 tablet 3   sildenafil  (VIAGRA ) 100 MG tablet TAKE 1/2 TABLET BY MOUTH 30 MINUTES PRIOR TO INTERCOURSE AS NEEDED 30 tablet 3   No current facility-administered medications on file prior to visit.   Social History   Tobacco Use   Smoking status: Every Day    Current packs/day: 1.00    Average packs/day: 1 pack/day for 15.0 years (15.0 ttl pk-yrs)    Types: Cigarettes   Smokeless tobacco: Never  Vaping Use   Vaping status: Never Used  Substance Use Topics   Alcohol use: Yes    Alcohol/week: 24.0 standard drinks of alcohol    Types: 24 Standard drinks or equivalent per week    Comment: 6-8 beer per day   Drug use: Never      I have reviewed past medical, surgical, social and family history, medications and allergies as documented in the EMR.  Review of Systems - A ROS was performed including pertinent positives and negatives as documented in the HPI.     Physical Exam:  General/Constitutional: NAD Vascular: No edema, swelling or tenderness, except as noted in detailed exam Integumentary: No impressive skin lesions present, except as noted in detailed exam Neuro/Psych: Normal mood and affect, oriented to person, place and time Musculoskeletal: Normal,  except as noted in detailed exam and in HPI   Focused examination:  Neck focused exam: Well-healed incision over the anterior cervical spine Palpation: non-tender to palpation about the mid-line spine and paraspinal musculature ROM: within normal limits in flexion, extension, rotation and side-bending Spurling's negative   Shoulder focused exam:  Bilateral shoulders normal to inspection. No gross deformity. No erythema or ecchymosis. Patient sitting comfortably on examination table.    RIGHT  Scapula Atrophy Positive   Winging Negative  Rotator cuff Supraspinatus 4-/5   Infraspinatus 4-/5   Subscapularis 4-/5  AROM/PROM (degrees) FF 0-90 / 0-170   Abduction 0-90 / 0-170   ER0 0-20 / 0-40 (LUE 60)   IR(back) T10 / T8  Palpation (pain): AC positive   Biceps negative   Coracoid negative  Special Tests: O'Briens positive   Mayo-shear Not performed   Cross body Adduction positive   Speeds  negative   Yergasons negative    Jobe's positive   Neer negative   Hawkins negative   Belly Press positive   Hornblower's positive  Instability: Apprehension & relocation Not performed   Load & shift     Anterior Not performed    Posterior Not performed  Other: Sulcus sign negative   Lateral deltoid 4+/5    Vascular/Lymphatic: Fingers warm and well perfused with 2+ radial pulse.    Neurologic: Sensation intact to the Median, Ulnar and Radial nerve distribution of the hand. Sensation intact to lateral deltoid (axillary nerve).     XR Right Shoulder Imaging: X-rays of the Right shoulder including 4-views (AP, lateral, grashey, Y-view) obtained today 02/24/2024 at [facility] at [facility] were reviewed personally by me.  Per my independent interpretation these images show no acute fracture or dislocation.  Superior migration of humeral head. Mild degenerative changes noted at the glenohumeral joint, mainly along the glenoid rim.  Mild downward sloping acromion.  Mild AC joint arthritis with osteophyte formation on distal end of clavicle and undersurface of acromion. No soft tissue abnormality.  Partially visualized disc arthroplasty within cervical spine.   Assessment:  Subacute right shoulder weakness-concern for rotator cuff pathology History of cervical disc arthroplasty  Plan:  Patient was seen and examined in office today. We reviewed patient's history, examination, and imaging in detail. Based on information available for this encounter, patient with significant deficiency in right shoulder strength and active range of motion.  No known precipitating injury/trauma.  Present for past several months but worsening as of late.  Right shoulder imaging/x-ray reveals mild arthritic changes.  Physical exam suggest possible rotator cuff pathology.  Symptoms also affecting patient's ADLs including feeding, clothing and bathing.  Though patient has known cervical radiculopathy, concern for massive rotator cuff tear.  Discussed findings and concerns with patient.  As a result, we have discussed recommendation to obtain MRI of the right shoulder without contrast to further investigate.  Plan will be for patient to obtain MRI within the next 1 to 2 weeks and schedule follow-up after the MRI has been performed to go over the results.  Patient may continue weightbearing as tolerated and encourage active range of motion of this shoulder to avoid stiffness.    Patient education material was provided.  All questions, concerns and comments were addressed to the best of my ability.  Follow-up: after right shoulder MRI completed   Arlyss GEANNIE Schneider, DO Orthopedic Surgery & Sports Medicine [place]   This document was dictated using Dragon voice recognition software. A reasonable attempt at proof reading has been made to minimize errors.

## 2024-03-01 ENCOUNTER — Other Ambulatory Visit: Payer: Self-pay | Admitting: Family Medicine

## 2024-03-01 DIAGNOSIS — N529 Male erectile dysfunction, unspecified: Secondary | ICD-10-CM

## 2024-03-02 ENCOUNTER — Encounter: Payer: Self-pay | Admitting: Radiology

## 2024-03-04 ENCOUNTER — Inpatient Hospital Stay: Admission: RE | Admit: 2024-03-04 | Discharge: 2024-03-04 | Disposition: A | Source: Ambulatory Visit

## 2024-03-04 DIAGNOSIS — G8929 Other chronic pain: Secondary | ICD-10-CM

## 2024-03-05 DIAGNOSIS — M75121 Complete rotator cuff tear or rupture of right shoulder, not specified as traumatic: Secondary | ICD-10-CM | POA: Diagnosis not present

## 2024-03-05 DIAGNOSIS — M25511 Pain in right shoulder: Secondary | ICD-10-CM | POA: Diagnosis not present

## 2024-03-06 ENCOUNTER — Telehealth: Payer: Self-pay

## 2024-03-06 ENCOUNTER — Ambulatory Visit: Payer: Self-pay

## 2024-03-06 ENCOUNTER — Encounter: Payer: Self-pay | Admitting: Neurosurgery

## 2024-03-06 DIAGNOSIS — Z9889 Other specified postprocedural states: Secondary | ICD-10-CM

## 2024-03-06 DIAGNOSIS — R2681 Unsteadiness on feet: Secondary | ICD-10-CM

## 2024-03-06 DIAGNOSIS — R262 Difficulty in walking, not elsewhere classified: Secondary | ICD-10-CM | POA: Diagnosis not present

## 2024-03-06 DIAGNOSIS — R200 Anesthesia of skin: Secondary | ICD-10-CM

## 2024-03-06 DIAGNOSIS — G8929 Other chronic pain: Secondary | ICD-10-CM

## 2024-03-06 DIAGNOSIS — M51372 Other intervertebral disc degeneration, lumbosacral region with discogenic back pain and lower extremity pain: Secondary | ICD-10-CM | POA: Diagnosis not present

## 2024-03-06 DIAGNOSIS — R278 Other lack of coordination: Secondary | ICD-10-CM | POA: Diagnosis not present

## 2024-03-06 DIAGNOSIS — M5416 Radiculopathy, lumbar region: Secondary | ICD-10-CM | POA: Diagnosis not present

## 2024-03-06 DIAGNOSIS — R29898 Other symptoms and signs involving the musculoskeletal system: Secondary | ICD-10-CM

## 2024-03-06 NOTE — Telephone Encounter (Signed)
 Spoke with patient this morning regarding follow-up results of his right shoulder MRI.  Explained to patient that he has full-thickness retracted rotator cuff tear that is chronic in nature with evidence of severe fatty atrophy of supraspinatus and infraspinatus.  He also has associated atrophy of his teres minor and long head biceps tendinopathy as well as AC joint arthritis.  Discussed with patient that it is unlikely a standard rotator cuff repair will be possible given the chronicity and severity of his rotator cuff condition.  His MRI findings do correlate with clinical exam findings.  We had a discussion about next steps.  We believe he would benefit from referral to specialist who may consider surgical management options for his complex situation.  Referral will be placed for Dr. Tobie at [facility] for further discussion.  Patient acknowledges and would like to move forward with referral at this time.  Patient may follow-up with our office as needed moving forward.  All questions and concerns were addressed during telephone encounter today.

## 2024-03-06 NOTE — Telephone Encounter (Signed)
 Patient is calling to find out what the next step is now that his MRI is completed.

## 2024-03-06 NOTE — Telephone Encounter (Signed)
 Scheduled for 03/11/2024

## 2024-03-09 NOTE — Telephone Encounter (Signed)
 Spoke with Powell at [facility]- patient came to them stating he needed PT per Dr Claudene and his initial visit with them was on 03/06/2024. Referral originally was sent to [facility] but patient told Powell they were going to charge him a lot to go there. We were not aware of this. Referral put in again and re sent to [facility].

## 2024-03-11 ENCOUNTER — Ambulatory Visit (INDEPENDENT_AMBULATORY_CARE_PROVIDER_SITE_OTHER): Admitting: Physician Assistant

## 2024-03-11 VITALS — BP 134/92 | Ht 68.0 in | Wt 210.0 lb

## 2024-03-11 DIAGNOSIS — M79604 Pain in right leg: Secondary | ICD-10-CM | POA: Diagnosis not present

## 2024-03-11 DIAGNOSIS — R200 Anesthesia of skin: Secondary | ICD-10-CM | POA: Diagnosis not present

## 2024-03-11 DIAGNOSIS — R262 Difficulty in walking, not elsewhere classified: Secondary | ICD-10-CM | POA: Diagnosis not present

## 2024-03-11 DIAGNOSIS — N39498 Other specified urinary incontinence: Secondary | ICD-10-CM | POA: Diagnosis not present

## 2024-03-11 DIAGNOSIS — R278 Other lack of coordination: Secondary | ICD-10-CM | POA: Diagnosis not present

## 2024-03-11 DIAGNOSIS — N3941 Urge incontinence: Secondary | ICD-10-CM

## 2024-03-11 DIAGNOSIS — M5416 Radiculopathy, lumbar region: Secondary | ICD-10-CM

## 2024-03-11 DIAGNOSIS — G8929 Other chronic pain: Secondary | ICD-10-CM

## 2024-03-11 DIAGNOSIS — M549 Dorsalgia, unspecified: Secondary | ICD-10-CM

## 2024-03-11 DIAGNOSIS — N3944 Nocturnal enuresis: Secondary | ICD-10-CM | POA: Diagnosis not present

## 2024-03-11 DIAGNOSIS — R202 Paresthesia of skin: Secondary | ICD-10-CM | POA: Diagnosis not present

## 2024-03-11 DIAGNOSIS — M51372 Other intervertebral disc degeneration, lumbosacral region with discogenic back pain and lower extremity pain: Secondary | ICD-10-CM | POA: Diagnosis not present

## 2024-03-11 MED ORDER — GABAPENTIN 100 MG PO CAPS
100.0000 mg | ORAL_CAPSULE | Freq: Three times a day (TID) | ORAL | 1 refills | Status: AC
Start: 2024-03-11 — End: ?

## 2024-03-11 NOTE — Progress Notes (Signed)
 REFERRING PHYSICIAN:  Chandra Toribio POUR, Md [address] [place],  [place] [address]  DOS:  C4-5 arthroplasty, 07/11/2023  Left ultrasound-guided carpal tunnel release, 10/31/2023  HISTORY OF PRESENT ILLNESS: Louis Carney is 8 months status post C4-5 arthroplasty and 4 months status post left ultrasound-guided carpal tunnel release.  From a carpal tunnel standpoint he is doing very well.  He is here today instead to follow-up for his ongoing back pain for which he has been continuously evaluated.  He continues to have pain numbness and tingling going down his right lower extremity.  This continues to worsen.  He has had previous surgery at L5-S1 but continues to feel that he is getting worsening symptoms in his L5 distribution and have having worsening back pain.  He just recently had his first physical therapy visit.  Of concern today is that he has been waking up in the morning and realized that he has urinated on himself overnight.  He also feels as though he is having issues with urine dribbling when he is up and awake which is a change over the last couple of months.  He feels as though his back pain is more constant and continuing to get worse as well.  He has not yet had any physical therapy for this.     PHYSICAL EXAMINATION:  NEUROLOGICAL:  General: In no acute distress.   Awake, alert, oriented to person, place, and time.  Pupils equal round and reactive to light.  Facial tone is symmetric.    Strength: Strength: Side Biceps Triceps Deltoid Interossei Grip Wrist Ext. Wrist Flex.  R 5 4+ 4+ 4+ 4+ 4+ 5  L 5 5 5 4 4 5  4-   Continues to have good strength in his bilateral lower extremities with the exception of a mild right sided foot drop which is chronic.  Incision c/d/I  Imaging:  No recent imaging  Assessment / Plan: Louis Carney is 8 months status post C4-5 arthroplasty and 4 months status post left ultrasound-guided carpal tunnel release.  From a carpal tunnel  standpoint he is doing very well.  He is here today instead to follow-up for his ongoing back pain for which he has been continuously evaluated.  He continues to have pain numbness and tingling going down his right lower extremity.  This continues to worsen.  He has had previous surgery at L5-S1 but continues to feel that he is getting worsening symptoms in his L5 distribution and have having worsening back pain.  He just recently had his first physical therapy visit.  Of concern today is that he has been waking up in the morning and realized that he has urinated on himself overnight.  He also feels as though he is having issues with urine dribbling when he is up and awake which is a change over the last couple of months.  He feels as though his back pain is more constant and continuing to get worse as well.  Plan includes the following:  -He is being considered for direct decompression with a transforaminal lumbar interbody fusion on the right for his ongoing back pain.  He needs to complete 6 weeks of physical therapy.  I have also advised patient that he needs to stop smoking at this time and he would likely undergo nicotine testing before moving forward with a fusion.  -Due to new urologic symptoms as well as new lumbar symptoms, would like repeat MRI of lumbar spine to evaluate for any  new disc herniation to be causing this prior to moving forward with surgery.  -We also like patient to see urology for new mixed incontinence.  See back in 6 weeks with Dr. Claudene after physical therapy is completed to likely book surgery.  Lyle Decamp, PA-C Dept of Neurosurgery

## 2024-03-16 ENCOUNTER — Ambulatory Visit: Admitting: Internal Medicine

## 2024-03-17 ENCOUNTER — Ambulatory Visit: Admitting: Dermatology

## 2024-03-18 ENCOUNTER — Ambulatory Visit
Admission: RE | Admit: 2024-03-18 | Discharge: 2024-03-18 | Disposition: A | Discharge status: Home / Self Care | Source: Ambulatory Visit | Attending: Physician Assistant | Admitting: Physician Assistant

## 2024-03-18 DIAGNOSIS — G8929 Other chronic pain: Secondary | ICD-10-CM

## 2024-03-18 DIAGNOSIS — R278 Other lack of coordination: Secondary | ICD-10-CM | POA: Diagnosis not present

## 2024-03-18 DIAGNOSIS — M5416 Radiculopathy, lumbar region: Secondary | ICD-10-CM

## 2024-03-18 DIAGNOSIS — M51372 Other intervertebral disc degeneration, lumbosacral region with discogenic back pain and lower extremity pain: Secondary | ICD-10-CM | POA: Diagnosis not present

## 2024-03-18 DIAGNOSIS — R262 Difficulty in walking, not elsewhere classified: Secondary | ICD-10-CM | POA: Diagnosis not present

## 2024-03-18 DIAGNOSIS — M47816 Spondylosis without myelopathy or radiculopathy, lumbar region: Secondary | ICD-10-CM | POA: Diagnosis not present

## 2024-03-18 DIAGNOSIS — N3944 Nocturnal enuresis: Secondary | ICD-10-CM

## 2024-03-18 DIAGNOSIS — M48061 Spinal stenosis, lumbar region without neurogenic claudication: Secondary | ICD-10-CM | POA: Diagnosis not present

## 2024-03-23 ENCOUNTER — Ambulatory Visit: Payer: Self-pay | Admitting: Physician Assistant

## 2024-03-23 NOTE — Telephone Encounter (Signed)
 Started PT with [facility]- note in the media section

## 2024-03-25 DIAGNOSIS — M5416 Radiculopathy, lumbar region: Secondary | ICD-10-CM | POA: Diagnosis not present

## 2024-03-25 DIAGNOSIS — R262 Difficulty in walking, not elsewhere classified: Secondary | ICD-10-CM | POA: Diagnosis not present

## 2024-03-25 DIAGNOSIS — R278 Other lack of coordination: Secondary | ICD-10-CM | POA: Diagnosis not present

## 2024-03-25 DIAGNOSIS — M51372 Other intervertebral disc degeneration, lumbosacral region with discogenic back pain and lower extremity pain: Secondary | ICD-10-CM | POA: Diagnosis not present

## 2024-03-30 ENCOUNTER — Telehealth: Payer: Self-pay

## 2024-03-30 NOTE — Telephone Encounter (Signed)
 [facility] pharmacy called and stated patient received Skyrizi  delivery 03/10/24.  Any questions or concerns, the pharmacy can be reached at [phone].

## 2024-03-31 ENCOUNTER — Ambulatory Visit: Admitting: Internal Medicine

## 2024-03-31 ENCOUNTER — Telehealth: Payer: Self-pay

## 2024-03-31 DIAGNOSIS — R278 Other lack of coordination: Secondary | ICD-10-CM | POA: Diagnosis not present

## 2024-03-31 DIAGNOSIS — R262 Difficulty in walking, not elsewhere classified: Secondary | ICD-10-CM | POA: Diagnosis not present

## 2024-03-31 DIAGNOSIS — M51372 Other intervertebral disc degeneration, lumbosacral region with discogenic back pain and lower extremity pain: Secondary | ICD-10-CM | POA: Diagnosis not present

## 2024-03-31 DIAGNOSIS — M5416 Radiculopathy, lumbar region: Secondary | ICD-10-CM | POA: Diagnosis not present

## 2024-03-31 NOTE — Telephone Encounter (Signed)
 Copied from CRM #[id number]. Topic: Referral - Request for Referral >> Mar 31, 2024  9:37 AM Olam RAMAN wrote: Did the patient discuss referral with their provider in the last year? Yes (If No - schedule appointment) (If Yes - send message)  Appointment offered? No  Type of order/referral and detailed reason for visit: new endo that is closer  Preference of office, provider, location: n/a  If referral order, have you been seen by this specialty before? No (If Yes, this issue or another issue? When? Where?  Can we respond through MyChart? Yes

## 2024-04-01 ENCOUNTER — Other Ambulatory Visit: Payer: Self-pay | Admitting: Family Medicine

## 2024-04-01 DIAGNOSIS — Z794 Long term (current) use of insulin: Secondary | ICD-10-CM

## 2024-04-01 NOTE — Telephone Encounter (Signed)
 Does he have a specific place in mind?  I do not see any endocrinologist office that are closer to his home than the one he is currently seeing.

## 2024-04-01 NOTE — Telephone Encounter (Signed)
 Okay I have sent it in to the [facility] in [place].

## 2024-04-07 ENCOUNTER — Other Ambulatory Visit: Payer: Self-pay

## 2024-04-07 DIAGNOSIS — N529 Male erectile dysfunction, unspecified: Secondary | ICD-10-CM

## 2024-04-08 DIAGNOSIS — M51372 Other intervertebral disc degeneration, lumbosacral region with discogenic back pain and lower extremity pain: Secondary | ICD-10-CM | POA: Diagnosis not present

## 2024-04-08 DIAGNOSIS — M5416 Radiculopathy, lumbar region: Secondary | ICD-10-CM | POA: Diagnosis not present

## 2024-04-08 DIAGNOSIS — R262 Difficulty in walking, not elsewhere classified: Secondary | ICD-10-CM | POA: Diagnosis not present

## 2024-04-08 DIAGNOSIS — R278 Other lack of coordination: Secondary | ICD-10-CM | POA: Diagnosis not present

## 2024-04-13 NOTE — Progress Notes (Signed)
 "     Referring Physician:  Chandra Toribio POUR, MD [address] [place],  [place] [address]  Primary Physician:  Chandra Toribio POUR, MD  DOS:  C4-5 arthroplasty, 07/11/2023  Left ultrasound-guided carpal tunnel release, 10/31/2023  Discussed the use of AI scribe software for clinical note transcription with the patient, who gave verbal consent to proceed.  History of Present Illness Louis Carney is a 43 year old male with prior lumbar discectomy who presents for evaluation of chronic right lower back and leg pain with persistent radiculopathy. He has constant burning pain in the lower back and right leg, described as on fire, radiating from the back through the middle and entire right side. The pain fluctuates in intensity but never resolves and is associated with intermittent right leg weakness and dragging, especially with walking and sidestepping. He has difficulty standing in the morning, needing about a minute for the right leg to stabilize before ambulation. He had an L4-5 lumbar discectomy several years ago. Two prior cortisone injections for nerve ablation were ineffective and stopped. Ongoing physical therapy has not improved symptoms and back manipulation can trigger a shock-like sensation. He notes hyperactive lower extremity reflexes and involuntary kicking during foot care, which is complicated by diabetes. He reports significant anxiety and distress about the unpredictability of his leg function in public and feels self-conscious when the leg drags involuntarily.  PHYSICAL EXAMINATION:  NEUROLOGICAL:  General: In no acute distress.   Awake, alert, oriented to person, place, and time.  Pupils equal round and reactive to light.  Facial tone is symmetric.    Strength: Continues to have good strength in his bilateral lower extremities with the exception of a mild right sided foot drop which is chronic.  Incision c/d/I  Imaging:  Narrative & Impression  EXAM: MRI LUMBAR  SPINE 03/18/2024 04:30:34 PM   TECHNIQUE: Multiplanar multisequence MRI of the lumbar spine was performed without the administration of intravenous contrast.   COMPARISON: MR Lumbar Spine without contrast 05/27/2023. Lumbar radiographs 11/05/2007.   CLINICAL HISTORY: 43 year old male with low back pain, lumbar radiculopathy, right leg weakness/numbness, and new urinary/bowel symptoms.   FINDINGS:   BONES AND ALIGNMENT: Normal lumbar segmentation, as designated on the 05/27/2023 MRI. Stable straightening of lumbar lordosis. Stable mild retrolisthesis of L5 on S1. No scoliosis. Normal vertebral body heights. Normal background bone marrow signal. Chronic degenerative L5-S1 endplate spurring and marrow signal changes. Intact visible sacrum and SI joints. No marrow edema.   SPINAL CORD: Normal conus medullaris at L1. No signal abnormality in the visible lower thoracic spinal cord or conus.   SOFT TISSUES: No paraspinal mass.   DEGENERATIVE: T11-T12: Negative.   T12-L1: Subtle right paracentral disc protrusion and annular fissure (series 114 image 2). No stenosis.   L1-L2: Negative.   L2-L3: Negative.   L3-L4: Disc desiccation and disc space loss. Circumferential disc bulge. Moderate facet and ligament flavum hypertrophy. Trace degenerative facet joint fluid. Epidural lipomatosis, although mildly regressed compared to 05/27/2023. Broad based foraminal disc osteophyte complex greater on the right (same image). Mild but improved spinal stenosis. Moderate right L3 neural foraminal stenosis appears stable.   L4-L5: Broad based mostly far lateral disc bulging. Asymmetric broad based left foraminal disc osteophyte complex redemonstrated (series 106 image 12). Epidural lipomatosis, stable to mildly regressed. Moderate facet and ligament flavum hypertrophy. Stable mild spinal stenosis. Stable moderate left L4 neural foraminal stenosis.   L5-S1: Chronically advanced disc  degeneration. Disc space loss with circumferential disc osteophyte  complex. Chronic right laminectomy. Architectural distortion at the right lateral recess. Broad based disc osteophyte complex there, but no convincing disc extrusion. Moderate facet hypertrophy. Epidural lipomatosis. No spinal or convincing lateral recess stenosis now. Moderate left and moderate to severe right L5 neural foraminal stenosis appears stable (series 106 image 4 on the right).   IMPRESSION: 1. Stable lumbar spine degeneration since January. 2. Postoperative changes on the right at L5-S1. Architectural distortion in the right lateral recess. No convincing spinal or lateral recess stenosis there. 3. Stable to mildly improved multifactorial spinal stenosis at both L3-L4 and L4-L5, in part related to epidural lipomatosis. 4. Stable degenerative neural foraminal stenosis at the right L3, left L4, left L5, right L5 (severe) nerve levels.   Electronically signed by: Helayne Hurst MD 03/22/2024 11:47 AM EST RP Workstation: [id number]    Assessment and Plan Assessment & Plan Lumbar radiculopathy Chronic lumbar radiculopathy with intermittent right lower extremity weakness and burning pain, refractory to prior lumbar discectomy and nerve ablation injections. Symptoms persist despite ongoing physical therapy and significantly impair ambulation and daily function. Further interventional pain management is indicated prior to consideration of surgical options. - Referred to pain management for evaluation and possible lumbar injections vs evaluation for SCS given prior laminectomy with continued symptoms. - Instructed him to continue physical therapy as scheduled. - Advised him to report symptom progression or lack of improvement following pain management interventions.  Post-laminectomy syndrome Chronic post-laminectomy syndrome with constant neuropathic pain in the lower back and right lower extremity, likely secondary to  nerve irritation from scar tissue post-lumbar discectomy. Symptoms remain present despite conservative management. Surgical decompression is complicated by scar tissue; lumbar fusion is reserved for refractory cases and requires smoking cessation for optimal bone healing. - Referred to pain management for evaluation of spinal cord stimulator candidacy if injections are ineffective. - Discussed that lumbar fusion may be considered if conservative and interventional pain management fail, with requirement of smoking cessation for at least 60-90 days prior to surgery, has cut down considerably to 1pack per week.  Penne LELON Sharps MD Dept of Neurosurgery   "

## 2024-04-27 ENCOUNTER — Ambulatory Visit: Admitting: Neurosurgery

## 2024-04-27 ENCOUNTER — Encounter: Payer: Self-pay | Admitting: Neurosurgery

## 2024-04-27 VITALS — BP 152/100 | Ht 68.0 in | Wt 209.0 lb

## 2024-04-27 DIAGNOSIS — M5416 Radiculopathy, lumbar region: Secondary | ICD-10-CM

## 2024-04-27 DIAGNOSIS — M961 Postlaminectomy syndrome, not elsewhere classified: Secondary | ICD-10-CM

## 2024-05-11 ENCOUNTER — Other Ambulatory Visit: Payer: Self-pay | Admitting: Orthopedic Surgery

## 2024-05-11 DIAGNOSIS — M12811 Other specific arthropathies, not elsewhere classified, right shoulder: Secondary | ICD-10-CM

## 2024-05-13 ENCOUNTER — Encounter: Payer: Self-pay | Admitting: Dermatology

## 2024-05-13 ENCOUNTER — Ambulatory Visit (INDEPENDENT_AMBULATORY_CARE_PROVIDER_SITE_OTHER): Admitting: Dermatology

## 2024-05-13 DIAGNOSIS — Z7189 Other specified counseling: Secondary | ICD-10-CM

## 2024-05-13 DIAGNOSIS — L409 Psoriasis, unspecified: Secondary | ICD-10-CM

## 2024-05-13 DIAGNOSIS — Z79899 Other long term (current) drug therapy: Secondary | ICD-10-CM

## 2024-05-13 MED ORDER — VTAMA 1 % EX CREA: TOPICAL_CREAM | CUTANEOUS | 5 refills | Status: AC

## 2024-05-13 NOTE — Patient Instructions (Addendum)
 Continue Skyrizi  injections every 12 weeks  Start Vtama  cream twice daily to affected areas of psoriasis  Your prescription was sent to [facility] in [place]. A representative from [facility] will contact you within 3 business hours to verify your address and insurance information to schedule a free delivery. If for any reason you do not receive a phone call from them, please reach out to them. Their phone number is [phone] and their hours are Monday-Friday 9:00 am-5:00 pm.    CXR due 08/2024  Reviewed risks of biologics including immunosuppression, infections (i.e. TB reactivation), injection site reaction, and failure to improve condition. Goal is control of skin condition, not cure.  Some older biologics such as Humira and Enbrel may slightly increase risk of malignancy and may worsen congestive heart failure.  Taltz, Cosentyx, and Bimzelx may cause inflammatory bowel disease to flare or may increase incidence of yeast infections. Skyrizi , Tremfya, and Stelara may also slightly increase risk of infection. The use of biologics requires long term medication management, including periodic office visits, annual TB screening test and monitoring of blood work.      Due to recent changes in healthcare laws, you may see results of your pathology and/or laboratory studies on MyChart before the doctors have had a chance to review them. We understand that in some cases there may be results that are confusing or concerning to you. Please understand that not all results are received at the same time and often the doctors may need to interpret multiple results in order to provide you with the best plan of care or course of treatment. Therefore, we ask that you please give us  2 business days to thoroughly review all your results before contacting the office for clarification. Should we see a critical lab result, you will be contacted sooner.   If You Need Anything After Your Visit  If you  have any questions or concerns for your doctor, please call our main line at [phone] and press option 4 to reach your doctor's medical assistant. If no one answers, please leave a voicemail as directed and we will return your call as soon as possible. Messages left after 4 pm will be answered the following business day.   You may also send us  a message via MyChart. We typically respond to MyChart messages within 1-2 business days.  For prescription refills, please ask your pharmacy to contact our office. Our fax number is [phone].  If you have an urgent issue when the clinic is closed that cannot wait until the next business day, you can page your doctor at the number below.    Please note that while we do our best to be available for urgent issues outside of office hours, we are not available 24/7.   If you have an urgent issue and are unable to reach us , you may choose to seek medical care at your doctor's office, retail clinic, urgent care center, or emergency room.  If you have a medical emergency, please immediately call 911 or go to the emergency department.  Pager Numbers  - Dr. Hester: [phone]  - Dr. Jackquline: [phone]  - Dr. Claudene: [phone]   - Dr. Raymund: [phone]  In the event of inclement weather, please call our main line at [phone] for an update on the status of any delays or closures.  Dermatology Medication Tips: Please keep the boxes that topical medications come in in order to help keep track of the instructions about where and how to  use these. Pharmacies typically print the medication instructions only on the boxes and not directly on the medication tubes.   If your medication is too expensive, please contact our office at [phone] option 4 or send us  a message through MyChart.   We are unable to tell what your co-pay for medications will be in advance as this is different depending on your insurance coverage. However, we may be  able to find a substitute medication at lower cost or fill out paperwork to get insurance to cover a needed medication.   If a prior authorization is required to get your medication covered by your insurance company, please allow us  1-2 business days to complete this process.  Drug prices often vary depending on where the prescription is filled and some pharmacies may offer cheaper prices.  The website www.goodrx.com contains coupons for medications through different pharmacies. The prices here do not account for what the cost may be with help from insurance (it may be cheaper with your insurance), but the website can give you the price if you did not use any insurance.  - You can print the associated coupon and take it with your prescription to the pharmacy.  - You may also stop by our office during regular business hours and pick up a GoodRx coupon card.  - If you need your prescription sent electronically to a different pharmacy, notify our office through [facility] or by phone at [phone] option 4.     Si Usted Necesita Algo Despus de Su Visita  Tambin puede enviarnos un mensaje a travs de Clinical Cytogeneticist. Por lo general respondemos a los mensajes de MyChart en el transcurso de 1 a 2 das hbiles.  Para renovar recetas, por favor pida a su farmacia que se ponga en contacto con nuestra oficina. Randi lakes de fax es [place] [phone].  Si tiene un asunto urgente cuando la clnica est cerrada y que no puede esperar hasta el siguiente da hbil, puede llamar/localizar a su doctor(a) al nmero que aparece a continuacin.   Por favor, tenga en cuenta que aunque hacemos todo lo posible para estar disponibles para asuntos urgentes fuera del horario de [place], no estamos disponibles las 24 horas del da, los 7 [address]  [address] de la [place].   Si tiene un problema urgente y no puede comunicarse con nosotros, puede optar por buscar atencin mdica  en el consultorio de su doctor(a), en una clnica  privada, en un centro de atencin urgente o en una sala de emergencias.  Si tiene engineer, drilling, por favor llame inmediatamente al 911 o vaya a la sala de emergencias.  Nmeros de bper  - Dr. Hester: [phone]  - Dra. Jackquline: [phone]  - Dr. Claudene: [phone]  - Dra. Kitts: [phone]  En caso de inclemencias del [place], por favor llame a nuestra lnea principal al [phone] para una actualizacin sobre el estado de cualquier retraso o cierre.  Consejos para la medicacin en dermatologa: Por favor, guarde las cajas en las que vienen los medicamentos de uso tpico para ayudarle a seguir las instrucciones sobre dnde y cmo usarlos. Las farmacias generalmente imprimen las instrucciones del medicamento slo en las cajas y no directamente en los tubos del [place].   Si su medicamento es muy caro, por favor, pngase en contacto con landry rieger llamando al [phone] y presione la opcin 4 o envenos un mensaje a travs de Clinical Cytogeneticist.   No podemos decirle cul ser su copago por los medicamentos por adelantado ya que  esto es diferente dependiendo de la cobertura de su seguro. Sin embargo, es posible que podamos encontrar un medicamento sustituto a audiological scientist un formulario para que el seguro cubra el medicamento que se considera necesario.   Si se requiere una autorizacin previa para que su compaa de seguros cubra su medicamento, por favor permtanos de 1 a 2 das hbiles para completar este proceso.  Los precios de los medicamentos varan con frecuencia dependiendo del environmental consultant de dnde se surte la receta y alguna farmacias pueden ofrecer precios ms baratos.  El sitio web www.goodrx.com tiene cupones para medicamentos de health and safety inspector. Los precios aqu no tienen en cuenta lo que podra costar con la ayuda del seguro (puede ser ms barato con su seguro), pero el sitio web puede darle el precio si no utiliz tourist information centre manager.  - Puede imprimir el  cupn correspondiente y llevarlo con su receta a la farmacia.  - Tambin puede pasar por nuestra oficina durante el horario de atencin regular y education officer, museum una tarjeta de cupones de GoodRx.  - Si necesita que su receta se enve electrnicamente a una farmacia diferente, informe a nuestra oficina a travs de MyChart de [place] o por telfono llamando al [phone] y presione la opcin 4.

## 2024-05-13 NOTE — Progress Notes (Signed)
" ° °  Follow-Up Visit   Subjective  Louis Carney is a 44 y.o. male who presents for the following: Psoriasis. On Skyrizi . Tolerating well. Denies side affects, injection site reactions or adverse reactions. Would like different topical, clobetasol  is not working as well.   The following portions of the chart were reviewed this encounter and updated as appropriate: medications, allergies, medical history  Review of Systems:  No other skin or systemic complaints except as noted in HPI or Assessment and Plan.  Objective  Well appearing patient in no apparent distress; mood and affect are within normal limits.  Areas Examined: Back, arms  Relevant exam findings are noted in the Assessment and Plan.      Assessment & Plan     PSORIASIS on systemic treatment Patient is very pleased with treatment.  He states has been a life saver for him. Hypertrophic plaque on the sacral area .  Skin psoriasis is 6% BSA on Skyrizi . By history BSA >20% before treatment with Skyrizi .  Chronic and persistent condition with duration or expected duration over one year. Condition is symptomatic/ bothersome to patient. Not currently at goal.  Counseling and coordination of care for severe psoriasis on systemic treatment  Psoriasis - severe on systemic treatment.  Psoriasis is a chronic non-curable, but treatable genetic/hereditary disease that may have other systemic features affecting other organ systems such as joints (Psoriatic Arthritis).  It is linked with heart disease, inflammatory bowel disease, non-alcoholic fatty liver disease, and depression. Significant skin psoriasis and/or psoriatic arthritis may have significant symptoms and affects activities of daily activity and often benefits from systemic treatments.  These systemic treatments have some potential side effects including immunosuppression and require pre-treatment laboratory screening and periodic laboratory monitoring and periodic in person  evaluation and monitoring by the attending dermatologist physician (long term medication management).   Treatment Plan: Continue Skyrizi  injections every 12 weeks Start Vtama  cream twice daily to affected areas of psoriasis  CXR due 08/2024   Vtama  x3 samples given to apply twice a day  Lot: [id number] Exp: 06/2026  Reviewed risks of biologics including immunosuppression, infections (i.e. TB reactivation), injection site reaction, and failure to improve condition. Goal is control of skin condition, not cure.  Some older biologics such as Humira and Enbrel may slightly increase risk of malignancy and may worsen congestive heart failure.  Taltz, Cosentyx, and Bimzelx may cause inflammatory bowel disease to flare or may increase incidence of yeast infections. Skyrizi , Tremfya, and Stelara may also slightly increase risk of infection. The use of biologics requires long term medication management, including periodic office visits, annual TB screening test and monitoring of blood work.   Long term medication management.  Patient is using long term (months to years) prescription medication  to control their dermatologic condition.  These medications require periodic monitoring to evaluate for efficacy and side effects and may require periodic laboratory monitoring.   Return in about 6 months (around 11/10/2024) for Psoriasis Follow Up.  I, Kate Fought, CMA, am acting as scribe for Alm Rhyme, MD.   Documentation: I have reviewed the above documentation for accuracy and completeness, and I agree with the above.  Alm Rhyme, MD  "

## 2024-05-14 ENCOUNTER — Other Ambulatory Visit: Payer: Self-pay

## 2024-05-14 DIAGNOSIS — L400 Psoriasis vulgaris: Secondary | ICD-10-CM

## 2024-05-14 MED ORDER — SKYRIZI PEN 150 MG/ML ~~LOC~~ SOAJ: SUBCUTANEOUS | 2 refills | Status: AC

## 2024-05-27 ENCOUNTER — Ambulatory Visit (HOSPITAL_BASED_OUTPATIENT_CLINIC_OR_DEPARTMENT_OTHER): Admitting: Pain Medicine

## 2024-05-27 DIAGNOSIS — Z79899 Other long term (current) drug therapy: Secondary | ICD-10-CM | POA: Insufficient documentation

## 2024-05-27 DIAGNOSIS — M19011 Primary osteoarthritis, right shoulder: Secondary | ICD-10-CM | POA: Insufficient documentation

## 2024-05-27 DIAGNOSIS — G8929 Other chronic pain: Secondary | ICD-10-CM

## 2024-05-27 DIAGNOSIS — M961 Postlaminectomy syndrome, not elsewhere classified: Secondary | ICD-10-CM | POA: Insufficient documentation

## 2024-05-27 DIAGNOSIS — R937 Abnormal findings on diagnostic imaging of other parts of musculoskeletal system: Secondary | ICD-10-CM | POA: Insufficient documentation

## 2024-05-27 DIAGNOSIS — Z91199 Patient's noncompliance with other medical treatment and regimen due to unspecified reason: Secondary | ICD-10-CM

## 2024-05-27 DIAGNOSIS — R94130 Abnormal response to nerve stimulation, unspecified: Secondary | ICD-10-CM | POA: Insufficient documentation

## 2024-05-27 DIAGNOSIS — R936 Abnormal findings on diagnostic imaging of limbs: Secondary | ICD-10-CM | POA: Insufficient documentation

## 2024-05-27 DIAGNOSIS — Z789 Other specified health status: Secondary | ICD-10-CM | POA: Insufficient documentation

## 2024-05-27 DIAGNOSIS — M899 Disorder of bone, unspecified: Secondary | ICD-10-CM | POA: Insufficient documentation

## 2024-05-27 DIAGNOSIS — G894 Chronic pain syndrome: Secondary | ICD-10-CM | POA: Insufficient documentation

## 2024-05-27 DIAGNOSIS — M12811 Other specific arthropathies, not elsewhere classified, right shoulder: Secondary | ICD-10-CM | POA: Insufficient documentation

## 2024-05-27 NOTE — Progress Notes (Signed)
 Department: [place] [facility] at [facility]) Date: 05/27/2024  Event: Canceled/Rescheduled by patient.  Encounter Type: (NP) New patient evaluation. Initial evaluation visit. Advance notice: Less than 24 hr notice.  Reason: Weather-related..          Note: Information below collected during precharting review of records.                 Patient will R/S  Historic Controlled Substance Pharmacotherapy Review PMP and historical list of controlled substances: Gabapentin  300 mg capsule (90/month) (last filled on 03/01/2024); pregabalin  25 mg capsule (60/month) (last filled on 01/21/2024); hydrocodone /APAP 5/325, 1 tab p.o. 4 times daily (# 28) (last filled on 10/31/2023); oxycodone  IR 5 mg tablet, 1 tab p.o. 4 times daily (# 30) (last filled on 07/24/2023) Most recently prescribed controlled substance(s): Opioid Analgesic: None MME/day: 0 mg/day  Historical Monitoring: The patient  reports no history of drug use. List of prior UDS Testing: No results found for: MDMA, COCAINSCRNUR, PCPSCRNUR, PCPQUANT, CANNABQUANT, THCU, ETH, CBDTHCR, D8THCCBX, D9THCCBX Historical Background Evaluation: [place] PMP: PDMP reviewed during this encounter. Review of the past 19-months conducted.             PMP NARX Score Report:  Narcotic: 150 Sedative: 120 Stimulant: 000 [place] Department of public safety, offender search: Engineer, [employer] Information) Non-contributory Risk Assessment Profile: PMP NARX Overdose Risk Score: 380  Imaging Review  Cervical Imaging: Cervical MR wo contrast: Results for orders placed during the hospital encounter of 05/27/23 MR CERVICAL SPINE WO CONTRAST  Narrative CLINICAL DATA:  Initial evaluation for acute myelopathy. Neck and lower back pain radiating into arms and legs with tingling. Prior surgery.  EXAM: MRI CERVICAL SPINE WITHOUT CONTRAST  TECHNIQUE: Multiplanar, multisequence MR imaging of the  cervical spine was performed. No intravenous contrast was administered.  COMPARISON:  Prior radiograph from 05/24/2023 as well as previous MRI from 10/09/2017.  FINDINGS: Alignment: Straightening of the normal cervical lordosis. No listhesis or malalignment.  Vertebrae: Susceptibility artifact related to prior interbody fusion at C5-6. Vertebral body height maintained without acute or chronic fracture. Bone marrow signal intensity within normal limits. No worrisome osseous lesions or abnormal marrow edema.  Cord: Focal cord signal abnormality with cord atrophy noted at the level of C5-6, consistent with chronic myelomalacia (series 5, image 8). Otherwise normal signal and morphology.  Posterior Fossa, vertebral arteries, paraspinal tissues: Mild patchy signal abnormality within the visualized pons, most likely related to chronic microvascular ischemic disease. Craniocervical junction within normal limits. Paraspinous soft tissues within normal limits. Normal flow voids seen within the vertebral arteries bilaterally.  Disc levels:  C2-C3: Negative interspace. Mild bilateral facet spurring. No canal or foraminal stenosis.  C3-C4: Mild uncovertebral spurring without significant disc bulge. No spinal stenosis. Foramina remain patent.  C4-C5: Lobulated broad-based central to right paracentral disc protrusion flattens and indents the ventral thecal sac (series 8, image 15). Associated superior and inferior migration of disc material. Flattening of the ventral cord without cord signal changes, worse on the right. Mild spinal stenosis. Superimposed uncovertebral spurring with resultant mild to moderate right worse than left C5 foraminal stenosis.  C5-C6: Prior fusion. Level is somewhat obscured by susceptibility artifact. No visible significant residual spinal stenosis. Foramina appear grossly patent.  C6-C7: Degenerative intervertebral disc space narrowing with diffuse disc  bulge and bilateral uncovertebral spurring. No spinal stenosis. Mild left C7 foraminal narrowing. Right neural foramen remains patent.  C7-T1: Normal interspace. Mild right greater than left facet hypertrophy. No spinal  stenosis. Mild right greater than left C8 foraminal narrowing.  IMPRESSION: 1. Focal cord signal abnormality with cord atrophy at the level of C5-6, consistent with chronic myelomalacia. 2. Broad-based central to right paracentral disc protrusion at C4-5 with resultant mild spinal stenosis, with mild to moderate right worse than left C5 foraminal narrowing. 3. Prior interbody fusion at C5-6 without residual or recurrent stenosis. 4. Degenerative disc bulging with uncovertebral spurring at C6-7 with resultant mild left C7 foraminal stenosis.   Electronically Signed By: Morene Hoard M.D. On: 06/04/2023 23:54  Cervical CT w contrast: Results for orders placed during the hospital encounter of 06/14/23 CT CERVICAL SPINE W CONTRAST  Narrative CLINICAL DATA:  Patient with a history of cervical spondylosis with myelopathy. Patient reports he has recently been experiencing numbness in his hands and feet  EXAM: CERVICAL MYELOGRAM  CT CERVICAL MYELOGRAM  FLUOROSCOPY: Radiation exposure index as provided by the fluoroscopic device: 68.10 mGy  PROCEDURE: LUMBAR PUNCTURE FOR CERVICAL MYELOGRAM  After thorough discussion of risks and benefits of the procedure including bleeding, infection, injury to nerves, blood vessels, adjacent structures as well as headache and CSF leak, written and oral informed consent was obtained. Consent was obtained by Warren Dais, NP.  Patient was positioned prone on the fluoroscopy table. Local anesthesia was provided with 1% lidocaine  without epinephrine  after prepped and draped in the usual sterile fashion. Puncture was performed at L2-L3 using a 3 1/2 inch 22-gauge spinal needle. Using a single pass through the dura,  the needle was placed within the thecal sac, with return of clear CSF. 10 mL of Isovue M-300 was injected into the thecal sac, with normal opacification of the nerve roots and cauda equina consistent with free flow within the subarachnoid space. The patient was then moved to the trendelenburg position and contrast flowed into the cervical spine region.  I personally performed the lumbar puncture and administered the intrathecal contrast. I also personally supervised acquisition of the myelogram images.  TECHNIQUE: Contiguous axial images were obtained through the cervical spine after the intrathecal infusion of contrast. Coronal and sagittal reconstructions were obtained of the axial image sets.  FINDINGS: CERVICAL MYELOGRAM FINDINGS:  Technically successful fluoroscopy guided cervical myelogram myelogram.Intra thecal contrast was visualized to the level of the mid thoracic spine.  CT CERVICAL MYELOGRAM FINDINGS:  Alignment: Straightening of the normal cervical lordosis  Skull base and vertebrae: No acute fracture. No primary bone lesion or focal pathologic process. Interbody spacer at C5-C6.  Soft tissues and spinal canal: No prevertebral fluid or swelling. No visible canal hematoma.  Disc levels:  C1-C2: No significant degenerative change. No significant spinal canal stenosis at this level.  C2-C3: Preserved disc space. No significant facet degenerative change. No spinal canal stenosis. No neural foraminal stenosis.  C3-C4: No significant disc bulge. No facet degenerative change. No spinal canal stenosis. No neural foraminal stenosis.  C4-C5: Junctional level. There is a circumferential disc bulge with superimposed central disc protrusion and also a right foraminal disc protrusion (series 3, image 52). There is mild deformation of the ventral spinal cord, mild spinal canal narrowing, and severe right neural foraminal narrowing. Findings are new/progressed from  10/09/17 MRI C Spine  C5-C6: Fused level. No spinal canal narrowing neural foraminal narrowing. Mild bilateral facet degenerative change  C6-C7: No significant disc bulge. No facet degenerative change. No spinal canal stenosis. No neural foraminal stenosis.  C7-T1: No significant disc bulge. No facet degenerative change. No spinal canal stenosis. No neural foraminal stenosis.  Upper  chest: Not imaged  Other: None.  IMPRESSION: 1. Technically successful fluoroscopy guided cervical myelogram. 2. At C4-C5, there is a circumferential disc bulge with superimposed central disc protrusion and also a right foraminal disc protrusion. There is mild deformation of the ventral spinal cord, mild spinal canal narrowing, and severe right neural foraminal narrowing. Findings are new/progressed from 10/09/17   Electronically Signed By: Lyndall Gore M.D. On: 06/14/2023 12:16  Cervical DG 2-3 views: Results for orders placed during the hospital encounter of 09/25/23 DG Cervical Spine 2 or 3 views  Narrative CLINICAL DATA:  Status post fusion  EXAM: CERVICAL SPINE - 2-3 VIEW  COMPARISON:  Sep 25, 2023  FINDINGS: Status post C4-C5 C5-C6 disc arthroplasty, with near anatomic alignment. There is minimal narrowing of the C6-7 disc space.  Spinolaminar line is intact  Prevertebral soft tissues are normal  C1-C2 articulation and predental space are normal  IMPRESSION: Status post C4-C5 C5-C6 disc arthroplasty, with near anatomic alignment.   Electronically Signed By: Franky Chard M.D. On: 10/03/2023 09:46  Cervical DG complete: Results for orders placed during the hospital encounter of 05/24/23 DG Cervical Spine Complete  Narrative CLINICAL DATA:  Recent numbness in the upper extremity. Previous anterior cervical disc fusion.  EXAM: CERVICAL SPINE - COMPLETE 4+ VIEW  COMPARISON:  11/04/2017.  FINDINGS: Artificial disc at the C5-C6 level appears well position in  seated and unchanged.  No fracture or bone lesion. No spondylolisthesis. Minor loss of disc height with endplate osteophytes at C4-C5 and C6-C7.  No subluxation with flexion or extension. No movement of the orthopedic hardware.  Bilateral carotid artery calcifications. Soft tissues are otherwise unremarkable.  IMPRESSION: 1. No fracture or acute finding. No spondylolisthesis. No subluxation with flexion or extension. 2. Stable appearance of the prosthetic disc at the C5-C6 level.   Electronically Signed By: Alm Parkins M.D. On: 05/30/2023 08:39  Shoulder Imaging: Shoulder-R MR wo contrast: Results for orders placed during the hospital encounter of 03/04/24 MR SHOULDER RIGHT WO CONTRAST  Narrative MR SHOULDER WITHOUT IV CONTRAST RIGHT  COMPARISON: X-ray 02/24/2024  CLINICAL HISTORY: Shoulder pain, chronic.  PULSE SEQUENCES: Ax PD FS, Sag T2 FS, Cor T1 & COR T2 FS  FINDINGS:  Bones: There is moderate AC joint arthrosis with hypertrophy and reactive edema in the [facility] joint. There is superior migration of the humeral head consistent with chronic rotator cuff injury. Small glenohumeral joint effusion is present. Is no fracture.  Rotator cuff: Significant narrowing of the supraspinatus outlet. There is a full-thickness retracted supraspinatus and infraspinatus tendon tear with severe fatty atrophy of the muscles. This is consistent with chronic rotator cuff pathology. There is insertional tendinosis of the subscapularis tendon. There is fatty atrophy of the teres minor muscle which may be related to chronic quadrilateral space syndrome. The teres minor tendon is grossly intact.  Labrum and biceps tendon: There is moderate tendinosis of the intra-articular segment of biceps tendon. There is tenosynovitis in the bicipital groove. Correlation for biceps tendon symptoms. There is blunting of the superior and posterior labrum. The labrum is poorly evaluated secondary to  superior migration and mild motion. There is no displaced labral tear appreciated.   IMPRESSION: Moderate to severe AC joint arthrosis. Superior migration of the humeral head with significant narrowing of the supraspinatus outlet secondary to chronic rotator cuff pathology.  Chronic full-thickness retracted supraspinatus and infraspinatus tendon tears with severe fatty atrophy of the muscles. There is insertional tendinosis of the subscapularis tendon. Likely quadrilateral space syndrome  is seen in the tear is minor muscle fatty atrophy.  Significant tendinosis and a probable partial tear of the intra-articular segment of biceps tendon.  Blunting of the labrum without a discrete labral tear.  Electronically signed by: Norleen Satchel MD 03/05/2024 02:52 PM EST RP Workstation: [id number]  [place] DG: Results for orders placed in visit on 02/24/24 DG Shoulder Right  Narrative EXAM: 1 VIEW XRAY OF THE RIGHT SHOULDER 02/24/2024 02:41:19 PM  COMPARISON: None available.  CLINICAL HISTORY: pain. Pt reports hx of cervical fusion. Having pain in right shoulder. Unable to lift arm without assistance with other hand.  FINDINGS:  BONES AND JOINTS: Glenohumeral joint is normally aligned. No acute fracture or dislocation. Mild glenohumeral joint degenerative changes. Moderate acromioclavicular joint degenerative changes.  SOFT TISSUES: No abnormal calcifications. Visualized lung is unremarkable.  IMPRESSION: 1. No acute osseous abnormality.  Electronically signed by: Morgane Naveau MD 02/26/2024 12:18 AM EDT RP Workstation: [id number]  Lumbosacral Imaging: Lumbar MR wo contrast: Results for orders placed during the hospital encounter of 03/18/24 MR LUMBAR SPINE WO CONTRAST  Narrative EXAM: MRI LUMBAR SPINE 03/18/2024 04:30:34 PM  TECHNIQUE: Multiplanar multisequence MRI of the lumbar spine was performed without the administration of intravenous  contrast.  COMPARISON: MR Lumbar Spine without contrast 05/27/2023. Lumbar radiographs 11/05/2007.  CLINICAL HISTORY: 43 year old male with low back pain, lumbar radiculopathy, right leg weakness/numbness, and new urinary/bowel symptoms.  FINDINGS:  BONES AND ALIGNMENT: Normal lumbar segmentation, as designated on the 05/27/2023 MRI. Stable straightening of lumbar lordosis. Stable mild retrolisthesis of L5 on S1. No scoliosis. Normal vertebral body heights. Normal background bone marrow signal. Chronic degenerative L5-S1 endplate spurring and marrow signal changes. Intact visible sacrum and SI joints. No marrow edema.  SPINAL CORD: Normal conus medullaris at L1. No signal abnormality in the visible lower thoracic spinal cord or conus.  SOFT TISSUES: No paraspinal mass.  DEGENERATIVE: T11-T12: Negative.  T12-L1: Subtle right paracentral disc protrusion and annular fissure (series 114 image 2). No stenosis.  L1-L2: Negative.  L2-L3: Negative.  L3-L4: Disc desiccation and disc space loss. Circumferential disc bulge. Moderate facet and ligament flavum hypertrophy. Trace degenerative facet joint fluid. Epidural lipomatosis, although mildly regressed compared to 05/27/2023. Broad based foraminal disc osteophyte complex greater on the right (same image). Mild but improved spinal stenosis. Moderate right L3 neural foraminal stenosis appears stable.  L4-L5: Broad based mostly far lateral disc bulging. Asymmetric broad based left foraminal disc osteophyte complex redemonstrated (series 106 image 12). Epidural lipomatosis, stable to mildly regressed. Moderate facet and ligament flavum hypertrophy. Stable mild spinal stenosis. Stable moderate left L4 neural foraminal stenosis.  L5-S1: Chronically advanced disc degeneration. Disc space loss with circumferential disc osteophyte complex. Chronic right laminectomy. Architectural distortion at the right lateral recess. Broad  based disc osteophyte complex there, but no convincing disc extrusion. Moderate facet hypertrophy. Epidural lipomatosis. No spinal or convincing lateral recess stenosis now. Moderate left and moderate to severe right L5 neural foraminal stenosis appears stable (series 106 image 4 on the right).  IMPRESSION: 1. Stable lumbar spine degeneration since January. 2. Postoperative changes on the right at L5-S1. Architectural distortion in the right lateral recess. No convincing spinal or lateral recess stenosis there. 3. Stable to mildly improved multifactorial spinal stenosis at both L3-L4 and L4-L5, in part related to epidural lipomatosis. 4. Stable degenerative neural foraminal stenosis at the right L3, left L4, left L5, right L5 (severe) nerve levels.  Electronically signed by: Helayne Hurst MD 03/22/2024 11:47 AM EST RP Workstation: [id number]  Lumbar MR w/wo contrast: Results for orders placed during the hospital encounter of 05/15/18 MR Lumbar Spine W Wo Contrast  Narrative CLINICAL DATA:  44 year old male status post lumbar surgery on 03/07/2018. Pain radiating to the right leg with weakness.  Creatinine was obtained on site at [facility] at [address]  Results: Creatinine 0.7 mg/dL.  EXAM: MRI LUMBAR SPINE WITHOUT AND WITH CONTRAST  TECHNIQUE: Multiplanar and multiecho pulse sequences of the lumbar spine were obtained without and with intravenous contrast.  CONTRAST:  20mL MULTIHANCE  GADOBENATE DIMEGLUMINE  529 MG/ML IV SOLN  COMPARISON:  Intraoperative lumbar images [id number]. Lumbar radiographs 11/04/2017. Lumbar MRI 10/09/2017.  FINDINGS: Segmentation:  Normal as designated on the 2019 MRI.  Alignment:  Stable.  Subtle retrolisthesis of L3 on L4 and L5 on S1.  Vertebrae: Persistent degenerative appearing marrow edema at the L5-S1 endplates eccentric to the right. Postoperative changes at that level detailed below. Underlying bone marrow signal  remains normal. No new osseous abnormality identified. Intact visible sacrum and SI joints.  Conus medullaris and cauda equina: Conus extends to the L1 level. No lower spinal cord or conus signal abnormality. There is smooth linear enhancement of the left L5 nerve roots in the cauda equina as seen on series 17, image 31. No other abnormal intradural enhancement. There is mild postoperative appearing dural thickening and enhancement at L5-S1.  Paraspinal and other soft tissues: Soft tissues at L5-S1 with no postoperative fluid collection. Negative visible abdominal viscera.  Disc levels:  Stable and negative aside from some epidural lipomatosis from the visible lower thoracic spine to L2-L3.  L3-L4: Stable circumferential disc bulge and mild posterior element hypertrophy. Epidural lipomatosis here has mildly progressed along with mild-to-moderate spinal stenosis when compared to the prior MRI (series 12, image 26).  L4-L5: Increased epidural lipomatosis and subsequent mild spinal stenosis (series 12, image 33). Mild to moderate facet and ligament flavum hypertrophy is stable.  L5-S1: Interval right laminectomy. There is a rim enhancing right paracentral disc extrusion best demonstrated on series 17, image 39 measuring 10-11 mm diameter. This is also seen on series 13 image 8 and series 12, image 39. This is superimposed on epidural lipomatosis and mild to moderate facet hypertrophy. Subsequent severe right lateral recess stenosis at the right S1 nerve level. Increased multifactorial spinal stenosis compared to the prior MRI. Mild to moderate bilateral L5 foraminal stenosis appears not significantly changed.  IMPRESSION: 1. Interval right side surgery at L5-S1 with 11 mm right paracentral rim enhancing disc extrusion contributing to severe right lateral recess and increased multifactorial spinal stenosis compared to the 2019 MRI. Query right S1 radiculitis. Moderate right  greater than left L5 foraminal stenosis appears not significantly changed. 2. Increased epidural lipomatosis and associated spinal stenosis since 2019 at L3-L4 and L4-L5. 3. Smooth enhancement of the left L5 nerve roots in the lower cauda equina appears inflammatory but is of unclear significance.   Electronically Signed By: VEAR Hurst M.D. On: 05/15/2018 20:34  Lumbar DG (Complete) 4+V: Results for orders placed during the hospital encounter of 05/24/23 DG Lumbar Spine Complete  Narrative CLINICAL DATA:  Low back pain.  EXAM: LUMBAR SPINE - COMPLETE 4+ VIEW  COMPARISON:  03/07/2018.  FINDINGS: Normal vertebral body stature and alignment. No fracture or bone lesion.  Mild loss of disc height at L3-L4. Remaining disc spaces well preserved. Small endplate osteophytes at L3-L4 and L5-S1.  No subluxation with flexion or extension.  IMPRESSION: 1. No fracture.  Normal alignment. 2. Mild  disc degenerative changes at L3-L4.   Electronically Signed By: Alm Parkins M.D. On: 05/30/2023 08:44  Ankle Imaging: Ankle-L DG Complete: Results for orders placed during the hospital encounter of 12/24/19 DG Ankle Complete Left  Narrative CLINICAL DATA:  Ankle pain and swelling  EXAM: LEFT ANKLE COMPLETE - 3+ VIEW  COMPARISON:  None.  FINDINGS: No fracture or malalignment. Ankle mortise is symmetric. Generalized soft tissue swelling. Mild degenerative spurring medially and laterally.  IMPRESSION: No acute osseous abnormality.   Electronically Signed By: Luke Bun M.D. On: 12/24/2019 17:23  Complexity Note: Imaging results reviewed.                         Allergies  Mr. Breden is allergic to methocarbamol .  Laboratory Chemistry Profile   Renal Lab Results  Component Value Date   BUN 11 02/10/2024   CREATININE 0.73 (L) 02/10/2024   LABCREA 178 09/09/2023   BCR 15 02/10/2024   GFR 109.19 09/06/2022   GFRAA >60 12/25/2019   GFRNONAA >60 10/24/2023      Electrolytes Lab Results  Component Value Date   NA 137 02/10/2024   K 4.7 02/10/2024   CL 98 02/10/2024   CALCIUM  9.5 02/10/2024   MG 2.3 10/27/2018   PHOS 4.1 10/27/2018     Hepatic Lab Results  Component Value Date   AST 21 02/10/2024   ALT 32 02/10/2024   ALBUMIN 4.4 02/10/2024   ALKPHOS 61 02/10/2024   AMYLASE 27 (L) 10/27/2018   LIPASE 41 10/27/2018     ID Lab Results  Component Value Date   HIV NON-REACTIVE 11/29/2017   SARSCOV2NAA NEGATIVE 12/10/2022   STAPHAUREUS NEGATIVE 07/03/2023   MRSAPCR NEGATIVE 07/03/2023     Bone Lab Results  Component Value Date   VD25OH 13.1 (L) 07/28/2018   TESTOSTERONE  385 01/15/2023     Endocrine Lab Results  Component Value Date   GLUCOSE 140 (H) 02/10/2024   HGBA1C 6.2 (H) 12/03/2023   TSH 1.180 01/15/2023   FREET4 1.15 10/27/2018   TESTOSTERONE  385 01/15/2023     Neuropathy Lab Results  Component Value Date   VITAMINB12 861 03/06/2018   FOLATE 5.7 08/26/2017   HGBA1C 6.2 (H) 12/03/2023   HIV NON-REACTIVE 11/29/2017     CNS No results found for: COLORCSF, APPEARCSF, RBCCOUNTCSF, WBCCSF, POLYSCSF, LYMPHSCSF, EOSCSF, PROTEINCSF, GLUCCSF, JCVIRUS, CSFOLI, IGGCSF, LABACHR, ACETBL   Inflammation (CRP: Acute  ESR: Chronic) No results found for: CRP, ESRSEDRATE, LATICACIDVEN   Rheumatology Lab Results  Component Value Date   ANA NEGATIVE 01/20/2019   LABURIC 8.8 (H) 12/25/2019     Coagulation Lab Results  Component Value Date   PLT 304 12/03/2023     Cardiovascular Lab Results  Component Value Date   HGB 16.7 12/03/2023   HCT 49.0 12/03/2023     Screening Lab Results  Component Value Date   SARSCOV2NAA NEGATIVE 12/10/2022   STAPHAUREUS NEGATIVE 07/03/2023   MRSAPCR NEGATIVE 07/03/2023   HIV NON-REACTIVE 11/29/2017     Cancer No results found for: CEA, CA125, LABCA2   Allergens No results found for: ALMOND, APPLE, ASPARAGUS, AVOCADO, BANANA,  BARLEY, BASIL, BAYLEAF, GREENBEAN, LIMABEAN, WHITEBEAN, BEEFIGE, REDBEET, BLUEBERRY, BROCCOLI, CABBAGE, MELON, CARROT, CASEIN, CASHEWNUT, CAULIFLOWER, CELERY     Note: Lab results reviewed.    Interventional Therapies  Risk Factors  Considerations  Medical Comorbidities:  Allergy : Robaxin  (Methocarbamol )   ETOH Abuse  alcoholic hepatitis  NIDDM  tobacco abuse  latent lung TB  Planned  Pending:      Under consideration:   Pending   Completed: (Analgesic benefit)1  None at this time   Therapeutic  Palliative (PRN) options:   None established   Completed by other providers:   Surgery: Left carpal tunnel release Clayborn Sharps, MD) (10/31/2023)  Surgery: C4-5 arthroplasty Clayborn Sharps, MD) (07/11/2023)  Surgery: Right L5-S1 microdiscectomy Moses Peru, MD) (03/07/2018)  Surgery: Right carpal tunnel release Moses Peru, MD) (01/15/2018)   1(Analgesic benefit): Expressed in percentage (%). (Local anesthetic[LA] +/- sedation  L.A.Local Anesthetic  Steroid benefit  Ongoing benefit)    Note by: Eric DELENA Como, MD (TTS and AI technology used. I apologize for any typographical errors that were not detected and corrected.) Date: 05/27/2024; Time: 8:27 AM

## 2024-05-27 NOTE — Patient Instructions (Signed)

## 2024-06-01 ENCOUNTER — Ambulatory Visit: Admitting: Urology

## 2024-06-05 ENCOUNTER — Encounter: Payer: Self-pay | Admitting: Urology

## 2024-06-10 ENCOUNTER — Ambulatory Visit: Admitting: Family Medicine

## 2024-06-15 ENCOUNTER — Ambulatory Visit: Admitting: Pain Medicine

## 2024-08-10 ENCOUNTER — Ambulatory Visit: Admitting: Urology

## 2024-09-10 ENCOUNTER — Ambulatory Visit

## 2024-11-10 ENCOUNTER — Ambulatory Visit: Admitting: Dermatology
